# Patient Record
Sex: Female | Born: 1950
Health system: Southern US, Community
[De-identification: ages and names within clinical notes are randomized; demographics above are authoritative.]

## PROBLEM LIST (undated history)

## (undated) DIAGNOSIS — Z8619 Personal history of other infectious and parasitic diseases: Secondary | ICD-10-CM

## (undated) DIAGNOSIS — M1990 Unspecified osteoarthritis, unspecified site: Secondary | ICD-10-CM

## (undated) DIAGNOSIS — Z972 Presence of dental prosthetic device (complete) (partial): Secondary | ICD-10-CM

## (undated) DIAGNOSIS — J40 Bronchitis, not specified as acute or chronic: Secondary | ICD-10-CM

## (undated) DIAGNOSIS — F419 Anxiety disorder, unspecified: Secondary | ICD-10-CM

## (undated) DIAGNOSIS — R7982 Elevated C-reactive protein (CRP): Secondary | ICD-10-CM

## (undated) DIAGNOSIS — E785 Hyperlipidemia, unspecified: Secondary | ICD-10-CM

## (undated) DIAGNOSIS — I10 Essential (primary) hypertension: Secondary | ICD-10-CM

## (undated) DIAGNOSIS — M792 Neuralgia and neuritis, unspecified: Secondary | ICD-10-CM

## (undated) DIAGNOSIS — J45909 Unspecified asthma, uncomplicated: Secondary | ICD-10-CM

## (undated) DIAGNOSIS — B029 Zoster without complications: Secondary | ICD-10-CM

## (undated) DIAGNOSIS — K219 Gastro-esophageal reflux disease without esophagitis: Secondary | ICD-10-CM

## (undated) DIAGNOSIS — E119 Type 2 diabetes mellitus without complications: Secondary | ICD-10-CM

## (undated) HISTORY — DX: Elevated C-reactive protein (CRP): R79.82

## (undated) HISTORY — DX: Bronchitis, not specified as acute or chronic: J40

## (undated) HISTORY — PX: CHOLECYSTECTOMY: SHX55

## (undated) HISTORY — PX: ABDOMINAL HYSTERECTOMY: SHX81

---

## 2001-05-31 ENCOUNTER — Other Ambulatory Visit: Admission: RE | Admit: 2001-05-31 | Discharge: 2001-05-31 | Payer: Self-pay | Admitting: Family Medicine

## 2005-10-17 ENCOUNTER — Ambulatory Visit: Payer: Self-pay | Admitting: General Surgery

## 2005-10-17 DIAGNOSIS — Z86010 Personal history of colonic polyps: Secondary | ICD-10-CM | POA: Insufficient documentation

## 2005-10-30 ENCOUNTER — Ambulatory Visit: Payer: Self-pay | Admitting: Family Medicine

## 2006-10-08 ENCOUNTER — Other Ambulatory Visit: Payer: Self-pay

## 2006-10-08 ENCOUNTER — Emergency Department: Payer: Self-pay | Admitting: Unknown Physician Specialty

## 2007-05-03 ENCOUNTER — Ambulatory Visit: Payer: Self-pay | Admitting: Internal Medicine

## 2007-05-20 ENCOUNTER — Ambulatory Visit: Payer: Self-pay | Admitting: Family Medicine

## 2007-05-24 ENCOUNTER — Ambulatory Visit: Payer: Self-pay | Admitting: Emergency Medicine

## 2007-06-21 ENCOUNTER — Ambulatory Visit: Payer: Self-pay | Admitting: Internal Medicine

## 2007-08-02 ENCOUNTER — Ambulatory Visit: Payer: Self-pay | Admitting: Family Medicine

## 2008-01-01 ENCOUNTER — Emergency Department: Payer: Self-pay | Admitting: Emergency Medicine

## 2008-01-11 ENCOUNTER — Ambulatory Visit: Payer: Self-pay | Admitting: Family Medicine

## 2008-03-09 ENCOUNTER — Ambulatory Visit: Payer: Self-pay | Admitting: General Practice

## 2008-04-06 ENCOUNTER — Ambulatory Visit: Payer: Self-pay | Admitting: General Practice

## 2008-04-14 ENCOUNTER — Ambulatory Visit: Payer: Self-pay | Admitting: General Practice

## 2008-07-05 ENCOUNTER — Ambulatory Visit: Payer: Self-pay | Admitting: Family Medicine

## 2008-07-06 DIAGNOSIS — E7800 Pure hypercholesterolemia, unspecified: Secondary | ICD-10-CM | POA: Insufficient documentation

## 2008-07-27 ENCOUNTER — Ambulatory Visit: Payer: Self-pay | Admitting: Family Medicine

## 2008-11-14 ENCOUNTER — Ambulatory Visit: Payer: Self-pay | Admitting: Gastroenterology

## 2008-12-14 LAB — HM COLONOSCOPY

## 2009-02-07 ENCOUNTER — Ambulatory Visit: Payer: Self-pay | Admitting: Family Medicine

## 2009-09-10 ENCOUNTER — Ambulatory Visit: Payer: Self-pay | Admitting: Family Medicine

## 2010-08-13 ENCOUNTER — Emergency Department: Payer: Self-pay | Admitting: Emergency Medicine

## 2010-12-11 ENCOUNTER — Ambulatory Visit: Payer: Self-pay | Admitting: Family Medicine

## 2011-07-22 ENCOUNTER — Ambulatory Visit: Payer: Self-pay | Admitting: Family Medicine

## 2011-08-19 ENCOUNTER — Ambulatory Visit: Payer: Self-pay | Admitting: Internal Medicine

## 2012-01-05 ENCOUNTER — Emergency Department: Payer: Self-pay | Admitting: Emergency Medicine

## 2012-04-16 ENCOUNTER — Emergency Department: Payer: Self-pay | Admitting: Emergency Medicine

## 2012-04-16 ENCOUNTER — Ambulatory Visit: Payer: Self-pay | Admitting: Family Medicine

## 2012-06-16 HISTORY — PX: TOTAL KNEE ARTHROPLASTY: SHX125

## 2013-01-31 ENCOUNTER — Ambulatory Visit: Payer: Self-pay | Admitting: General Practice

## 2013-01-31 LAB — CBC
HGB: 13.5 g/dL (ref 12.0–16.0)
MCH: 26.8 pg (ref 26.0–34.0)
MCHC: 33.1 g/dL (ref 32.0–36.0)
Platelet: 238 10*3/uL (ref 150–440)
RBC: 5.05 10*6/uL (ref 3.80–5.20)
RDW: 14.4 % (ref 11.5–14.5)
WBC: 8.6 10*3/uL (ref 3.6–11.0)

## 2013-01-31 LAB — URINALYSIS, COMPLETE
Bacteria: NONE SEEN
Bilirubin,UR: NEGATIVE
Blood: NEGATIVE
Glucose,UR: NEGATIVE mg/dL (ref 0–75)
Nitrite: NEGATIVE
Ph: 6 (ref 4.5–8.0)
Protein: NEGATIVE
Specific Gravity: 1.019 (ref 1.003–1.030)
Squamous Epithelial: <1

## 2013-01-31 LAB — BASIC METABOLIC PANEL
BUN: 12 mg/dL (ref 7–18)
Chloride: 108 mmol/L — ABNORMAL HIGH (ref 98–107)
Creatinine: 0.77 mg/dL (ref 0.60–1.30)
EGFR (African American): >60
EGFR (Non-African Amer.): >60
Osmolality: 277 (ref 275–301)
Sodium: 139 mmol/L (ref 136–145)

## 2013-01-31 LAB — SEDIMENTATION RATE: Erythrocyte Sed Rate: 14 mm/hr (ref 0–30)

## 2013-02-01 LAB — URINE CULTURE

## 2013-02-10 ENCOUNTER — Ambulatory Visit: Payer: Self-pay | Admitting: Family Medicine

## 2013-02-16 ENCOUNTER — Inpatient Hospital Stay: Payer: Self-pay | Admitting: General Practice

## 2013-02-16 HISTORY — PX: TOTAL KNEE ARTHROPLASTY: SHX125

## 2013-02-17 LAB — BASIC METABOLIC PANEL
Anion Gap: 5 — ABNORMAL LOW (ref 7–16)
BUN: 9 mg/dL (ref 7–18)
Calcium, Total: 7.8 mg/dL — ABNORMAL LOW (ref 8.5–10.1)
Chloride: 100 mmol/L (ref 98–107)
Co2: 28 mmol/L (ref 21–32)
Creatinine: 0.89 mg/dL (ref 0.60–1.30)
EGFR (Non-African Amer.): >60
Osmolality: 267 (ref 275–301)
Potassium: 3 mmol/L — ABNORMAL LOW (ref 3.5–5.1)
Sodium: 133 mmol/L — ABNORMAL LOW (ref 136–145)

## 2013-02-17 LAB — HEMOGLOBIN: HGB: 10.5 g/dL — ABNORMAL LOW (ref 12.0–16.0)

## 2013-02-18 ENCOUNTER — Encounter: Payer: Self-pay | Admitting: Internal Medicine

## 2013-02-18 LAB — BASIC METABOLIC PANEL
Anion Gap: 4 — ABNORMAL LOW (ref 7–16)
Calcium, Total: 8.1 mg/dL — ABNORMAL LOW (ref 8.5–10.1)
Co2: 28 mmol/L (ref 21–32)
EGFR (African American): >60
Glucose: 109 mg/dL — ABNORMAL HIGH (ref 65–99)
Osmolality: 270 (ref 275–301)
Potassium: 3.6 mmol/L (ref 3.5–5.1)
Sodium: 136 mmol/L (ref 136–145)

## 2013-02-27 ENCOUNTER — Emergency Department: Payer: Self-pay | Admitting: Internal Medicine

## 2013-02-27 LAB — URINALYSIS, COMPLETE
Glucose,UR: NEGATIVE mg/dL (ref 0–75)
Leukocyte Esterase: NEGATIVE
Nitrite: NEGATIVE
Ph: 8 (ref 4.5–8.0)
RBC,UR: 1 /HPF (ref 0–5)
Specific Gravity: 1.005 (ref 1.003–1.030)
Squamous Epithelial: <1
WBC UR: <1 /HPF (ref 0–5)

## 2013-10-11 ENCOUNTER — Emergency Department: Payer: Self-pay | Admitting: Emergency Medicine

## 2013-10-11 LAB — COMPREHENSIVE METABOLIC PANEL
ALBUMIN: 3.3 g/dL — AB (ref 3.4–5.0)
ALT: 19 U/L (ref 12–78)
AST: 18 U/L (ref 15–37)
Alkaline Phosphatase: 78 U/L
Anion Gap: 5 — ABNORMAL LOW (ref 7–16)
BUN: 13 mg/dL (ref 7–18)
Bilirubin,Total: 0.3 mg/dL (ref 0.2–1.0)
CALCIUM: 8.7 mg/dL (ref 8.5–10.1)
CHLORIDE: 106 mmol/L (ref 98–107)
CO2: 28 mmol/L (ref 21–32)
Creatinine: 0.79 mg/dL (ref 0.60–1.30)
EGFR (African American): >60
EGFR (Non-African Amer.): >60
Glucose: 86 mg/dL (ref 65–99)
Osmolality: 277 (ref 275–301)
Potassium: 3.7 mmol/L (ref 3.5–5.1)
SODIUM: 139 mmol/L (ref 136–145)
Total Protein: 7.5 g/dL (ref 6.4–8.2)

## 2013-10-11 LAB — CK TOTAL AND CKMB (NOT AT ARMC)
CK, Total: 146 U/L
CK-MB: 1.3 ng/mL (ref 0.5–3.6)

## 2013-10-11 LAB — CBC
HCT: 40.1 % (ref 35.0–47.0)
HGB: 12.9 g/dL (ref 12.0–16.0)
MCH: 26.5 pg (ref 26.0–34.0)
MCHC: 32.2 g/dL (ref 32.0–36.0)
MCV: 82 fL (ref 80–100)
PLATELETS: 249 10*3/uL (ref 150–440)
RBC: 4.88 10*6/uL (ref 3.80–5.20)
RDW: 14.8 % — ABNORMAL HIGH (ref 11.5–14.5)
WBC: 10.2 10*3/uL (ref 3.6–11.0)

## 2013-10-11 LAB — TROPONIN I

## 2013-11-03 ENCOUNTER — Ambulatory Visit: Payer: Self-pay | Admitting: Family Medicine

## 2013-11-03 LAB — LIPID PANEL
CHOLESTEROL: 210 mg/dL — AB (ref 0–200)
HDL Cholesterol: 50 mg/dL (ref 40–60)
Ldl Cholesterol, Calc: 141 mg/dL — ABNORMAL HIGH (ref 0–100)
Triglycerides: 97 mg/dL (ref 0–200)
VLDL Cholesterol, Calc: 19 mg/dL (ref 5–40)

## 2013-11-03 LAB — COMPREHENSIVE METABOLIC PANEL
ALT: 17 U/L (ref 12–78)
Albumin: 3.4 g/dL (ref 3.4–5.0)
Alkaline Phosphatase: 87 U/L
Anion Gap: 10 (ref 7–16)
BILIRUBIN TOTAL: 0.4 mg/dL (ref 0.2–1.0)
BUN: 13 mg/dL (ref 7–18)
CHLORIDE: 101 mmol/L (ref 98–107)
CO2: 28 mmol/L (ref 21–32)
Calcium, Total: 9 mg/dL (ref 8.5–10.1)
Creatinine: 0.94 mg/dL (ref 0.60–1.30)
EGFR (African American): >60
EGFR (Non-African Amer.): >60
Glucose: 99 mg/dL (ref 65–99)
OSMOLALITY: 278 (ref 275–301)
POTASSIUM: 3.8 mmol/L (ref 3.5–5.1)
SGOT(AST): 14 U/L — ABNORMAL LOW (ref 15–37)
SODIUM: 139 mmol/L (ref 136–145)
Total Protein: 7.6 g/dL (ref 6.4–8.2)

## 2013-11-03 LAB — CK: CK, Total: 82 U/L

## 2013-11-15 ENCOUNTER — Ambulatory Visit: Payer: Self-pay | Admitting: Family Medicine

## 2013-11-17 ENCOUNTER — Ambulatory Visit: Payer: Self-pay | Admitting: Family Medicine

## 2013-12-21 DIAGNOSIS — B0222 Postherpetic trigeminal neuralgia: Secondary | ICD-10-CM | POA: Insufficient documentation

## 2013-12-29 ENCOUNTER — Ambulatory Visit: Payer: Self-pay | Admitting: Family Medicine

## 2014-01-06 ENCOUNTER — Ambulatory Visit: Payer: Self-pay | Admitting: Neurology

## 2014-02-08 DIAGNOSIS — R059 Cough, unspecified: Secondary | ICD-10-CM | POA: Diagnosis not present

## 2014-02-08 DIAGNOSIS — R0602 Shortness of breath: Secondary | ICD-10-CM | POA: Diagnosis not present

## 2014-02-08 DIAGNOSIS — R05 Cough: Secondary | ICD-10-CM | POA: Diagnosis not present

## 2014-02-08 DIAGNOSIS — F3289 Other specified depressive episodes: Secondary | ICD-10-CM | POA: Diagnosis not present

## 2014-02-08 DIAGNOSIS — I10 Essential (primary) hypertension: Secondary | ICD-10-CM | POA: Diagnosis not present

## 2014-02-08 DIAGNOSIS — Z79899 Other long term (current) drug therapy: Secondary | ICD-10-CM | POA: Diagnosis not present

## 2014-02-08 DIAGNOSIS — F329 Major depressive disorder, single episode, unspecified: Secondary | ICD-10-CM | POA: Diagnosis not present

## 2014-02-08 DIAGNOSIS — J329 Chronic sinusitis, unspecified: Secondary | ICD-10-CM | POA: Diagnosis not present

## 2014-02-08 DIAGNOSIS — R0982 Postnasal drip: Secondary | ICD-10-CM | POA: Diagnosis not present

## 2014-04-04 ENCOUNTER — Ambulatory Visit: Payer: Self-pay | Admitting: Family Medicine

## 2014-04-04 DIAGNOSIS — J45909 Unspecified asthma, uncomplicated: Secondary | ICD-10-CM | POA: Diagnosis not present

## 2014-04-04 DIAGNOSIS — R05 Cough: Secondary | ICD-10-CM | POA: Diagnosis not present

## 2014-04-04 DIAGNOSIS — J309 Allergic rhinitis, unspecified: Secondary | ICD-10-CM | POA: Diagnosis not present

## 2014-04-04 DIAGNOSIS — Z23 Encounter for immunization: Secondary | ICD-10-CM | POA: Diagnosis not present

## 2014-04-18 DIAGNOSIS — B0229 Other postherpetic nervous system involvement: Secondary | ICD-10-CM | POA: Diagnosis not present

## 2014-05-02 DIAGNOSIS — J301 Allergic rhinitis due to pollen: Secondary | ICD-10-CM | POA: Diagnosis not present

## 2014-05-02 DIAGNOSIS — J4540 Moderate persistent asthma, uncomplicated: Secondary | ICD-10-CM | POA: Diagnosis not present

## 2014-05-02 DIAGNOSIS — Z91013 Allergy to seafood: Secondary | ICD-10-CM | POA: Diagnosis not present

## 2014-05-02 DIAGNOSIS — R05 Cough: Secondary | ICD-10-CM | POA: Diagnosis not present

## 2014-05-02 DIAGNOSIS — K219 Gastro-esophageal reflux disease without esophagitis: Secondary | ICD-10-CM | POA: Diagnosis not present

## 2014-05-24 DIAGNOSIS — H2513 Age-related nuclear cataract, bilateral: Secondary | ICD-10-CM | POA: Diagnosis not present

## 2014-06-16 HISTORY — PX: BREAST BIOPSY: SHX20

## 2014-06-29 DIAGNOSIS — J45909 Unspecified asthma, uncomplicated: Secondary | ICD-10-CM | POA: Diagnosis not present

## 2014-06-29 DIAGNOSIS — F419 Anxiety disorder, unspecified: Secondary | ICD-10-CM | POA: Diagnosis not present

## 2014-06-29 DIAGNOSIS — E119 Type 2 diabetes mellitus without complications: Secondary | ICD-10-CM | POA: Diagnosis not present

## 2014-06-29 DIAGNOSIS — I10 Essential (primary) hypertension: Secondary | ICD-10-CM | POA: Diagnosis not present

## 2014-06-29 DIAGNOSIS — E780 Pure hypercholesterolemia: Secondary | ICD-10-CM | POA: Diagnosis not present

## 2014-07-05 DIAGNOSIS — I10 Essential (primary) hypertension: Secondary | ICD-10-CM | POA: Diagnosis not present

## 2014-07-05 DIAGNOSIS — E119 Type 2 diabetes mellitus without complications: Secondary | ICD-10-CM | POA: Diagnosis not present

## 2014-07-05 DIAGNOSIS — E780 Pure hypercholesterolemia: Secondary | ICD-10-CM | POA: Diagnosis not present

## 2014-07-12 ENCOUNTER — Ambulatory Visit: Payer: Self-pay | Admitting: Family Medicine

## 2014-07-12 DIAGNOSIS — R928 Other abnormal and inconclusive findings on diagnostic imaging of breast: Secondary | ICD-10-CM | POA: Diagnosis not present

## 2014-07-12 DIAGNOSIS — R921 Mammographic calcification found on diagnostic imaging of breast: Secondary | ICD-10-CM | POA: Diagnosis not present

## 2014-08-01 DIAGNOSIS — I10 Essential (primary) hypertension: Secondary | ICD-10-CM | POA: Diagnosis not present

## 2014-08-01 DIAGNOSIS — M1990 Unspecified osteoarthritis, unspecified site: Secondary | ICD-10-CM | POA: Diagnosis not present

## 2014-08-01 DIAGNOSIS — Z7951 Long term (current) use of inhaled steroids: Secondary | ICD-10-CM | POA: Diagnosis not present

## 2014-08-01 DIAGNOSIS — Z79899 Other long term (current) drug therapy: Secondary | ICD-10-CM | POA: Diagnosis not present

## 2014-08-01 DIAGNOSIS — R939 Diagnostic imaging inconclusive due to excess body fat of patient: Secondary | ICD-10-CM | POA: Diagnosis not present

## 2014-08-01 DIAGNOSIS — J45901 Unspecified asthma with (acute) exacerbation: Secondary | ICD-10-CM | POA: Diagnosis not present

## 2014-08-01 DIAGNOSIS — F329 Major depressive disorder, single episode, unspecified: Secondary | ICD-10-CM | POA: Diagnosis not present

## 2014-08-01 DIAGNOSIS — R0602 Shortness of breath: Secondary | ICD-10-CM | POA: Diagnosis not present

## 2014-08-29 DIAGNOSIS — E780 Pure hypercholesterolemia: Secondary | ICD-10-CM | POA: Diagnosis not present

## 2014-08-29 DIAGNOSIS — E119 Type 2 diabetes mellitus without complications: Secondary | ICD-10-CM | POA: Diagnosis not present

## 2014-08-29 DIAGNOSIS — I10 Essential (primary) hypertension: Secondary | ICD-10-CM | POA: Diagnosis not present

## 2014-09-19 ENCOUNTER — Ambulatory Visit
Admit: 2014-09-19 | Disposition: A | Discharge status: Home / Self Care | Payer: Self-pay | Attending: Family Medicine | Admitting: Family Medicine

## 2014-09-19 DIAGNOSIS — E119 Type 2 diabetes mellitus without complications: Secondary | ICD-10-CM | POA: Diagnosis not present

## 2014-09-29 DIAGNOSIS — H04123 Dry eye syndrome of bilateral lacrimal glands: Secondary | ICD-10-CM | POA: Diagnosis not present
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
## 2014-10-06 NOTE — Op Note (Signed)
PATIENT NAME:  [name], [name] MR#:  [id number] DATE OF BIRTH:  December 22, 1950  DATE OF PROCEDURE:  02/16/2013  PREOPERATIVE DIAGNOSIS: Degenerative arthrosis of the right knee.   POSTOPERATIVE DIAGNOSIS: Degenerative arthrosis of the right knee.   PROCEDURE PERFORMED: Right total knee arthroplasty using computer-assisted navigation.   SURGEON: Laurice Record. Hooten, M.D.   ASSISTANT: Vance Peper, PA (required to maintain retraction throughout the procedure).   ANESTHESIA: Spinal.   ESTIMATED BLOOD LOSS: 50 mL.   FLUIDS REPLACED: 2000 mL of crystalloid.   TOURNIQUET TIME: 95 minutes.   DRAINS: Two medium drains to reinfusion system.   SOFT TISSUE RELEASES: Anterior cruciate ligament, posterior cruciate ligament, deep medial collateral ligament, and patellofemoral ligament, posterolateral corner.   IMPLANTS UTILIZED: DePuy PFC Sigma size 2.5 posterior stabilized femoral component (cemented) size 2.5 MBT tibial component (cemented), 32 mm 3-peg oval dome patella (cemented), and a 10 mm stabilized rotating platform polyethylene insert. Gentamicin cement was utilized.   INDICATIONS FOR SURGERY: The patient is a 64 year old female who has been seen for complaints of progressive right knee pain. X-rays demonstrated severe degenerative changes in tricompartmental fashion. After discussion of the risks and benefits of surgical intervention, the patient expressed understanding of the risks and benefits and agreed with plans for surgical intervention.   PROCEDURE IN DETAIL: The patient was brought into the operating room and, after adequate spinal anesthesia was achieved, a tourniquet was placed on the patient's upper right thigh. The patient's right knee and leg were cleaned and prepped with alcohol and DuraPrep, draped in the usual sterile fashion. A "timeout" was performed as per usual protocol. The right lower extremity was exsanguinated using an Esmarch, and the tourniquet was inflated to 300 mmHg.   An  anterior longitudinal incision was made followed by a standard mid vastus approach. A moderate effusion was evacuated. The deep fibers of the medial collateral ligament were elevated in a subperiosteal fashion off the medial flare of the tibia so as to maintain a continuous soft tissue sleeve. The patella was subluxed laterally and the patellofemoral ligament was incised. Inspection of the knee demonstrated severe degenerative changes in tricompartmental fashion. Prominent osteophytes were debrided using a rongeur. Anterior and posterior cruciate ligaments were excised. Two 4.0 mm Schanz pins were inserted into the femur and into the tibia for attachment of the array of trackers used for computer-assisted navigation.   The epicenter was identified using circumduction technique. Distal landmarks were mapped using the computer. The distal femur and proximal tibia were mapped using the computer. The distal femoral cutting guide was positioned using computer-assisted navigation so as to achieve a 5-degree distal valgus cut. Cut was performed and verified using the computer. Distal femur was sized and it was felt that a size 2.5 femoral component was appropriate. A size 2.5 cutting guide was positioned and the anterior cut was performed and verified using the computer. This was followed by completion of the posterior and chamfer cuts. Femoral cutting guide for a central box was then positioned and the central box cut was performed.   Attention was then directed to the proximal tibia. Medial and lateral menisci were excised. The extramedullary tibial cutting guide was positioned using computer-assisted navigation so as to achieve a 0-degree varus valgus and 0-degree posterior slope. The cut was performed and verified using the computer. The proximal tibia was sized and it was felt that a size 2.5 tibial tray was appropriate. Tibial and femoral trials were inserted followed by the insertion of a 10  mm polyethylene  insert. The knee was felt to be tight laterally. Trial components were removed and the knee was brought in full extension and distracted using Moreland retractors. The posterolateral corner was carefully released using a combination of electrocautery and Metzenbaum scissors. Trial components were reinserted with good medial and lateral soft-tissue balancingappreciated in full extension and in flexion.   Finally, the patella was cut and prepared so as to accommodate a 32 mm 3-peg oval dome patella. Patellar trial was placed and the knee was placed through a range of motion with excellent patellar tracking noted.   Femoral trial was removed after debridement of posterior osteophytes. Central post hole for the tibial component was reamed followed by the insertion of a keel punch. Tibial trials were  removed. Bone was irrigated with copious amounts of normal saline with antibiotic solution using pulsatile lavage and then suctioned dry. Polymethyl methacrylate cement with gentamicin was prepared in the usual fashion using a vacuum mixer. Cement was applied to the cut surface of the proximal tibia as well as along the undersurface of a size 2.5 MBT tibial component. The tibial component positioned and impacted into place. Excess cement was removed using freer elevators. Cement was then applied to the cut surface of the femur as well as along the posterior flanges of a size 2.5 posterior stabilized femoral component. Femoral component was positioned and impacted into place. Excess cement was removed using freer elevators. A 10 mm polyethylene trial was inserted and the knee was brought in full extension with steady axial compression applied.   Finally, cement was applied to the backside of a 32 mm 3-peg oval dome patella and the patellar component was positioned and patellar clamp applied. Excess cement was removed using freer elevators.   After adequate curing of the cement, the tourniquet was deflated after total  tourniquet time of 95 minutes. Hemostasis was achieved using electrocautery. The knee was irrigated with copious amounts of normal saline with antibiotic solution using pulsatile lavage and then suctioned dry. The knee was inspected for any residual cement debris. Then 20 mL of 1.3% Exparel in 40 mL of normal saline was injected along the posterior recess and along the medial and lateral gutters  as well as along the arthrotomy site. A 10 mm stabilized rotating platform polyethylene insert was inserted and the knee was placed through a range of motion. Excellent mediolateral soft-tissue balancing was appreciated and excellent patellar tracking appreciated.   Two medium drains were placed in the wound bed and brought out through a separate stab incision and reattached through a reinfusion system. The medial parapatellar portion of the incision was reapproximated using interrupted sutures of #1 Vicryl. The subcutaneous tissue was approximated in layers using first #0 Vicryl followed by 2-0 Vicryl. The subcutaneous tissue was injected with a total of 30 mL of 0.25% Marcaine with epinephrine. The skin was closed with skin staples. A sterile dressing was applied.   The patient tolerated the procedure well. She was transported to the recovery room in stable condition.      ____________________________ Laurice Record. Holley Bouche., MD jph:np D: 02/16/2013 15:26:35 ET T: 02/16/2013 15:59:34 ET JOB#: [id number]  cc: Jeneen Rinks P. Holley Bouche., MD, <Dictator> JAMES P Holley Bouche MD ELECTRONICALLY SIGNED 02/23/2013 7:17

## 2014-10-17 ENCOUNTER — Encounter: Payer: Self-pay | Admitting: Dietician

## 2014-10-17 ENCOUNTER — Encounter: Payer: Medicare Other | Attending: Family Medicine | Admitting: Dietician

## 2014-10-17 VITALS — Ht 63.0 in | Wt 219.8 lb

## 2014-10-17 DIAGNOSIS — E119 Type 2 diabetes mellitus without complications: Secondary | ICD-10-CM | POA: Insufficient documentation

## 2014-10-17 NOTE — Progress Notes (Signed)
Class 1 Diabetes Overview - define DM; state own type of DM; identify functions of pancreas and insulin; define insulin deficiency vs insulin resistance  Psychosocial - identify DM as a source of stress; state the effects of stress on BG control;  Nutritional Management - describe effects of food on blood glucose; identify sources of carbohydrate, protein and fat; verbalize the importance of balance meals in controlling blood glucose; identify meals as well balanced or not; estimate servings of carbohydrate from menus; use food labels to identify servings size, content of carbohydrate, fiber, protein, fat, saturated fat and sodium; recognize food sources of fat, saturated fat, trans fat, sodium and verbalize goals for intake; describe healthful appropriate food choices when dining out   Exercise - describe the effects of exercise on blood glucose and importance of regular exercise in controlling diabetes; state a plan for personal exercise; verbalize contraindications for exercise  Self-Monitoring - state importance of HBGM and demo procedure accurately; use HBGM results to effectively manage diabetes; identify importance of regular HbA1C testing and goals for results  Acute Complications/Sick Day Guidelines - recognize hyperglycemia and hypoglycemia with causes and effects; identify blood glucose results as high, low or in control; list steps in treating and preventing high and low blood glucose; state appropriate measure to manage blood glucose when ill (need for meds, HBGM plan, when to call physician, need for fluids)  Chronic Complications/Foot, Skin, Eye Dental Care - identify possible long-term complications of diabetes (retinopathy, neuropathy, nephropathy, cardiovascular disease, infections); explain steps in prevention and treatment of chronic complications; state importance of daily self-foot exams; describe how to examine feet and what to look for; explain appropriate eye and dental  care  Lifestyle Changes/Goals & Health/Community Resources - state benefits of making appropriate lifestyle changes; identify habits that need to change (meals, tobacco, alcohol); identify strategies to reduce risk factors for personal health; set goals for proper diabetes care; state need for and frequency of healthcare follow-up; describe appropriate community resources for good health (ADA, web sites, apps)   Pregnancy/Sexual Health - define gestational diabetes; state importance of good blood glucose control and birth control prior to pregnancy; state importance of good blood glucose control in preventing sexual problems (impotence, vaginal dryness, infections, loss of desire); state relationship of blood glucose control and pregnancy outcome; describe risk of maternal and fetal complications  Teaching Materials Used: Class 1 Slides/Notebook Diabetes Booklet ID Card  Medic Alert/Medic ID Forms Sleep Evaluation Planning a Balanced Meal Goals for Class 1

## 2014-10-24 ENCOUNTER — Encounter: Payer: Medicare Other | Admitting: *Deleted

## 2014-10-24 VITALS — Wt 221.6 lb

## 2014-10-24 DIAGNOSIS — E119 Type 2 diabetes mellitus without complications: Secondary | ICD-10-CM | POA: Diagnosis not present

## 2014-10-24 NOTE — Progress Notes (Signed)
Appt. Start Time: 0900 Appt. End Time: 1045  [place] Class 2 Diabetes Overview - define DM; state own type of DM; identify functions of pancreas and insulin; define insulin deficiency vs insulin resistance  Psychosocial - identify DM as a source of stress; state the effects of stress on BG control; verbalize appropriate stress management techniques; identify personal stress issues   Nutritional Management - describe effects of food on blood glucose; identify sources of carbohydrate, protein and fat; verbalize the importance of balance meals in controlling blood glucose; identify meals as well balanced or not; estimate servings of carbohydrate from menus; use food labels to identify servings size, content of carbohydrate, fiber, protein, fat, saturated fat and sodium; recognize food sources of fat, saturated fat, trans fat, sodium and verbalize goals for intake; describe healthful appropriate food choices when dining out   Exercise - describe the effects of exercise on blood glucose and importance of regular exercise in controlling diabetes; state a plan for personal exercise; verbalize contraindications for exercise  Medications - state name, dose, timing of currently prescribed medications; describe types of medications available for diabetes  Self-Monitoring - state importance of HBGM and demo procedure accurately; use HBGM results to effectively manage diabetes; identify importance of regular HbA1C testing and goals for results  Acute Complications/Sick Day Guidelines - recognize hyperglycemia and hypoglycemia with causes and effects; identify blood glucose results as high, low or in control; list steps in treating and preventing high and low blood glucose; state appropriate measure to manage blood glucose when ill (need for meds, HBGM plan, when to call physician, need for fluids)  Lifestyle Changes/Goals & Health/Community Resources - state benefits of making appropriate lifestyle changes;  identify habits that need to change (meals, tobacco, alcohol); identify strategies to reduce risk factors for personal health; set goals for proper diabetes care; state need for and frequency of healthcare follow-up; describe appropriate community resources for good health (ADA, web sites, apps)   Network engineer Used: Class 2 Slide Packet Exercise Handout Daily Food Record and Menu Ideas Goals for Class 2

## 2014-10-31 ENCOUNTER — Encounter: Payer: Self-pay | Admitting: Family Medicine

## 2014-10-31 ENCOUNTER — Ambulatory Visit
Admission: EM | Admit: 2014-10-31 | Discharge: 2014-10-31 | Disposition: A | Discharge status: Home / Self Care | Payer: Medicare Other | Attending: Family Medicine | Admitting: Family Medicine

## 2014-10-31 ENCOUNTER — Encounter: Payer: Medicare Other | Admitting: *Deleted

## 2014-10-31 ENCOUNTER — Encounter: Payer: Self-pay | Admitting: *Deleted

## 2014-10-31 VITALS — Wt 222.6 lb

## 2014-10-31 DIAGNOSIS — M65311 Trigger thumb, right thumb: Secondary | ICD-10-CM

## 2014-10-31 DIAGNOSIS — E119 Type 2 diabetes mellitus without complications: Secondary | ICD-10-CM | POA: Diagnosis not present

## 2014-10-31 HISTORY — DX: Essential (primary) hypertension: I10

## 2014-10-31 HISTORY — DX: Unspecified asthma, uncomplicated: J45.909

## 2014-10-31 HISTORY — DX: Type 2 diabetes mellitus without complications: E11.9

## 2014-10-31 HISTORY — DX: Hyperlipidemia, unspecified: E78.5

## 2014-10-31 MED ORDER — MELOXICAM 7.5 MG PO TABS: 7.5000 mg | ORAL_TABLET | Freq: Every day | ORAL | Status: DC

## 2014-10-31 NOTE — ED Provider Notes (Signed)
CSN: [id number]     Arrival date & time 10/31/14  1032 History   First MD Initiated Contact with Patient 10/31/14 1158     Chief Complaint  Patient presents with  . Hand Pain    right thumb- 2 months   (Consider location/radiation/quality/duration/timing/severity/associated sxs/prior Treatment) HPI this 64 year old diabetic female who presents with a two-month history of right thumb pain. She states that on occasion she will have her thumb gets stuck in a flexed position she has to shake her hand and manipulated in order for it to straighten out but this is very painful. She perceives most of the pain in the palm but also over the IP joint. There is no numbness or tingling. No other fingers are involved.  Past Medical History  Diagnosis Date  . Asthma   . Hypertension   . Diabetes mellitus without complication   . Hyperlipidemia    Past Surgical History  Procedure Laterality Date  . Total knee arthroplasty Right 2014  . Abdominal hysterectomy     Family History  Problem Relation Age of Onset  . Anuerysm Mother     Brain   History  Substance Use Topics  . Smoking status: Never Smoker   . Smokeless tobacco: Not on file  . Alcohol Use: No   OB History    Gravida Para Term Preterm AB TAB SAB Ectopic Multiple Living   2 2             Review of Systems  All other systems reviewed and are negative.   Allergies  Lisinopril and Lovastatin  Home Medications   Prior to Admission medications   Medication Sig Start Date End Date Taking? Authorizing Provider  amLODipine (NORVASC) 5 MG tablet Take 5 mg by mouth daily.    Yes Historical Provider, MD  aspirin EC 81 MG tablet Take 81 mg by mouth daily.    Yes Historical Provider, MD  gabapentin (NEURONTIN) 300 MG capsule Take 900 mg by mouth 3 (three) times daily.    Yes Historical Provider, MD  hydrochlorothiazide (MICROZIDE) 12.5 MG capsule Take 12.5 mg by mouth at bedtime.    Yes Historical Provider, MD  lansoprazole (PREVACID)  30 MG capsule Take 30 mg by mouth daily at 12 noon.   Yes Historical Provider, MD  metFORMIN (GLUCOPHAGE) 500 MG tablet Take 500 mg by mouth daily with breakfast.    Yes Historical Provider, MD  montelukast (SINGULAIR) 10 MG tablet Take 10 mg by mouth at bedtime.   Yes Historical Provider, MD  OLOPATADINE HCL NA Place 665 mcg into the nose 2 (two) times daily.   Yes Historical Provider, MD  pravastatin (PRAVACHOL) 20 MG tablet Take 20 mg by mouth at bedtime.  11/23/13  Yes Historical Provider, MD  albuterol (PROVENTIL HFA;VENTOLIN HFA) 108 (90 BASE) MCG/ACT inhaler Inhale 2 puffs into the lungs every 6 (six) hours as needed.  08/01/14 10/30/14  Historical Provider, MD  DULoxetine (CYMBALTA) 30 MG capsule Take 30 mg by mouth at bedtime.     Historical Provider, MD  Fluticasone Furoate-Vilanterol 200-25 MCG/INH AEPB Inhale 1 puff into the lungs daily.    Historical Provider, MD  meloxicam (MOBIC) 7.5 MG tablet Take 1 tablet (7.5 mg total) by mouth daily. 10/31/14   Lorin Picket, PA-C   BP 135/76 mmHg  Pulse 81  Temp(Src) 97.2 F (36.2 C) (Oral)  Resp 18  Ht 5\' 3"  (1.6 m)  Wt 220 lb (99.791 kg)  BMI 38.98 kg/m2  SpO2  99% Physical Exam  Constitutional: She appears well-developed and well-nourished.  HENT:  Head: Normocephalic and atraumatic.  Eyes: Pupils are equal, round, and reactive to light.  Musculoskeletal:  Examination of the right hand shows the patient to be reluctant to move the thumb on a very limited range of motion. There is tenderness palpation in the palm at the base of the thumb. There is palpable hard swelling of the flexor tendon with IP joint motion. Triggering is accomplished with flexion of the IP joint and released in extension. Sensation is intact to light touch.    ED Course  Procedures (including critical care time) Labs Review Labs Reviewed - No data to display  Imaging Review No results found.   MDM   1. Trigger thumb of right hand    I discussed my  findings with the patient in detail. I will place her on a.m. small dose of anti-inflammatory to have help assist with the pain. I recommended that she be seen by hand surgeon for release of the trigger thumb. I have also advised her that hit at his discretion she may opt for  an injection first but that a surgical release is the definitive treatment. I recommend that she make an appointment today to see Dr. Tamala Julian at [facility].    Lorin Picket, PA-C 10/31/14 1231

## 2014-10-31 NOTE — Progress Notes (Signed)
Appt. Start Time: 0900 Appt. End Time: 42  [place] Class 3 Psychosocial - identify DM as a source of stress; state the effects of stress on BG control; verbalize appropriate stress management techniques; identify personal stress issues   Exercise - describe the effects of exercise on blood glucose and importance of regular exercise in controlling diabetes; state a plan for personal exercise; verbalize contraindications for exercise  Self-Monitoring - state importance of HBGM and demo procedure accurately; use HBGM results to effectively manage diabetes; identify importance of regular HbA1C testing and goals for results  Acute Complications/Sick Day Guidelines - recognize hyperglycemia and hypoglycemia with causes and effects; identify blood glucose results as high, low or in control; list steps in treating and preventing high and low blood glucose; state appropriate measure to manage blood glucose when ill (need for meds, HBGM plan, when to call physician, need for fluids)  Chronic Complications/Foot, Skin, Eye Dental Care - identify possible long-term complications of diabetes (retinopathy, neuropathy, nephropathy, cardiovascular disease, infections); explain steps in prevention and treatment of chronic complications; state importance of daily self-foot exams; describe how to examine feet and what to look for; explain appropriate eye and dental care  Lifestyle Changes/Goals & Health/Community Resources - state benefits of making appropriate lifestyle changes; identify habits that need to change (meals, tobacco, alcohol); identify strategies to reduce risk factors for personal health; set goals for proper diabetes care; state need for and frequency of healthcare follow-up; describe appropriate community resources for good health (ADA, web sites, apps)   Pregnancy/Sexual Health - define gestational diabetes; state importance of good blood glucose control and birth control prior to pregnancy; state  importance of good blood glucose control in preventing sexual problems (impotence, vaginal dryness, infections, loss of desire); state relationship of blood glucose control and pregnancy outcome; describe risk of maternal and fetal complications  Teaching Materials Used: Class 3 Slide Packet  Hgb A1C Handout Foot Care Handout Diabetes Stress Test Stress Management Tools Goals for Class 3

## 2014-10-31 NOTE — Discharge Instructions (Signed)
Trigger Finger Trigger finger (digital tendinitis and stenosing tenosynovitis) is a common disorder that causes an often painful catching of the fingers or thumb. It occurs as a clicking, snapping, or locking of a finger in the palm of the hand. This is caused by a problem with the tendons that flex or bend the fingers sliding smoothly through their sheaths. The condition may occur in any finger or a couple fingers at the same time.  The finger may lock with the finger curled or suddenly straighten out with a snap. This is more common in patients with rheumatoid arthritis and diabetes. Left untreated, the condition may get worse to the point where the finger becomes locked in flexion, like making a fist, or less commonly locked with the finger straightened out. CAUSES   Inflammation and scarring that lead to swelling around the tendon sheath.  Repeated or forceful movements.  Rheumatoid arthritis, an autoimmune disease that affects joints.  Gout.  Diabetes mellitus. SIGNS AND SYMPTOMS  Soreness and swelling of your finger.  A painful clicking or snapping as you bend and straighten your finger. DIAGNOSIS  Your health care provider will do a physical exam of your finger to diagnose trigger finger. TREATMENT   Splinting for 6-8 weeks may be helpful.  Nonsteroidal anti-inflammatory medicines (NSAIDs) can help to relieve the pain and inflammation.  Cortisone injections, along with splinting, may speed up recovery. Several injections may be required. Cortisone may give relief after one injection.  Surgery is another treatment that may be used if conservative treatments do not work. Surgery can be minor, without incisions (a cut does not have to be made), and can be done with a needle through the skin.  Other surgical choices involve an open procedure in which the surgeon opens the hand through a small incision and cuts the pulley so the tendon can again slide smoothly. Your hand will still  work fine. HOME CARE INSTRUCTIONS  Apply ice to the injured area, twice per day:  Put ice in a plastic bag.  Place a towel between your skin and the bag.  Leave the ice on for 20 minutes, 3-4 times a day.  Rest your hand often. MAKE SURE YOU:   Understand these instructions.  Will watch your condition.  Will get help right away if you are not doing well or get worse. Document Released: 03/22/2004 Document Revised: 02/02/2013 Document Reviewed: 11/02/2012 ExitCare Patient Information 2015 [employer]. This information is not intended to replace advice given to you by your health care provider. Make sure you discuss any questions you have with your health care provider.  

## 2014-10-31 NOTE — ED Notes (Signed)
Patient states her right thumb has been bothering her for 2 months. She states it will pop out of joint and back in and then it will be painful. The thumb seems swollen and feels like there is a knot in the joint.

## 2014-11-03 DIAGNOSIS — I10 Essential (primary) hypertension: Secondary | ICD-10-CM | POA: Diagnosis not present

## 2014-11-03 DIAGNOSIS — B0229 Other postherpetic nervous system involvement: Secondary | ICD-10-CM | POA: Diagnosis not present

## 2014-11-03 DIAGNOSIS — E119 Type 2 diabetes mellitus without complications: Secondary | ICD-10-CM | POA: Diagnosis not present

## 2014-11-03 DIAGNOSIS — E559 Vitamin D deficiency, unspecified: Secondary | ICD-10-CM | POA: Diagnosis not present

## 2014-11-03 DIAGNOSIS — E780 Pure hypercholesterolemia: Secondary | ICD-10-CM | POA: Diagnosis not present

## 2014-11-09 ENCOUNTER — Encounter: Payer: Medicare Other | Admitting: Dietician

## 2014-11-09 ENCOUNTER — Encounter: Payer: Self-pay | Admitting: Dietician

## 2014-11-09 VITALS — BP 152/98 | Ht 63.0 in | Wt 224.3 lb

## 2014-11-09 DIAGNOSIS — E119 Type 2 diabetes mellitus without complications: Secondary | ICD-10-CM

## 2014-11-09 NOTE — Progress Notes (Signed)
Appt. Start Time: 10:00 Appt. End Time: 11:30am  Class 3 Psychosocial - identify DM as a source of stress; state the effects of stress on BG control; verbalize appropriate stress management techniques; identify personal stress issues   Nutritional Management - describe effects of food on blood glucose; identify sources of carbohydrate, protein and fat; verbalize the importance of balance meals in controlling blood glucose; identify meals as well balanced or not; estimate servings of carbohydrate from menus; use food labels to identify servings size, content of carbohydrate, fiber, protein, fat, saturated fat and sodium; recognize food sources of fat, saturated fat, trans fat, sodium and verbalize goals for intake; describe healthful appropriate food choices when dining out   Exercise - describe the effects of exercise on blood glucose and importance of regular exercise in controlling diabetes; state a plan for personal exercise; verbalize contraindications for exercise  Self-Monitoring - state importance of HBGM and demo procedure accurately; use HBGM results to effectively manage diabetes; identify importance of regular HbA1C testing and goals for results  Acute Complications/Sick Day Guidelines - recognize hyperglycemia and hypoglycemia with causes and effects; identify blood glucose results as high, low or in control; list steps in treating and preventing high and low blood glucose; state appropriate measure to manage blood glucose when ill (need for meds, HBGM plan, when to call physician, need for fluids)  Chronic Complications/Foot, Skin, Eye Dental Care - identify possible long-term complications of diabetes (retinopathy, neuropathy, nephropathy, cardiovascular disease, infections); explain steps in prevention and treatment of chronic complications; state importance of daily self-foot exams; describe how to examine feet and what to look for; explain appropriate eye and dental care  Lifestyle  Changes/Goals & Health/Community Resources - state benefits of making appropriate lifestyle changes; identify habits that need to change (meals, tobacco, alcohol); identify strategies to reduce risk factors for personal health; set goals for proper diabetes care; state need for and frequency of healthcare follow-up; describe appropriate community resources for good health (ADA, web sites, apps)   Teaching Materials Used: Class 3 Slide Packet Diabetes Stress Test Stress Management Tools Stress Poem Recipe/Menu Booklet Nutrition Prescription Fast Food Information Goal Setting Worksheet

## 2014-11-20 ENCOUNTER — Encounter: Payer: Self-pay | Admitting: *Deleted

## 2014-11-22 ENCOUNTER — Other Ambulatory Visit: Payer: Self-pay | Admitting: Family Medicine

## 2014-11-22 DIAGNOSIS — B0229 Other postherpetic nervous system involvement: Secondary | ICD-10-CM | POA: Insufficient documentation

## 2015-01-10 DIAGNOSIS — F329 Major depressive disorder, single episode, unspecified: Secondary | ICD-10-CM | POA: Insufficient documentation

## 2015-01-10 DIAGNOSIS — F32A Depression, unspecified: Secondary | ICD-10-CM | POA: Insufficient documentation

## 2015-01-11 ENCOUNTER — Other Ambulatory Visit: Payer: Self-pay

## 2015-01-11 DIAGNOSIS — E119 Type 2 diabetes mellitus without complications: Secondary | ICD-10-CM | POA: Insufficient documentation

## 2015-01-11 DIAGNOSIS — H5319 Other subjective visual disturbances: Secondary | ICD-10-CM | POA: Insufficient documentation

## 2015-01-11 DIAGNOSIS — F419 Anxiety disorder, unspecified: Secondary | ICD-10-CM | POA: Insufficient documentation

## 2015-01-11 DIAGNOSIS — E509 Vitamin A deficiency, unspecified: Secondary | ICD-10-CM | POA: Insufficient documentation

## 2015-01-11 DIAGNOSIS — H04123 Dry eye syndrome of bilateral lacrimal glands: Secondary | ICD-10-CM | POA: Diagnosis not present

## 2015-01-15 ENCOUNTER — Ambulatory Visit (INDEPENDENT_AMBULATORY_CARE_PROVIDER_SITE_OTHER): Payer: Medicare Other | Admitting: Family Medicine

## 2015-01-15 ENCOUNTER — Other Ambulatory Visit: Payer: Self-pay

## 2015-01-15 ENCOUNTER — Encounter: Payer: Self-pay | Admitting: Family Medicine

## 2015-01-15 VITALS — BP 125/80 | HR 70 | Temp 97.9°F | Resp 16 | Wt 229.8 lb

## 2015-01-15 DIAGNOSIS — E119 Type 2 diabetes mellitus without complications: Secondary | ICD-10-CM

## 2015-01-15 DIAGNOSIS — I10 Essential (primary) hypertension: Secondary | ICD-10-CM

## 2015-01-15 DIAGNOSIS — E559 Vitamin D deficiency, unspecified: Secondary | ICD-10-CM | POA: Diagnosis not present

## 2015-01-15 DIAGNOSIS — E7800 Pure hypercholesterolemia, unspecified: Secondary | ICD-10-CM

## 2015-01-15 NOTE — Progress Notes (Signed)
Patient: [name] Female    DOB: 1950-09-21   64 y.o.   MRN: [id number] Visit Date: 01/15/2015  Today's Provider: Vernie Murders, PA   Chief Complaint  Patient presents with  . Follow-up    Diabetes   Subjective:    Diabetes She presents for her follow-up diabetic visit. She has type 2 diabetes mellitus. Her disease course has been stable. Pertinent negatives for hypoglycemia include no dizziness, headaches, mood changes, nervousness/anxiousness, sleepiness, sweats or tremors. Pertinent negatives for diabetes include no blurred vision, no visual change, no weakness and no weight loss. Symptoms are stable.  Last office visit was 11/06/14. Last Hgb A1C was stable. Blood sugar, cholesterol, liver and kidney test were normal. Vitamin D level was low and patient was recomended to take Vitamin D 1000 unit BID and recheck levels in 3 months.    Past Medical History  Diagnosis Date  . Asthma   . Hypertension   . Diabetes mellitus without complication   . Hyperlipidemia    Past Surgical History  Procedure Laterality Date  . Total knee arthroplasty Right 2014  . Abdominal hysterectomy    . Total knee arthroplasty Right 02/16/2013   Family History  Problem Relation Age of Onset  . Anuerysm Mother     Brain  . Dementia Mother   . Heart failure Father   . CAD Father   . Dementia Father   . Diabetes Father   . Lupus Sister   . Diabetes Brother   . Throat cancer Maternal Grandfather     Allergies  Allergen Reactions  . Lisinopril Cough  . Lovastatin Other (See Comments)    Pain   Current Outpatient Prescriptions on File Prior to Visit  Medication Sig Dispense Refill  . amLODipine (NORVASC) 5 MG tablet Take 5 mg by mouth daily.     Marland Kitchen aspirin EC 81 MG tablet Take 81 mg by mouth daily.     . Cholecalciferol (VITAMIN D) 2000 UNITS tablet Take 2,000 Units by mouth daily.    . Fluticasone Furoate-Vilanterol 200-25 MCG/INH AEPB Inhale 1 puff into the lungs daily.    Marland Kitchen  gabapentin (NEURONTIN) 300 MG capsule take 3 capsule by mouth every 8 hours 90 capsule 3  . hydrochlorothiazide (MICROZIDE) 12.5 MG capsule Take 12.5 mg by mouth at bedtime.     . metFORMIN (GLUCOPHAGE) 500 MG tablet Take 500 mg by mouth daily with breakfast.     . montelukast (SINGULAIR) 10 MG tablet Take 10 mg by mouth at bedtime.    . pravastatin (PRAVACHOL) 20 MG tablet Take 20 mg by mouth at bedtime.     Marland Kitchen albuterol (PROVENTIL HFA;VENTOLIN HFA) 108 (90 BASE) MCG/ACT inhaler Inhale 2 puffs into the lungs every 6 (six) hours as needed.     . Omega-3 Fatty Acids (FISH OIL EXTRA STRENGTH) 1200 MG CAPS Take 2 capsules by mouth 2 (two) times daily.     No current facility-administered medications on file prior to visit.     Review of Systems  Constitutional: Negative.  Negative for weight loss.  HENT: Negative.   Eyes: Negative.  Negative for blurred vision.  Respiratory: Negative.   Cardiovascular: Negative.   Gastrointestinal: Negative.   Endocrine: Negative.   Genitourinary: Negative.   Musculoskeletal: Negative.   Skin: Negative.   Allergic/Immunologic: Negative.   Neurological: Negative.  Negative for dizziness, tremors, weakness and headaches.  Hematological: Negative.   Psychiatric/Behavioral: Negative.  The patient is not nervous/anxious.  History  Substance Use Topics  . Smoking status: Never Smoker   . Smokeless tobacco: Never Used  . Alcohol Use: No   Objective:   BP 125/80 mmHg  Pulse 70  Temp(Src) 97.9 F (36.6 C) (Oral)  Resp 16  Wt 229 lb 12.8 oz (104.237 kg)  Physical Exam  Constitutional: She is oriented to person, place, and time. She appears well-developed and well-nourished. No distress.  HENT:  Head: Normocephalic and atraumatic.  Right Ear: Hearing normal.  Left Ear: Hearing normal.  Nose: Nose normal.  Eyes: Conjunctivae, EOM and lids are normal. Right eye exhibits no discharge. Left eye exhibits no discharge. No scleral icterus.  Neck:  Normal range of motion. Neck supple.  Cardiovascular: Normal rate and regular rhythm.   Pulmonary/Chest: Effort normal. No respiratory distress.  Abdominal: Soft. Bowel sounds are normal.  Musculoskeletal: Normal range of motion.  Neurological: She is alert and oriented to person, place, and time.  Skin: Skin is intact. No lesion and no rash noted.  Psychiatric: She has a normal mood and affect. Her speech is normal and behavior is normal. Thought content normal.      Assessment & Plan:      1. Type 2 diabetes mellitus without complication Stable with some weight gain (9 lbs in the last 3 months). Still taking the Metformin 500 mg qd. Will recheck labs and follow up pending lab reports. - Hemoglobin A1c - CBC with Differential/Platelet - Comprehensive metabolic panel  2. Benign essential HTN Stable and tolerating amlodipine with HCTZ without side effects. Will recheck labs and follow up in 3 months.  3. Hypercholesterolemia without hypertriglyceridemia Still taping Pravastatin 20 mg qd. Good compliance without side effects. Recheck lipids and TSH. - Lipid panel - TSH  4. Avitaminosis D Energy level down and still taking supplement daily       Vernie Murders, PA  [facility]

## 2015-01-17 DIAGNOSIS — E780 Pure hypercholesterolemia: Secondary | ICD-10-CM | POA: Diagnosis not present

## 2015-01-17 DIAGNOSIS — E119 Type 2 diabetes mellitus without complications: Secondary | ICD-10-CM | POA: Diagnosis not present

## 2015-01-18 LAB — COMPREHENSIVE METABOLIC PANEL
ALBUMIN: 4 g/dL (ref 3.6–4.8)
ALT: 13 IU/L (ref 0–32)
AST: 9 IU/L (ref 0–40)
Albumin/Globulin Ratio: 1.4 (ref 1.1–2.5)
Alkaline Phosphatase: 84 IU/L (ref 39–117)
BUN/Creatinine Ratio: 17 (ref 11–26)
BUN: 15 mg/dL (ref 8–27)
Bilirubin Total: 0.5 mg/dL (ref 0.0–1.2)
CALCIUM: 9.3 mg/dL (ref 8.7–10.3)
CO2: 23 mmol/L (ref 18–29)
CREATININE: 0.86 mg/dL (ref 0.57–1.00)
Chloride: 100 mmol/L (ref 97–108)
GFR calc Af Amer: 82 mL/min/{1.73_m2} (ref >59)
GFR, EST NON AFRICAN AMERICAN: 71 mL/min/{1.73_m2} (ref >59)
Globulin, Total: 2.9 g/dL (ref 1.5–4.5)
Glucose: 97 mg/dL (ref 65–99)
POTASSIUM: 4.3 mmol/L (ref 3.5–5.2)
Sodium: 139 mmol/L (ref 134–144)
Total Protein: 6.9 g/dL (ref 6.0–8.5)

## 2015-01-18 LAB — CBC WITH DIFFERENTIAL/PLATELET
BASOS ABS: 0.1 10*3/uL (ref 0.0–0.2)
BASOS: 1 %
EOS (ABSOLUTE): 0.2 10*3/uL (ref 0.0–0.4)
EOS: 2 %
Hematocrit: 40.4 % (ref 34.0–46.6)
Hemoglobin: 13 g/dL (ref 11.1–15.9)
IMMATURE GRANS (ABS): 0 10*3/uL (ref 0.0–0.1)
Immature Granulocytes: 0 %
Lymphocytes Absolute: 2.4 10*3/uL (ref 0.7–3.1)
Lymphs: 25 %
MCH: 25.6 pg — ABNORMAL LOW (ref 26.6–33.0)
MCHC: 32.2 g/dL (ref 31.5–35.7)
MCV: 80 fL (ref 79–97)
MONOS ABS: 0.6 10*3/uL (ref 0.1–0.9)
Monocytes: 7 %
NEUTROS PCT: 65 %
Neutrophils Absolute: 6.5 10*3/uL (ref 1.4–7.0)
Platelets: 289 10*3/uL (ref 150–379)
RBC: 5.07 x10E6/uL (ref 3.77–5.28)
RDW: 14.5 % (ref 12.3–15.4)
WBC: 9.8 10*3/uL (ref 3.4–10.8)

## 2015-01-18 LAB — LIPID PANEL
Chol/HDL Ratio: 3.1 ratio units (ref 0.0–4.4)
Cholesterol, Total: 153 mg/dL (ref 100–199)
HDL: 49 mg/dL (ref >39)
LDL Calculated: 85 mg/dL (ref 0–99)
TRIGLYCERIDES: 96 mg/dL (ref 0–149)
VLDL CHOLESTEROL CAL: 19 mg/dL (ref 5–40)

## 2015-01-18 LAB — TSH: TSH: 1.79 u[IU]/mL (ref 0.450–4.500)

## 2015-01-18 LAB — HEMOGLOBIN A1C
ESTIMATED AVERAGE GLUCOSE: 143 mg/dL
HEMOGLOBIN A1C: 6.6 % — AB (ref 4.8–5.6)

## 2015-01-19 ENCOUNTER — Telehealth: Payer: Self-pay

## 2015-01-19 NOTE — Telephone Encounter (Signed)
-----   Message from Margo Common, [place] sent at 01/19/2015  8:34 AM EDT ----- All blood tests in good shape. Diabetes stable and in good control. Continue present medication and recheck in 4 months.

## 2015-01-19 NOTE — Telephone Encounter (Signed)
LMTCB

## 2015-01-22 NOTE — Telephone Encounter (Signed)
Left a message on patient's home voicemail (consent in chart) advising her labs were normal and to follow up in 4 months.

## 2015-01-24 ENCOUNTER — Telehealth: Payer: Self-pay | Admitting: Family Medicine

## 2015-02-01 DIAGNOSIS — H04123 Dry eye syndrome of bilateral lacrimal glands: Secondary | ICD-10-CM | POA: Diagnosis not present

## 2015-02-14 DIAGNOSIS — B0230 Zoster ocular disease, unspecified: Secondary | ICD-10-CM | POA: Diagnosis not present

## 2015-02-14 DIAGNOSIS — Z5181 Encounter for therapeutic drug level monitoring: Secondary | ICD-10-CM | POA: Diagnosis not present

## 2015-02-14 DIAGNOSIS — Z79899 Other long term (current) drug therapy: Secondary | ICD-10-CM | POA: Diagnosis not present

## 2015-02-28 DIAGNOSIS — B0230 Zoster ocular disease, unspecified: Secondary | ICD-10-CM | POA: Diagnosis not present

## 2015-02-28 DIAGNOSIS — M792 Neuralgia and neuritis, unspecified: Secondary | ICD-10-CM | POA: Diagnosis not present

## 2015-03-12 DIAGNOSIS — B0230 Zoster ocular disease, unspecified: Secondary | ICD-10-CM | POA: Insufficient documentation

## 2015-03-12 DIAGNOSIS — M792 Neuralgia and neuritis, unspecified: Secondary | ICD-10-CM | POA: Diagnosis not present

## 2015-03-12 DIAGNOSIS — G500 Trigeminal neuralgia: Secondary | ICD-10-CM | POA: Insufficient documentation

## 2015-04-10 DIAGNOSIS — G501 Atypical facial pain: Secondary | ICD-10-CM | POA: Diagnosis not present

## 2015-04-10 DIAGNOSIS — B0229 Other postherpetic nervous system involvement: Secondary | ICD-10-CM | POA: Diagnosis not present

## 2015-05-04 ENCOUNTER — Telehealth: Payer: Self-pay

## 2015-05-04 NOTE — Telephone Encounter (Signed)
Error-aa 

## 2015-05-07 DIAGNOSIS — M792 Neuralgia and neuritis, unspecified: Secondary | ICD-10-CM | POA: Diagnosis not present

## 2015-05-07 DIAGNOSIS — G894 Chronic pain syndrome: Secondary | ICD-10-CM | POA: Insufficient documentation

## 2015-05-07 DIAGNOSIS — B0229 Other postherpetic nervous system involvement: Secondary | ICD-10-CM | POA: Diagnosis not present

## 2015-05-07 DIAGNOSIS — G501 Atypical facial pain: Secondary | ICD-10-CM | POA: Insufficient documentation

## 2015-05-08 ENCOUNTER — Other Ambulatory Visit: Payer: Self-pay

## 2015-05-08 ENCOUNTER — Encounter: Payer: Self-pay | Admitting: Family Medicine

## 2015-05-08 ENCOUNTER — Ambulatory Visit (INDEPENDENT_AMBULATORY_CARE_PROVIDER_SITE_OTHER): Payer: Medicare Other | Admitting: Family Medicine

## 2015-05-08 VITALS — BP 142/84 | HR 87 | Temp 98.2°F | Resp 16 | Wt 222.0 lb

## 2015-05-08 DIAGNOSIS — E0800 Diabetes mellitus due to underlying condition with hyperosmolarity without nonketotic hyperglycemic-hyperosmolar coma (NKHHC): Secondary | ICD-10-CM | POA: Diagnosis not present

## 2015-05-08 DIAGNOSIS — E7800 Pure hypercholesterolemia, unspecified: Secondary | ICD-10-CM

## 2015-05-08 DIAGNOSIS — I10 Essential (primary) hypertension: Secondary | ICD-10-CM

## 2015-05-08 DIAGNOSIS — R921 Mammographic calcification found on diagnostic imaging of breast: Secondary | ICD-10-CM

## 2015-05-08 DIAGNOSIS — R928 Other abnormal and inconclusive findings on diagnostic imaging of breast: Secondary | ICD-10-CM

## 2015-05-08 MED ORDER — PRAVASTATIN SODIUM 20 MG PO TABS: 20.0000 mg | ORAL_TABLET | Freq: Every day | ORAL | Status: DC

## 2015-05-08 MED ORDER — HYDROCHLOROTHIAZIDE 12.5 MG PO CAPS: 12.5000 mg | ORAL_CAPSULE | Freq: Every day | ORAL | Status: DC

## 2015-05-08 MED ORDER — AMLODIPINE BESYLATE 5 MG PO TABS: 5.0000 mg | ORAL_TABLET | Freq: Every day | ORAL | Status: DC

## 2015-05-08 MED ORDER — METFORMIN HCL 500 MG PO TABS: 500.0000 mg | ORAL_TABLET | Freq: Every day | ORAL | Status: DC

## 2015-05-08 NOTE — Progress Notes (Signed)
Patient ID: [name], female   DOB: Oct 24, 1950, 64 y.o.   MRN: [id number]    Subjective:  Hypertension This is a chronic problem. The current episode started more than 1 year ago. The problem has been gradually improving since onset. The problem is controlled. Pertinent negatives include no blurred vision. There are no associated agents to hypertension. Risk factors for coronary artery disease include diabetes mellitus. Past treatments include diuretics and calcium channel blockers. There are no compliance problems.   Diabetes She presents for her follow-up diabetic visit. She has type 2 diabetes mellitus. Her disease course has been stable. There are no hypoglycemic associated symptoms. Pertinent negatives for diabetes include no blurred vision, no foot paresthesias, no polydipsia, no polyphagia and no polyuria. Symptoms are stable. Pertinent negatives for diabetic complications include no peripheral neuropathy. Risk factors for coronary artery disease include hypertension. Current diabetic treatment includes oral agent (monotherapy).  Hyperlipidemia This is a chronic problem. The current episode started more than 1 year ago. The problem is controlled. Exacerbating diseases include diabetes. There are no known factors aggravating her hyperlipidemia. Current antihyperlipidemic treatment includes statins and diet change.     Prior to Admission medications   Medication Sig Start Date End Date Taking? Authorizing Provider  amLODipine (NORVASC) 5 MG tablet Take 5 mg by mouth daily.    Yes Historical Provider, MD  aspirin EC 81 MG tablet Take 81 mg by mouth daily.    Yes Historical Provider, MD  Cholecalciferol (VITAMIN D) 2000 UNITS tablet Take 2,000 Units by mouth daily.   Yes Historical Provider, MD  Fluticasone Furoate-Vilanterol 200-25 MCG/INH AEPB Inhale 1 puff into the lungs daily.   Yes Historical Provider, MD  gabapentin (NEURONTIN) 600 MG tablet take 5 tablets by mouth once daily  05/01/15  Yes Historical Provider, MD  hydrochlorothiazide (MICROZIDE) 12.5 MG capsule Take 12.5 mg by mouth at bedtime.    Yes Historical Provider, MD  lansoprazole (PREVACID) 30 MG capsule Take 30 mg by mouth daily at 12 noon.   Yes Historical Provider, MD  metFORMIN (GLUCOPHAGE) 500 MG tablet Take 500 mg by mouth daily with breakfast.    Yes Historical Provider, MD  Omega-3 Fatty Acids (FISH OIL EXTRA STRENGTH) 1200 MG CAPS Take 2 capsules by mouth 2 (two) times daily.   Yes Historical Provider, MD  pravastatin (PRAVACHOL) 20 MG tablet Take 20 mg by mouth at bedtime.  11/23/13  Yes Historical Provider, MD  albuterol (PROVENTIL HFA;VENTOLIN HFA) 108 (90 BASE) MCG/ACT inhaler Inhale 2 puffs into the lungs every 6 (six) hours as needed.  08/01/14 10/30/14  Historical Provider, MD  diazepam (VALIUM) 5 MG tablet take 1 to 2 tablets by mouth PRIOR TO PROCEDURE 03/12/15   Historical Provider, MD  DULoxetine (CYMBALTA) 30 MG capsule take 1 capsule by mouth for 1 week then take 2 capsules by mouth once daily 03/12/15   Historical Provider, MD  meloxicam (MOBIC) 7.5 MG tablet Take 7.5 mg by mouth daily. 10/31/14   Historical Provider, MD  montelukast (SINGULAIR) 10 MG tablet Take 10 mg by mouth at bedtime.    Historical Provider, MD    Patient Active Problem List   Diagnosis Date Noted  . Atypical face pain 05/07/2015  . Chronic pain associated with significant psychosocial dysfunction 05/07/2015  . Ganglionitis, gasserian 03/12/2015  . Neuropathic pain 03/12/2015  . Acute stress disorder 01/11/2015  . Anxiety 01/11/2015  . Diabetes ([place]) 01/11/2015  . Photopsia 01/11/2015  . Avitaminosis A 01/11/2015  . Arthritis  01/10/2015  . Clinical depression 01/10/2015  . BP (high blood pressure) 01/10/2015  . Postherpetic neuralgia 11/22/2014  . Adult BMI 30+ 04/18/2014  . HZV (herpes zoster virus) post herpetic neuralgia 12/21/2013  . Cannot sleep 03/13/2009  . Asthma 02/07/2009  . Absolute anemia  10/20/2008  . Calcium deficiency disease 10/16/2008  . Benign essential HTN 07/06/2008  . Hypercholesterolemia without hypertriglyceridemia 07/06/2008  . Herpes 07/06/2008  . Avitaminosis D 07/05/2008  . History of colon polyps 10/17/2005  . Adiposity 06/16/1997    Past Medical History  Diagnosis Date  . Asthma   . Hypertension   . Diabetes mellitus without complication ([place])   . Hyperlipidemia     Social History   Social History  . Marital Status: Widowed    Spouse Name: N/A  . Number of Children: N/A  . Years of Education: N/A   Occupational History  . Not on file.   Social History Main Topics  . Smoking status: Never Smoker   . Smokeless tobacco: Never Used  . Alcohol Use: No  . Drug Use: Not on file  . Sexual Activity: Not on file   Other Topics Concern  . Not on file   Social History Narrative    Allergies  Allergen Reactions  . Lisinopril Cough  . Lovastatin Other (See Comments)    Pain    Review of Systems  Constitutional: Negative.   HENT: Negative.   Eyes: Negative.  Negative for blurred vision.  Respiratory: Negative.   Cardiovascular: Negative.   Gastrointestinal: Negative.   Genitourinary: Negative.   Musculoskeletal: Negative.   Skin: Negative.   Neurological: Negative.   Endo/Heme/Allergies: Negative.  Negative for polydipsia and polyphagia.  Psychiatric/Behavioral: Negative.     Immunization History  Administered Date(s) Administered  . Tdap 07/25/2008   Objective:  BP 142/84 mmHg  Pulse 87  Temp(Src) 98.2 F (36.8 C) (Oral)  Resp 16  Wt 222 lb (100.699 kg)  SpO2 97% BP Readings from Last 3 Encounters:  05/08/15 142/84  01/15/15 125/80  11/09/14 152/98   Wt Readings from Last 3 Encounters:  05/08/15 222 lb (100.699 kg)  01/15/15 229 lb 12.8 oz (104.237 kg)  11/09/14 224 lb 4.8 oz (101.742 kg)     Physical Exam  Constitutional: She is oriented to person, place, and time and well-developed, well-nourished, and in no  distress.  HENT:  Head: Normocephalic.  Nose: Nose normal.  Mouth/Throat: Oropharynx is clear and moist.  Eyes: Conjunctivae are normal.  Neck: Normal range of motion. Neck supple.  Cardiovascular: Normal rate and regular rhythm.   Pulmonary/Chest: Effort normal and breath sounds normal.  Abdominal: Soft. Bowel sounds are normal.  Neurological: She is alert and oriented to person, place, and time.  Skin: No rash noted.  Psychiatric: Affect and judgment normal.    Lab Results  Component Value Date   WBC 9.8 01/17/2015   HGB 12.9 10/11/2013   HCT 40.4 01/17/2015   PLT 249 10/11/2013   GLUCOSE 97 01/17/2015   CHOL 153 01/17/2015   TRIG 96 01/17/2015   HDL 49 01/17/2015   LDLCALC 85 01/17/2015   TSH 1.790 01/17/2015   INR 1.0 01/31/2013   HGBA1C 6.6* 01/17/2015    CMP     Component Value Date/Time   NA 139 01/17/2015 1141   NA 139 11/03/2013 0921   K 4.3 01/17/2015 1141   K 3.8 11/03/2013 0921   CL 100 01/17/2015 1141   CL 101 11/03/2013 0921   CO2 23  01/17/2015 1141   CO2 28 11/03/2013 0921   GLUCOSE 97 01/17/2015 1141   GLUCOSE 99 11/03/2013 0921   BUN 15 01/17/2015 1141   BUN 13 11/03/2013 0921   CREATININE 0.86 01/17/2015 1141   CREATININE 0.94 11/03/2013 0921   CALCIUM 9.3 01/17/2015 1141   CALCIUM 9.0 11/03/2013 0921   PROT 6.9 01/17/2015 1141   PROT 7.6 11/03/2013 0921   ALBUMIN 4.0 01/17/2015 1141   ALBUMIN 3.4 11/03/2013 0921   AST 9 01/17/2015 1141   AST 14* 11/03/2013 0921   ALT 13 01/17/2015 1141   ALT 17 11/03/2013 0921   ALKPHOS 84 01/17/2015 1141   ALKPHOS 87 11/03/2013 0921   BILITOT 0.5 01/17/2015 1141   BILITOT 0.4 11/03/2013 0921   GFRNONAA 71 01/17/2015 1141   GFRNONAA >60 11/03/2013 0921   GFRAA 82 01/17/2015 1141   GFRAA >60 11/03/2013 0921    Assessment and Plan :  1. Benign essential HTN Fairly well controlled. Tolerating medications without side effects. Will refill medications and recheck routine labs. Recheck in 3 months. -  hydrochlorothiazide (MICROZIDE) 12.5 MG capsule; Take 1 capsule (12.5 mg total) by mouth at bedtime.  Dispense: 90 capsule; Refill: 3 - amLODipine (NORVASC) 5 MG tablet; Take 1 tablet (5 mg total) by mouth daily.  Dispense: 90 tablet; Refill: 3  2. Diabetes mellitus due to underlying condition with hyperosmolarity without coma, without long-term current use of insulin (HCC) Good control of diabetes. Tolerating Metformin without side effects. Recheck labs and follow up in three months. - metFORMIN (GLUCOPHAGE) 500 MG tablet; Take 1 tablet (500 mg total) by mouth daily with breakfast.  Dispense: 90 tablet; Refill: 3 - CBC with Differential/Platelet - COMPLETE METABOLIC PANEL WITH GFR - Hemoglobin A1c  3. Hypercholesterolemia without hypertriglyceridemia Trying to follow low fat diet. Continues Pravastatin without side effects. Will refill medications and recheck pending lipid panel report. - pravastatin (PRAVACHOL) 20 MG tablet; Take 1 tablet (20 mg total) by mouth at bedtime.  Dispense: 90 tablet; Refill: 3 - Lipid panel  4. Breast calcifications on mammogram Had recent screening mammograms and radiology department requested order for bilateral diagnostic ultrasounds. Recheck pending reports. - [facility] [place] [facility] 05/08/2015 8:13 AM

## 2015-05-09 LAB — COMPREHENSIVE METABOLIC PANEL
A/G RATIO: 1.4 (ref 1.1–2.5)
ALT: 13 IU/L (ref 0–32)
AST: 12 IU/L (ref 0–40)
Albumin: 4.4 g/dL (ref 3.6–4.8)
Alkaline Phosphatase: 99 IU/L (ref 39–117)
BILIRUBIN TOTAL: 0.5 mg/dL (ref 0.0–1.2)
BUN/Creatinine Ratio: 14 (ref 11–26)
BUN: 13 mg/dL (ref 8–27)
CALCIUM: 9.8 mg/dL (ref 8.7–10.3)
CHLORIDE: 98 mmol/L (ref 97–106)
CO2: 22 mmol/L (ref 18–29)
Creatinine, Ser: 0.94 mg/dL (ref 0.57–1.00)
GFR calc Af Amer: 73 mL/min/{1.73_m2} (ref >59)
GFR, EST NON AFRICAN AMERICAN: 63 mL/min/{1.73_m2} (ref >59)
GLUCOSE: 104 mg/dL — AB (ref 65–99)
Globulin, Total: 3.2 g/dL (ref 1.5–4.5)
POTASSIUM: 4.4 mmol/L (ref 3.5–5.2)
Sodium: 139 mmol/L (ref 136–144)
Total Protein: 7.6 g/dL (ref 6.0–8.5)

## 2015-05-09 LAB — LIPID PANEL
CHOL/HDL RATIO: 3.5 ratio (ref 0.0–4.4)
Cholesterol, Total: 227 mg/dL — ABNORMAL HIGH (ref 100–199)
HDL: 64 mg/dL (ref >39)
LDL Calculated: 146 mg/dL — ABNORMAL HIGH (ref 0–99)
Triglycerides: 86 mg/dL (ref 0–149)
VLDL Cholesterol Cal: 17 mg/dL (ref 5–40)

## 2015-05-09 LAB — CBC WITH DIFFERENTIAL/PLATELET
BASOS: 0 %
Basophils Absolute: 0 10*3/uL (ref 0.0–0.2)
EOS (ABSOLUTE): 0 10*3/uL (ref 0.0–0.4)
EOS: 0 %
HEMATOCRIT: 46.8 % — AB (ref 34.0–46.6)
Hemoglobin: 15.1 g/dL (ref 11.1–15.9)
Immature Grans (Abs): 0 10*3/uL (ref 0.0–0.1)
Immature Granulocytes: 0 %
LYMPHS ABS: 2.4 10*3/uL (ref 0.7–3.1)
Lymphs: 16 %
MCH: 26.2 pg — ABNORMAL LOW (ref 26.6–33.0)
MCHC: 32.3 g/dL (ref 31.5–35.7)
MCV: 81 fL (ref 79–97)
MONOS ABS: 0.8 10*3/uL (ref 0.1–0.9)
Monocytes: 6 %
Neutrophils Absolute: 11.4 10*3/uL — ABNORMAL HIGH (ref 1.4–7.0)
Neutrophils: 78 %
PLATELETS: 314 10*3/uL (ref 150–379)
RBC: 5.76 x10E6/uL — ABNORMAL HIGH (ref 3.77–5.28)
RDW: 15.2 % (ref 12.3–15.4)
WBC: 14.7 10*3/uL — AB (ref 3.4–10.8)

## 2015-05-09 LAB — HEMOGLOBIN A1C
ESTIMATED AVERAGE GLUCOSE: 137 mg/dL
HEMOGLOBIN A1C: 6.4 % — AB (ref 4.8–5.6)

## 2015-05-14 ENCOUNTER — Telehealth: Payer: Self-pay

## 2015-05-14 NOTE — Telephone Encounter (Signed)
-----   Message from Margo Common, [place] sent at 05/14/2015  5:15 AM EST ----- Good control of diabetes but cholesterol elevated. Still need the Metformin and Lipitor daily with diabetic low fat diet. WBC count high. Usually seen with an infection. Any recent respiratory or urine symptoms? Probably will need an antibiotic and recheck of WBC count in 7-10 days.

## 2015-05-14 NOTE — Telephone Encounter (Signed)
Advised patient as below. Patient reports that she has had a mild cough, nothing significant. Denies fever. No urinary symptoms. She would still like an abx since labs did indicate infection. Please send in med into [facility]. Thanks!

## 2015-05-15 MED ORDER — AMOXICILLIN 875 MG PO TABS: 875.0000 mg | ORAL_TABLET | Freq: Two times a day (BID) | ORAL | Status: DC

## 2015-05-15 NOTE — Telephone Encounter (Signed)
Will send to pharmacy and remind patient to schedule follow up check of elevated WBC count.

## 2015-05-16 NOTE — Telephone Encounter (Signed)
Patient advised as directed below. Patient verbalized understanding.  

## 2015-05-17 ENCOUNTER — Telehealth: Payer: Self-pay | Admitting: Family Medicine

## 2015-05-17 NOTE — Telephone Encounter (Signed)
Per [facility] when ordering diagnostic mammogram an order for ultrasound of right/left breast are also needed

## 2015-05-23 ENCOUNTER — Telehealth: Payer: Self-pay | Admitting: Family Medicine

## 2015-05-23 NOTE — Telephone Encounter (Signed)
Please add ultrasound for right/left breast in EPIC for f/u abnormal mammogram.The diagnostic mammogram has already been entered

## 2015-05-24 ENCOUNTER — Other Ambulatory Visit: Payer: Self-pay | Admitting: Family Medicine

## 2015-05-24 DIAGNOSIS — R928 Other abnormal and inconclusive findings on diagnostic imaging of breast: Secondary | ICD-10-CM

## 2015-05-24 NOTE — Progress Notes (Signed)
EPIC will not allow me to make changes to orders that you have put in.[facility] can not see your referral for breast ultrasound.

## 2015-05-28 ENCOUNTER — Other Ambulatory Visit: Payer: Self-pay | Admitting: Family Medicine

## 2015-05-28 DIAGNOSIS — R928 Other abnormal and inconclusive findings on diagnostic imaging of breast: Secondary | ICD-10-CM

## 2015-06-28 ENCOUNTER — Ambulatory Visit (INDEPENDENT_AMBULATORY_CARE_PROVIDER_SITE_OTHER): Payer: Medicare Other | Admitting: Family Medicine

## 2015-06-28 ENCOUNTER — Encounter: Payer: Self-pay | Admitting: Family Medicine

## 2015-06-28 VITALS — BP 138/90 | HR 68 | Temp 97.9°F | Resp 16 | Wt 227.0 lb

## 2015-06-28 DIAGNOSIS — Z862 Personal history of diseases of the blood and blood-forming organs and certain disorders involving the immune mechanism: Secondary | ICD-10-CM | POA: Diagnosis not present

## 2015-06-28 DIAGNOSIS — B0229 Other postherpetic nervous system involvement: Secondary | ICD-10-CM | POA: Diagnosis not present

## 2015-06-28 DIAGNOSIS — G479 Sleep disorder, unspecified: Secondary | ICD-10-CM | POA: Diagnosis not present

## 2015-06-28 MED ORDER — TRAZODONE HCL 50 MG PO TABS: 25.0000 mg | ORAL_TABLET | Freq: Every evening | ORAL | Status: DC | PRN

## 2015-06-28 NOTE — Progress Notes (Signed)
Patient ID: [name], female   DOB: 11-29-1950, 65 y.o.   MRN: [id number]   Patient: [name] Female    DOB: Oct 27, 1950   65 y.o.   MRN: [id number] Visit Date: 06/28/2015  Today's Provider: Vernie Murders, PA   Chief Complaint  Patient presents with  . Abnormal Lab    elevated WBC   Subjective:    HPI  Abnormal labs: Patient is following up to recheck WBC. Elevated WBC on 05/08/2015.  Patient finished antibiotics. Still having post herpetic neuralgia right side of face and forehead (the past 4 years). Continues follow up at the pain management clinic with nerve block injections (not much help). Having some sleep disturbance due to chronic pain. No longer taking Valium or Cymbalta.    Patient Active Problem List   Diagnosis Date Noted  . Atypical face pain 05/07/2015  . Chronic pain associated with significant psychosocial dysfunction 05/07/2015  . Ganglionitis, gasserian 03/12/2015  . Neuropathic pain 03/12/2015  . Acute stress disorder 01/11/2015  . Anxiety 01/11/2015  . Diabetes ([place]) 01/11/2015  . Photopsia 01/11/2015  . Avitaminosis A 01/11/2015  . Arthritis 01/10/2015  . Clinical depression 01/10/2015  . BP (high blood pressure) 01/10/2015  . Postherpetic neuralgia 11/22/2014  . Adult BMI 30+ 04/18/2014  . HZV (herpes zoster virus) post herpetic neuralgia 12/21/2013  . Cannot sleep 03/13/2009  . Asthma 02/07/2009  . Absolute anemia 10/20/2008  . Calcium deficiency disease 10/16/2008  . Benign essential HTN 07/06/2008  . Hypercholesterolemia without hypertriglyceridemia 07/06/2008  . Herpes 07/06/2008  . Avitaminosis D 07/05/2008  . History of colon polyps 10/17/2005  . Adiposity 06/16/1997   Past Surgical History  Procedure Laterality Date  . Total knee arthroplasty Right 2014  . Abdominal hysterectomy    . Total knee arthroplasty Right 02/16/2013   Family History  Problem Relation Age of Onset  . Anuerysm Mother     Brain  . Dementia Mother   .  Heart failure Father   . CAD Father   . Dementia Father   . Diabetes Father   . Lupus Sister   . Diabetes Brother   . Throat cancer Maternal Grandfather    Allergies  Allergen Reactions  . Lisinopril Cough  . Lovastatin Other (See Comments)    Pain   Previous Medications   ALBUTEROL (PROVENTIL HFA;VENTOLIN HFA) 108 (90 BASE) MCG/ACT INHALER    Inhale 2 puffs into the lungs every 6 (six) hours as needed.    AMLODIPINE (NORVASC) 5 MG TABLET    Take 1 tablet (5 mg total) by mouth daily.   ASPIRIN EC 81 MG TABLET    Take 81 mg by mouth daily.    CHOLECALCIFEROL (VITAMIN D) 2000 UNITS TABLET    Take 2,000 Units by mouth daily.   DIAZEPAM (VALIUM) 5 MG TABLET    take 1 to 2 tablets by mouth PRIOR TO PROCEDURE   DULOXETINE (CYMBALTA) 30 MG CAPSULE    take 1 capsule by mouth for 1 week then take 2 capsules by mouth once daily   FLUTICASONE FUROATE-VILANTEROL 200-25 MCG/INH AEPB    Inhale 1 puff into the lungs daily.   GABAPENTIN (NEURONTIN) 600 MG TABLET    take 5 tablets by mouth once daily   HYDROCHLOROTHIAZIDE (MICROZIDE) 12.5 MG CAPSULE    Take 1 capsule (12.5 mg total) by mouth at bedtime.   LANSOPRAZOLE (PREVACID) 30 MG CAPSULE    Take 30 mg by mouth daily at 12 noon.   MELOXICAM (  MOBIC) 7.5 MG TABLET    Take 7.5 mg by mouth daily.   METFORMIN (GLUCOPHAGE) 500 MG TABLET    Take 1 tablet (500 mg total) by mouth daily with breakfast.   MONTELUKAST (SINGULAIR) 10 MG TABLET    Take 10 mg by mouth at bedtime.   OMEGA-3 FATTY ACIDS (FISH OIL EXTRA STRENGTH) 1200 MG CAPS    Take 2 capsules by mouth 2 (two) times daily.   PRAVASTATIN (PRAVACHOL) 20 MG TABLET    Take 1 tablet (20 mg total) by mouth at bedtime.    Review of Systems  Constitutional: Negative.   HENT: Negative.   Eyes: Negative.   Respiratory: Negative.   Cardiovascular: Negative.   Gastrointestinal: Negative.   Endocrine: Negative.   Genitourinary: Negative.   Musculoskeletal: Negative.   Skin: Negative.     Allergic/Immunologic: Negative.   Neurological: Negative.   Hematological: Negative.   Psychiatric/Behavioral: Negative.     Social History  Substance Use Topics  . Smoking status: Never Smoker   . Smokeless tobacco: Never Used  . Alcohol Use: No   Objective:   BP 138/90 mmHg  Pulse 68  Temp(Src) 97.9 F (36.6 C) (Oral)  Resp 16  Wt 227 lb (102.967 kg)  Physical Exam  Constitutional: She is oriented to person, place, and time. She appears well-developed and well-nourished. No distress.  HENT:  Head: Normocephalic and atraumatic.  Right Ear: Hearing normal.  Left Ear: Hearing normal.  Nose: Nose normal.  Very tender left forehead and cheek from post herpetic neuralgia.  Eyes: Conjunctivae, EOM and lids are normal. Right eye exhibits no discharge. Left eye exhibits no discharge. No scleral icterus.  Neck: Normal range of motion. Neck supple.  Cardiovascular: Normal rate and regular rhythm.   Pulmonary/Chest: Effort normal and breath sounds normal. No respiratory distress.  Musculoskeletal: Normal range of motion.  Lymphadenopathy:    She has no cervical adenopathy.  Neurological: She is alert and oriented to person, place, and time.  Skin: Skin is intact. No lesion and no rash noted.  Psychiatric: She has a normal mood and affect. Her speech is normal and behavior is normal. Thought content normal.      Assessment & Plan:      1. History of leukocytosis Feeling well without UTI or URI symptoms. Will recheck WBC count. May be related to persistent neuralgia. Recheck pending - CBC with Differential/Platelet  2. Sleep disturbance Tolerating Trazodone with good help with sleep. Will refill and recheck prn. - traZODone (DESYREL) 50 MG tablet; Take 0.5-1 tablets (25-50 mg total) by mouth at bedtime as needed for sleep.  Dispense: 30 tablet; Refill: 3  3. Postherpetic neuralgia Unchanged and continues Gabapentin with pain management follow up.

## 2015-06-29 LAB — CBC WITH DIFFERENTIAL/PLATELET
BASOS ABS: 0 10*3/uL (ref 0.0–0.2)
Basos: 0 %
EOS (ABSOLUTE): 0.3 10*3/uL (ref 0.0–0.4)
Eos: 2 %
HEMOGLOBIN: 14 g/dL (ref 11.1–15.9)
Hematocrit: 42.7 % (ref 34.0–46.6)
Immature Grans (Abs): 0.1 10*3/uL (ref 0.0–0.1)
Immature Granulocytes: 1 %
LYMPHS ABS: 2.9 10*3/uL (ref 0.7–3.1)
Lymphs: 23 %
MCH: 26.7 pg (ref 26.6–33.0)
MCHC: 32.8 g/dL (ref 31.5–35.7)
MCV: 81 fL (ref 79–97)
MONOCYTES: 7 %
Monocytes Absolute: 0.9 10*3/uL (ref 0.1–0.9)
NEUTROS PCT: 67 %
Neutrophils Absolute: 8.7 10*3/uL — ABNORMAL HIGH (ref 1.4–7.0)
Platelets: 312 10*3/uL (ref 150–379)
RBC: 5.25 x10E6/uL (ref 3.77–5.28)
RDW: 14.1 % (ref 12.3–15.4)
WBC: 12.9 10*3/uL — AB (ref 3.4–10.8)

## 2015-06-30 ENCOUNTER — Telehealth: Payer: Self-pay

## 2015-06-30 NOTE — Telephone Encounter (Signed)
LMTCB  Thanks,  -Joseline 

## 2015-06-30 NOTE — Telephone Encounter (Signed)
-----   Message from [place], [place] sent at 06/29/2015  5:48 PM EST ----- Blood counts greatly improved and coming back in line with normal. Recheck prn.

## 2015-07-02 NOTE — Telephone Encounter (Signed)
LMTCB

## 2015-07-03 NOTE — Telephone Encounter (Signed)
LMTCB  Thanks,  -Joseline 

## 2015-07-03 NOTE — Telephone Encounter (Signed)
Pt is returning call.  CB#[phone] or [phone]/MW

## 2015-07-05 NOTE — Telephone Encounter (Signed)
LMTCB  Thanks.  -Joseline 

## 2015-07-05 NOTE — Telephone Encounter (Signed)
Patient advised of labs results. Patient verbalized understanding.  Thanks,  -Cleona Doubleday

## 2015-07-16 ENCOUNTER — Ambulatory Visit: Admission: RE | Admit: 2015-07-16 | Payer: Medicare Other | Source: Ambulatory Visit

## 2015-07-16 ENCOUNTER — Telehealth: Payer: Self-pay

## 2015-07-16 ENCOUNTER — Ambulatory Visit
Admission: RE | Admit: 2015-07-16 | Discharge: 2015-07-16 | Disposition: A | Discharge status: Home / Self Care | Payer: Medicare Other | Source: Ambulatory Visit | Attending: Family Medicine | Admitting: Family Medicine

## 2015-07-16 DIAGNOSIS — R921 Mammographic calcification found on diagnostic imaging of breast: Secondary | ICD-10-CM | POA: Diagnosis not present

## 2015-07-16 DIAGNOSIS — R928 Other abnormal and inconclusive findings on diagnostic imaging of breast: Secondary | ICD-10-CM

## 2015-07-16 NOTE — Telephone Encounter (Signed)
LMTCB

## 2015-07-16 NOTE — Telephone Encounter (Signed)
-----   Message from [place], [place] sent at 07/16/2015  1:48 PM EST ----- Mammograms show suspicious calcifications in the upper outer quadrants of each breast. Recommend proceeding with radiologist's recommendations.

## 2015-07-17 ENCOUNTER — Other Ambulatory Visit: Payer: Self-pay | Admitting: Family Medicine

## 2015-07-17 DIAGNOSIS — R928 Other abnormal and inconclusive findings on diagnostic imaging of breast: Secondary | ICD-10-CM

## 2015-07-17 DIAGNOSIS — R921 Mammographic calcification found on diagnostic imaging of breast: Secondary | ICD-10-CM

## 2015-07-19 NOTE — Telephone Encounter (Signed)
Patient advised as directed below. Patient verbalized understanding.  

## 2015-07-23 ENCOUNTER — Ambulatory Visit
Admission: RE | Admit: 2015-07-23 | Discharge: 2015-07-23 | Disposition: A | Discharge status: Home / Self Care | Payer: Medicare Other | Source: Ambulatory Visit | Attending: Family Medicine | Admitting: Family Medicine

## 2015-07-23 DIAGNOSIS — R920 Mammographic microcalcification found on diagnostic imaging of breast: Secondary | ICD-10-CM | POA: Insufficient documentation

## 2015-07-23 DIAGNOSIS — N6021 Fibroadenosis of right breast: Secondary | ICD-10-CM | POA: Diagnosis not present

## 2015-07-23 DIAGNOSIS — R921 Mammographic calcification found on diagnostic imaging of breast: Secondary | ICD-10-CM | POA: Diagnosis not present

## 2015-07-24 LAB — SURGICAL PATHOLOGY

## 2015-08-07 DIAGNOSIS — H04123 Dry eye syndrome of bilateral lacrimal glands: Secondary | ICD-10-CM | POA: Diagnosis not present

## 2015-08-13 ENCOUNTER — Other Ambulatory Visit: Payer: Self-pay | Admitting: Family Medicine

## 2015-08-13 DIAGNOSIS — J45909 Unspecified asthma, uncomplicated: Secondary | ICD-10-CM

## 2015-09-07 DIAGNOSIS — H04123 Dry eye syndrome of bilateral lacrimal glands: Secondary | ICD-10-CM | POA: Diagnosis not present

## 2015-10-25 ENCOUNTER — Encounter: Payer: Self-pay | Admitting: Family Medicine

## 2015-10-25 ENCOUNTER — Ambulatory Visit (INDEPENDENT_AMBULATORY_CARE_PROVIDER_SITE_OTHER): Payer: Medicare Other | Admitting: Family Medicine

## 2015-10-25 VITALS — BP 142/90 | HR 72 | Temp 97.8°F | Resp 16 | Wt 225.8 lb

## 2015-10-25 DIAGNOSIS — I10 Essential (primary) hypertension: Secondary | ICD-10-CM

## 2015-10-25 DIAGNOSIS — E0800 Diabetes mellitus due to underlying condition with hyperosmolarity without nonketotic hyperglycemic-hyperosmolar coma (NKHHC): Secondary | ICD-10-CM

## 2015-10-25 DIAGNOSIS — B0229 Other postherpetic nervous system involvement: Secondary | ICD-10-CM | POA: Diagnosis not present

## 2015-10-25 DIAGNOSIS — E7800 Pure hypercholesterolemia, unspecified: Secondary | ICD-10-CM | POA: Diagnosis not present

## 2015-10-25 MED ORDER — OLOPATADINE HCL 0.7 % OP SOLN: 1.0000 [drp] | Freq: Every day | OPHTHALMIC | Status: DC

## 2015-10-25 NOTE — Progress Notes (Signed)
Patient ID: [name], female   DOB: 06-20-1950, 65 y.o.   MRN: [id number]   Patient: [name] Female    DOB: 05-Oct-1950   65 y.o.   MRN: [id number] Visit Date: 10/25/2015  Today's Provider: Vernie Murders, PA   Chief Complaint  Patient presents with  . Hyperlipidemia  . Diabetes  . Hypertension  . Follow-up   Subjective:    HPI  Diabetes Mellitus Type II, Follow-up:   Lab Results  Component Value Date   HGBA1C 6.4* 05/08/2015   HGBA1C 6.6* 01/17/2015   Last seen for diabetes 6 months ago.  Management since then includes continue Metformin 500 mg  . She reports good compliance with treatment. She is not having side effects.  Current symptoms include none and have been stable. Home blood sugar records: not being checked   Weight trend: stable Current diet: no diet Current exercise: none  ------------------------------------------------------------------------   Hypertension, follow-up:  BP Readings from Last 3 Encounters:  10/25/15 142/90  06/28/15 138/90  05/08/15 142/84    She was last seen for hypertension 6 months ago.  BP at that visit was 142/84. Management since that visit includes continue HCTZ and Amlodipine  .She reports good compliance with treatment. She is not having side effects.  She is not exercising. She is not adherent to low salt diet.   Outside blood pressures are not being checked. She is experiencing none.  Patient denies none.   Cardiovascular risk factors include advanced age (older than 24 for men, 17 for women), diabetes mellitus, dyslipidemia and obesity (BMI >= 30 kg/m2).  Use of agents associated with hypertension: none.   ------------------------------------------------------------------------    Lipid/Cholesterol, Follow-up:   Last seen for this 6 months ago.  Management since that visit includes continue Pravastatin.  Last Lipid Panel:    Component Value Date/Time   CHOL 227* 05/08/2015 0930   CHOL 210*  11/03/2013 0921   TRIG 86 05/08/2015 0930   TRIG 97 11/03/2013 0921   HDL 64 05/08/2015 0930   HDL 50 11/03/2013 0921   CHOLHDL 3.5 05/08/2015 0930   VLDL 19 11/03/2013 0921   LDLCALC 146* 05/08/2015 0930   LDLCALC 141* 11/03/2013 0921    She reports good compliance with treatment. She is not having side effects.   Wt Readings from Last 3 Encounters:  10/25/15 225 lb 12.8 oz (102.422 kg)  06/28/15 227 lb (102.967 kg)  05/08/15 222 lb (100.699 kg)    ------------------------------------------------------------------------   Past Medical History  Diagnosis Date  . Asthma   . Hypertension   . Diabetes mellitus without complication ([place])   . Hyperlipidemia    Past Surgical History  Procedure Laterality Date  . Total knee arthroplasty Right 2014  . Abdominal hysterectomy    . Total knee arthroplasty Right 02/16/2013   Family History  Problem Relation Age of Onset  . Anuerysm Mother     Brain  . Dementia Mother   . Heart failure Father   . CAD Father   . Dementia Father   . Diabetes Father   . Lupus Sister   . Diabetes Brother   . Throat cancer Maternal Grandfather    Previous Medications   ALBUTEROL (PROVENTIL HFA;VENTOLIN HFA) 108 (90 BASE) MCG/ACT INHALER    Inhale 2 puffs into the lungs every 6 (six) hours as needed.    AMLODIPINE (NORVASC) 5 MG TABLET    Take 1 tablet (5 mg total) by mouth daily.   ASPIRIN EC 81 MG  TABLET    Take 81 mg by mouth daily.    BREO ELLIPTA 200-25 MCG/INH AEPB    inhale 1 dose once daily   CHOLECALCIFEROL (VITAMIN D) 2000 UNITS TABLET    Take 2,000 Units by mouth daily.   GABAPENTIN (NEURONTIN) 600 MG TABLET    take 5 tablets by mouth once daily   HYDROCHLOROTHIAZIDE (MICROZIDE) 12.5 MG CAPSULE    Take 1 capsule (12.5 mg total) by mouth at bedtime.   LANSOPRAZOLE (PREVACID) 30 MG CAPSULE    Take 30 mg by mouth daily at 12 noon.   MELOXICAM (MOBIC) 7.5 MG TABLET    Take 7.5 mg by mouth daily.   METFORMIN (GLUCOPHAGE) 500 MG TABLET     Take 1 tablet (500 mg total) by mouth daily with breakfast.   MONTELUKAST (SINGULAIR) 10 MG TABLET    Take 10 mg by mouth at bedtime.   OMEGA-3 FATTY ACIDS (FISH OIL EXTRA STRENGTH) 1200 MG CAPS    Take 2 capsules by mouth 2 (two) times daily.   PRAVASTATIN (PRAVACHOL) 20 MG TABLET    Take 1 tablet (20 mg total) by mouth at bedtime.   TRAZODONE (DESYREL) 50 MG TABLET    Take 0.5-1 tablets (25-50 mg total) by mouth at bedtime as needed for sleep.   Allergies  Allergen Reactions  . Lisinopril Cough  . Lovastatin Other (See Comments)    Pain    Review of Systems  Constitutional: Negative.   HENT: Negative.   Eyes: Negative.   Respiratory: Negative.   Cardiovascular: Negative.   Gastrointestinal: Negative.   Endocrine: Negative.   Genitourinary: Negative.   Musculoskeletal: Negative.   Skin: Negative.   Allergic/Immunologic: Negative.   Neurological: Negative.   Hematological: Negative.   Psychiatric/Behavioral: Negative.     Social History  Substance Use Topics  . Smoking status: Never Smoker   . Smokeless tobacco: Never Used  . Alcohol Use: No   Objective:   BP 142/90 mmHg  Pulse 72  Temp(Src) 97.8 F (36.6 C) (Oral)  Resp 16  Wt 225 lb 12.8 oz (102.422 kg)  Physical Exam  Constitutional: She is oriented to person, place, and time. She appears well-developed and well-nourished. No distress.  HENT:  Head: Normocephalic and atraumatic.  Right Ear: Hearing and external ear normal.  Left Ear: Hearing and external ear normal.  Nose: Nose normal.  Mouth/Throat: Oropharynx is clear and moist.  Eyes: Conjunctivae, EOM and lids are normal. Right eye exhibits no discharge. Left eye exhibits no discharge. No scleral icterus.  Neck: Normal range of motion. Neck supple.  Cardiovascular: Normal rate and regular rhythm.   Pulmonary/Chest: Effort normal. No respiratory distress.  Abdominal: Soft. Bowel sounds are normal.  Musculoskeletal: Normal range of motion.  Neurological:  She is alert and oriented to person, place, and time.  Burning pain right eyebrow and scalp.  Skin: Skin is intact. No lesion and no rash noted.  Psychiatric: She has a normal mood and affect. Her speech is normal and behavior is normal. Thought content normal.     Assessment & Plan:      1. Diabetes mellitus due to underlying condition with hyperosmolarity without coma, without long-term current use of insulin (HCC) Tolerating Metformin without side effects. Had last eye exam a month ago. No retinopathy reported. Will recheck labs and continue present dosage of Metformin. Denies neuropathy in feet or hands. - Comprehensive metabolic panel - Hemoglobin A1c  2. Benign essential HTN Stable on the Amlodipine and Hydrochlorothiazide.  No chest discomfort, palpitations, dyspnea or muscle cramps. Will recheck labs and continue present regimen. - CBC with Differential/Platelet  3. Hypercholesterolemia without hypertriglyceridemia No muscle pains with use of the Pravastatin and Fish Oil. Continue low fat diet. Has lost 2 lbs since last OV. Recheck lipids and CMP. Follow up pending reports. - Lipid panel  4. Postherpetic neuralgia Continues to have burning pain in the right side of her face, forehead and scalp over the past 4 years following herpes zoster. Requests second opinion and follow up at a local pain management clinic. Not much help from Gabapentin. Having some itching in the right eye. Ophthalmologist gave a sample of Olopatadine Drops and it seemed to help. Requests refill. Will schedule referral to pain clinic. - Ambulatory referral to Pain Clinic - Olopatadine HCl 0.7 % SOLN; Apply 1 drop to eye daily.  Dispense: 2.5 mL; Refill: 0

## 2015-10-26 LAB — CBC WITH DIFFERENTIAL/PLATELET
Basophils Absolute: 0.1 10*3/uL (ref 0.0–0.2)
Basos: 1 %
EOS (ABSOLUTE): 0.2 10*3/uL (ref 0.0–0.4)
EOS: 2 %
HEMATOCRIT: 42.1 % (ref 34.0–46.6)
HEMOGLOBIN: 13.4 g/dL (ref 11.1–15.9)
IMMATURE GRANS (ABS): 0 10*3/uL (ref 0.0–0.1)
IMMATURE GRANULOCYTES: 0 %
LYMPHS ABS: 2.6 10*3/uL (ref 0.7–3.1)
LYMPHS: 28 %
MCH: 26 pg — ABNORMAL LOW (ref 26.6–33.0)
MCHC: 31.8 g/dL (ref 31.5–35.7)
MCV: 82 fL (ref 79–97)
MONOCYTES: 9 %
Monocytes Absolute: 0.8 10*3/uL (ref 0.1–0.9)
Neutrophils Absolute: 5.6 10*3/uL (ref 1.4–7.0)
Neutrophils: 60 %
Platelets: 288 10*3/uL (ref 150–379)
RBC: 5.16 x10E6/uL (ref 3.77–5.28)
RDW: 14.3 % (ref 12.3–15.4)
WBC: 9.2 10*3/uL (ref 3.4–10.8)

## 2015-10-26 LAB — COMPREHENSIVE METABOLIC PANEL
ALBUMIN: 3.9 g/dL (ref 3.6–4.8)
ALT: 10 IU/L (ref 0–32)
AST: 11 IU/L (ref 0–40)
Albumin/Globulin Ratio: 1.3 (ref 1.2–2.2)
Alkaline Phosphatase: 78 IU/L (ref 39–117)
BUN / CREAT RATIO: 16 (ref 12–28)
BUN: 14 mg/dL (ref 8–27)
Bilirubin Total: 0.4 mg/dL (ref 0.0–1.2)
CALCIUM: 9.2 mg/dL (ref 8.7–10.3)
CO2: 25 mmol/L (ref 18–29)
CREATININE: 0.87 mg/dL (ref 0.57–1.00)
Chloride: 102 mmol/L (ref 96–106)
GFR, EST AFRICAN AMERICAN: 80 mL/min/{1.73_m2} (ref >59)
GFR, EST NON AFRICAN AMERICAN: 70 mL/min/{1.73_m2} (ref >59)
GLOBULIN, TOTAL: 3 g/dL (ref 1.5–4.5)
Glucose: 94 mg/dL (ref 65–99)
Potassium: 4.2 mmol/L (ref 3.5–5.2)
SODIUM: 142 mmol/L (ref 134–144)
TOTAL PROTEIN: 6.9 g/dL (ref 6.0–8.5)

## 2015-10-26 LAB — LIPID PANEL
CHOL/HDL RATIO: 3.4 ratio (ref 0.0–4.4)
Cholesterol, Total: 160 mg/dL (ref 100–199)
HDL: 47 mg/dL (ref >39)
LDL Calculated: 88 mg/dL (ref 0–99)
TRIGLYCERIDES: 125 mg/dL (ref 0–149)
VLDL CHOLESTEROL CAL: 25 mg/dL (ref 5–40)

## 2015-10-26 LAB — HEMOGLOBIN A1C
Est. average glucose Bld gHb Est-mCnc: 140 mg/dL
Hgb A1c MFr Bld: 6.5 % — ABNORMAL HIGH (ref 4.8–5.6)

## 2015-10-29 ENCOUNTER — Telehealth: Payer: Self-pay

## 2015-10-29 NOTE — Telephone Encounter (Signed)
-----   Message from Margo [place], [place] sent at 10/29/2015  9:12 AM EDT ----- All blood tests in good shape. Continue present medication regimen and recheck diabetes in 3 months.

## 2015-10-29 NOTE — Telephone Encounter (Signed)
Left message to call back  

## 2015-11-01 NOTE — Telephone Encounter (Signed)
Advised  ED 

## 2015-11-01 NOTE — Telephone Encounter (Signed)
Pt is returning call. CB#[phone]/MW

## 2015-11-13 ENCOUNTER — Other Ambulatory Visit: Payer: Self-pay | Admitting: Family Medicine

## 2015-11-13 DIAGNOSIS — M792 Neuralgia and neuritis, unspecified: Secondary | ICD-10-CM

## 2015-11-13 MED ORDER — GABAPENTIN 600 MG PO TABS: ORAL_TABLET | ORAL | Status: DC

## 2015-11-13 NOTE — Telephone Encounter (Signed)
Pt contacted office for refill request on the following medications: gabapentin (NEURONTIN) 600 MG tablet to [facility]. Please advise. Thanks TNP

## 2015-11-13 NOTE — Telephone Encounter (Signed)
Patient normally seen by [name]. Last OV was 10/25/2015. Med was last filled on 05/01/2015 #[id number].

## 2015-11-27 ENCOUNTER — Other Ambulatory Visit: Payer: Self-pay | Admitting: Family Medicine

## 2015-11-27 NOTE — Telephone Encounter (Signed)
PT needs gabapentin (NEURONTIN) 600 MG tablet needs RX sent to the [place].  She states she has requested several times.  She also wants to follow up on the pain specialist referral.

## 2015-11-27 NOTE — Telephone Encounter (Signed)
Advised patient the referral looks like it was scheduled for today with Dr. Consuela Mimes. States she did not get word about the appointment time. Will ask Judson Roch to reschedule or get patient the phone number for her to reschedule and take [place] out of the loop. Also, looks like Dr. Venia Minks sent the last bit of prescription clarification to the mail order pharmacy for the [facility] on 11-23-15 and it should arrive at her home in the next couple days.

## 2015-11-28 NOTE — Telephone Encounter (Signed)
LMTCB

## 2015-11-28 NOTE — Telephone Encounter (Signed)
Information to contact Dr Adalberto Cole office left on home answering machine.Their office prefers to schedule appointments with pt

## 2015-11-28 NOTE — Telephone Encounter (Signed)
Patient returned phone call and was accidentally transferred to my extension. Tried Sarah's extension but she was out to lunch. Advised patient I would send message that she returned call. She states she can be reached at her home number until 2:30 this afternoon. After that she can be reached at [phone]. Thanks.

## 2016-01-07 ENCOUNTER — Ambulatory Visit: Payer: Medicare Other | Admitting: Pain Medicine

## 2016-01-11 DIAGNOSIS — E119 Type 2 diabetes mellitus without complications: Secondary | ICD-10-CM | POA: Diagnosis not present

## 2016-01-21 ENCOUNTER — Encounter: Payer: Self-pay | Admitting: Pain Medicine

## 2016-01-21 ENCOUNTER — Ambulatory Visit
Admission: RE | Admit: 2016-01-21 | Discharge: 2016-01-21 | Disposition: A | Discharge status: Home / Self Care | Payer: Medicare Other | Source: Ambulatory Visit | Attending: Pain Medicine | Admitting: Pain Medicine

## 2016-01-21 ENCOUNTER — Other Ambulatory Visit
Admission: RE | Admit: 2016-01-21 | Discharge: 2016-01-21 | Disposition: A | Discharge status: Home / Self Care | Payer: Medicare Other | Source: Ambulatory Visit | Attending: Pain Medicine | Admitting: Pain Medicine

## 2016-01-21 ENCOUNTER — Encounter (INDEPENDENT_AMBULATORY_CARE_PROVIDER_SITE_OTHER): Payer: Self-pay

## 2016-01-21 ENCOUNTER — Ambulatory Visit: Payer: Medicare Other | Attending: Pain Medicine | Admitting: Pain Medicine

## 2016-01-21 VITALS — BP 148/96 | HR 88 | Temp 98.5°F | Resp 18 | Ht 63.0 in | Wt 220.0 lb

## 2016-01-21 DIAGNOSIS — J45909 Unspecified asthma, uncomplicated: Secondary | ICD-10-CM | POA: Diagnosis not present

## 2016-01-21 DIAGNOSIS — E559 Vitamin D deficiency, unspecified: Secondary | ICD-10-CM

## 2016-01-21 DIAGNOSIS — F329 Major depressive disorder, single episode, unspecified: Secondary | ICD-10-CM | POA: Insufficient documentation

## 2016-01-21 DIAGNOSIS — R209 Unspecified disturbances of skin sensation: Secondary | ICD-10-CM

## 2016-01-21 DIAGNOSIS — M5030 Other cervical disc degeneration, unspecified cervical region: Secondary | ICD-10-CM | POA: Insufficient documentation

## 2016-01-21 DIAGNOSIS — M792 Neuralgia and neuritis, unspecified: Secondary | ICD-10-CM

## 2016-01-21 DIAGNOSIS — B0222 Postherpetic trigeminal neuralgia: Secondary | ICD-10-CM | POA: Diagnosis not present

## 2016-01-21 DIAGNOSIS — H5319 Other subjective visual disturbances: Secondary | ICD-10-CM | POA: Insufficient documentation

## 2016-01-21 DIAGNOSIS — M47812 Spondylosis without myelopathy or radiculopathy, cervical region: Secondary | ICD-10-CM | POA: Diagnosis not present

## 2016-01-21 DIAGNOSIS — B0221 Postherpetic geniculate ganglionitis: Secondary | ICD-10-CM | POA: Insufficient documentation

## 2016-01-21 DIAGNOSIS — E509 Vitamin A deficiency, unspecified: Secondary | ICD-10-CM | POA: Insufficient documentation

## 2016-01-21 DIAGNOSIS — R51 Headache: Secondary | ICD-10-CM | POA: Diagnosis not present

## 2016-01-21 DIAGNOSIS — M5481 Occipital neuralgia: Secondary | ICD-10-CM | POA: Insufficient documentation

## 2016-01-21 DIAGNOSIS — Z8619 Personal history of other infectious and parasitic diseases: Secondary | ICD-10-CM | POA: Diagnosis not present

## 2016-01-21 DIAGNOSIS — Z79899 Other long term (current) drug therapy: Secondary | ICD-10-CM | POA: Diagnosis not present

## 2016-01-21 DIAGNOSIS — F419 Anxiety disorder, unspecified: Secondary | ICD-10-CM | POA: Diagnosis not present

## 2016-01-21 DIAGNOSIS — R200 Anesthesia of skin: Secondary | ICD-10-CM | POA: Insufficient documentation

## 2016-01-21 DIAGNOSIS — E669 Obesity, unspecified: Secondary | ICD-10-CM | POA: Diagnosis not present

## 2016-01-21 DIAGNOSIS — I10 Essential (primary) hypertension: Secondary | ICD-10-CM | POA: Insufficient documentation

## 2016-01-21 DIAGNOSIS — K219 Gastro-esophageal reflux disease without esophagitis: Secondary | ICD-10-CM | POA: Insufficient documentation

## 2016-01-21 DIAGNOSIS — B0229 Other postherpetic nervous system involvement: Secondary | ICD-10-CM | POA: Diagnosis not present

## 2016-01-21 DIAGNOSIS — E58 Dietary calcium deficiency: Secondary | ICD-10-CM | POA: Diagnosis not present

## 2016-01-21 DIAGNOSIS — Z7982 Long term (current) use of aspirin: Secondary | ICD-10-CM | POA: Insufficient documentation

## 2016-01-21 DIAGNOSIS — R519 Headache, unspecified: Secondary | ICD-10-CM

## 2016-01-21 DIAGNOSIS — M069 Rheumatoid arthritis, unspecified: Secondary | ICD-10-CM | POA: Insufficient documentation

## 2016-01-21 DIAGNOSIS — E119 Type 2 diabetes mellitus without complications: Secondary | ICD-10-CM | POA: Insufficient documentation

## 2016-01-21 DIAGNOSIS — Z7984 Long term (current) use of oral hypoglycemic drugs: Secondary | ICD-10-CM | POA: Insufficient documentation

## 2016-01-21 DIAGNOSIS — G8929 Other chronic pain: Secondary | ICD-10-CM | POA: Diagnosis not present

## 2016-01-21 DIAGNOSIS — Z6839 Body mass index (BMI) 39.0-39.9, adult: Secondary | ICD-10-CM | POA: Diagnosis not present

## 2016-01-21 DIAGNOSIS — Z86010 Personal history of colonic polyps: Secondary | ICD-10-CM | POA: Insufficient documentation

## 2016-01-21 DIAGNOSIS — E7800 Pure hypercholesterolemia, unspecified: Secondary | ICD-10-CM | POA: Diagnosis not present

## 2016-01-21 LAB — COMPREHENSIVE METABOLIC PANEL
ALK PHOS: 82 U/L (ref 38–126)
ALT: 15 U/L (ref 14–54)
ANION GAP: 8 (ref 5–15)
AST: 17 U/L (ref 15–41)
Albumin: 3.8 g/dL (ref 3.5–5.0)
BUN: 9 mg/dL (ref 6–20)
CALCIUM: 9 mg/dL (ref 8.9–10.3)
CO2: 28 mmol/L (ref 22–32)
Chloride: 104 mmol/L (ref 101–111)
Creatinine, Ser: 0.74 mg/dL (ref 0.44–1.00)
GFR calc non Af Amer: >60 mL/min (ref >60)
Glucose, Bld: 101 mg/dL — ABNORMAL HIGH (ref 65–99)
Potassium: 4 mmol/L (ref 3.5–5.1)
SODIUM: 140 mmol/L (ref 135–145)
TOTAL PROTEIN: 7.6 g/dL (ref 6.5–8.1)
Total Bilirubin: 0.7 mg/dL (ref 0.3–1.2)

## 2016-01-21 LAB — SEDIMENTATION RATE: SED RATE: 7 mm/h (ref 0–30)

## 2016-01-21 LAB — VITAMIN B12: Vitamin B-12: 436 pg/mL (ref 180–914)

## 2016-01-21 LAB — C-REACTIVE PROTEIN: CRP: 1.1 mg/dL — ABNORMAL HIGH (ref <1.0)

## 2016-01-21 LAB — MAGNESIUM: MAGNESIUM: 2 mg/dL (ref 1.7–2.4)

## 2016-01-21 NOTE — Progress Notes (Signed)
Normal fasting (NPO x 8 hours) glucose levels are between 65-99 mg/dl, with 2 hour fasting, levels are usually less than 140 mg/dl. Any random blood glucose level greater than 200 mg/dl is considered to be Diabetes.

## 2016-01-21 NOTE — Progress Notes (Signed)
Normal levels of C-Reactive Protein for our Lab are less than 1.0 mg/L. C-reactive protein (CRP) is produced by the liver. The level of CRP rises when there is inflammation throughout the body. CRP goes up in response to inflammation. High levels suggests the presence of chronic inflammation but do not identify its location or cause. High levels have been observed in obese patients, individuals with bacterial infections, chronic inflammation, or flare-ups of inflammatory conditions. Drops of previously elevated levels suggest that the inflammation or infection is subsiding and/or responding to treatment.

## 2016-01-21 NOTE — Progress Notes (Signed)
Patient's Name: [name]  Patient type: New patient  MRN: [id number]  Service setting: Ambulatory outpatient  DOB: 29-May-1951  Location: [facility]  DOS: 01/21/2016  Primary Care Physician: Sarah Rana, MD  Note by: Kathlen Brunswick. Dossie Arbour, M.D, DABA, DABAPM, DABPM, Milagros Evener, [place]  Referring Physician: Margarita Rana, MD  Specialty: Board-Certified Interventional Pain Management     Primary Reason(s) for Visit: Initial Patient Evaluation CC: Facial Pain (right side) and Headache (right side)   HPI  [name] is a 65 y.o. year old, female patient, who comes today for an initial evaluation. She has Postherpetic neuralgia; Depression; BP (high blood pressure); Obesity (BMI 35.0-39.9 without comorbidity) ([place]); Neuropathic postherpetic trigeminal neuralgia (Right) (V1); Anxiety; Asthma; Diabetes ([place]); Photopsia; Benign essential HTN; History of colon polyps; Calcium deficiency disease; Cannot sleep; Hypercholesterolemia without hypertriglyceridemia; Herpes; Avitaminosis A; Avitaminosis D; Gasserian ganglionitis; Neuropathic pain; Atypical facial pain (Right); Chronic pain syndrome; Chronic pain; Encounter for long-term (current) use of medications; Disturbance of skin sensation; Occipital headache; and Bilateral occipital neuralgia on her problem list.. Her primarily concern today is the Facial Pain (right side) and Headache (right side)   Pain Assessment: Self-Reported Pain Score: 6  Clinically the patient looks like a 3/10 Reported level is inconsistent with clinical obrservations Information on the proper use of the pain score provided to the patient today. Pain Type: Chronic pain Pain Location: Face (head) Pain Orientation: Right Pain Descriptors / Indicators: Aching, Tender Pain Frequency: Constant  Onset and Duration: Sudden, Date of onset: Approximately 6 years ago and Present longer than 3 months Cause of pain: Status post shingles over the ophthalmic  distribution on the right side of the face. Severity: No change since onset and NAS-11 at its worse: 10/10 Timing: Not influenced by the time of the day Aggravating Factors: Bending Alleviating Factors: None except for the gabapentin. Associated Problems: Pain that wakes patient up and Pain that does not allow patient to sleep Quality of Pain: Burning, Itching, Nagging, Sharp, Shooting, Tender and Throbbing Previous Examinations or Tests: CT scan and Nerve block Previous Treatments: Morphine pump  The patient comes into the clinics today for the first time for a chronic pain management evaluation. The patient describes the primary pain to be over the right upper quadrant of her face in the distribution of the ophthalmic and perhaps some of the maxillary branch of the trigeminal nerve on the right side. The patient also indicates that her secondary pain is that of occipital headaches which are bilateral. The patient indicates having had several nerve blocks done at a [place] pain clinic. Unfortunately she was unable to tell me the name of the physician or the practice. She indicated that she will try to go home and search for those and call us back and let us know so that we can contact them requesting for more information on the nerve blocks that she had done.   Historic Controlled Substance Pharmacotherapy Review  Previously Prescribed Opioids: Hydrocodone/APAP 7.5/325 one tablet by mouth 4 times a day when necessary for pain; diazepam 5 mg twice a day; hydrocodone/chlorphentermine ER suspension (240 mL's, 10 mL/day) Currently Prescribed Analgesic: Hydrocodone/APAP 7.5/325 one Q6 hours when necessary for pain. Medications: The patient did not bring the medication(s) to the appointment, as requested in our "New Patient Package" MME/day: 30 mg/day Pharmacodynamics: Analgesic Effect: More than 50% Activity Facilitation: Medication(s) allow patient to sit, stand, walk, and do the basic  ADLs Perceived Effectiveness: Described as relatively effective, allowing for  increase in activities of daily living (ADL) Side-effects or Adverse reactions: None reported Historical Background Evaluation: [place] PDMP: Five (5) year initial data search conducted. No abnormal patterns identified [place] [employer] Offender Public Information: Non-contributory UDS Results: No UDS results available at this time UDS Interpretation: N/A Medication Assessment Form: Not applicable. Initial evaluation. The patient has not received any medications from our practice Treatment compliance: Not applicable. Initial evaluation Risk Assessment: Aberrant Behavior: None observed or detected today Opioid Fatal Overdose Risk Factors: None identified today Non-fatal overdose hazard ratio (HR): Calculation deferred Fatal overdose hazard ratio (HR): Calculation deferred Substance Use Disorder (SUD) Risk Level: Pending results of Medical Psychology Evaluation for SUD Opioid Risk Tool (ORT) Score: Total Score: 4 Moderate Risk for SUD (Score between 4-7) Depression Scale Score: PHQ-2: PHQ-2 Total Score: 0 No depression (0) PHQ-9: PHQ-9 Total Score: 0 No depression (0-4)  Pharmacologic Plan: Pending ordered tests and/or consults  Historical Illicit Drug Screen Labs(s): No results found for: MDMA, COCAINSCRNUR, PCPSCRNUR, THCU, ETH  Meds  The patient has a current medication list which includes the following prescription(s): amlodipine, aspirin ec, breo ellipta, gabapentin, hydrochlorothiazide, lansoprazole, metformin, olopatadine hcl, pravastatin, trazodone, and albuterol.  Current Outpatient Prescriptions on File Prior to Visit  Medication Sig  . amLODipine (NORVASC) 5 MG tablet Take 1 tablet (5 mg total) by mouth daily.  Marland Kitchen aspirin EC 81 MG tablet Take 81 mg by mouth daily.   Marland Kitchen BREO ELLIPTA 200-25 MCG/INH AEPB inhale 1 dose once daily  . gabapentin (NEURONTIN) 600 MG tablet Take 6 tablets by mouth  daily  . hydrochlorothiazide (MICROZIDE) 12.5 MG capsule Take 1 capsule (12.5 mg total) by mouth at bedtime.  . lansoprazole (PREVACID) 30 MG capsule Take 30 mg by mouth daily at 12 noon.  . metFORMIN (GLUCOPHAGE) 500 MG tablet Take 1 tablet (500 mg total) by mouth daily with breakfast.  . Olopatadine HCl 0.7 % SOLN Apply 1 drop to eye daily.  . pravastatin (PRAVACHOL) 20 MG tablet Take 1 tablet (20 mg total) by mouth at bedtime.  . traZODone (DESYREL) 50 MG tablet Take 0.5-1 tablets (25-50 mg total) by mouth at bedtime as needed for sleep.  Marland Kitchen albuterol (PROVENTIL HFA;VENTOLIN HFA) 108 (90 BASE) MCG/ACT inhaler Inhale 2 puffs into the lungs every 6 (six) hours as needed.    No current facility-administered medications on file prior to visit.     Imaging Review  Shoulder Imaging: Shoulder-R DG:  Results for orders placed in visit on 01/01/08  DG Shoulder Right   Narrative * PRIOR REPORT IMPORTED FROM AN EXTERNAL SYSTEM *   PRIOR REPORT IMPORTED FROM THE SYNGO WORKFLOW SYSTEM   REASON FOR EXAM:    fall pain  COMMENTS:   PROCEDURE:     DXR - DXR SHOULDER RIGHT COMPLETE  - Jan 01 2008  1:57PM   RESULT:     Three views of the RIGHT shoulder reveal the bones to be  osteopenic. I do not see evidence of an acute fracture.   IMPRESSION:   I see no acute bony abnormality of the shoulder. If there are strong  clinical concerns of an occult humeral head fracture, further evaluation  with CT scanning would be useful. I do not see objective evidence of such  a  fracture on these three plain films.   Thank you for the opportunity to contribute to the care of your patient.       Knee Imaging: Knee-R MR wo contrast:  Results for  orders placed in visit on 03/09/08  MR Knee Right Wo Contrast   Narrative * PRIOR REPORT IMPORTED FROM AN EXTERNAL SYSTEM *   PRIOR REPORT IMPORTED FROM THE SYNGO WORKFLOW SYSTEM   REASON FOR EXAM:    right knee pain  COMMENTS:   PROCEDURE:     MR  - MR  KNEE RT  WO  CONTRAST  - Mar 09 2008  3:10PM   RESULT:     Multiplanar/multisequence imaging of the knee was obtained  without the administration of gadolinium.   There is normal proportion of hemopoietic bone marrow fat for the  patient's  age. No evidence of abnormal marrow signal is appreciated to suggest the  sequela of contusion or occult fracture. There is no linear signal  abnormality to suggest the presence of an acute fracture.  Mild  degenerative  subchondral cyst formation is appreciated within the lateral femoral  trochlea. Moderate degenerative osteophytosis is appreciated.   A moderate joint effusion is present. There is a small noninflammatory  Baker's cyst within the medial popliteal fossa. There is mild anterior  subcutaneous edema.  Anterior cruciate ligament and posterior cruciate  ligaments are unremarkable.  Medial collateral ligament and lateral  collateral ligamentous complexes as well as quadriceps tendon and patellar  ligaments are intact. There is degenerative deformity, volume loss and  intrasubstance signal abnormality throughout both menisci. There is no  meniscal linear signal abnormality to suggest tearing. There is near  complete degenerative loss of the medial patellar articular cartilage.  There is moderate diffuse thinning of the lateral joint compartment  articular cartilage. There is diffuse thinning of the medial joint  compartment articular cartilage. There are non-rounded free bodies in the  lateral suprapatellar bursa with the largest measuring 9 mm.  There is a  single 7 mm free body in the anterior intercondylar region.   IMPRESSION:   1.     Articular cartilage degeneration most pronounced in the lateral  patellar region.  2.     Numerous free intraarticular osteochondral bodies.  3.     Degenerative changes of both menisci with no evidence of a meniscal  tear.  4.     Moderate nonspecific joint effusion.   Thank you for the  opportunity to contribute to the care of your patient.     Knee-R DG 1-2 views:  Results for orders placed in visit on 02/16/13  DG Knee 1-2 Views Right   Narrative * PRIOR REPORT IMPORTED FROM AN EXTERNAL SYSTEM *   PRIOR REPORT IMPORTED FROM THE SYNGO WORKFLOW SYSTEM   REASON FOR EXAM:    postop  COMMENTS:   Bedside (portable):Y   PROCEDURE:     DXR - DXR KNEE RIGHT AP AND LATERAL  - Feb 16 2013  3:02PM   RESULT:     History: Post-op TKR   Findings:   AP and lateral views of the right knee demonstrates a total right knee  arthroplasty without evidence of hardware failure complication. There is  no  significant joint effusion. There is no fracture or dislocation. The  alignment is anatomic.   IMPRESSION:   Right total knee arthroplasty.   Dictation Site: 1       Note: Imaging results reviewed.  ROS  Cardiovascular History: Daily Aspirin intake and Hypertension Pulmonary or Respiratory History: Asthma Neurological History: Negative for epilepsy, stroke, urinary or fecal inontinence, spina bifida or tethered cord syndrome Review of Past Neurological Studies: No results found for this or  any previous visit. Psychological-Psychiatric History: Anxiety and Depression Gastrointestinal History: Reflux or heatburn Genitourinary History: Negative for nephrolithiasis, hematuria, renal failure or chronic kidney disease Hematological History: Brusing easily Endocrine History: Non-insulin-dependent diabetes mellitus Rheumatologic History: Rheumatoid arthritis Musculoskeletal History: Negative for myasthenia gravis, muscular dystrophy, multiple sclerosis or malignant hyperthermia Work History: Retired  Allergies  Ms. Nudo is allergic to lisinopril and lovastatin.  Laboratory Chemistry  Inflammation Markers Lab Results  Component Value Date   ESRSEDRATE 7 01/21/2016    Renal Function Lab Results  Component Value Date   BUN 9 01/21/2016   CREATININE 0.74 01/21/2016    GFRAA >60 01/21/2016   GFRNONAA >60 01/21/2016    Hepatic Function Lab Results  Component Value Date   AST 17 01/21/2016   ALT 15 01/21/2016   ALBUMIN 3.8 01/21/2016    Electrolytes Lab Results  Component Value Date   NA 140 01/21/2016   K 4.0 01/21/2016   CL 104 01/21/2016   CALCIUM 9.0 01/21/2016   MG 2.0 01/21/2016    Pain Modulating Vitamins No results found for: Maralyn Sago [id number], [id number], [id number], 25OHVITD1, 25OHVITD2, 25OHVITD3, VITAMINB12  Coagulation Parameters Lab Results  Component Value Date   INR 1.0 01/31/2013   LABPROT 13.2 01/31/2013   APTT 27.8 01/31/2013   PLT 288 10/25/2015    Cardiovascular Lab Results  Component Value Date   HGB 12.9 10/11/2013   HCT 42.1 10/25/2015    Note: Lab results reviewed.  [place]  Medical:  Ms. Crochet  has a past medical history of Asthma; Diabetes mellitus without complication ([place]); Hyperlipidemia; and Hypertension. Family: family history includes Anuerysm in her mother; CAD in her father; Dementia in her father and mother; Diabetes in her brother and father; Heart failure in her father; Lupus in her sister; Throat cancer in her maternal grandfather. Surgical:  has a past surgical history that includes Total knee arthroplasty (Right, 2014); Abdominal hysterectomy; and Total knee arthroplasty (Right, 02/16/2013). Tobacco:  reports that she has never smoked. She has never used smokeless tobacco. Alcohol:  reports that she does not drink alcohol. Drug:  reports that she does not use drugs. Active Ambulatory Problems    Diagnosis Date Noted  . Postherpetic neuralgia 11/22/2014  . Depression 01/10/2015  . BP (high blood pressure) 01/10/2015  . Obesity (BMI 35.0-39.9 without comorbidity) ([place]) 04/18/2014  . Neuropathic postherpetic trigeminal neuralgia (Right) (V1) 12/21/2013  . Anxiety 01/11/2015  . Asthma 02/07/2009  . Diabetes ([place]) 01/11/2015  . Photopsia 01/11/2015  . Benign essential HTN 07/06/2008  .  History of colon polyps 10/17/2005  . Calcium deficiency disease 10/16/2008  . Cannot sleep 03/13/2009  . Hypercholesterolemia without hypertriglyceridemia 07/06/2008  . Herpes 07/06/2008  . Avitaminosis A 01/11/2015  . Avitaminosis D 07/05/2008  . Gasserian ganglionitis 03/12/2015  . Neuropathic pain 03/12/2015  . Atypical facial pain (Right) 05/07/2015  . Chronic pain syndrome 05/07/2015  . Chronic pain 01/21/2016  . Encounter for long-term (current) use of medications 01/21/2016  . Disturbance of skin sensation 01/21/2016  . Occipital headache 01/21/2016  . Bilateral occipital neuralgia 01/21/2016   Resolved Ambulatory Problems    Diagnosis Date Noted  . No Resolved Ambulatory Problems   Past Medical History:  Diagnosis Date  . Asthma   . Diabetes mellitus without complication ([place])   . Hyperlipidemia   . Hypertension     Constitutional Exam  Vitals: Blood pressure (!) 148/96, pulse 88, temperature 98.5 F (36.9 C), resp. rate 18, height 5\' 3"  (1.6 m), weight 220 lb (  99.8 kg), SpO2 98 %. General appearance: Well nourished, well developed, and well hydrated. In no acute distress Calculated BMI/Body habitus: Body mass index is 38.97 kg/m.       Psych/Mental status: Alert and oriented x 3 (person, place, & time) Eyes: PERLA Respiratory: No evidence of acute respiratory distress  Cervical Spine Exam  Inspection: No masses, redness, or swelling Alignment: Symmetrical Functional ROM: ROM appears unrestricted Stability: No instability detected Muscle strength & Tone: Functionally intact Sensory: Unimpaired Palpation: Non-contributory  Upper Extremity (UE) Exam    Side: Right upper extremity  Side: Left upper extremity  Inspection: No masses, redness, swelling, or asymmetry  Inspection: No masses, redness, swelling, or asymmetry  Functional ROM: ROM appears unrestricted  Functional ROM: ROM appears unrestricted  Muscle strength & Tone: Functionally intact  Muscle  strength & Tone: Functionally intact  Sensory: Unimpaired  Sensory: Unimpaired  Palpation: Non-contributory  Palpation: Non-contributory   Thoracic Spine Exam  Inspection: No masses, redness, or swelling Alignment: Symmetrical Functional ROM: ROM appears unrestricted Stability: No instability detected Sensory: Unimpaired Muscle strength & Tone: Functionally intact Palpation: Non-contributory  Lumbar Spine Exam  Inspection: No masses, redness, or swelling Alignment: Symmetrical Functional ROM: ROM appears unrestricted Stability: No instability detected Muscle strength & Tone: Functionally intact Sensory: Unimpaired Palpation: Non-contributory Provocative Tests: Lumbar Hyperextension and rotation test: evaluation deferred today       Patrick's Maneuver: evaluation deferred today              Gait & Posture Assessment  Ambulation: Unassisted Gait: Relatively normal for age and body habitus Posture: WNL   Lower Extremity Exam    Side: Right lower extremity  Side: Left lower extremity  Inspection: No masses, redness, swelling, or asymmetry  Inspection: No masses, redness, swelling, or asymmetry  Functional ROM: ROM appears unrestricted  Functional ROM: ROM appears unrestricted  Muscle strength & Tone: Functionally intact  Muscle strength & Tone: Functionally intact  Sensory: Unimpaired  Sensory: Unimpaired  Palpation: Non-contributory  Palpation: Non-contributory    Assessment  Primary Diagnosis & Pertinent Problem List: The primary encounter diagnosis was Encounter for long-term (current) use of medications. Diagnoses of Chronic pain, Avitaminosis D, Neuropathic pain, Disturbance of skin sensation, Occipital headache, Bilateral occipital neuralgia, and Neuropathic postherpetic trigeminal neuralgia (Right) (V1) were also pertinent to this visit.  Visit Diagnosis: 1. Encounter for long-term (current) use of medications   2. Chronic pain   3. Avitaminosis D   4. Neuropathic  pain   5. Disturbance of skin sensation   6. Occipital headache   7. Bilateral occipital neuralgia   8. Neuropathic postherpetic trigeminal neuralgia (Right) (V1)     Assessment: No problem-specific Assessment & Plan notes found for this encounter.   Plan of Care  Initial Treatment Plan:  Please be advised that as per protocol, today's visit has been an evaluation only. We have not taken over the patient's controlled substance management.  Problem List Items Addressed This Visit      High   Bilateral occipital neuralgia (Chronic)   Relevant Orders   DG Cervical Spine Complete (Completed)   Chronic pain (Chronic)   Relevant Orders   Comprehensive metabolic panel (Completed)   C-reactive protein   Magnesium (Completed)   Sedimentation rate (Completed)   Ambulatory referral to Psychology   Neuropathic pain (Chronic)   Relevant Orders   Vitamin B12   Neuropathic postherpetic trigeminal neuralgia (Right) (V1) (Chronic)   Occipital headache (Chronic)   Relevant Orders   DG Cervical Spine  Complete (Completed)     Medium   Encounter for long-term (current) use of medications - Primary (Chronic)   Relevant Orders   Compliance Drug Analysis, Ur   Ambulatory referral to Psychology     Low   Avitaminosis D   Relevant Orders   25-Hydroxyvitamin D Lcms D2+D3   Disturbance of skin sensation (Chronic)   Relevant Orders   Vitamin B12    Other Visit Diagnoses   None.     Pharmacotherapy (Medications Ordered): No orders of the defined types were placed in this encounter.   Lab-work & Procedure Ordered: Orders Placed This Encounter  Procedures  . DG Cervical Spine Complete  . Compliance Drug Analysis, Ur  . Comprehensive metabolic panel  . C-reactive protein  . Magnesium  . Sedimentation rate  . Vitamin B12  . 25-Hydroxyvitamin D Lcms D2+D3  . Ambulatory referral to Psychology    Interventional Therapies: Scheduled:  None at this time.    Considering:    Diagnostic bilateral greater occipital nerve blocks under fluoroscopic guidance, with or without sedation.  Diagnostic right-sided stellate ganglion block under fluoroscopic guidance and IV sedation.  Possible bilateral greater occipital nerve radiofrequency ablation under fluoroscopic guidance and IV sedation the pending on the results of the diagnostic injection.  Possible bilateral greater occipital nerve peripheral nerve stimulator, depending on the results of the above therapy.  Transition the patient from the regular Neurontin to Horizon to determine if that formulation provides her with better relief of the pain.  If the gabapentin does not do the trick, we can do a pregabalin (Lyrica) trial.    PRN Procedures:  None at this time.    Referral(s) or Consult(s): Medical psychology consult for substance use disorder evaluation  Medications administered during this visit: Ms. Kafka had no medications administered during this visit.  Prescriptions ordered during this visit: New Prescriptions   No medications on file    Requested PM Follow-up: Return for After MedPsych Eval.  No future appointments.   Primary Care Physician: Sarah Rana, MD Location: [facility] Note by: Kathlen Brunswick. Dossie Arbour, M.D, DABA, DABAPM, DABPM, DABIPP, FIPP  Pain Score Disclaimer: We use the NRS-11 scale. This is a self-reported, subjective measurement of pain severity with only modest accuracy. It is used primarily to identify changes within a particular patient. It must be understood that outpatient pain scales are significantly less accurate that those used for research, where they can be applied under ideal controlled circumstances with minimal exposure to variables. In reality, the score is likely to be a combination of pain intensity and pain affect, where pain affect describes the degree of emotional arousal or changes in action readiness caused by the sensory experience  of pain. Factors such as social and work situation, setting, emotional state, anxiety levels, expectation, and prior pain experience may influence pain perception and show large inter-individual differences that may also be affected by time variables.  Patient instructions provided during this appointment: Patient Instructions  Please have x-ray, blood work and psychological evaluation completed as soon as possible prior to next appointment.

## 2016-01-21 NOTE — Patient Instructions (Signed)
Please have x-ray, blood work and psychological evaluation completed as soon as possible prior to next appointment.

## 2016-01-21 NOTE — Progress Notes (Signed)
Safety precautions to be maintained throughout the outpatient stay will include: orient to surroundings, keep bed in low position, maintain call bell within reach at all times, provide assistance with transfer out of bed and ambulation.  Patient went to [facility] clinic 5 months ago.

## 2016-01-22 ENCOUNTER — Telehealth: Payer: Self-pay | Admitting: *Deleted

## 2016-01-22 NOTE — Telephone Encounter (Signed)
Please have pt to sign a consent for medical release of information to obtain Prior procedures from [facility].

## 2016-01-24 DIAGNOSIS — M1712 Unilateral primary osteoarthritis, left knee: Secondary | ICD-10-CM | POA: Diagnosis not present

## 2016-01-24 DIAGNOSIS — M25562 Pain in left knee: Secondary | ICD-10-CM | POA: Diagnosis not present

## 2016-01-24 LAB — 25-HYDROXYVITAMIN D LCMS D2+D3
25-HYDROXY, VITAMIN D-2: 6.4 ng/mL
25-HYDROXY, VITAMIN D-3: 33 ng/mL

## 2016-01-24 LAB — 25-HYDROXY VITAMIN D LCMS D2+D3: 25-Hydroxy, Vitamin D: 39 ng/mL

## 2016-01-27 LAB — COMPLIANCE DRUG ANALYSIS, UR: PDF: 0

## 2016-01-31 DIAGNOSIS — R262 Difficulty in walking, not elsewhere classified: Secondary | ICD-10-CM | POA: Diagnosis not present

## 2016-01-31 DIAGNOSIS — M1712 Unilateral primary osteoarthritis, left knee: Secondary | ICD-10-CM | POA: Diagnosis not present

## 2016-01-31 DIAGNOSIS — M25562 Pain in left knee: Secondary | ICD-10-CM | POA: Diagnosis not present

## 2016-02-01 ENCOUNTER — Encounter: Payer: Self-pay | Admitting: Family Medicine

## 2016-02-07 DIAGNOSIS — M1712 Unilateral primary osteoarthritis, left knee: Secondary | ICD-10-CM | POA: Diagnosis not present

## 2016-02-07 DIAGNOSIS — M25562 Pain in left knee: Secondary | ICD-10-CM | POA: Diagnosis not present

## 2016-02-14 DIAGNOSIS — M1712 Unilateral primary osteoarthritis, left knee: Secondary | ICD-10-CM | POA: Diagnosis not present

## 2016-02-14 DIAGNOSIS — M25562 Pain in left knee: Secondary | ICD-10-CM | POA: Diagnosis not present

## 2016-02-19 DIAGNOSIS — F4542 Pain disorder with related psychological factors: Secondary | ICD-10-CM | POA: Diagnosis not present

## 2016-02-25 DIAGNOSIS — M25562 Pain in left knee: Secondary | ICD-10-CM | POA: Diagnosis not present

## 2016-02-25 DIAGNOSIS — M1712 Unilateral primary osteoarthritis, left knee: Secondary | ICD-10-CM | POA: Diagnosis not present

## 2016-02-26 ENCOUNTER — Telehealth: Payer: Self-pay | Admitting: Family Medicine

## 2016-02-29 ENCOUNTER — Ambulatory Visit (INDEPENDENT_AMBULATORY_CARE_PROVIDER_SITE_OTHER): Payer: Medicare Other | Admitting: Family Medicine

## 2016-02-29 ENCOUNTER — Encounter: Payer: Self-pay | Admitting: Family Medicine

## 2016-02-29 VITALS — BP 130/98 | HR 88 | Temp 97.9°F | Resp 14 | Wt 227.4 lb

## 2016-02-29 DIAGNOSIS — R928 Other abnormal and inconclusive findings on diagnostic imaging of breast: Secondary | ICD-10-CM | POA: Diagnosis not present

## 2016-02-29 DIAGNOSIS — B0222 Postherpetic trigeminal neuralgia: Secondary | ICD-10-CM

## 2016-02-29 DIAGNOSIS — E7800 Pure hypercholesterolemia, unspecified: Secondary | ICD-10-CM

## 2016-02-29 DIAGNOSIS — Z23 Encounter for immunization: Secondary | ICD-10-CM | POA: Diagnosis not present

## 2016-02-29 DIAGNOSIS — I10 Essential (primary) hypertension: Secondary | ICD-10-CM | POA: Diagnosis not present

## 2016-02-29 DIAGNOSIS — R921 Mammographic calcification found on diagnostic imaging of breast: Secondary | ICD-10-CM

## 2016-02-29 DIAGNOSIS — E0800 Diabetes mellitus due to underlying condition with hyperosmolarity without nonketotic hyperglycemic-hyperosmolar coma (NKHHC): Secondary | ICD-10-CM

## 2016-02-29 NOTE — Progress Notes (Signed)
Patient: [name] Female    DOB: [birth year]   65 y.o.   MRN: [id number] Visit Date: 02/29/2016  Today's Provider: Vernie Murders, PA   Chief Complaint  Patient presents with  . Diabetes  . Hyperlipidemia  . Hypertension  . Follow-up   Subjective:    HPI  Diabetes Mellitus Type II, Follow-up:   Lab Results  Component Value Date   HGBA1C 6.5 (H) 10/25/2015   HGBA1C 6.4 (H) 05/08/2015   HGBA1C 6.6 (H) 01/17/2015   Last seen for diabetes 4 months ago.  Management since then includes continue Metformin. She reports excellent compliance with treatment. She is not having side effects.  Current symptoms include none and have been stable. Home blood sugar records: not being checked  Episodes of hypoglycemia? Unknown    Current Insulin Regimen: none Weight trend: stable Current diet: well balanced Current exercise: none  ------------------------------------------------------------------------   Hypertension, follow-up:  BP Readings from Last 3 Encounters:  01/21/16 (!) 148/96  10/25/15 (!) 142/90  06/28/15 138/90    She was last seen for hypertension 4 months ago.  BP at that visit was 142/90. Management since that visit includes continue Amlodipine and HCTZ.She reports excellent compliance with treatment. She is not having side effects.  She is not exercising. She is adherent to low salt diet.   Outside blood pressures are not being checked. She is experiencing none.  Patient denies none.   Cardiovascular risk factors include advanced age (older than 49 for men, 23 for women), diabetes mellitus, dyslipidemia and hypertension.  Use of agents associated with hypertension: none.   ------------------------------------------------------------------------    Lipid/Cholesterol, Follow-up:   Last seen for this 4 months ago.  Management since that visit includes continue Pravastatin.  Last Lipid Panel:    Component Value Date/Time   CHOL 160 10/25/2015  0946   CHOL 210 (H) 11/03/2013 0921   TRIG 125 10/25/2015 0946   TRIG 97 11/03/2013 0921   HDL 47 10/25/2015 0946   HDL 50 11/03/2013 0921   CHOLHDL 3.4 10/25/2015 0946   VLDL 19 11/03/2013 0921   LDLCALC 88 10/25/2015 0946   LDLCALC 141 (H) 11/03/2013 0921    She reports excellent compliance with treatment. She is not having side effects.   Wt Readings from Last 3 Encounters:  01/21/16 220 lb (99.8 kg)  10/25/15 225 lb 12.8 oz (102.4 kg)  06/28/15 227 lb (103 kg)    ------------------------------------------------------------------------  Past Medical History:  Diagnosis Date  . Asthma   . Diabetes mellitus without complication ([place])   . Hyperlipidemia   . Hypertension    Past Surgical History:  Procedure Laterality Date  . ABDOMINAL HYSTERECTOMY    . TOTAL KNEE ARTHROPLASTY Right 2014  . TOTAL KNEE ARTHROPLASTY Right 02/16/2013   Family History  Problem Relation Age of Onset  . Anuerysm Mother     Brain  . Dementia Mother   . Heart failure Father   . CAD Father   . Dementia Father   . Diabetes Father   . Lupus Sister   . Diabetes Brother   . Throat cancer Maternal Grandfather    Allergies  Allergen Reactions  . Lisinopril Cough  . Lovastatin Other (See Comments)    Pain     Previous Medications   ALBUTEROL (PROVENTIL HFA;VENTOLIN HFA) 108 (90 BASE) MCG/ACT INHALER    Inhale 2 puffs into the lungs every 6 (six) hours as needed.    AMLODIPINE (NORVASC) 5 MG TABLET  Take 1 tablet (5 mg total) by mouth daily.   ASPIRIN EC 81 MG TABLET    Take 81 mg by mouth daily.    BREO ELLIPTA 200-25 MCG/INH AEPB    inhale 1 dose once daily   GABAPENTIN (NEURONTIN) 600 MG TABLET    Take 6 tablets by mouth daily   HYDROCHLOROTHIAZIDE (MICROZIDE) 12.5 MG CAPSULE    Take 1 capsule (12.5 mg total) by mouth at bedtime.   LANSOPRAZOLE (PREVACID) 30 MG CAPSULE    Take 30 mg by mouth daily at 12 noon.   METFORMIN (GLUCOPHAGE) 500 MG TABLET    Take 1 tablet (500 mg total) by  mouth daily with breakfast.   OLOPATADINE HCL 0.7 % SOLN    Apply 1 drop to eye daily.   PRAVASTATIN (PRAVACHOL) 20 MG TABLET    Take 1 tablet (20 mg total) by mouth at bedtime.   TRAZODONE (DESYREL) 50 MG TABLET    Take 0.5-1 tablets (25-50 mg total) by mouth at bedtime as needed for sleep.    Review of Systems  Constitutional: Negative.   Respiratory: Negative.   Cardiovascular: Negative.   Gastrointestinal: Negative.   Endocrine: Negative.     Social History  Substance Use Topics  . Smoking status: Never Smoker  . Smokeless tobacco: Never Used  . Alcohol use No   Objective:   BP (!) 130/98 (BP Location: Right Arm, Patient Position: Sitting, Cuff Size: Large)   Pulse 88   Temp 97.9 F (36.6 C) (Oral)   Resp 14   Wt 227 lb 6.4 oz (103.1 kg)   SpO2 96%   BMI 40.28 kg/m   Physical Exam  Constitutional: She is oriented to person, place, and time. She appears well-developed and well-nourished. No distress.  HENT:  Head: Normocephalic and atraumatic.  Right Ear: Hearing normal.  Left Ear: Hearing normal.  Nose: Nose normal.  Eyes: Conjunctivae and lids are normal. Right eye exhibits no discharge. Left eye exhibits no discharge. No scleral icterus.  Neck: Neck supple.  Cardiovascular: Normal rate and regular rhythm.   Pulmonary/Chest: Effort normal and breath sounds normal. No respiratory distress.  Abdominal: Soft. Bowel sounds are normal.  Musculoskeletal: Normal range of motion. She exhibits no edema.  Neurological: She is alert and oriented to person, place, and time.  Skin: Skin is intact. No lesion and no rash noted.  Psychiatric: She has a normal mood and affect. Her speech is normal and behavior is normal. Thought content normal.      Assessment & Plan:     1. Diabetes mellitus due to underlying condition with hyperosmolarity without coma, without long-term current use of insulin (HCC)  Tolerating Metformin 500 mg qd and trying to follow diabetic diet. Will  recheck labs and encourage to exercise as asthma and chronic facial pain allows. Follow up pending reports. - CBC with Differential/Platelet - Comprehensive metabolic panel - Hemoglobin A1c  2. Hypercholesterolemia without hypertriglyceridemia Taking Pravastatin 20 mg qd without myalgias or joint pains. Recheck labs. - CBC with Differential/Platelet - Comprehensive metabolic panel - Lipid panel  3. Benign essential HTN Elevated BP. Continue Amlodipine 5 mg qd and increase HCTZ to 25 mg qd. Recheck routine labs and follow up in 1 month. - CBC with Differential/Platelet - Comprehensive metabolic panel  4. Neuropathic postherpetic trigeminal neuralgia (Right) (V1) Continues regular follow up at pain management clinic with Dr. Dossie Arbour.  5. Breast calcifications on mammogram Had biopsy with path report of calcified fibromas. Radiologist recommended follow up  mammograms now. Will schedule. - MM Digital Diagnostic Bilat  6. Needs flu shot Given influenza vaccination today.

## 2016-03-01 LAB — COMPREHENSIVE METABOLIC PANEL
A/G RATIO: 1.2 (ref 1.2–2.2)
ALBUMIN: 4 g/dL (ref 3.6–4.8)
ALT: 13 IU/L (ref 0–32)
AST: 13 IU/L (ref 0–40)
Alkaline Phosphatase: 94 IU/L (ref 39–117)
BILIRUBIN TOTAL: 0.5 mg/dL (ref 0.0–1.2)
BUN / CREAT RATIO: 15 (ref 12–28)
BUN: 14 mg/dL (ref 8–27)
CALCIUM: 9.7 mg/dL (ref 8.7–10.3)
CHLORIDE: 97 mmol/L (ref 96–106)
CO2: 22 mmol/L (ref 18–29)
Creatinine, Ser: 0.94 mg/dL (ref 0.57–1.00)
GFR calc non Af Amer: 63 mL/min/{1.73_m2} (ref >59)
GFR, EST AFRICAN AMERICAN: 73 mL/min/{1.73_m2} (ref >59)
Globulin, Total: 3.3 g/dL (ref 1.5–4.5)
Glucose: 99 mg/dL (ref 65–99)
POTASSIUM: 3.8 mmol/L (ref 3.5–5.2)
SODIUM: 138 mmol/L (ref 134–144)
TOTAL PROTEIN: 7.3 g/dL (ref 6.0–8.5)

## 2016-03-01 LAB — CBC WITH DIFFERENTIAL/PLATELET
BASOS: 0 %
Basophils Absolute: 0 10*3/uL (ref 0.0–0.2)
EOS (ABSOLUTE): 0.1 10*3/uL (ref 0.0–0.4)
EOS: 1 %
HEMOGLOBIN: 13.8 g/dL (ref 11.1–15.9)
Hematocrit: 42.1 % (ref 34.0–46.6)
IMMATURE GRANS (ABS): 0.1 10*3/uL (ref 0.0–0.1)
IMMATURE GRANULOCYTES: 1 %
LYMPHS: 18 %
Lymphocytes Absolute: 2.3 10*3/uL (ref 0.7–3.1)
MCH: 26.7 pg (ref 26.6–33.0)
MCHC: 32.8 g/dL (ref 31.5–35.7)
MCV: 81 fL (ref 79–97)
MONOCYTES: 7 %
Monocytes Absolute: 0.9 10*3/uL (ref 0.1–0.9)
NEUTROS ABS: 9.1 10*3/uL — AB (ref 1.4–7.0)
NEUTROS PCT: 73 %
Platelets: 279 10*3/uL (ref 150–379)
RBC: 5.17 x10E6/uL (ref 3.77–5.28)
RDW: 13.5 % (ref 12.3–15.4)
WBC: 12.6 10*3/uL — ABNORMAL HIGH (ref 3.4–10.8)

## 2016-03-01 LAB — HEMOGLOBIN A1C
Est. average glucose Bld gHb Est-mCnc: 148 mg/dL
Hgb A1c MFr Bld: 6.8 % — ABNORMAL HIGH (ref 4.8–5.6)

## 2016-03-01 LAB — LIPID PANEL
CHOL/HDL RATIO: 3.8 ratio (ref 0.0–4.4)
Cholesterol, Total: 181 mg/dL (ref 100–199)
HDL: 48 mg/dL (ref >39)
LDL CALC: 108 mg/dL — AB (ref 0–99)
Triglycerides: 124 mg/dL (ref 0–149)
VLDL Cholesterol Cal: 25 mg/dL (ref 5–40)

## 2016-03-03 ENCOUNTER — Telehealth: Payer: Self-pay | Admitting: Family Medicine

## 2016-03-03 DIAGNOSIS — R921 Mammographic calcification found on diagnostic imaging of breast: Secondary | ICD-10-CM

## 2016-03-03 NOTE — Telephone Encounter (Signed)
[facility] states the order for bilateral diagnostic is incorrect.They need an order for diagnostic uni right.[id number]

## 2016-03-04 NOTE — Telephone Encounter (Signed)
Changed order to diagnostic unilateral mammograms to follow up calcifications with benign biopsy.

## 2016-03-05 NOTE — Telephone Encounter (Signed)
Ordered. Fredderick Swanger Drozdowski, CMA  

## 2016-03-11 ENCOUNTER — Other Ambulatory Visit: Payer: Self-pay | Admitting: Family Medicine

## 2016-03-11 DIAGNOSIS — D72829 Elevated white blood cell count, unspecified: Secondary | ICD-10-CM

## 2016-03-11 NOTE — Telephone Encounter (Signed)
Patient advised. Patient reports she does not have any signs of infection. Patient said she does have cold symptoms after receiving the flu shot. Patient will call back the end of the week for a lab slip to recheck WBC.

## 2016-03-11 NOTE — Telephone Encounter (Signed)
Will leave future order for CBC in the chart.

## 2016-03-11 NOTE — Telephone Encounter (Signed)
-----   Message from [place], [place] sent at 03/03/2016  8:49 AM EDT ----- Some elevation of WBC count like last November. If any signs of infection (respiratory or urinary), will give antibiotic and recheck levels in 10-14 days. All other tests in good shape. Hgb A1C is below 7. Continue present medications and recheck as planned in October.

## 2016-03-16 HISTORY — PX: BREAST LUMPECTOMY: SHX2

## 2016-03-18 ENCOUNTER — Ambulatory Visit: Payer: Medicare Other

## 2016-03-20 ENCOUNTER — Ambulatory Visit: Payer: Medicare Other

## 2016-03-21 ENCOUNTER — Other Ambulatory Visit: Payer: Self-pay | Admitting: Family Medicine

## 2016-03-21 ENCOUNTER — Ambulatory Visit
Admission: RE | Admit: 2016-03-21 | Discharge: 2016-03-21 | Disposition: A | Discharge status: Home / Self Care | Payer: Medicare Other | Source: Ambulatory Visit | Attending: Family Medicine | Admitting: Family Medicine

## 2016-03-21 DIAGNOSIS — R921 Mammographic calcification found on diagnostic imaging of breast: Secondary | ICD-10-CM | POA: Diagnosis not present

## 2016-03-21 DIAGNOSIS — N6310 Unspecified lump in the right breast, unspecified quadrant: Secondary | ICD-10-CM

## 2016-03-21 DIAGNOSIS — R928 Other abnormal and inconclusive findings on diagnostic imaging of breast: Secondary | ICD-10-CM

## 2016-03-21 DIAGNOSIS — N6489 Other specified disorders of breast: Secondary | ICD-10-CM | POA: Diagnosis not present

## 2016-03-24 ENCOUNTER — Telehealth: Payer: Self-pay

## 2016-03-24 NOTE — Telephone Encounter (Signed)
-----   Message from [place], [place] sent at 03/21/2016  6:02 PM EDT ----- Lump noted in the lower inner quadrant of the right breast. Proceed with stereotactic guided core biopsy.

## 2016-03-24 NOTE — Telephone Encounter (Signed)
Patient advised.

## 2016-03-28 ENCOUNTER — Other Ambulatory Visit: Payer: Self-pay

## 2016-03-28 ENCOUNTER — Other Ambulatory Visit: Payer: Self-pay | Admitting: Family Medicine

## 2016-03-28 ENCOUNTER — Encounter: Payer: Self-pay | Admitting: Family Medicine

## 2016-03-28 ENCOUNTER — Ambulatory Visit (INDEPENDENT_AMBULATORY_CARE_PROVIDER_SITE_OTHER): Payer: Medicare Other | Admitting: Family Medicine

## 2016-03-28 VITALS — BP 132/67 | HR 86 | Temp 98.2°F | Resp 16 | Wt 226.8 lb

## 2016-03-28 DIAGNOSIS — J069 Acute upper respiratory infection, unspecified: Secondary | ICD-10-CM | POA: Diagnosis not present

## 2016-03-28 DIAGNOSIS — D72829 Elevated white blood cell count, unspecified: Secondary | ICD-10-CM | POA: Diagnosis not present

## 2016-03-28 DIAGNOSIS — I10 Essential (primary) hypertension: Secondary | ICD-10-CM | POA: Diagnosis not present

## 2016-03-28 DIAGNOSIS — M6282 Rhabdomyolysis: Secondary | ICD-10-CM | POA: Diagnosis not present

## 2016-03-28 DIAGNOSIS — T466X5A Adverse effect of antihyperlipidemic and antiarteriosclerotic drugs, initial encounter: Secondary | ICD-10-CM

## 2016-03-28 MED ORDER — HYDROCHLOROTHIAZIDE 25 MG PO TABS: 25.0000 mg | ORAL_TABLET | Freq: Every day | ORAL | 3 refills | Status: DC

## 2016-03-28 MED ORDER — PSEUDOEPH-BROMPHEN-DM 30-2-10 MG/5ML PO SYRP: 5.0000 mL | ORAL_SOLUTION | Freq: Four times a day (QID) | ORAL | 0 refills | Status: DC | PRN

## 2016-03-28 NOTE — Progress Notes (Signed)
Patient: [name] Female    DOB: 1950/12/20   65 y.o.   MRN: [id number] Visit Date: 03/28/2016  Today's Provider: Vernie Murders, PA   Chief Complaint  Patient presents with  . Hypertension  . Follow-up   Subjective:    HPI  Hypertension, follow-up:  BP Readings from Last 3 Encounters:  02/29/16 (!) 130/98  01/21/16 (!) 148/96  10/25/15 (!) 142/90    She was last seen for hypertension 1 months ago.  BP at that visit was 130/98. Management changes since that visit include continue Amlodipine 5 mg and increase HCTZ to 25 mg.. She reports excellent compliance with treatment. She is not having side effects.  She is not exercising. She is adherent to low salt diet.   Outside blood pressures are not being checked. She is experiencing none.  Patient denies chest pain, irregular heart beat and palpitations.   Cardiovascular risk factors include advanced age (older than 35 for men, 46 for women), diabetes mellitus, dyslipidemia and hypertension.  Use of agents associated with hypertension: none.     Weight trend: stable Wt Readings from Last 3 Encounters:  02/29/16 227 lb 6.4 oz (103.1 kg)  01/21/16 220 lb (99.8 kg)  10/25/15 225 lb 12.8 oz (102.4 kg)    Current diet: in general, a "healthy" diet    ------------------------------------------------------------------------     Past Medical History:  Diagnosis Date  . Asthma   . Diabetes mellitus without complication ([place])   . Hyperlipidemia   . Hypertension    Past Surgical History:  Procedure Laterality Date  . ABDOMINAL HYSTERECTOMY    . TOTAL KNEE ARTHROPLASTY Right 2014  . TOTAL KNEE ARTHROPLASTY Right 02/16/2013   Family History  Problem Relation Age of Onset  . Anuerysm Mother     Brain  . Dementia Mother   . Heart failure Father   . CAD Father   . Dementia Father   . Diabetes Father   . Lupus Sister   . Diabetes Brother   . Throat cancer Maternal Grandfather    Allergies  Allergen  Reactions  . Lisinopril Cough  . Lovastatin Other (See Comments)    Pain   Previous Medications   AMLODIPINE (NORVASC) 5 MG TABLET    Take 1 tablet (5 mg total) by mouth daily.   ASPIRIN EC 81 MG TABLET    Take 81 mg by mouth daily.    BREO ELLIPTA 200-25 MCG/INH AEPB    inhale 1 dose once daily   GABAPENTIN (NEURONTIN) 600 MG TABLET    Take 6 tablets by mouth daily   HYDROCHLOROTHIAZIDE (MICROZIDE) 12.5 MG CAPSULE    Take 1 capsule (12.5 mg total) by mouth at bedtime.   LANSOPRAZOLE (PREVACID) 30 MG CAPSULE    Take 30 mg by mouth daily at 12 noon.   METFORMIN (GLUCOPHAGE) 500 MG TABLET    Take 1 tablet (500 mg total) by mouth daily with breakfast.   OLOPATADINE HCL 0.7 % SOLN    Apply 1 drop to eye daily.   TRAZODONE (DESYREL) 50 MG TABLET    Take 0.5-1 tablets (25-50 mg total) by mouth at bedtime as needed for sleep.    Review of Systems  Constitutional: Negative.   Respiratory: Negative.   Cardiovascular: Negative.     Social History  Substance Use Topics  . Smoking status: Never Smoker  . Smokeless tobacco: Never Used  . Alcohol use No   Objective:   BP 132/67 (BP Location: Left Arm, Patient  Position: Sitting, Cuff Size: Large)   Pulse 86   Temp 98.2 F (36.8 C) (Oral)   Resp 16   Wt 226 lb 12.8 oz (102.9 kg)   BMI 40.18 kg/m  Wt Readings from Last 3 Encounters:  03/28/16 226 lb 12.8 oz (102.9 kg)  02/29/16 227 lb 6.4 oz (103.1 kg)  01/21/16 220 lb (99.8 kg)    Physical Exam  Constitutional: She is oriented to person, place, and time. She appears well-developed and well-nourished.  HENT:  Head: Normocephalic.  Right Ear: External ear normal.  Left Ear: External ear normal.  Slight cobblestoning of posterior pharynx. Very tender right forehead from postherpetic neuralgia.  Eyes: Conjunctivae and EOM are normal.  Neck: Neck supple.  Cardiovascular: Normal rate and regular rhythm.   Pulmonary/Chest: Effort normal and breath sounds normal.  Abdominal: Soft.  Bowel sounds are normal.  Lymphadenopathy:    She has no cervical adenopathy.  Neurological: She is alert and oriented to person, place, and time.      Assessment & Plan:     1. Leukocytosis, unspecified type WBC count on CBC was 12,600 with increase in neutrophils 1 month ago. Will recheck. Admits to some cough and PND recently. No fever or purulent sputum production. If still elevated, will need antibiotic. Prefers not to take any more medications. - CBC with Differential/Platelet - Comprehensive metabolic panel  2. Upper respiratory tract infection, unspecified type Onset with cough and PND over the past 10-14 days. No sore throat, earache, rhinorrhea or sneezing. No fever but having a slight ticklish cough at night. Will treat with Bromfed-DM and check CBC for signs of infection. - CBC with Differential/Platelet  3. Benign essential HTN Much improved and no edema today. Refill HCTZ at 25 mg qd and continue Amlodipine 5 mg qd. Recheck pending lab reports. - hydrochlorothiazide (HYDRODIURIL) 25 MG tablet; Take 1 tablet (25 mg total) by mouth daily.  Dispense: 90 tablet; Refill: 3 - CBC with Differential/Platelet - Comprehensive metabolic panel - CK (Creatine Kinase)  4. Statin-induced rhabdomyolysis Having muscle and arm pains using the Pravastatin. Same as past use of Lovastatin. Discontinue all statins and recheck labs for signs of muscle destruction. - CBC with Differential/Platelet - CK (Creatine Kinase)

## 2016-03-28 NOTE — Progress Notes (Signed)
Requested prescription to be sent to mail order pharmacy.

## 2016-03-29 LAB — CBC WITH DIFFERENTIAL/PLATELET
BASOS ABS: 0 10*3/uL (ref 0.0–0.2)
Basos: 0 %
EOS (ABSOLUTE): 0.3 10*3/uL (ref 0.0–0.4)
EOS: 3 %
Hematocrit: 41.8 % (ref 34.0–46.6)
Hemoglobin: 14.2 g/dL (ref 11.1–15.9)
IMMATURE GRANULOCYTES: 1 %
Immature Grans (Abs): 0.1 10*3/uL (ref 0.0–0.1)
LYMPHS ABS: 2.8 10*3/uL (ref 0.7–3.1)
Lymphs: 26 %
MCH: 26.9 pg (ref 26.6–33.0)
MCHC: 34 g/dL (ref 31.5–35.7)
MCV: 79 fL (ref 79–97)
MONOS ABS: 0.8 10*3/uL (ref 0.1–0.9)
Monocytes: 7 %
NEUTROS PCT: 63 %
Neutrophils Absolute: 6.8 10*3/uL (ref 1.4–7.0)
PLATELETS: 291 10*3/uL (ref 150–379)
RBC: 5.28 x10E6/uL (ref 3.77–5.28)
RDW: 13.7 % (ref 12.3–15.4)
WBC: 10.8 10*3/uL (ref 3.4–10.8)

## 2016-03-29 LAB — COMPREHENSIVE METABOLIC PANEL
ALK PHOS: 88 IU/L (ref 39–117)
ALT: 13 IU/L (ref 0–32)
AST: 15 IU/L (ref 0–40)
Albumin/Globulin Ratio: 1.3 (ref 1.2–2.2)
Albumin: 4.1 g/dL (ref 3.6–4.8)
BUN/Creatinine Ratio: 13 (ref 12–28)
BUN: 12 mg/dL (ref 8–27)
Bilirubin Total: 0.6 mg/dL (ref 0.0–1.2)
CALCIUM: 9.5 mg/dL (ref 8.7–10.3)
CO2: 27 mmol/L (ref 18–29)
CREATININE: 0.96 mg/dL (ref 0.57–1.00)
Chloride: 98 mmol/L (ref 96–106)
GFR calc Af Amer: 71 mL/min/{1.73_m2} (ref >59)
GFR, EST NON AFRICAN AMERICAN: 61 mL/min/{1.73_m2} (ref >59)
GLOBULIN, TOTAL: 3.1 g/dL (ref 1.5–4.5)
GLUCOSE: 110 mg/dL — AB (ref 65–99)
Potassium: 3.8 mmol/L (ref 3.5–5.2)
SODIUM: 141 mmol/L (ref 134–144)
Total Protein: 7.2 g/dL (ref 6.0–8.5)

## 2016-03-29 LAB — CK: Total CK: 128 U/L (ref 24–173)

## 2016-03-31 ENCOUNTER — Telehealth: Payer: Self-pay

## 2016-03-31 NOTE — Telephone Encounter (Signed)
Patient advised.

## 2016-03-31 NOTE — Telephone Encounter (Signed)
-----   Message from [place], [place] sent at 03/30/2016 11:56 PM EDT ----- No abnormal creatine kinase to indicate significant muscle damage from statin use. Should stop the Pravastatin and recheck cholesterol in 3 months. Normal WBC count. No indication of infection. May use Mucinex-DM if needed for cough or congestion. Suspect viral or allergy congestion with cough. Recheck prn.

## 2016-04-02 ENCOUNTER — Encounter: Payer: Self-pay | Admitting: Pain Medicine

## 2016-04-02 ENCOUNTER — Ambulatory Visit: Payer: Medicare Other | Attending: Pain Medicine | Admitting: Pain Medicine

## 2016-04-02 VITALS — BP 142/80 | HR 84 | Temp 98.0°F | Resp 16 | Ht 63.0 in | Wt 220.0 lb

## 2016-04-02 DIAGNOSIS — G501 Atypical facial pain: Secondary | ICD-10-CM

## 2016-04-02 DIAGNOSIS — Z7982 Long term (current) use of aspirin: Secondary | ICD-10-CM | POA: Diagnosis not present

## 2016-04-02 DIAGNOSIS — Z96651 Presence of right artificial knee joint: Secondary | ICD-10-CM | POA: Diagnosis not present

## 2016-04-02 DIAGNOSIS — J45909 Unspecified asthma, uncomplicated: Secondary | ICD-10-CM | POA: Diagnosis not present

## 2016-04-02 DIAGNOSIS — E119 Type 2 diabetes mellitus without complications: Secondary | ICD-10-CM | POA: Insufficient documentation

## 2016-04-02 DIAGNOSIS — B0222 Postherpetic trigeminal neuralgia: Secondary | ICD-10-CM | POA: Diagnosis not present

## 2016-04-02 DIAGNOSIS — E7800 Pure hypercholesterolemia, unspecified: Secondary | ICD-10-CM | POA: Insufficient documentation

## 2016-04-02 DIAGNOSIS — Z79899 Other long term (current) drug therapy: Secondary | ICD-10-CM | POA: Insufficient documentation

## 2016-04-02 DIAGNOSIS — I10 Essential (primary) hypertension: Secondary | ICD-10-CM | POA: Insufficient documentation

## 2016-04-02 DIAGNOSIS — Z9889 Other specified postprocedural states: Secondary | ICD-10-CM | POA: Diagnosis not present

## 2016-04-02 DIAGNOSIS — Z7984 Long term (current) use of oral hypoglycemic drugs: Secondary | ICD-10-CM | POA: Diagnosis not present

## 2016-04-02 DIAGNOSIS — E669 Obesity, unspecified: Secondary | ICD-10-CM | POA: Insufficient documentation

## 2016-04-02 DIAGNOSIS — M545 Low back pain: Secondary | ICD-10-CM | POA: Diagnosis not present

## 2016-04-02 DIAGNOSIS — Z86010 Personal history of colonic polyps: Secondary | ICD-10-CM | POA: Diagnosis not present

## 2016-04-02 DIAGNOSIS — M792 Neuralgia and neuritis, unspecified: Secondary | ICD-10-CM

## 2016-04-02 MED ORDER — NONFORMULARY OR COMPOUNDED ITEM: 0 refills | Status: DC

## 2016-04-02 NOTE — Progress Notes (Signed)
Patient's Name: [name]  MRN: [id number]  Referring Provider: Margarita Rana, MD  DOB: 1951/06/15  PCP: Vernie Murders, PA  DOS: 04/02/2016  Note by: Kathlen Brunswick. Dossie Arbour, MD  Service setting: Ambulatory outpatient  Specialty: Interventional Pain Management  Location: [facility] (AMB) Pain Management Facility    Patient type: Established   Primary Reason(s) for Visit: Encounter for evaluation before starting new chronic pain management plan of care (Level of risk: moderate) CC: Headache (front- right) and Back Pain (lower-- does not radiate)  HPI  [name] is a 65 y.o. year old, female patient, who comes today for an initial evaluation. She has Postherpetic neuralgia; Depression; BP (high blood pressure); Obesity (BMI 35.0-39.9 without comorbidity) ([place]); Neuropathic postherpetic trigeminal neuralgia (Right) (V1); Anxiety; Asthma; Diabetes ([place]); Photopsia; Benign essential HTN; History of colon polyps; Calcium deficiency disease; Cannot sleep; Hypercholesterolemia without hypertriglyceridemia; Herpes; Avitaminosis A; Avitaminosis D; Gasserian ganglionitis; Neuropathic pain; Atypical facial pain (Right); Chronic pain syndrome; Chronic pain; Encounter for long-term (current) use of medications; Disturbance of skin sensation; Occipital headache; and Bilateral occipital neuralgia on her problem list.. Her primarily concern today is the Headache (front- right) and Back Pain (lower-- does not radiate)  Pain Assessment: Self-Reported Pain Score: 2 /10             Reported level is compatible with observation.       Pain Type: Chronic pain Pain Location: Head Pain Orientation: Right Pain Descriptors / Indicators: Aching, Constant, Burning, Sore (pt says its from shingles) Pain Frequency: Constant  [name] comes in today for a follow-up visit after her initial evaluation on 01/21/2016. Today we went over the results of her tests. These were explained in "Layman's terms". During today's appointment  we went over my diagnostic impression, as well as the proposed treatment plan.  In considering the treatment plan options, [name] was reminded that I no longer take patients for medication management only. I asked the patient to let me know if she had no intention of taking advantage of the interventional portion of my practice, so that I could then offer the space to someone interested in my particular skills. I made it clear that I do not want patients undergoing interventional treatments, just because they feel it may lead to opioid therapy. Nothing can be further from the truth as these therapies are ment to decrease the pain, so as to either avoid the use of controlled substances, or to taper them down for the purpose of eventually stopping them.  Controlled Substance Pharmacotherapy Assessment REMS (Risk Evaluation and Mitigation Strategy)  Analgesic: Hydrocodone/APAP 7.5/325 one Q6 hours when necessary for pain. MME/day: 30 mg/day Pill Count: None expected due to no prior prescriptions written by our practice. Pharmacokinetics: Onset of action (Liberation/Absorption): Within expected pharmacological parameters Time to Peak effect (Distribution): Timing and results are as within normal expected parameters Duration of action (Metabolism/Excretion): Within normal limits for medication Pharmacodynamics: Analgesic Effect: More than 50% Activity Facilitation: Medication(s) allow patient to sit, stand, walk, and do the basic ADLs Perceived Effectiveness: Described as relatively effective, allowing for increase in activities of daily living (ADL) Side-effects or Adverse reactions: None reported Monitoring: [place] PMP: Online review of the past 10-month period conducted. Compliant with practice rules and regulations List of all UDS test(s) done:  Lab Results  Component Value Date   SUMMARY FINAL 01/21/2016   Last UDS on record: Summary  Date Value Ref Range Status  01/21/2016 FINAL  Final     Comment:    ====================================================================  [facility] COMP DRUG ANALYSIS,UR ==================================================================== Test                             Result       Flag       Units Drug Present and Declared for Prescription Verification   Gabapentin                     PRESENT      EXPECTED   Trazodone                      PRESENT      EXPECTED   1,3 chlorophenyl piperazine    PRESENT      EXPECTED    1,3-chlorophenyl piperazine is an expected metabolite of    trazodone. Drug Present not Declared for Prescription Verification   Acetaminophen                  PRESENT      UNEXPECTED   Naproxen                       PRESENT      UNEXPECTED   Diphenhydramine                PRESENT      UNEXPECTED Drug Absent but Declared for Prescription Verification   Salicylate                     Not Detected UNEXPECTED    Aspirin, as indicated in the declared medication list, is not    always detected even when used as directed. ==================================================================== Test                      Result    Flag   Units      Ref Range   Creatinine              66               mg/dL      >=20 ==================================================================== Declared Medications:  The flagging and interpretation on this report are based on the  following declared medications.  Unexpected results may arise from  inaccuracies in the declared medications.  **Note: The testing scope of this panel includes these medications:  Gabapentin (Neurontin)  Trazodone  **Note: The testing scope of this panel does not include small to  moderate amounts of these reported medications:  Aspirin (Aspirin 81)  **Note: The testing scope of this panel does not include following  reported medications:  Albuterol  Amlodipine (Norvasc)  Fluticasone (Breo)  Hydrochlorothiazide (Microzide)  Lansoprazole (Prevacid)   Metformin  Olopatadine  Pravastatin  Vilanterol (Breo) ==================================================================== For clinical consultation, please call [phone]. ====================================================================    UDS interpretation: Compliant          Medication Assessment Form: Reviewed. Patient indicates being compliant with therapy Treatment compliance: Not applicable yet Risk Assessment Profile: Aberrant Behavior: None observed today Substance Use Disorder (SUD) Risk Level: Low Risk of opioid abuse or dependence: 0.7-3.0% with doses ? 36 MME/day and 6.1-26% with doses ? 120 MME/day. Opioid Risk Tool (ORT) Score: 4  ORT Score Interpretation Table:  Score <3 = Low Risk for SUD  Score between 4-7 = Moderate Risk for SUD  Score >8 = High Risk for Opioid Abuse   Depression Scale Score: PHQ-2: 0 No depression (0) PHQ-9:  0 No depression (0-4)  Pharmacologic Plan: Today we may be taking over the patient's pharmacological regimen. See below  Laboratory Chemistry  Inflammation Markers Lab Results  Component Value Date   ESRSEDRATE 7 01/21/2016   CRP 1.1 (H) 01/21/2016   Renal Function Lab Results  Component Value Date   BUN 12 03/28/2016   CREATININE 0.96 03/28/2016   GFRAA 71 03/28/2016   GFRNONAA 61 03/28/2016   Hepatic Function Lab Results  Component Value Date   AST 15 03/28/2016   ALT 13 03/28/2016   ALBUMIN 4.1 03/28/2016   Electrolytes Lab Results  Component Value Date   NA 141 03/28/2016   K 3.8 03/28/2016   CL 98 03/28/2016   CALCIUM 9.5 03/28/2016   MG 2.0 01/21/2016   Pain Modulating Vitamins Lab Results  Component Value Date   25OHVITD1 39 01/21/2016   25OHVITD2 6.4 01/21/2016   25OHVITD3 33 01/21/2016   VITAMINB12 436 01/21/2016   Coagulation Parameters Lab Results  Component Value Date   INR 1.0 01/31/2013   LABPROT 13.2 01/31/2013   APTT 27.8 01/31/2013   PLT 291 03/28/2016   Cardiovascular Lab  Results  Component Value Date   HGB 12.9 10/11/2013   HCT 41.8 03/28/2016   Note: Lab results reviewed and explained to patient in Layman's terms.  Recent Diagnostic Imaging Review  US Breast Ltd Uni Right Inc Axilla  Result Date: 03/21/2016 : CLINICAL DATA: Patient returns for six-month follow-up after stereotactic guided core biopsy of right breast calcifications in the upper-outer quadrant revealed fibroadenomatoid changes and microcalcifications. EXAM: 2D DIGITAL DIAGNOSTIC RIGHT MAMMOGRAM WITH CAD AND ADJUNCT TOMO ULTRASOUND RIGHT BREAST COMPARISON: Multiple prior studies including most recent 07/23/2015 ACR Breast Density: ACR Breast Density Category b: There are scattered areas of fibroglandular density. FINDINGS: Within the upper-outer quadrant of the right breast there is a tissue marker clip from prior stereotactic guided core biopsy. Few residual calcifications are identified. Within the lower inner quadrant of the right breast, there has been development of a partially obscured oval mass measuring approximately 5 mm in diameter. This is further evaluated sonographically. Mammographic images were processed with CAD. On physical exam, I palpate no abnormality in the lower inner quadrant of the right breast. Targeted ultrasound is performed, showing normal appearing fibrofatty tissue without suspicious mass or distortion. Evaluation of the right axilla is negative for adenopathy. IMPRESSION: 1. Post biopsy changes in the upper-outer quadrant of the right breast following benign biopsy. No further specific follow-up is felt to be necessary for this abnormality. 2. Interval development of a small mass in the lower inner quadrant of the right breast for which there is no sonographic correlate. Tissue diagnosis is recommended given the interval change. RECOMMENDATION: Stereotactic guided core biopsy is recommended for mass in the lower inner quadrant of the right breast. I have discussed the findings  and recommendations with the patient. Results were also provided in writing at the conclusion of the visit. If applicable, a reminder letter will be sent to the patient regarding the next appointment. BI-RADS CATEGORY: 4: Suspicious. Electronically Signed   By: Nolon Nations M.D.   On: 03/21/2016 11:30   Mm Diag Breast Tomo Uni Right  Result Date: 03/21/2016 CLINICAL DATA:  Patient returns for six-month follow-up after stereotactic guided core biopsy of right breast calcifications in the upper-outer quadrant revealed fibroadenomatoid changes and microcalcifications. EXAM: 2D DIGITAL DIAGNOSTIC RIGHT MAMMOGRAM WITH CAD AND ADJUNCT TOMO ULTRASOUND RIGHT BREAST COMPARISON:  Multiple prior studies including most recent 07/23/2015  ACR Breast Density Category b: There are scattered areas of fibroglandular density. FINDINGS: Within the upper-outer quadrant of the right breast there is a tissue marker clip from prior stereotactic guided core biopsy. Few residual calcifications are identified. Within the lower inner quadrant of the right breast, there has been development of a partially obscured oval mass measuring approximately 5 mm in diameter. This is further evaluated sonographically. Mammographic images were processed with CAD. On physical exam, I palpate no abnormality in the lower inner quadrant of the right breast. Targeted ultrasound is performed, showing normal appearing fibrofatty tissue without suspicious mass or distortion. Evaluation of the right axilla is negative for adenopathy. IMPRESSION: 1. Post biopsy changes in the upper-outer quadrant of the right breast following benign biopsy. No further specific follow-up is felt to be necessary for this abnormality. 2. Interval development of a small mass in the lower inner quadrant of the right breast for which there is no sonographic correlate. Tissue diagnosis is recommended given the interval change. RECOMMENDATION: Stereotactic guided core biopsy is  recommended for mass in the lower inner quadrant of the right breast. I have discussed the findings and recommendations with the patient. Results were also provided in writing at the conclusion of the visit. If applicable, a reminder letter will be sent to the patient regarding the next appointment. BI-RADS CATEGORY  4: Suspicious. Electronically Signed   By: Nolon Nations M.D.   On: 03/21/2016 10:04   Cervical Imaging: Cervical DG complete:  Results for orders placed during the hospital encounter of 01/21/16  DG Cervical Spine Complete   Narrative CLINICAL DATA:  Pain.  EXAM: CERVICAL SPINE - COMPLETE 4+ VIEW  COMPARISON:  CT 04/16/2002.  CT 01/01/2008.  FINDINGS: Multilevel degenerative change cervical spine. Disc space loss with endplate osteophyte formation noted at C4-C5, C5-C6, C6-C7. Loss of normal cervical lordosis. No evidence of fracture or dislocation. Pulmonary apices are clear.  IMPRESSION: Diffuse degenerative change cervical spine with loss of normal cervical lordosis.   Electronically Signed   By: Marcello Moores  Register   On: 01/21/2016 13:21    Shoulder Imaging: Gaston Islam DG:  Results for orders placed in visit on 01/01/08  DG Shoulder Right   Narrative * PRIOR REPORT IMPORTED FROM AN EXTERNAL SYSTEM *   PRIOR REPORT IMPORTED FROM THE [facility] WORKFLOW SYSTEM   REASON FOR EXAM:    fall pain  COMMENTS:   PROCEDURE:     DXR - DXR SHOULDER RIGHT COMPLETE  - Jan 01 2008  1:57PM   RESULT:     Three views of the RIGHT shoulder reveal the bones to be  osteopenic. I do not see evidence of an acute fracture.   IMPRESSION:   I see no acute bony abnormality of the shoulder. If there are strong  clinical concerns of an occult humeral head fracture, further evaluation  with CT scanning would be useful. I do not see objective evidence of such  a  fracture on these three plain films.   Thank you for the opportunity to contribute to the care of your patient.        Knee Imaging: Knee-R MR wo contrast:  Results for orders placed in visit on 03/09/08  MR Knee Right Wo Contrast   Narrative * PRIOR REPORT IMPORTED FROM AN EXTERNAL SYSTEM *   PRIOR REPORT IMPORTED FROM THE [facility] WORKFLOW SYSTEM   REASON FOR EXAM:    right knee pain  COMMENTS:   PROCEDURE:     MR  - MR  KNEE RT  WO  CONTRAST  - Mar 09 2008  3:10PM   RESULT:     Multiplanar/multisequence imaging of the knee was obtained  without the administration of gadolinium.   There is normal proportion of hemopoietic bone marrow fat for the  patient's  age. No evidence of abnormal marrow signal is appreciated to suggest the  sequela of contusion or occult fracture. There is no linear signal  abnormality to suggest the presence of an acute fracture.  Mild  degenerative  subchondral cyst formation is appreciated within the lateral femoral  trochlea. Moderate degenerative osteophytosis is appreciated.   A moderate joint effusion is present. There is a small noninflammatory  Baker's cyst within the medial popliteal fossa. There is mild anterior  subcutaneous edema.  Anterior cruciate ligament and posterior cruciate  ligaments are unremarkable.  Medial collateral ligament and lateral  collateral ligamentous complexes as well as quadriceps tendon and patellar  ligaments are intact. There is degenerative deformity, volume loss and  intrasubstance signal abnormality throughout both menisci. There is no  meniscal linear signal abnormality to suggest tearing. There is near  complete degenerative loss of the medial patellar articular cartilage.  There is moderate diffuse thinning of the lateral joint compartment  articular cartilage. There is diffuse thinning of the medial joint  compartment articular cartilage. There are non-rounded free bodies in the  lateral suprapatellar bursa with the largest measuring 9 mm.  There is a  single 7 mm free body in the anterior intercondylar region.    IMPRESSION:   1.     Articular cartilage degeneration most pronounced in the lateral  patellar region.  2.     Numerous free intraarticular osteochondral bodies.  3.     Degenerative changes of both menisci with no evidence of a meniscal  tear.  4.     Moderate nonspecific joint effusion.   Thank you for the opportunity to contribute to the care of your patient.     Knee-R DG 1-2 views:  Results for orders placed in visit on 02/16/13  DG Knee 1-2 Views Right   Narrative * PRIOR REPORT IMPORTED FROM AN EXTERNAL SYSTEM *   PRIOR REPORT IMPORTED FROM THE [facility] WORKFLOW SYSTEM   REASON FOR EXAM:    postop  COMMENTS:   Bedside (portable):Y   PROCEDURE:     DXR - DXR KNEE RIGHT AP AND LATERAL  - Feb 16 2013  3:02PM   RESULT:     History: Post-op TKR   Findings:   AP and lateral views of the right knee demonstrates a total right knee  arthroplasty without evidence of hardware failure complication. There is  no  significant joint effusion. There is no fracture or dislocation. The  alignment is anatomic.   IMPRESSION:   Right total knee arthroplasty.   Dictation Site: 1       Note: Imaging results reviewed and explained to patient in Layman's terms.  Meds  The patient has a current medication list which includes the following prescription(s): amlodipine, aspirin ec, breo ellipta, brompheniramine-pseudoephedrine-dm, gabapentin, hydrochlorothiazide, lansoprazole, metformin, NONFORMULARY OR COMPOUNDED ITEM, olopatadine hcl, and trazodone.  Current Outpatient Prescriptions on File Prior to Visit  Medication Sig  . amLODipine (NORVASC) 5 MG tablet Take 1 tablet (5 mg total) by mouth daily.  Marland Kitchen aspirin EC 81 MG tablet Take 81 mg by mouth daily.   Marland Kitchen BREO ELLIPTA 200-25 MCG/INH AEPB inhale 1 dose once daily  . brompheniramine-pseudoephedrine-DM 30-2-10 MG/5ML syrup Take  5 mLs by mouth 4 (four) times daily as needed.  . gabapentin (NEURONTIN) 600 MG tablet Take 6 tablets by  mouth daily (Patient taking differently: 3 (three) times daily. Take 6 tablets by mouth daily)  . hydrochlorothiazide (HYDRODIURIL) 25 MG tablet Take 1 tablet (25 mg total) by mouth daily.  . lansoprazole (PREVACID) 30 MG capsule Take 30 mg by mouth daily at 12 noon.  . metFORMIN (GLUCOPHAGE) 500 MG tablet Take 1 tablet (500 mg total) by mouth daily with breakfast.  . Olopatadine HCl 0.7 % SOLN Apply 1 drop to eye daily.  . traZODone (DESYREL) 50 MG tablet Take 0.5-1 tablets (25-50 mg total) by mouth at bedtime as needed for sleep.   No current facility-administered medications on file prior to visit.    ROS  Constitutional: Denies any fever or chills Gastrointestinal: No reported hemesis, hematochezia, vomiting, or acute GI distress Musculoskeletal: Denies any acute onset joint swelling, redness, loss of ROM, or weakness Neurological: No reported episodes of acute onset apraxia, aphasia, dysarthria, agnosia, amnesia, paralysis, loss of coordination, or loss of consciousness  Allergies  [name] is allergic to lisinopril; lovastatin; and shellfish allergy.  PFSH  Drug: [name]  reports that she does not use drugs. Alcohol:  reports that she does not drink alcohol. Tobacco:  reports that she has never smoked. She has never used smokeless tobacco. Medical:  has a past medical history of Asthma; Diabetes mellitus without complication ([place]); Hyperlipidemia; and Hypertension. Family: family history includes Anuerysm in her mother; CAD in her father; Dementia in her father and mother; Diabetes in her brother and father; Heart failure in her father; Lupus in her sister; Throat cancer in her maternal grandfather.  Past Surgical History:  Procedure Laterality Date  . ABDOMINAL HYSTERECTOMY    . BREAST BIOPSY     04/08/16  . TOTAL KNEE ARTHROPLASTY Right 2014  . TOTAL KNEE ARTHROPLASTY Right 02/16/2013   Constitutional Exam  General appearance: Well nourished, well developed, and well  hydrated. In no apparent acute distress Vitals:   04/02/16 0910  BP: (!) 142/80  Pulse: 84  Resp: 16  Temp: 98 F (36.7 C)  SpO2: 95%  Weight: 220 lb (99.8 kg)  Height: 5\' 3"  (1.6 m)   BMI Assessment: Estimated body mass index is 38.97 kg/m as calculated from the following:   Height as of this encounter: 5\' 3"  (1.6 m).   Weight as of this encounter: 220 lb (99.8 kg).  BMI interpretation table: BMI level Category Range association with higher incidence of chronic pain  <18 kg/m2 Underweight   18.5-24.9 kg/m2 Ideal body weight   25-29.9 kg/m2 Overweight Increased incidence by 20%  30-34.9 kg/m2 Obese (Class I) Increased incidence by 68%  35-39.9 kg/m2 Severe obesity (Class II) Increased incidence by 136%  >40 kg/m2 Extreme obesity (Class III) Increased incidence by 254%   BMI Readings from Last 4 Encounters:  04/02/16 38.97 kg/m  03/28/16 40.18 kg/m  02/29/16 40.28 kg/m  01/21/16 38.97 kg/m   Wt Readings from Last 4 Encounters:  04/02/16 220 lb (99.8 kg)  03/28/16 226 lb 12.8 oz (102.9 kg)  02/29/16 227 lb 6.4 oz (103.1 kg)  01/21/16 220 lb (99.8 kg)  Psych/Mental status: Alert, oriented x 3 (person, place, & time) Eyes: PERLA Respiratory: No evidence of acute respiratory distress  Cervical Spine Exam  Inspection: No masses, redness, or swelling Alignment: Symmetrical Functional ROM: Decreased ROM Stability: No instability detected Muscle strength & Tone: Functionally intact Sensory: Unimpaired Palpation:  Non-contributory  Upper Extremity (UE) Exam    Side: Right upper extremity  Side: Left upper extremity  Inspection: No masses, redness, swelling, or asymmetry  Inspection: No masses, redness, swelling, or asymmetry  Functional ROM: Unrestricted ROM         Functional ROM: Unrestricted ROM          Muscle strength & Tone: Functionally intact  Muscle strength & Tone: Functionally intact  Sensory: Unimpaired  Sensory: Unimpaired  Palpation: Non-contributory   Palpation: Non-contributory   Thoracic Spine Exam  Inspection: No masses, redness, or swelling Alignment: Symmetrical Functional ROM: Unrestricted ROM Stability: No instability detected Sensory: Unimpaired Muscle strength & Tone: Functionally intact Palpation: Non-contributory  Lumbar Spine Exam  Inspection: No masses, redness, or swelling Alignment: Symmetrical Functional ROM: Decreased ROM Stability: No instability detected Muscle strength & Tone: Functionally intact Sensory: Unimpaired Palpation: Non-contributory Provocative Tests: Lumbar Hyperextension and rotation test: evaluation deferred today       Patrick's Maneuver: evaluation deferred today              Gait & Posture Assessment  Ambulation: Unassisted Gait: Relatively normal for age and body habitus Posture: WNL   Lower Extremity Exam    Side: Right lower extremity  Side: Left lower extremity  Inspection: No masses, redness, swelling, or asymmetry  Inspection: No masses, redness, swelling, or asymmetry  Functional ROM: Unrestricted ROM          Functional ROM: Unrestricted ROM          Muscle strength & Tone: Functionally intact  Muscle strength & Tone: Functionally intact  Sensory: Unimpaired  Sensory: Unimpaired  Palpation: Non-contributory  Palpation: Non-contributory   Assessment & Plan  Primary Diagnosis & Pertinent Problem List: The primary encounter diagnosis was Atypical facial pain (Right). Diagnoses of Neuropathic postherpetic trigeminal neuralgia (Right) (V1) and Neuropathic pain were also pertinent to this visit.  Visit Diagnosis: 1. Atypical facial pain (Right)   2. Neuropathic postherpetic trigeminal neuralgia (Right) (V1)   3. Neuropathic pain    Problems updated and reviewed during this visit: No problems updated. Problem-specific Plan(s): No problem-specific Assessment & Plan notes found for this encounter.  No new Assessment & Plan notes have been filed under this hospital service since  the last note was generated. Service: Pain Management  Plan of Care   Problem List Items Addressed This Visit      High   Atypical facial pain (Right) - Primary (Chronic)   Relevant Medications   NONFORMULARY OR COMPOUNDED ITEM   Neuropathic pain (Chronic)   Relevant Medications   NONFORMULARY OR COMPOUNDED ITEM   Neuropathic postherpetic trigeminal neuralgia (Right) (V1) (Chronic)   Relevant Medications   NONFORMULARY OR COMPOUNDED ITEM    Other Visit Diagnoses   None.    Pharmacotherapy (Medications Ordered): Meds ordered this encounter  Medications  . NONFORMULARY OR COMPOUNDED ITEM    Sig: Compounded cream: 6% Gabapentin, 2.5% Lidocaine, 0.5% Meloxicam, 5% Methocarbamol, 2.5% Prilocaine. Sig: Apply 1-2 gm(s) (2-4 pumps) to affected area, 3-4 times/day. (1 pump = 0.5 gm) Dispense: 120 gm Pump Bottle. Dispenser: 1 pump = 0.5 gm.    Dispense:  1 each    Refill:  0    Do not place this medication, or any other prescription from our practice, on "Automatic Refill". Patient may have prescription filled one day early if pharmacy is closed on scheduled refill date.   New Prescriptions   NONFORMULARY OR COMPOUNDED ITEM    Compounded cream: 6% Gabapentin, 2.5%  Lidocaine, 0.5% Meloxicam, 5% Methocarbamol, 2.5% Prilocaine. Sig: Apply 1-2 gm(s) (2-4 pumps) to affected area, 3-4 times/day. (1 pump = 0.5 gm) Dispense: 120 gm Pump Bottle. Dispenser: 1 pump = 0.5 gm.   Medications administered during this visit: [name] had no medications administered during this visit. Lab-work, Procedure(s), & Referral(s) Ordered: No orders of the defined types were placed in this encounter.  Imaging & Referral(s) Ordered: None  Interventional Therapies: Pending/Scheduled/Planned:    None at this time.    Considering:   None at this time.    PRN Procedures:   None at this time.    Requested PM Follow-up: Return in about 1 month (around 05/03/2016) for Med-Mgmt.  Future  Appointments Date Time Provider [place]  04/08/2016 8:20 AM [facility]  04/08/2016 9:20 AM [facility]  04/22/2016 8:45 AM Milinda Pointer, MD [facility] None    Primary Care Physician: Vernie Murders, PA Location: [facility] Facility Note by: Kathlen Brunswick. Dossie Arbour, M.D, DABA, DABAPM, DABPM, DABIPP, FIPP  Pain Score Disclaimer: We use the NRS-11 scale. This is a self-reported, subjective measurement of pain severity with only modest accuracy. It is used primarily to identify changes within a particular patient. It must be understood that outpatient pain scales are significantly less accurate that those used for research, where they can be applied under ideal controlled circumstances with minimal exposure to variables. In reality, the score is likely to be a combination of pain intensity and pain affect, where pain affect describes the degree of emotional arousal or changes in action readiness caused by the sensory experience of pain. Factors such as social and work situation, setting, emotional state, anxiety levels, expectation, and prior pain experience may influence pain perception and show large inter-individual differences that may also be affected by time variables.  Patient instructions provided during this appointment: Patient Instructions  You were given one prescripton for a cream to be filled at [facility].

## 2016-04-02 NOTE — Progress Notes (Signed)
Safety precautions to be maintained throughout the outpatient stay will include: orient to surroundings, keep bed in low position, maintain call bell within reach at all times, provide assistance with transfer out of bed and ambulation.  

## 2016-04-02 NOTE — Patient Instructions (Signed)
You were given one prescripton for a cream to be filled at [facility].

## 2016-04-08 ENCOUNTER — Ambulatory Visit
Admission: RE | Admit: 2016-04-08 | Discharge: 2016-04-08 | Disposition: A | Discharge status: Home / Self Care | Payer: Medicare Other | Source: Ambulatory Visit | Attending: Family Medicine | Admitting: Family Medicine

## 2016-04-08 DIAGNOSIS — D241 Benign neoplasm of right breast: Secondary | ICD-10-CM | POA: Diagnosis not present

## 2016-04-08 DIAGNOSIS — N6310 Unspecified lump in the right breast, unspecified quadrant: Secondary | ICD-10-CM | POA: Diagnosis not present

## 2016-04-08 DIAGNOSIS — R928 Other abnormal and inconclusive findings on diagnostic imaging of breast: Secondary | ICD-10-CM

## 2016-04-08 DIAGNOSIS — N6314 Unspecified lump in the right breast, lower inner quadrant: Secondary | ICD-10-CM | POA: Diagnosis not present

## 2016-04-08 DIAGNOSIS — N6091 Unspecified benign mammary dysplasia of right breast: Secondary | ICD-10-CM | POA: Diagnosis not present

## 2016-04-08 HISTORY — PX: BREAST BIOPSY: SHX20

## 2016-04-09 LAB — SURGICAL PATHOLOGY

## 2016-04-14 ENCOUNTER — Other Ambulatory Visit: Payer: Self-pay

## 2016-04-14 ENCOUNTER — Telehealth: Payer: Self-pay | Admitting: Family Medicine

## 2016-04-14 ENCOUNTER — Telehealth: Payer: Self-pay | Admitting: *Deleted

## 2016-04-14 NOTE — Telephone Encounter (Signed)
Talked to patient to assist with coordination of care.  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  Scheduled appointment to see Dr. Adonis Huguenin on 04/14/16 @ 2:15.  Will notify Vernie Murders, PA of the appointment.

## 2016-04-14 NOTE — Telephone Encounter (Signed)
Please Review,  Thanks,  -Sharicka Pogorzelski

## 2016-04-14 NOTE — Telephone Encounter (Signed)
Thornell Mule with [facility] called to advise pt has an appointment with Dr Adonis Huguenin 04/15/16@2 :15 per an abnormal path report/MW

## 2016-04-15 ENCOUNTER — Ambulatory Visit: Payer: Medicare Other | Admitting: General Surgery

## 2016-04-16 ENCOUNTER — Ambulatory Visit (INDEPENDENT_AMBULATORY_CARE_PROVIDER_SITE_OTHER): Payer: Medicare Other | Admitting: General Surgery

## 2016-04-16 ENCOUNTER — Ambulatory Visit: Payer: Medicare Other | Admitting: General Surgery

## 2016-04-16 ENCOUNTER — Encounter: Payer: Self-pay | Admitting: General Surgery

## 2016-04-16 VITALS — BP 140/70 | HR 90 | Temp 98.2°F | Ht 63.0 in | Wt 226.0 lb

## 2016-04-16 DIAGNOSIS — N6314 Unspecified lump in the right breast, lower inner quadrant: Secondary | ICD-10-CM | POA: Diagnosis not present

## 2016-04-16 DIAGNOSIS — N6310 Unspecified lump in the right breast, unspecified quadrant: Secondary | ICD-10-CM | POA: Insufficient documentation

## 2016-04-16 NOTE — Progress Notes (Signed)
Patient ID: [name], female   DOB: 10-28-1950, 65 y.o.   MRN: [id number]  CC: RIGHT BREAST MASS  HPI [name] is a 65 y.o. female who presents to clinic today for evaluation of a right breast mass. Patient reports that over the last year she is been having serial imaging exams performed due to an abnormal finding on her right breast. The first was in the upper outer quadrant was biopsied and found to be benign. Then last month she had a follow-up imaging which showed a new area of concern in the lower inner quadrant of the right breast and was biopsied and returned with atypical cells. Patient has been performing self exams but has never felt any abnormality on her self. She has no personal history of cancer or any known family history of breast cancer. She has used some hormone replacement therapy in the past, she had menarche at 10, she has had 2 pregnancies with 2 live births, she never breast-fed, she never smoked. Patient denies any current symptomatology at all, she denies any fevers, chills, nausea, vomiting, chest pain, short of breath, diarrhea, constipation. She states she's been told she is prediabetic and has been prescribed metformin in the past but is not currently taking it.  HPI  Past Medical History:  Diagnosis Date  . Asthma   . Diabetes mellitus without complication ([place])   . Hyperlipidemia   . Hypertension     Past Surgical History:  Procedure Laterality Date  . ABDOMINAL HYSTERECTOMY    . BREAST BIOPSY Right 2016   benign  . BREAST BIOPSY Right 04/08/2016   path pending  . TOTAL KNEE ARTHROPLASTY Right 2014  . TOTAL KNEE ARTHROPLASTY Right 02/16/2013    Family History  Problem Relation Age of Onset  . Anuerysm Mother     Brain  . Dementia Mother   . Heart failure Father   . CAD Father   . Dementia Father   . Diabetes Father   . Lupus Sister   . Diabetes Brother   . Throat cancer Maternal Grandfather     Social History Social History  Substance  Use Topics  . Smoking status: Never Smoker  . Smokeless tobacco: Never Used  . Alcohol use No    Allergies  Allergen Reactions  . Lisinopril Cough  . Lovastatin Other (See Comments)    Pain  . Shellfish Allergy Rash    Current Outpatient Prescriptions  Medication Sig Dispense Refill  . amLODipine (NORVASC) 5 MG tablet Take 1 tablet (5 mg total) by mouth daily. 90 tablet 3  . BREO ELLIPTA 200-25 MCG/INH AEPB inhale 1 dose once daily 90 each 3  . gabapentin (NEURONTIN) 600 MG tablet Take 6 tablets by mouth daily (Patient taking differently: 3 (three) times daily. Take 6 tablets by mouth daily) 540 tablet 1  . glucosamine-chondroitin 500-400 MG tablet Take 1 tablet by mouth 3 (three) times daily.    . hydrochlorothiazide (HYDRODIURIL) 25 MG tablet Take 1 tablet (25 mg total) by mouth daily. 90 tablet 3  . NONFORMULARY OR COMPOUNDED ITEM Compounded cream: 6% Gabapentin, 2.5% Lidocaine, 0.5% Meloxicam, 5% Methocarbamol, 2.5% Prilocaine. Sig: Apply 1-2 gm(s) (2-4 pumps) to affected area, 3-4 times/day. (1 pump = 0.5 gm) Dispense: 120 gm Pump Bottle. Dispenser: 1 pump = 0.5 gm. 1 each 0  . Olopatadine HCl 0.7 % SOLN Apply 1 drop to eye daily. 2.5 mL 0  . traZODone (DESYREL) 50 MG tablet Take 0.5-1 tablets (25-50 mg total)  by mouth at bedtime as needed for sleep. 30 tablet 3   No current facility-administered medications for this visit.      Review of Systems A Multi-point review of systems was asked and was negative except for the findings documented in the history of present illness  Physical Exam Blood pressure 140/70, pulse 90, temperature 98.2 F (36.8 C), temperature source Oral, height 5\' 3"  (1.6 m), weight 102.5 kg (226 lb). CONSTITUTIONAL: No acute distress. EYES: Pupils are equal, round, and reactive to light, Sclera are non-icteric. EARS, NOSE, MOUTH AND THROAT: The oropharynx is clear. The oral mucosa is pink and moist. Hearing is intact to voice. LYMPH NODES:  Lymph  nodes in the neck and bilateral axilla are normal. BREAST: Bilateral breast exam performed. Previous biopsy site is easily identified to the right breast. There are no dominant masses or concerning lesions in either breast. There is an area of increased density to the patient's left breast but without any concerning findings on exam. RESPIRATORY:  Lungs are clear. There is normal respiratory effort, with equal breath sounds bilaterally, and without pathologic use of accessory muscles. CARDIOVASCULAR: Heart is regular without murmurs, gallops, or rubs. GI: The abdomen is soft, nontender, and nondistended. There are no palpable masses. There is no hepatosplenomegaly. There are normal bowel sounds in all quadrants. GU: Rectal deferred.   MUSCULOSKELETAL: Normal muscle strength and tone. No cyanosis or edema.   SKIN: Turgor is good and there are no pathologic skin lesions or ulcers. NEUROLOGIC: Motor and sensation is grossly normal. Cranial nerves are grossly intact. PSYCH:  Oriented to person, place and time. Affect is normal.  Data Reviewed Images and labs reviewed. All of her previous labs performed last month are within normal limits. Imaging to the patient's breast shows a 5 mm area of density seen on mammogram and ultrasound. It is oval in nature without any overt concerning findings other than having not been there 6 months prior. The pathology report returned an atypical papillary neoplasm. Pathologist documented this could be ADH versus low-grade DCIS. I have personally reviewed the patient's imaging, laboratory findings and medical records.    Assessment    Right breast mass    Plan    65 year old female with a new right breast mass with atypical cells. Discussed at length the diagnosis with the patient as well as the treatment regimens for both atypical ductal hyperplasia as well as DCIS. Discussed that the next step in her treatment is to excise the new area so that we can better  delineate what her pathology actually is. It is most likely that an excision is all that is needed if this turns out to be ADH. The procedure of a wire localized lumpectomy was described in detail to include the risks, benefits, alternatives. Patient voiced understanding and desired to proceed. She does have a social issue of being the primary caregiver for her grandchildren but is able to accommodate procedures on Friday. We will plan to proceed to the operating room on Friday, November 17.     Time spent with the patient was 45 minutes, with more than 50% of the time spent in face-to-face education, counseling and care coordination.     Clayburn Pert, MD FACS General Surgeon 04/16/2016, 12:06 PM

## 2016-04-16 NOTE — Patient Instructions (Addendum)
We have your surgery scheduled for 05/02/16 with Dr.Woodham at [facility]. Please see blue pre-care sheet for surgery information. Please call our office if you have any questions or concerns.

## 2016-04-18 ENCOUNTER — Other Ambulatory Visit: Payer: Self-pay

## 2016-04-18 ENCOUNTER — Other Ambulatory Visit: Payer: Self-pay | Admitting: General Surgery

## 2016-04-18 DIAGNOSIS — C50911 Malignant neoplasm of unspecified site of right female breast: Secondary | ICD-10-CM

## 2016-04-18 DIAGNOSIS — R928 Other abnormal and inconclusive findings on diagnostic imaging of breast: Secondary | ICD-10-CM | POA: Diagnosis not present

## 2016-04-21 ENCOUNTER — Telehealth: Payer: Self-pay | Admitting: General Surgery

## 2016-04-21 NOTE — Telephone Encounter (Signed)
Pt advised of pre op date/time and sx date. Sx: 05/02/16 with Dr Sherlon Handing lumpectomy with needle localization.  Pre op: 04/24/16 @ 9:30am--Office.   Patient made aware to arrive at [facility] at 11:05am the day of surgery.

## 2016-04-22 ENCOUNTER — Encounter: Payer: Medicare Other | Admitting: Pain Medicine

## 2016-04-24 ENCOUNTER — Encounter
Admission: RE | Admit: 2016-04-24 | Discharge: 2016-04-24 | Disposition: A | Discharge status: Home / Self Care | Payer: Medicare Other | Source: Ambulatory Visit | Attending: General Surgery | Admitting: General Surgery

## 2016-04-24 ENCOUNTER — Ambulatory Visit
Admission: RE | Admit: 2016-04-24 | Discharge: 2016-04-24 | Disposition: A | Discharge status: Home / Self Care | Payer: Medicare Other | Source: Ambulatory Visit | Attending: General Surgery | Admitting: General Surgery

## 2016-04-24 DIAGNOSIS — Z01812 Encounter for preprocedural laboratory examination: Secondary | ICD-10-CM | POA: Diagnosis not present

## 2016-04-24 DIAGNOSIS — N6310 Unspecified lump in the right breast, unspecified quadrant: Secondary | ICD-10-CM | POA: Insufficient documentation

## 2016-04-24 DIAGNOSIS — Z01810 Encounter for preprocedural cardiovascular examination: Secondary | ICD-10-CM | POA: Insufficient documentation

## 2016-04-24 DIAGNOSIS — Z01818 Encounter for other preprocedural examination: Secondary | ICD-10-CM | POA: Diagnosis not present

## 2016-04-24 HISTORY — DX: Neuralgia and neuritis, unspecified: M79.2

## 2016-04-24 HISTORY — DX: Gastro-esophageal reflux disease without esophagitis: K21.9

## 2016-04-24 HISTORY — DX: Zoster without complications: B02.9

## 2016-04-24 HISTORY — DX: Unspecified osteoarthritis, unspecified site: M19.90

## 2016-04-24 LAB — BASIC METABOLIC PANEL
ANION GAP: 7 (ref 5–15)
BUN: 12 mg/dL (ref 6–20)
CALCIUM: 9.1 mg/dL (ref 8.9–10.3)
CO2: 31 mmol/L (ref 22–32)
Chloride: 102 mmol/L (ref 101–111)
Creatinine, Ser: 1.08 mg/dL — ABNORMAL HIGH (ref 0.44–1.00)
GFR calc non Af Amer: 52 mL/min — ABNORMAL LOW (ref >60)
Glucose, Bld: 127 mg/dL — ABNORMAL HIGH (ref 65–99)
POTASSIUM: 3 mmol/L — AB (ref 3.5–5.1)
Sodium: 140 mmol/L (ref 135–145)

## 2016-04-24 MED ORDER — CHLORHEXIDINE GLUCONATE CLOTH 2 % EX PADS
6.0000 | MEDICATED_PAD | Freq: Once | CUTANEOUS | Status: DC
  Filled 2016-04-24: qty 6

## 2016-04-24 NOTE — Pre-Procedure Instructions (Signed)
Access patient on day of surgery for any active break out of shingles.

## 2016-04-24 NOTE — Patient Instructions (Signed)
  Your procedure is scheduled on: 05/02/16 Fri Report to [facility] @ 11:20 am  Remember: Instructions that are not followed completely may result in serious medical risk, up to and including death, or upon the discretion of your surgeon and anesthesiologist your surgery may need to be rescheduled.    _x___ 1. Do not eat food or drink liquids after midnight. No gum chewing or hard candies.     __x__ 2. No Alcohol for 24 hours before or after surgery.   __x__3. No Smoking for 24 prior to surgery.   ____  4. Bring all medications with you on the day of surgery if instructed.    __x__ 5. Notify your doctor if there is any change in your medical condition     (cold, fever, infections).     Do not wear jewelry, make-up, hairpins, clips or nail polish.  Do not wear lotions, powders, or perfumes. You may wear deodorant.  Do not shave 48 hours prior to surgery. Men may shave face and neck.  Do not bring valuables to the hospital.    [facility] is not responsible for any belongings or valuables.               Contacts, dentures or bridgework may not be worn into surgery.  Leave your suitcase in the car. After surgery it may be brought to your room.  For patients admitted to the hospital, discharge time is determined by your treatment team.   Patients discharged the day of surgery will not be allowed to drive home.    Please read over the following fact sheets that you were given:   [facility] Preparing for Surgery and or MRSA Information   _x___ Take these medicines the morning of surgery with A SIP OF WATER:    1. amLODipine (NORVASC) 5 MG tablet  2.BREO ELLIPTA 200-25 MCG/INH AEPB  3.gabapentin (NEURONTIN) 600 MG tablet  4.  5.  6.  ____Fleets enema or Magnesium Citrate as directed.   _x___ Use CHG Soap or sage wipes as directed on instruction sheet   ____ Use inhalers on the day of surgery and bring to hospital day of surgery  _x___ Stop metformin 2 days prior to  surgery    ____ Take 1/2 of usual insulin dose the night before surgery and none on the morning of           surgery.   _x___ Stop aspirin or coumadin, or plavix  Stop aspirin 1 week before surgery  x__ Stop Anti-inflammatories such as Advil, Aleve, Ibuprofen, Motrin, Naproxen,          Naprosyn, Goodies powders or aspirin products. Ok to take Tylenol.   ____ Stop supplements until after surgery.    ____ Bring C-Pap to the hospital.

## 2016-04-25 ENCOUNTER — Telehealth: Payer: Self-pay

## 2016-04-25 NOTE — Telephone Encounter (Signed)
Called patient and had to leave her a voicemail.  We received a note from Pre-Admit in reference to patient's potassium level being low (3.0). Therefore she will need to take potassium Chloride 40 meq one tablet twice a day for five days. This prescription was called in  to patient [place]. Patient will need to take this as soon as possible since she will have surgery on 05/02/2016.

## 2016-05-01 MED ORDER — CEFAZOLIN SODIUM-DEXTROSE 2-4 GM/100ML-% IV SOLN
2.0000 g | INTRAVENOUS | Status: AC
  Administered 2016-05-02: 2 g via INTRAVENOUS

## 2016-05-02 ENCOUNTER — Encounter
Admission: RE | Disposition: A | Discharge status: Home / Self Care | Payer: Self-pay | Source: Ambulatory Visit | Attending: General Surgery

## 2016-05-02 ENCOUNTER — Ambulatory Visit
Admission: RE | Admit: 2016-05-02 | Discharge: 2016-05-02 | Disposition: A | Discharge status: Home / Self Care | Payer: Medicare Other | Source: Ambulatory Visit | Attending: General Surgery | Admitting: General Surgery

## 2016-05-02 ENCOUNTER — Ambulatory Visit: Payer: Medicare Other | Admitting: Anesthesiology

## 2016-05-02 ENCOUNTER — Encounter: Payer: Self-pay | Admitting: *Deleted

## 2016-05-02 DIAGNOSIS — I10 Essential (primary) hypertension: Secondary | ICD-10-CM | POA: Insufficient documentation

## 2016-05-02 DIAGNOSIS — Z91013 Allergy to seafood: Secondary | ICD-10-CM | POA: Diagnosis not present

## 2016-05-02 DIAGNOSIS — K219 Gastro-esophageal reflux disease without esophagitis: Secondary | ICD-10-CM | POA: Diagnosis not present

## 2016-05-02 DIAGNOSIS — M1990 Unspecified osteoarthritis, unspecified site: Secondary | ICD-10-CM | POA: Insufficient documentation

## 2016-05-02 DIAGNOSIS — E119 Type 2 diabetes mellitus without complications: Secondary | ICD-10-CM | POA: Insufficient documentation

## 2016-05-02 DIAGNOSIS — E785 Hyperlipidemia, unspecified: Secondary | ICD-10-CM | POA: Diagnosis not present

## 2016-05-02 DIAGNOSIS — N6310 Unspecified lump in the right breast, unspecified quadrant: Secondary | ICD-10-CM

## 2016-05-02 DIAGNOSIS — Z888 Allergy status to other drugs, medicaments and biological substances status: Secondary | ICD-10-CM | POA: Insufficient documentation

## 2016-05-02 DIAGNOSIS — Z96651 Presence of right artificial knee joint: Secondary | ICD-10-CM | POA: Diagnosis not present

## 2016-05-02 DIAGNOSIS — Z7982 Long term (current) use of aspirin: Secondary | ICD-10-CM | POA: Diagnosis not present

## 2016-05-02 DIAGNOSIS — J45909 Unspecified asthma, uncomplicated: Secondary | ICD-10-CM | POA: Insufficient documentation

## 2016-05-02 DIAGNOSIS — Z7984 Long term (current) use of oral hypoglycemic drugs: Secondary | ICD-10-CM | POA: Insufficient documentation

## 2016-05-02 DIAGNOSIS — D4861 Neoplasm of uncertain behavior of right breast: Secondary | ICD-10-CM | POA: Diagnosis not present

## 2016-05-02 DIAGNOSIS — C50911 Malignant neoplasm of unspecified site of right female breast: Secondary | ICD-10-CM

## 2016-05-02 DIAGNOSIS — R928 Other abnormal and inconclusive findings on diagnostic imaging of breast: Secondary | ICD-10-CM | POA: Diagnosis not present

## 2016-05-02 HISTORY — PX: BREAST LUMPECTOMY WITH NEEDLE LOCALIZATION: SHX5759

## 2016-05-02 LAB — POCT I-STAT 4, (NA,K, GLUC, HGB,HCT)
Glucose, Bld: 98 mg/dL (ref 65–99)
HCT: 44 % (ref 36.0–46.0)
Hemoglobin: 15 g/dL (ref 12.0–15.0)
Potassium: 3.6 mmol/L (ref 3.5–5.1)
Sodium: 141 mmol/L (ref 135–145)

## 2016-05-02 LAB — GLUCOSE, CAPILLARY: GLUCOSE-CAPILLARY: 98 mg/dL (ref 65–99)

## 2016-05-02 SURGERY — BREAST LUMPECTOMY WITH NEEDLE LOCALIZATION
Anesthesia: General | Laterality: Right | Wound class: Clean Contaminated

## 2016-05-02 MED ORDER — ONDANSETRON HCL 4 MG/2ML IJ SOLN
INTRAMUSCULAR | Status: DC | PRN
  Administered 2016-05-02: 4 mg via INTRAVENOUS

## 2016-05-02 MED ORDER — ONDANSETRON HCL 4 MG/2ML IJ SOLN
4.0000 mg | Freq: Once | INTRAMUSCULAR | Status: DC | PRN
Start: 2016-05-02 — End: 2016-05-02

## 2016-05-02 MED ORDER — PHENYLEPHRINE HCL 10 MG/ML IJ SOLN
INTRAMUSCULAR | Status: DC | PRN
  Administered 2016-05-02 (×4): 100 ug via INTRAVENOUS

## 2016-05-02 MED ORDER — DOCUSATE SODIUM 100 MG PO CAPS: 100.0000 mg | ORAL_CAPSULE | Freq: Two times a day (BID) | ORAL | 0 refills | Status: DC

## 2016-05-02 MED ORDER — LIDOCAINE-EPINEPHRINE (PF) 1 %-1:200000 IJ SOLN
INTRAMUSCULAR | Status: DC | PRN
  Administered 2016-05-02: 7.5 mL

## 2016-05-02 MED ORDER — FAMOTIDINE 20 MG PO TABS
ORAL_TABLET | ORAL | Status: AC
  Administered 2016-05-02: 20 mg via ORAL
  Filled 2016-05-02: qty 1

## 2016-05-02 MED ORDER — HYDROCODONE-ACETAMINOPHEN 5-325 MG PO TABS: 1.0000 | ORAL_TABLET | Freq: Four times a day (QID) | ORAL | 0 refills | Status: DC | PRN

## 2016-05-02 MED ORDER — HYDROCODONE-ACETAMINOPHEN 5-325 MG PO TABS
ORAL_TABLET | ORAL | Status: AC
  Filled 2016-05-02: qty 1

## 2016-05-02 MED ORDER — BUPIVACAINE HCL (PF) 0.5 % IJ SOLN
INTRAMUSCULAR | Status: AC
  Filled 2016-05-02: qty 30

## 2016-05-02 MED ORDER — BUPIVACAINE HCL (PF) 0.5 % IJ SOLN
INTRAMUSCULAR | Status: DC | PRN
  Administered 2016-05-02: 7.5 mL

## 2016-05-02 MED ORDER — ONDANSETRON 4 MG PO TBDP: 4.0000 mg | ORAL_TABLET | Freq: Three times a day (TID) | ORAL | 0 refills | Status: DC | PRN

## 2016-05-02 MED ORDER — SODIUM CHLORIDE 0.9 % IV SOLN
INTRAVENOUS | Status: DC
  Administered 2016-05-02: 13:00:00 via INTRAVENOUS

## 2016-05-02 MED ORDER — PROPOFOL 10 MG/ML IV BOLUS
INTRAVENOUS | Status: DC | PRN
  Administered 2016-05-02: 160 mg via INTRAVENOUS

## 2016-05-02 MED ORDER — FAMOTIDINE 20 MG PO TABS
20.0000 mg | ORAL_TABLET | Freq: Once | ORAL | Status: AC
  Administered 2016-05-02: 20 mg via ORAL

## 2016-05-02 MED ORDER — CEFAZOLIN SODIUM-DEXTROSE 2-4 GM/100ML-% IV SOLN
INTRAVENOUS | Status: AC
  Administered 2016-05-02: 2 g via INTRAVENOUS
  Filled 2016-05-02: qty 100

## 2016-05-02 MED ORDER — DEXAMETHASONE SODIUM PHOSPHATE 10 MG/ML IJ SOLN
INTRAMUSCULAR | Status: DC | PRN
  Administered 2016-05-02: 5 mg via INTRAVENOUS

## 2016-05-02 MED ORDER — HYDROCODONE-ACETAMINOPHEN 5-325 MG PO TABS
1.0000 | ORAL_TABLET | Freq: Once | ORAL | Status: AC
  Administered 2016-05-02: 1 via ORAL

## 2016-05-02 MED ORDER — LIDOCAINE-EPINEPHRINE (PF) 1 %-1:200000 IJ SOLN
INTRAMUSCULAR | Status: AC
  Filled 2016-05-02: qty 30

## 2016-05-02 MED ORDER — FENTANYL CITRATE (PF) 100 MCG/2ML IJ SOLN: 25.0000 ug | INTRAMUSCULAR | Status: DC | PRN

## 2016-05-02 MED ORDER — KETOROLAC TROMETHAMINE 30 MG/ML IJ SOLN
INTRAMUSCULAR | Status: DC | PRN
  Administered 2016-05-02: 30 mg via INTRAVENOUS

## 2016-05-02 MED ORDER — FENTANYL CITRATE (PF) 100 MCG/2ML IJ SOLN
INTRAMUSCULAR | Status: DC | PRN
Start: 2016-05-02 — End: 2016-05-02
  Administered 2016-05-02 (×2): 25 ug via INTRAVENOUS

## 2016-05-02 MED ORDER — LIDOCAINE HCL (CARDIAC) 20 MG/ML IV SOLN
INTRAVENOUS | Status: DC | PRN
  Administered 2016-05-02: 60 mg via INTRAVENOUS

## 2016-05-02 MED ORDER — MIDAZOLAM HCL 5 MG/5ML IJ SOLN
INTRAMUSCULAR | Status: DC | PRN
  Administered 2016-05-02: 2 mg via INTRAVENOUS

## 2016-05-02 SURGICAL SUPPLY — 30 items
BLADE SURG 15 STRL LF DISP TIS (BLADE) ×1 IMPLANT
BLADE SURG 15 STRL SS (BLADE) ×3
CANISTER SUCT 1200ML W/VALVE (MISCELLANEOUS) ×3 IMPLANT
CHLORAPREP W/TINT 26ML (MISCELLANEOUS) ×3 IMPLANT
COVER PROBE FLX POLY STRL (MISCELLANEOUS) ×3 IMPLANT
DEVICE DUBIN SPECIMEN MAMMOGRA (MISCELLANEOUS) ×3 IMPLANT
DRAPE LAPAROTOMY TRNSV 106X77 (MISCELLANEOUS) ×3 IMPLANT
ELECT CAUTERY BLADE 6.4 (BLADE) ×3 IMPLANT
ELECT REM PT RETURN 9FT ADLT (ELECTROSURGICAL) ×3
ELECTRODE REM PT RTRN 9FT ADLT (ELECTROSURGICAL) ×1 IMPLANT
GLOVE BIO SURGEON STRL SZ7.5 (GLOVE) ×3 IMPLANT
GLOVE INDICATOR 8.0 STRL GRN (GLOVE) ×3 IMPLANT
GOWN STRL REUS W/ TWL LRG LVL3 (GOWN DISPOSABLE) ×1 IMPLANT
GOWN STRL REUS W/ TWL XL LVL3 (GOWN DISPOSABLE) ×1 IMPLANT
GOWN STRL REUS W/TWL LRG LVL3 (GOWN DISPOSABLE) ×6
GOWN STRL REUS W/TWL XL LVL3 (GOWN DISPOSABLE) ×3
LABEL OR SOLS (LABEL) ×3 IMPLANT
LIQUID BAND (GAUZE/BANDAGES/DRESSINGS) ×3 IMPLANT
MARGIN MAP 10MM (MISCELLANEOUS) ×3 IMPLANT
NDL HYPO 25X1 1.5 SAFETY (NEEDLE) ×1 IMPLANT
NEEDLE HYPO 25X1 1.5 SAFETY (NEEDLE) ×3 IMPLANT
PACK BASIN MINOR ARMC (MISCELLANEOUS) ×3 IMPLANT
SUT MNCRL 4-0 (SUTURE) ×3
SUT MNCRL 4-0 27XMFL (SUTURE) ×1
SUT SILK 2 0 SH (SUTURE) ×3 IMPLANT
SUT VIC AB 3-0 SH 27 (SUTURE) ×3
SUT VIC AB 3-0 SH 27X BRD (SUTURE) ×1 IMPLANT
SUTURE MNCRL 4-0 27XMF (SUTURE) ×1 IMPLANT
SYRINGE 10CC LL (SYRINGE) ×3 IMPLANT
WATER STERILE IRR 1000ML POUR (IV SOLUTION) ×3 IMPLANT

## 2016-05-02 NOTE — H&P (View-Only) (Signed)
Patient ID: [name], female   DOB: July 01, 1950, 65 y.o.   MRN: [id number]  CC: RIGHT BREAST MASS  HPI [name] is a 65 y.o. female who presents to clinic today for evaluation of a right breast mass. Patient reports that over the last year she is been having serial imaging exams performed due to an abnormal finding on her right breast. The first was in the upper outer quadrant was biopsied and found to be benign. Then last month she had a follow-up imaging which showed a new area of concern in the lower inner quadrant of the right breast and was biopsied and returned with atypical cells. Patient has been performing self exams but has never felt any abnormality on her self. She has no personal history of cancer or any known family history of breast cancer. She has used some hormone replacement therapy in the past, she had menarche at 74, she has had 2 pregnancies with 2 live births, she never breast-fed, she never smoked. Patient denies any current symptomatology at all, she denies any fevers, chills, nausea, vomiting, chest pain, short of breath, diarrhea, constipation. She states she's been told she is prediabetic and has been prescribed metformin in the past but is not currently taking it.  HPI  Past Medical History:  Diagnosis Date  . Asthma   . Diabetes mellitus without complication ([place])   . Hyperlipidemia   . Hypertension     Past Surgical History:  Procedure Laterality Date  . ABDOMINAL HYSTERECTOMY    . BREAST BIOPSY Right 2016   benign  . BREAST BIOPSY Right 04/08/2016   path pending  . TOTAL KNEE ARTHROPLASTY Right 2014  . TOTAL KNEE ARTHROPLASTY Right 02/16/2013    Family History  Problem Relation Age of Onset  . Anuerysm Mother     Brain  . Dementia Mother   . Heart failure Father   . CAD Father   . Dementia Father   . Diabetes Father   . Lupus Sister   . Diabetes Brother   . Throat cancer Maternal Grandfather     Social History Social History  Substance  Use Topics  . Smoking status: Never Smoker  . Smokeless tobacco: Never Used  . Alcohol use No    Allergies  Allergen Reactions  . Lisinopril Cough  . Lovastatin Other (See Comments)    Pain  . Shellfish Allergy Rash    Current Outpatient Prescriptions  Medication Sig Dispense Refill  . amLODipine (NORVASC) 5 MG tablet Take 1 tablet (5 mg total) by mouth daily. 90 tablet 3  . BREO ELLIPTA 200-25 MCG/INH AEPB inhale 1 dose once daily 90 each 3  . gabapentin (NEURONTIN) 600 MG tablet Take 6 tablets by mouth daily (Patient taking differently: 3 (three) times daily. Take 6 tablets by mouth daily) 540 tablet 1  . glucosamine-chondroitin 500-400 MG tablet Take 1 tablet by mouth 3 (three) times daily.    . hydrochlorothiazide (HYDRODIURIL) 25 MG tablet Take 1 tablet (25 mg total) by mouth daily. 90 tablet 3  . NONFORMULARY OR COMPOUNDED ITEM Compounded cream: 6% Gabapentin, 2.5% Lidocaine, 0.5% Meloxicam, 5% Methocarbamol, 2.5% Prilocaine. Sig: Apply 1-2 gm(s) (2-4 pumps) to affected area, 3-4 times/day. (1 pump = 0.5 gm) Dispense: 120 gm Pump Bottle. Dispenser: 1 pump = 0.5 gm. 1 each 0  . Olopatadine HCl 0.7 % SOLN Apply 1 drop to eye daily. 2.5 mL 0  . traZODone (DESYREL) 50 MG tablet Take 0.5-1 tablets (25-50 mg total)  by mouth at bedtime as needed for sleep. 30 tablet 3   No current facility-administered medications for this visit.      Review of Systems A Multi-point review of systems was asked and was negative except for the findings documented in the history of present illness  Physical Exam Blood pressure 140/70, pulse 90, temperature 98.2 F (36.8 C), temperature source Oral, height 5\' 3"  (1.6 m), weight 102.5 kg (226 lb). CONSTITUTIONAL: No acute distress. EYES: Pupils are equal, round, and reactive to light, Sclera are non-icteric. EARS, NOSE, MOUTH AND THROAT: The oropharynx is clear. The oral mucosa is pink and moist. Hearing is intact to voice. LYMPH NODES:  Lymph  nodes in the neck and bilateral axilla are normal. BREAST: Bilateral breast exam performed. Previous biopsy site is easily identified to the right breast. There are no dominant masses or concerning lesions in either breast. There is an area of increased density to the patient's left breast but without any concerning findings on exam. RESPIRATORY:  Lungs are clear. There is normal respiratory effort, with equal breath sounds bilaterally, and without pathologic use of accessory muscles. CARDIOVASCULAR: Heart is regular without murmurs, gallops, or rubs. GI: The abdomen is soft, nontender, and nondistended. There are no palpable masses. There is no hepatosplenomegaly. There are normal bowel sounds in all quadrants. GU: Rectal deferred.   MUSCULOSKELETAL: Normal muscle strength and tone. No cyanosis or edema.   SKIN: Turgor is good and there are no pathologic skin lesions or ulcers. NEUROLOGIC: Motor and sensation is grossly normal. Cranial nerves are grossly intact. PSYCH:  Oriented to person, place and time. Affect is normal.  Data Reviewed Images and labs reviewed. All of her previous labs performed last month are within normal limits. Imaging to the patient's breast shows a 5 mm area of density seen on mammogram and ultrasound. It is oval in nature without any overt concerning findings other than having not been there 6 months prior. The pathology report returned an atypical papillary neoplasm. Pathologist documented this could be ADH versus low-grade DCIS. I have personally reviewed the patient's imaging, laboratory findings and medical records.    Assessment    Right breast mass    Plan    65 year old female with a new right breast mass with atypical cells. Discussed at length the diagnosis with the patient as well as the treatment regimens for both atypical ductal hyperplasia as well as DCIS. Discussed that the next step in her treatment is to excise the new area so that we can better  delineate what her pathology actually is. It is most likely that an excision is all that is needed if this turns out to be ADH. The procedure of a wire localized lumpectomy was described in detail to include the risks, benefits, alternatives. Patient voiced understanding and desired to proceed. She does have a social issue of being the primary caregiver for her grandchildren but is able to accommodate procedures on Friday. We will plan to proceed to the operating room on Friday, November 17.     Time spent with the patient was 45 minutes, with more than 50% of the time spent in face-to-face education, counseling and care coordination.     Clayburn Pert, MD FACS General Surgeon 04/16/2016, 12:06 PM

## 2016-05-02 NOTE — Discharge Instructions (Signed)
Lumpectomy, Care After This sheet gives you information about how to care for yourself after your procedure. Your health care provider may also give you more specific instructions. If you have problems or questions, contact your health care provider. What can I expect after the procedure? After the procedure, it is common to have:  Breast swelling.  Breast tenderness.  Stiffness in your arm or shoulder.  A change in the shape and feel of your breast.  Scar tissue that feels hard to the touch in the area where the lump was removed. Follow these instructions at home: Bathing  Take sponge baths until your health care provider says that you can start showering or bathing. Okay to shower in 24 hours.  Do not take baths, swim, or use a hot tub until your health care provider approves. Incision care  Follow instructions from your health care provider about how to take care of your incision. Make sure you:  Wash your hands with soap and water before you change your bandage (dressing). If soap and water are not available, use hand sanitizer.  Change your dressing as told by your health care provider.  Leave stitches (sutures), skin glue, or adhesive strips in place. These skin closures may need to stay in place for 2 weeks or longer. If adhesive strip edges start to loosen and curl up, you may trim the loose edges. Do not remove adhesive strips completely unless your health care provider tells you to do that.  Check your incision area every day for signs of infection. Check for:  More redness, swelling, or pain.  More fluid or blood.  Warmth.  Pus or a bad smell.  Keep your dressing clean and dry.  If you were sent home with a surgical drain in place, follow instructions from your health care provider about emptying it. Activity  Return to your normal activities as told by your health care provider. Ask your health care provider what activities are safe for you.  Avoid  activities that require a lot of energy (are strenuous).  Be careful to avoid any activities that could cause an injury to your arm on the side of your surgery.  Do not lift anything that is heavier than 10 lb (4.5 kg). Avoid lifting with the arm that is on the side of your surgery.  Do not carry heavy objects on your shoulder.  After your drain is removed, you should perform exercises to keep your arm from getting stiff and swollen. Talk with your health care provider about which exercises are safe for you. General instructions  Take over-the-counter and prescription medicines only as told by your health care provider.  You may eat what you usually do.  Wear a supportive bra as told by your health care provider.  Raise (elevate) your arm above the level of your heart while you are sitting or lying down.  Do not wear tight jewelry on your arm, wrist, or fingers on the side of your surgery. Follow-up  Keep all follow-up visits as told by your health care provider. This is important.  You may need to be screened for extra fluid around the lymph nodes (lymphedema). Follow instructions from your health care provider about how often you should be checked.  If you had any lymph nodes removed during your procedure, be sure to tell all of your health care providers. This is important information to share before you are involved in certain procedures, such as giving blood or having your blood pressure  taken. Contact a health care provider if:  You develop a rash.  You have a fever.  Your pain medicine is not working.  Your swelling, weakness, or numbness in your arm has not improved after a few weeks.  You have new swelling in your breast or arm.  You have more redness, swelling, or pain in your incision area.  You have more fluid or blood coming from your incision.  Your incision feels warm to the touch.  You have pus or a bad smell coming from your incision. Get help right away  if:  You have very bad pain in your breast or arm.  You have chest pain.  You have difficulty breathing. This information is not intended to replace advice given to you by your health care provider. Make sure you discuss any questions you have with your health care provider. Document Released: 06/18/2006 Document Revised: 02/13/2016 Document Reviewed: 02/13/2016 Elsevier Interactive Patient Education  2017 [employer]   1) The drugs that you were given will stay in your system until tomorrow so for the next 24 hours you should not:  A) Drive an automobile B) Make any legal decisions C) Drink any alcoholic beverage   2) You may resume regular meals tomorrow.  Today it is better to start with liquids and gradually work up to solid foods.  You may eat anything you prefer, but it is better to start with liquids, then soup and crackers, and gradually work up to solid foods.   3) Please notify your doctor immediately if you have any unusual bleeding, trouble breathing, redness and pain at the surgery site, drainage, fever, or pain not relieved by medication.    4) Additional Instructions:        Please contact your physician with any problems or Same Day Surgery at [phone], Monday through Friday 6 am to 4 pm, or [place] at [facility] number at [phone].

## 2016-05-02 NOTE — Transfer of Care (Signed)
Immediate Anesthesia Transfer of Care Note  Patient: [name]  Procedure(s) Performed: Procedure(s): BREAST LUMPECTOMY WITH NEEDLE LOCALIZATION (Right)  Patient Location: PACU  Anesthesia Type:General  Level of Consciousness: sedated  Airway & Oxygen Therapy: Patient Spontanous Breathing and Patient connected to face mask oxygen  Post-op Assessment: Report given to RN and Post -op Vital signs reviewed and stable  Post vital signs: Reviewed and stable  Last Vitals:  Vitals:   05/02/16 1223 05/02/16 1615  BP: (!) 153/81 138/90  Pulse: 73 96  Resp: 18 15  Temp: 36.6 C 36.4 C    Last Pain:  Vitals:   05/02/16 1615  TempSrc:   PainSc: Asleep      Patients Stated Pain Goal: 2 ([id number])  Complications: No apparent anesthesia complications

## 2016-05-02 NOTE — Anesthesia Procedure Notes (Signed)
Procedure Name: LMA Insertion Date/Time: 05/02/2016 3:13 PM Performed by: Dionne Bucy Pre-anesthesia Checklist: Patient identified, Patient being monitored, Timeout performed, Emergency Drugs available and Suction available Patient Re-evaluated:Patient Re-evaluated prior to inductionOxygen Delivery Method: Circle system utilized Preoxygenation: Pre-oxygenation with 100% oxygen Intubation Type: IV induction Ventilation: Mask ventilation without difficulty LMA: LMA inserted LMA Size: 4.0 Tube type: Oral Number of attempts: 1 Placement Confirmation: positive ETCO2 and breath sounds checked- equal and bilateral Tube secured with: Tape Dental Injury: Teeth and Oropharynx as per pre-operative assessment

## 2016-05-02 NOTE — Op Note (Signed)
   Pre-operative Diagnosis: Right breast mass  Post-operative Diagnosis: Same  Procedure performed: Right-sided needle localized breast biopsy  Surgeon: Clayburn Pert   Assistants: PA student  Anesthesia: General LMA anesthesia  ASA Class: 2  Surgeon: Clayburn Pert, MD FACS  Anesthesia: Gen. with endotracheal tube  Assistant: PA student  Procedure Details  The patient was seen again in the Holding Room. The benefits, complications, treatment options, and expected outcomes were discussed with the patient. The risks of bleeding, infection, failure to make a final diagnosis, any of which could require further surgery were reviewed with the patient. The patient had already been to radiology and had a wire placed through the previously placed clip.  The patient was taken to Operating Room, identified as [name] and the procedure verified.  A Time Out was held and the above information confirmed.  Prior to the induction of general anesthesia, antibiotic prophylaxis was administered. VTE prophylaxis was in place. General LMA anesthesia was then administered and tolerated well. After the induction, the right breast was prepped with Chloraprep and draped in the sterile fashion. The patient was positioned in the supine position.  Using ultrasound the hydro-mark clip was identified and an incision was marked between the nipple areolar complex and the previously placed wire. The skin was localized with a 50-50 make sure 1% lidocaine 0.5 Marcaine plain. It was then incised with a 15 blade scalpel and using both electrocautery was taken down to the level of the subcutaneous fat. This was then tunneled to the wire and the wire was pulled up for incision. Using ultrasound as a guide we circumferentially went around the wire to the previously placed clip and excised the entire area in one mass. Any areas of bleeding was made hemostatic with direct elect cautery. After the mass was excised the  clip was visualized under ultrasound. Using localization markers the lateral, medial, deep margins were marked with suture and the markers.  The specimen was sent to radiology and the clip, wire, area of concern were visualized under mammography.  While the specimen was in radiology the area was completely irrigated with sterile water and then re-localized the aforementioned local anesthetic. The wound was closed with a deep interrupted 3-0 Vicryl suture and then a running subcuticular 4-0 Monocryl suture. The skin was then sealed with Dermabond. The patient was awoken from laryngeal mask anesthesia without any difficulty and tolerated the procedure well. There were no immediate, complications and all counts were correct at the end of the procedure.  Findings: Right breast mass.   Estimated Blood Loss: 5 mL         Drains: None         Specimens: Right breast mass          Complications: None                  Condition: Good   Clayburn Pert, MD, FACS

## 2016-05-02 NOTE — Anesthesia Preprocedure Evaluation (Signed)
Anesthesia Evaluation  Patient identified by MRN, date of birth, ID band Patient awake    Reviewed: Allergy & Precautions, H&P , NPO status , Patient's Chart, lab work & pertinent test results, reviewed documented beta blocker date and time   History of Anesthesia Complications Negative for: history of anesthetic complications  Airway Mallampati: I  TM Distance: >3 FB Neck ROM: full    Dental no notable dental hx. (+) Caps, Partial Upper, Missing, Teeth Intact   Pulmonary neg shortness of breath, asthma , neg sleep apnea, neg COPD, Recent URI , Residual Cough,           Cardiovascular Exercise Tolerance: Good hypertension, On Medications (-) angina(-) CAD, (-) Past MI, (-) Cardiac Stents and (-) CABG (-) dysrhythmias (-) Valvular Problems/Murmurs     Neuro/Psych neg Seizures PSYCHIATRIC DISORDERS (Depression)  Neuromuscular disease    GI/Hepatic Neg liver ROS, GERD  ,  Endo/Other  diabetes, Well Controlled, Type 2, Oral Hypoglycemic Agents  Renal/GU negative Renal ROS  negative genitourinary   Musculoskeletal   Abdominal   Peds  Hematology negative hematology ROS (+)   Anesthesia Other Findings Past Medical History: No date: Arthritis No date: Asthma No date: Diabetes mellitus without complication (HCC) No date: GERD (gastroesophageal reflux disease) No date: Hyperlipidemia No date: Hypertension No date: Neuralgia     Comment: Right side of head and face No date: Shingles     Comment: chronic on Rt side of head and face   Reproductive/Obstetrics negative OB ROS                             Anesthesia Physical Anesthesia Plan  ASA: II  Anesthesia Plan: General   Post-op Pain Management:    Induction:   Airway Management Planned:   Additional Equipment:   Intra-op Plan:   Post-operative Plan:   Informed Consent: I have reviewed the patients History and Physical, chart,  labs and discussed the procedure including the risks, benefits and alternatives for the proposed anesthesia with the patient or authorized representative who has indicated his/her understanding and acceptance.   Dental Advisory Given  Plan Discussed with: Anesthesiologist, CRNA and Surgeon  Anesthesia Plan Comments:         Anesthesia Quick Evaluation

## 2016-05-02 NOTE — Brief Op Note (Signed)
05/02/2016  4:06 PM  PATIENT:  [name]  65 y.o. female  PRE-OPERATIVE DIAGNOSIS:  mass right breast  POST-OPERATIVE DIAGNOSIS:  mass right breast  PROCEDURE:  Procedure(s): BREAST LUMPECTOMY WITH NEEDLE LOCALIZATION (Right)  SURGEON:  Surgeon(s) and Role:    * Clayburn Pert, MD - Primary  PHYSICIAN ASSISTANT:   ASSISTANTS: PA student   ANESTHESIA:   general  EBL:  Total I/O In: 700 [I.V.:700] Out: 10 [Blood:10]  BLOOD ADMINISTERED:none  DRAINS: none   LOCAL MEDICATIONS USED:  MARCAINE   , XYLOCAINE  and Amount: 15 ml  SPECIMEN:  Source of Specimen:  Right breast mass  DISPOSITION OF SPECIMEN:  PATHOLOGY  COUNTS:  YES  TOURNIQUET:  * No tourniquets in log *  DICTATION: .Dragon Dictation  PLAN OF CARE: Discharge to home after PACU  PATIENT DISPOSITION:  PACU - hemodynamically stable.   Delay start of Pharmacological VTE agent (>24hrs) due to surgical blood loss or risk of bleeding: not applicable

## 2016-05-02 NOTE — Interval H&P Note (Signed)
History and Physical Interval Note:  05/02/2016 2:40 PM  [name]  has presented today for surgery, with the diagnosis of mass right breast  The various methods of treatment have been discussed with the patient and family. After consideration of risks, benefits and other options for treatment, the patient has consented to  Procedure(s): BREAST LUMPECTOMY WITH NEEDLE LOCALIZATION (Right) as a surgical intervention .  The patient's history has been reviewed, patient examined, no change in status, stable for surgery.  I have reviewed the patient's chart and labs.  Questions were answered to the patient's satisfaction.     Clayburn Pert

## 2016-05-05 ENCOUNTER — Encounter: Payer: Self-pay | Admitting: General Surgery

## 2016-05-05 NOTE — Anesthesia Postprocedure Evaluation (Signed)
Anesthesia Post Note  Patient: [name]  Procedure(s) Performed: Procedure(s) (LRB): BREAST LUMPECTOMY WITH NEEDLE LOCALIZATION (Right)  Patient location during evaluation: PACU Anesthesia Type: General Level of consciousness: awake and alert Pain management: pain level controlled Vital Signs Assessment: post-procedure vital signs reviewed and stable Respiratory status: spontaneous breathing, nonlabored ventilation, respiratory function stable and patient connected to nasal cannula oxygen Cardiovascular status: blood pressure returned to baseline and stable Postop Assessment: no signs of nausea or vomiting Anesthetic complications: no    Last Vitals:  Vitals:   05/02/16 1702 05/02/16 1742  BP: (!) 157/95 (!) 164/90  Pulse: 88 83  Resp:    Temp: 36.2 C     Last Pain:  Vitals:   05/05/16 0911  TempSrc:   PainSc: 0-No pain                 Martha Clan

## 2016-05-06 LAB — SURGICAL PATHOLOGY

## 2016-05-12 ENCOUNTER — Telehealth: Payer: Self-pay | Admitting: General Surgery

## 2016-05-12 NOTE — Telephone Encounter (Signed)
Called patient and left her a voicemail to return my call. 

## 2016-05-12 NOTE — Telephone Encounter (Signed)
Patient called and stated that she had surgery on 05/09/16, and no one has called to give her the results. Please advise.

## 2016-05-13 ENCOUNTER — Telehealth: Payer: Self-pay | Admitting: General Surgery

## 2016-05-13 NOTE — Telephone Encounter (Signed)
Called patient back to inform her of her pathology report. Patient did not have any further questions. I reminded her that she needed to come in on 05/21/2016 to see Dr. Adonis Huguenin. Patient remembered her appointment and stated that she would be here.

## 2016-05-13 NOTE — Telephone Encounter (Signed)
Patient returned your phone call regarding results

## 2016-05-13 NOTE — Telephone Encounter (Signed)
Called patient back again and had to leave her a voicemail.

## 2016-05-14 DIAGNOSIS — E119 Type 2 diabetes mellitus without complications: Secondary | ICD-10-CM | POA: Diagnosis not present

## 2016-05-20 ENCOUNTER — Ambulatory Visit (INDEPENDENT_AMBULATORY_CARE_PROVIDER_SITE_OTHER): Payer: Medicare Other | Admitting: General Surgery

## 2016-05-20 ENCOUNTER — Encounter: Payer: Self-pay | Admitting: General Surgery

## 2016-05-20 VITALS — BP 127/87 | HR 79 | Temp 98.0°F | Ht 63.0 in | Wt 226.0 lb

## 2016-05-20 DIAGNOSIS — Z4889 Encounter for other specified surgical aftercare: Secondary | ICD-10-CM

## 2016-05-20 NOTE — Progress Notes (Signed)
Outpatient Surgical Follow Up  05/20/2016  [name] is an 65 y.o. female.   Chief Complaint  Patient presents with  . Routine Post Op    Right Breast Lumpectomy-Dr.Dayona Shaheen-05/02/16    HPI: 65 year old female returns to clinic 3 weeks status post right breast lumpectomy. Patient reports doing well. She denies any fevers, chills, nausea, vomiting, chest pain, shortness breath, diarrhea, constipation. She states the area is healing well and the glue is still intact. She only has occasional tenderness to deep palpation. She's been very happy with her surgical experience.  Past Medical History:  Diagnosis Date  . Arthritis   . Asthma   . Diabetes mellitus without complication ([place])   . GERD (gastroesophageal reflux disease)   . Hyperlipidemia   . Hypertension   . Neuralgia    Right side of head and face  . Shingles    chronic on Rt side of head and face    Past Surgical History:  Procedure Laterality Date  . ABDOMINAL HYSTERECTOMY    . BREAST BIOPSY Right 2016   benign  . BREAST BIOPSY Right 04/08/2016   path pending  . BREAST LUMPECTOMY WITH NEEDLE LOCALIZATION Right 05/02/2016   Procedure: BREAST LUMPECTOMY WITH NEEDLE LOCALIZATION;  Surgeon: Clayburn Pert, MD;  Location: [facility] ORS;  Service: General;  Laterality: Right;  . CHOLECYSTECTOMY    . TOTAL KNEE ARTHROPLASTY Right 2014  . TOTAL KNEE ARTHROPLASTY Right 02/16/2013    Family History  Problem Relation Age of Onset  . Anuerysm Mother     Brain  . Dementia Mother   . Heart failure Father   . CAD Father   . Dementia Father   . Diabetes Father   . Lupus Sister   . Diabetes Brother   . Throat cancer Maternal Grandfather     Social History:  reports that she has never smoked. She has never used smokeless tobacco. She reports that she drinks alcohol. She reports that she does not use drugs.  Allergies:  Allergies  Allergen Reactions  . Lisinopril Cough  . Lovastatin Other (See Comments)    Pain  .  Shellfish Allergy Rash    Medications reviewed.    ROS A multipoint review of systems was completed, all pertinent positives and negatives are documented within the history of present illness the remainder are negative.   BP 127/87   Pulse 79   Temp 98 F (36.7 C) (Oral)   Ht 5\' 3"  (1.6 m)   Wt 102.5 kg (226 lb)   BMI 40.03 kg/m   Physical Exam  Gen.: No acute distress Neck: Supple and nontender Breast: Right breast examined with surgical glue still in place to the medial aspect. No evidence of erythema, drainage, fluid collection. Chest: Clear to auscultation Heart: Regular rhythm Abdomen: Soft and nontender   No results found for this or any previous visit (from the past 48 hour(s)). No results found.  Assessment/Plan:  1. Aftercare following surgery 65 year old female status post right breast lumpectomy. Doing very well. Pathology reviewed which showed benign columnar changes no evidence of malignancy. Patient voiced understanding. Discussed signs and symptoms of infection and to return to clinic immediately should they occur. Otherwise she'll need a mammogram in 6 months to become her new baseline and can follow-up on an as-needed basis.     Clayburn Pert, MD FACS General Surgeon  05/20/2016,11:06 AM

## 2016-05-20 NOTE — Patient Instructions (Signed)
Please call our office with any questions or concerns.  Use sun block to incision area over the next year if this area will be exposed to sun. This helps decrease scarring.  You may now resume your normal activities. Listen to your body when lifting, if you have pain when lifting, stop and then try again in a few days.  If you develop redness, drainage, or pain at incision sites- call our office immediately and speak with a nurse.  We will call you with your Screening Mammogram appointment in 6 months.   Remember to do your monthly breast exams. If you notice any chances, please call us.

## 2016-06-23 ENCOUNTER — Other Ambulatory Visit: Payer: Self-pay

## 2016-06-23 DIAGNOSIS — M792 Neuralgia and neuritis, unspecified: Secondary | ICD-10-CM

## 2016-06-23 DIAGNOSIS — G479 Sleep disorder, unspecified: Secondary | ICD-10-CM

## 2016-06-23 MED ORDER — GABAPENTIN 600 MG PO TABS
ORAL_TABLET | ORAL | 1 refills | Status: DC
Start: 2016-06-23 — End: 2016-10-29

## 2016-06-23 MED ORDER — TRAZODONE HCL 50 MG PO TABS: 25.0000 mg | ORAL_TABLET | Freq: Every evening | ORAL | 3 refills | Status: DC | PRN

## 2016-06-23 NOTE — Telephone Encounter (Signed)
Pt called requesting refills of Gabapentin and Trazodone be sent to [facility]. Renaldo Fiddler, CMA

## 2016-07-10 DIAGNOSIS — M1712 Unilateral primary osteoarthritis, left knee: Secondary | ICD-10-CM | POA: Diagnosis not present

## 2016-07-10 DIAGNOSIS — M25562 Pain in left knee: Secondary | ICD-10-CM | POA: Diagnosis not present

## 2016-07-10 DIAGNOSIS — R262 Difficulty in walking, not elsewhere classified: Secondary | ICD-10-CM | POA: Diagnosis not present

## 2016-07-17 DIAGNOSIS — M25562 Pain in left knee: Secondary | ICD-10-CM | POA: Diagnosis not present

## 2016-07-17 DIAGNOSIS — M1712 Unilateral primary osteoarthritis, left knee: Secondary | ICD-10-CM | POA: Diagnosis not present

## 2016-07-24 ENCOUNTER — Encounter: Payer: Self-pay | Admitting: Pain Medicine

## 2016-07-24 ENCOUNTER — Ambulatory Visit: Payer: Medicare Other | Attending: Pain Medicine | Admitting: Pain Medicine

## 2016-07-24 VITALS — BP 121/60 | HR 89 | Temp 97.9°F | Resp 18 | Ht 63.0 in | Wt 224.0 lb

## 2016-07-24 DIAGNOSIS — Z79891 Long term (current) use of opiate analgesic: Secondary | ICD-10-CM | POA: Insufficient documentation

## 2016-07-24 DIAGNOSIS — B0222 Postherpetic trigeminal neuralgia: Secondary | ICD-10-CM | POA: Diagnosis not present

## 2016-07-24 DIAGNOSIS — E1159 Type 2 diabetes mellitus with other circulatory complications: Secondary | ICD-10-CM | POA: Insufficient documentation

## 2016-07-24 DIAGNOSIS — Z7984 Long term (current) use of oral hypoglycemic drugs: Secondary | ICD-10-CM | POA: Insufficient documentation

## 2016-07-24 DIAGNOSIS — B0229 Other postherpetic nervous system involvement: Secondary | ICD-10-CM | POA: Diagnosis not present

## 2016-07-24 DIAGNOSIS — E669 Obesity, unspecified: Secondary | ICD-10-CM | POA: Diagnosis not present

## 2016-07-24 DIAGNOSIS — F419 Anxiety disorder, unspecified: Secondary | ICD-10-CM | POA: Diagnosis not present

## 2016-07-24 DIAGNOSIS — G500 Trigeminal neuralgia: Secondary | ICD-10-CM | POA: Diagnosis not present

## 2016-07-24 DIAGNOSIS — I152 Hypertension secondary to endocrine disorders: Secondary | ICD-10-CM | POA: Insufficient documentation

## 2016-07-24 DIAGNOSIS — Z9889 Other specified postprocedural states: Secondary | ICD-10-CM | POA: Diagnosis not present

## 2016-07-24 DIAGNOSIS — I10 Essential (primary) hypertension: Secondary | ICD-10-CM | POA: Insufficient documentation

## 2016-07-24 DIAGNOSIS — J45909 Unspecified asthma, uncomplicated: Secondary | ICD-10-CM | POA: Insufficient documentation

## 2016-07-24 DIAGNOSIS — Z6839 Body mass index (BMI) 39.0-39.9, adult: Secondary | ICD-10-CM | POA: Diagnosis not present

## 2016-07-24 DIAGNOSIS — K219 Gastro-esophageal reflux disease without esophagitis: Secondary | ICD-10-CM | POA: Diagnosis not present

## 2016-07-24 DIAGNOSIS — Z86010 Personal history of colonic polyps: Secondary | ICD-10-CM | POA: Insufficient documentation

## 2016-07-24 DIAGNOSIS — N6310 Unspecified lump in the right breast, unspecified quadrant: Secondary | ICD-10-CM | POA: Diagnosis not present

## 2016-07-24 DIAGNOSIS — E7800 Pure hypercholesterolemia, unspecified: Secondary | ICD-10-CM | POA: Insufficient documentation

## 2016-07-24 DIAGNOSIS — E119 Type 2 diabetes mellitus without complications: Secondary | ICD-10-CM | POA: Diagnosis not present

## 2016-07-24 DIAGNOSIS — Z96651 Presence of right artificial knee joint: Secondary | ICD-10-CM | POA: Insufficient documentation

## 2016-07-24 DIAGNOSIS — G894 Chronic pain syndrome: Secondary | ICD-10-CM | POA: Insufficient documentation

## 2016-07-24 DIAGNOSIS — Z9049 Acquired absence of other specified parts of digestive tract: Secondary | ICD-10-CM | POA: Insufficient documentation

## 2016-07-24 DIAGNOSIS — G501 Atypical facial pain: Secondary | ICD-10-CM

## 2016-07-24 DIAGNOSIS — F329 Major depressive disorder, single episode, unspecified: Secondary | ICD-10-CM | POA: Insufficient documentation

## 2016-07-24 DIAGNOSIS — M792 Neuralgia and neuritis, unspecified: Secondary | ICD-10-CM | POA: Diagnosis not present

## 2016-07-24 DIAGNOSIS — Z7982 Long term (current) use of aspirin: Secondary | ICD-10-CM | POA: Insufficient documentation

## 2016-07-24 DIAGNOSIS — F1190 Opioid use, unspecified, uncomplicated: Secondary | ICD-10-CM | POA: Insufficient documentation

## 2016-07-24 DIAGNOSIS — Z79899 Other long term (current) drug therapy: Secondary | ICD-10-CM | POA: Insufficient documentation

## 2016-07-24 DIAGNOSIS — R209 Unspecified disturbances of skin sensation: Secondary | ICD-10-CM

## 2016-07-24 MED ORDER — NONFORMULARY OR COMPOUNDED ITEM: 99 refills | Status: DC

## 2016-07-24 MED ORDER — PREGABALIN 75 MG PO CAPS: 75.0000 mg | ORAL_CAPSULE | Freq: Three times a day (TID) | ORAL | 0 refills | Status: DC

## 2016-07-24 NOTE — Progress Notes (Signed)
Patient's Name: [name]  MRN: [id number]  Referring Provider: Margo Common, [place]  DOB: 1950/09/01  PCP: Margo Common, PA  DOS: 07/24/2016  Note by: Kathlen Brunswick. Dossie Arbour, MD  Service setting: Ambulatory outpatient  Specialty: Interventional Pain Management  Location: [facility] (AMB) Pain Management Facility    Patient type: Established   Primary Reason(s) for Visit: Encounter for prescription drug management (Level of risk: moderate) CC: Headache (right side of head)  HPI  [name] is a 66 y.o. year old, female patient, who comes today for a medication management evaluation. She has Postherpetic neuralgia; Depression; Obesity (BMI 35.0-39.9 without comorbidity) ([place]); Neuropathic postherpetic trigeminal neuralgia (Right) (V1); Anxiety; Asthma; Diabetes ([place]); Photopsia; History of colon polyps; Calcium deficiency disease; Cannot sleep; Hypercholesterolemia without hypertriglyceridemia; Avitaminosis A; Avitaminosis D; Gasserian ganglionitis; Neuropathic pain; Atypical facial pain (Right); Chronic pain syndrome; Encounter for long-term (current) use of medications; Disturbance of skin sensation; Occipital headache; Bilateral occipital neuralgia; Breast mass, right; Herpes zoster ophthalmicus; Hypertension; Long term current use of opiate analgesic; Long term prescription opiate use; and Opiate use on her problem list. Her primarily concern today is the Headache (right side of head)  Pain Assessment: Self-Reported Pain Score: 6 /10 Clinically the patient looks like a 2/10 Reported level is inconsistent with clinical observations. Information on the proper use of the pain scale provided to the patient today Pain Type: Chronic pain Pain Location: Head Pain Orientation: Right Pain Descriptors / Indicators: Stabbing, Aching, Tender Pain Frequency: Constant  [name] was last seen on 04/02/2016 for medication management. During today's appointment we reviewed [name]'s chronic pain status,  as well as her outpatient medication regimen. The patient indicates that the cream that we provided her with seems to be working. Today we have given her several refills on it. In addition, the patient indicates that although she is taking the gabapentin and it does help, she still having some problems. She denies having tried any Lyrica in the past and therefore we will go ahead and do a trial on it. She is taking Neurontin 600 mg 6 times a day. We have instructed the patient to start taking Lyrica 75 mg once a day and for every pill that she increases per day, up to 3 times a day, she should drop the Neurontin by 1-2 pills. By the end of this transition. She should be taking Lyrica 75 mg 3 times a day with no Neurontin. If this works for her but is not enough we will increase it further. In addition the patient has requested that we again try some nerve blocks for her trigeminal neuralgia. We have not done any blocks in the past but she has had some injections done by other practices. She is here to see what is said that we can do for her. I will be scheduling her to come back for a trigeminal nerve block of V1.  The patient  reports that she does not use drugs. Her body mass index is 39.68 kg/m.  Further details on both, my assessment(s), as well as the proposed treatment plan, please see below.  Controlled Substance Pharmacotherapy Assessment REMS (Risk Evaluation and Mitigation Strategy)  Analgesic: Hydrocodone/APAP 7.5/325 one Q6 hours when necessary for pain. MME/day:'30mg'$ /day Zenovia Jarred, RN  07/24/2016  8:46 AM  Signed Safety precautions to be maintained throughout the outpatient stay will include: orient to surroundings, keep bed in low position, maintain call bell within reach at all times, provide assistance with transfer out of bed  and ambulation.   Pharmacokinetics: Liberation and absorption (onset of action): WNL Distribution (time to peak effect): WNL Metabolism and excretion (duration  of action): WNL         Pharmacodynamics: Desired effects: Analgesia: [name] reports >50% benefit. Functional ability: Patient reports that medication allows her to accomplish basic ADLs Clinically meaningful improvement in function (CMIF): Sustained CMIF goals met Perceived effectiveness: Described as relatively effective, allowing for increase in activities of daily living (ADL) Undesirable effects: Side-effects or Adverse reactions: None reported Monitoring: [place] PMP: Online review of the past 28-monthperiod conducted. Compliant with practice rules and regulations List of all UDS test(s) done:  Lab Results  Component Value Date   SUMMARY FINAL 01/21/2016   Last UDS on record: No results found for: TOXASSSELUR UDS interpretation: Compliant          Medication Assessment Form: Reviewed. Patient indicates being compliant with therapy Treatment compliance: Compliant Risk Assessment Profile: Aberrant behavior: See prior evaluations. None observed or detected today Comorbid factors increasing risk of overdose: See prior notes. No additional risks detected today Risk of substance use disorder (SUD): Low Opioid Risk Tool (ORT) Total Score: 10  Interpretation Table:  Score <3 = Low Risk for SUD  Score between 4-7 = Moderate Risk for SUD  Score >8 = High Risk for Opioid Abuse   Risk Mitigation Strategies:  Patient Counseling: Covered Patient-Prescriber Agreement (PPA): Present and active  Notification to other healthcare providers: Done  Pharmacologic Plan: No change in therapy, at this time  Laboratory Chemistry  Inflammation Markers Lab Results  Component Value Date   ESRSEDRATE 7 01/21/2016   CRP 1.1 (H) 01/21/2016   Renal Function Lab Results  Component Value Date   BUN 12 04/24/2016   CREATININE 1.08 (H) 04/24/2016   GFRAA >60 04/24/2016   GFRNONAA 52 (L) 04/24/2016   Hepatic Function Lab Results  Component Value Date   AST 15 03/28/2016   ALT 13 03/28/2016    ALBUMIN 4.1 03/28/2016   Electrolytes Lab Results  Component Value Date   NA 141 05/02/2016   K 3.6 05/02/2016   CL 102 04/24/2016   CALCIUM 9.1 04/24/2016   MG 2.0 01/21/2016   Pain Modulating Vitamins Lab Results  Component Value Date   25OHVITD1 39 01/21/2016   25OHVITD2 6.4 01/21/2016   25OHVITD3 33 01/21/2016   VITAMINB12 436 01/21/2016   Coagulation Parameters Lab Results  Component Value Date   INR 1.0 01/31/2013   LABPROT 13.2 01/31/2013   APTT 27.8 01/31/2013   PLT 291 03/28/2016   Cardiovascular Lab Results  Component Value Date   HGB 15.0 05/02/2016   HCT 44.0 05/02/2016   Note: Lab results reviewed.  Recent Diagnostic Imaging Review  Mm Breast Surgical Specimen  Result Date: 05/02/2016 CLINICAL DATA:  Specimen radiograph status post excisional right breast biopsy. EXAM: SPECIMEN RADIOGRAPH OF THE RIGHT BREAST COMPARISON:  Previous exam(s). FINDINGS: Status post excision of the right breast. The wire tip and biopsy marker clip are present and are marked for pathology. These findings were communicated with Dr. WAdonis Hugueninin the OR at 4 p.m. IMPRESSION: Specimen radiograph of the right breast. Electronically Signed   By: MAmmie FerrierM.D.   On: 05/02/2016 16:00   Mm Rt Plc Breast Loc Dev   1st Lesion  Inc Mammo Guide  Result Date: 05/02/2016 CLINICAL DATA:  66year old female presenting for wire localization prior to excisional right breast biopsy. EXAM: NEEDLE LOCALIZATION OF THE RIGHT BREAST WITH MAMMO GUIDANCE COMPARISON:  Previous exams. FINDINGS: Patient presents for needle localization prior to excisional right breast biopsy. I met with the patient and we discussed the procedure of needle localization including benefits and alternatives. We discussed the high likelihood of a successful procedure. We discussed the risks of the procedure, including infection, bleeding, tissue injury, and further surgery. Informed, written consent was given. The usual  time-out protocol was performed immediately prior to the procedure. Using mammographic guidance, sterile technique, 1% lidocaine and a 7 cm modified Kopans needle, the cylindrical shaped biopsy marking clip in the medial right breast was localized using medial approach. The images were marked for Dr. Adonis Huguenin. IMPRESSION: Needle localization right breast. No apparent complications. Electronically Signed   By: Ammie Ferrier M.D.   On: 05/02/2016 11:50   Note: Imaging results reviewed.          Meds  The patient has a current medication list which includes the following prescription(s): amlodipine, aspirin ec, breo ellipta, calcium-vitamin d, docusate sodium, gabapentin, glucosamine-chondroitin, hydrochlorothiazide, hydrocodone-acetaminophen, meloxicam, metformin, NONFORMULARY OR COMPOUNDED ITEM, olopatadine hcl, ondansetron, pregabalin, and trazodone.  Current Outpatient Prescriptions on File Prior to Visit  Medication Sig  . amLODipine (NORVASC) 5 MG tablet Take 1 tablet (5 mg total) by mouth daily.  Marland Kitchen aspirin EC 81 MG tablet Take 81 mg by mouth daily.  Marland Kitchen BREO ELLIPTA 200-25 MCG/INH AEPB inhale 1 dose once daily (Patient taking differently: 1 PUFF ORALLY DAILY)  . docusate sodium (COLACE) 100 MG capsule Take 1 capsule (100 mg total) by mouth 2 (two) times daily.  Marland Kitchen gabapentin (NEURONTIN) 600 MG tablet Take 6 tablets by mouth daily  . glucosamine-chondroitin 500-400 MG tablet Take 1 tablet by mouth 3 (three) times daily.  . hydrochlorothiazide (HYDRODIURIL) 25 MG tablet Take 1 tablet (25 mg total) by mouth daily.  . metFORMIN (GLUCOPHAGE-XR) 500 MG 24 hr tablet Take 500 mg by mouth daily with breakfast.  . Olopatadine HCl 0.7 % SOLN Apply 1 drop to eye daily.  . ondansetron (ZOFRAN ODT) 4 MG disintegrating tablet Take 1 tablet (4 mg total) by mouth every 8 (eight) hours as needed for nausea or vomiting.  . traZODone (DESYREL) 50 MG tablet Take 0.5-1 tablets (25-50 mg total) by mouth at bedtime  as needed for sleep.   No current facility-administered medications on file prior to visit.    ROS  Constitutional: Denies any fever or chills Gastrointestinal: No reported hemesis, hematochezia, vomiting, or acute GI distress Musculoskeletal: Denies any acute onset joint swelling, redness, loss of ROM, or weakness Neurological: No reported episodes of acute onset apraxia, aphasia, dysarthria, agnosia, amnesia, paralysis, loss of coordination, or loss of consciousness  Allergies  Ms. Demarco is allergic to lisinopril; lovastatin; other; and shellfish allergy.  PFSH  Drug: Ms. Jia  reports that she does not use drugs. Alcohol:  reports that she drinks alcohol. Tobacco:  reports that she has never smoked. She has never used smokeless tobacco. Medical:  has a past medical history of Arthritis; Asthma; Diabetes mellitus without complication ([place]); GERD (gastroesophageal reflux disease); Hyperlipidemia; Hypertension; Neuralgia; and Shingles. Family: family history includes Anuerysm in her mother; CAD in her father; Dementia in her father and mother; Diabetes in her brother and father; Heart failure in her father; Lupus in her sister; Throat cancer in her maternal grandfather.  Past Surgical History:  Procedure Laterality Date  . ABDOMINAL HYSTERECTOMY    . BREAST BIOPSY Right 2016   benign  . BREAST BIOPSY Right 04/08/2016   path pending  .  BREAST LUMPECTOMY WITH NEEDLE LOCALIZATION Right 05/02/2016   Procedure: BREAST LUMPECTOMY WITH NEEDLE LOCALIZATION;  Surgeon: Clayburn Pert, MD;  Location: [facility] ORS;  Service: General;  Laterality: Right;  . CHOLECYSTECTOMY    . TOTAL KNEE ARTHROPLASTY Right 2014  . TOTAL KNEE ARTHROPLASTY Right 02/16/2013   Constitutional Exam  General appearance: Well nourished, well developed, and well hydrated. In no apparent acute distress Vitals:   07/24/16 0828  BP: 121/60  Pulse: 89  Resp: 18  Temp: 97.9 F (36.6 C)  SpO2: 97%  Weight: 224 lb  (101.6 kg)  Height: '5\' 3"'$  (1.6 m)   BMI Assessment: Estimated body mass index is 39.68 kg/m as calculated from the following:   Height as of this encounter: '5\' 3"'$  (1.6 m).   Weight as of this encounter: 224 lb (101.6 kg).  BMI interpretation table: BMI level Category Range association with higher incidence of chronic pain  <18 kg/m2 Underweight   18.5-24.9 kg/m2 Ideal body weight   25-29.9 kg/m2 Overweight Increased incidence by 20%  30-34.9 kg/m2 Obese (Class I) Increased incidence by 68%  35-39.9 kg/m2 Severe obesity (Class II) Increased incidence by 136%  >40 kg/m2 Extreme obesity (Class III) Increased incidence by 254%   BMI Readings from Last 4 Encounters:  07/24/16 39.68 kg/m  05/20/16 40.03 kg/m  05/02/16 39.15 kg/m  04/24/16 39.15 kg/m   Wt Readings from Last 4 Encounters:  07/24/16 224 lb (101.6 kg)  05/20/16 226 lb (102.5 kg)  05/02/16 221 lb (100.2 kg)  04/24/16 221 lb (100.2 kg)  Psych/Mental status: Alert, oriented x 3 (person, place, & time)       Eyes: PERLA Respiratory: No evidence of acute respiratory distress  Cervical Spine Exam  Inspection: No masses, redness, or swelling Alignment: Symmetrical Functional ROM: Unrestricted ROM Stability: No instability detected Muscle strength & Tone: Functionally intact Sensory: Unimpaired Palpation: Non-contributory  Upper Extremity (UE) Exam    Side: Right upper extremity  Side: Left upper extremity  Inspection: No masses, redness, swelling, or asymmetry. No contractures  Inspection: No masses, redness, swelling, or asymmetry. No contractures  Functional ROM: Unrestricted ROM          Functional ROM: Unrestricted ROM          Muscle strength & Tone: Functionally intact  Muscle strength & Tone: Functionally intact  Sensory: Unimpaired  Sensory: Unimpaired  Palpation: Euthermic  Palpation: Euthermic  Specialized Test(s): Deferred         Specialized Test(s): Deferred          Thoracic Spine Exam  Inspection:  No masses, redness, or swelling Alignment: Symmetrical Functional ROM: Unrestricted ROM Stability: No instability detected Sensory: Unimpaired Muscle strength & Tone: Functionally intact Palpation: Non-contributory  Lumbar Spine Exam  Inspection: No masses, redness, or swelling Alignment: Symmetrical Functional ROM: Unrestricted ROM Stability: No instability detected Muscle strength & Tone: Functionally intact Sensory: Unimpaired Palpation: Non-contributory Provocative Tests: Lumbar Hyperextension and rotation test: evaluation deferred today       Patrick's Maneuver: evaluation deferred today              Gait & Posture Assessment  Ambulation: Unassisted Gait: Relatively normal for age and body habitus Posture: WNL   Lower Extremity Exam    Side: Right lower extremity  Side: Left lower extremity  Inspection: No masses, redness, swelling, or asymmetry. No contractures  Inspection: No masses, redness, swelling, or asymmetry. No contractures  Functional ROM: Unrestricted ROM          Functional  ROM: Unrestricted ROM          Muscle strength & Tone: Functionally intact  Muscle strength & Tone: Functionally intact  Sensory: Unimpaired  Sensory: Unimpaired  Palpation: No palpable anomalies  Palpation: No palpable anomalies   Assessment  Primary Diagnosis & Pertinent Problem List: The primary encounter diagnosis was Chronic pain syndrome. Diagnoses of Atypical facial pain (Right), Neuropathic postherpetic trigeminal neuralgia (Right) (V1), Neuropathic pain, Long term current use of opiate analgesic, Long term prescription opiate use, Opiate use, Disturbance of skin sensation, and Gasserian ganglionitis were also pertinent to this visit.  Status Diagnosis  Controlled Controlled Controlled 1. Chronic pain syndrome   2. Atypical facial pain (Right)   3. Neuropathic postherpetic trigeminal neuralgia (Right) (V1)   4. Neuropathic pain   5. Long term current use of opiate analgesic    6. Long term prescription opiate use   7. Opiate use   8. Disturbance of skin sensation   9. Gasserian ganglionitis      Plan of Care  Pharmacotherapy (Medications Ordered): Meds ordered this encounter  Medications  . NONFORMULARY OR COMPOUNDED ITEM    Sig: Compounded cream: 6% Gabapentin, 2.5% Lidocaine, 0.5% Meloxicam, 5% Methocarbamol, 2.5% Prilocaine. Sig: Apply 1-2 gm(s) (2-4 pumps) to affected area, 3-4 times/day. (1 pump = 0.5 gm) Dispense: 120 gm Pump Bottle. Dispenser: 1 pump = 0.5 gm.    Dispense:  1 each    Refill:  PRN    Do not place this medication, or any other prescription from our practice, on "Automatic Refill". Patient may have prescription filled one day early if pharmacy is closed on scheduled refill date.  . pregabalin (LYRICA) 75 MG capsule    Sig: Take 1 capsule (75 mg total) by mouth 3 (three) times daily.    Dispense:  90 capsule    Refill:  0    Do not place this medication, or any other prescription from our practice, on "Automatic Refill". Patient may have prescription filled one day early if pharmacy is closed on scheduled refill date.   New Prescriptions   PREGABALIN (LYRICA) 75 MG CAPSULE    Take 1 capsule (75 mg total) by mouth 3 (three) times daily.   Medications administered today: Ms. Haft had no medications administered during this visit. Lab-work, procedure(s), and/or referral(s): Orders Placed This Encounter  Procedures  . TRIGEMINAL NERVE BLOCK  . Comprehensive metabolic panel  . C-reactive protein  . Magnesium  . Sedimentation rate  . Vitamin B12  . 25-Hydroxyvitamin D Lcms D2+D3   Imaging and/or referral(s): None  Interventional therapies: Planned, scheduled, and/or pending:   Diagnostic Right Trigeminal Nerve (V1) Block, under fluoro and IV sedation.   Considering:   Diagnostic Right Trigeminal Nerve (V1) Block, under fluoro and IV sedation.   Palliative PRN treatment(s):   None at this time    Provider-requested  follow-up: Return in about 1 month (around 08/21/2016) for (MD) Med-Mgmt.  Future Appointments Date Time Provider [place]  08/04/2016 8:30 AM Milinda Pointer, MD [facility] None   Primary Care Physician: Margo Common, PA Location: [facility] Note by: Kathlen Brunswick. Dossie Arbour, M.D, DABA, DABAPM, DABPM, DABIPP, FIPP Date: 07/24/2016; Time: 10:08 AM  Pain Score Disclaimer: We use the NRS-11 scale. This is a self-reported, subjective measurement of pain severity with only modest accuracy. It is used primarily to identify changes within a particular patient. It must be understood that outpatient pain scales are significantly less accurate that those used for research,  where they can be applied under ideal controlled circumstances with minimal exposure to variables. In reality, the score is likely to be a combination of pain intensity and pain affect, where pain affect describes the degree of emotional arousal or changes in action readiness caused by the sensory experience of pain. Factors such as social and work situation, setting, emotional state, anxiety levels, expectation, and prior pain experience may influence pain perception and show large inter-individual differences that may also be affected by time variables.  Patient instructions provided during this appointment: Patient Instructions  You were given a prescription for pain cream and Lyrica today.  Pre procedure instructions given.  Do not eat or drink for 8 hours and bring a driver.  Go get labwork drawn today in the medical mall.    Preparing for Procedure with Sedation Instructions: . Oral Intake: Do not eat or drink anything for at least 8 hours prior to your procedure. . Transportation: Public transportation is not allowed. Bring an adult driver. The driver must be physically present in our waiting room before any procedure can be started. Marland Kitchen Physical Assistance: Bring an adult capable of  physically assisting you, in the event you need help. . Blood Pressure Medicine: Take your blood pressure medicine with a sip of water the morning of the procedure. . Insulin: Take only  of your normal insulin dose. . Preventing infections: Shower with an antibacterial soap the morning of your procedure. . Build-up your immune system: Take 1000 mg of Vitamin C with every meal (3 times a day) the day prior to your procedure. . Pregnancy: If you are pregnant, call and cancel the procedure. . Sickness: If you have a cold, fever, or any active infections, call and cancel the procedure. . Arrival: You must be in the facility at least 30 minutes prior to your scheduled procedure. . Children: Do not bring children with you. . Dress appropriately: Bring dark clothing that you would not mind if they get stained. . Valuables: Do not bring any jewelry or valuables. Procedure appointments are reserved for interventional treatments only. Marland Kitchen No Prescription Refills. . No medication changes will be discussed during procedure appointments. No disability issues will be discussed.Risk(s) and Possible Complications  Patient Responsibilities: It is important that you read this as it is part of your informed consent. It is our duty to inform you of the risks and possible complications associated with treatments offered to you. It is your responsibility as a patient to read this and to ask questions about anything that is not clear or that you believe was not covered in this document.  Patient's Rights: You have the right to refuse treatment. You also have the right to change your mind, even after initially having agreed to have the treatment done. However, under this last option, if you wait until the last second to change your mind, you may be charged for the materials used up to that point.  Introduction: Medicine is not an Chief Strategy Officer. Everything in Medicine, including the lack of treatment(s), carries the  potential for danger, harm, or loss (which is by definition: Risk). In Medicine, a complication is a secondary problem, condition, or disease that can aggravate an already existing one. All treatments carry the risk of possible complications. The fact that a side effects or complications occurs, does not imply that the treatment was conducted incorrectly. It must be clearly understood that these can happen even when everything is done following the highest safety standards.  No treatment:  You can choose not to proceed with the proposed treatment alternative. The "PRO(s)" would include: avoiding the risk of complications associated with the therapy. The "CON(s)" would include: not getting any of the treatment benefits. These benefits fall under one of three categories: diagnostic; therapeutic; and/or palliative. Diagnostic benefits include: getting information which can ultimately lead to improvement of the disease or symptom(s). Therapeutic benefits are those associated with the successful treatment of the disease. Finally, palliative benefits are those related to the decrease of the primary symptoms, without necessarily curing the condition (example: decreasing the pain from a flare-up of a chronic condition, such as incurable terminal cancer).  General Risks and Complications: These are associated to most interventional treatments. They can occur alone, or in combination. They fall under one of the following six (6) categories: no benefit or worsening of symptoms; bleeding; infection; nerve damage; allergic reactions; and/or death. No benefits or worsening of symptoms: In Medicine there are no guarantees, only probabilities. No healthcare provider can ever guarantee that a medical treatment will work, they can only state the probability that it may. Furthermore, there is always the possibility that the condition may worsen, either directly, or indirectly, as a consequence of the treatment. Bleeding: This is  more common if the patient is taking a blood thinner, either prescription or over the counter (example: Goody Powders, Fish oil, Aspirin, Garlic, etc.), or if suffering a condition associated with impaired coagulation (example: Hemophilia, cirrhosis of the liver, low platelet counts, etc.). However, even if you do not have one on these, it can still happen. If you have any of these conditions, or take one of these drugs, make sure to notify your treating physician. Infection: This is more common in patients with a compromised immune system, either due to disease (example: diabetes, cancer, human immunodeficiency virus [HIV], etc.), or due to medications or treatments (example: therapies used to treat cancer and rheumatological diseases). However, even if you do not have one on these, it can still happen. If you have any of these conditions, or take one of these drugs, make sure to notify your treating physician. Nerve Damage: This is more common when the treatment is an invasive one, but it can also happen with the use of medications, such as those used in the treatment of cancer. The damage can occur to small secondary nerves, or to large primary ones, such as those in the spinal cord and brain. This damage may be temporary or permanent and it may lead to impairments that can range from temporary numbness to permanent paralysis and/or brain death. Allergic Reactions: Any time a substance or material comes in contact with our body, there is the possibility of an allergic reaction. These can range from a mild skin rash (contact dermatitis) to a severe systemic reaction (anaphylactic reaction), which can result in death. Death: In general, any medical intervention can result in death, most of the time due to an unforeseen complication. Marland Kitchen

## 2016-07-24 NOTE — Patient Instructions (Signed)
You were given a prescription for pain cream and Lyrica today.  Pre procedure instructions given.  Do not eat or drink for 8 hours and bring a driver.  Go get labwork drawn today in the medical mall.    Preparing for Procedure with Sedation Instructions: . Oral Intake: Do not eat or drink anything for at least 8 hours prior to your procedure. . Transportation: Public transportation is not allowed. Bring an adult driver. The driver must be physically present in our waiting room before any procedure can be started. Marland Kitchen Physical Assistance: Bring an adult capable of physically assisting you, in the event you need help. . Blood Pressure Medicine: Take your blood pressure medicine with a sip of water the morning of the procedure. . Insulin: Take only  of your normal insulin dose. . Preventing infections: Shower with an antibacterial soap the morning of your procedure. . Build-up your immune system: Take 1000 mg of Vitamin C with every meal (3 times a day) the day prior to your procedure. . Pregnancy: If you are pregnant, call and cancel the procedure. . Sickness: If you have a cold, fever, or any active infections, call and cancel the procedure. . Arrival: You must be in the facility at least 30 minutes prior to your scheduled procedure. . Children: Do not bring children with you. . Dress appropriately: Bring dark clothing that you would not mind if they get stained. . Valuables: Do not bring any jewelry or valuables. Procedure appointments are reserved for interventional treatments only. Marland Kitchen No Prescription Refills. . No medication changes will be discussed during procedure appointments. No disability issues will be discussed.Risk(s) and Possible Complications  Patient Responsibilities: It is important that you read this as it is part of your informed consent. It is our duty to inform you of the risks and possible complications associated with treatments offered to you. It is your responsibility as  a patient to read this and to ask questions about anything that is not clear or that you believe was not covered in this document.  Patient's Rights: You have the right to refuse treatment. You also have the right to change your mind, even after initially having agreed to have the treatment done. However, under this last option, if you wait until the last second to change your mind, you may be charged for the materials used up to that point.  Introduction: Medicine is not an Chief Strategy Officer. Everything in Medicine, including the lack of treatment(s), carries the potential for danger, harm, or loss (which is by definition: Risk). In Medicine, a complication is a secondary problem, condition, or disease that can aggravate an already existing one. All treatments carry the risk of possible complications. The fact that a side effects or complications occurs, does not imply that the treatment was conducted incorrectly. It must be clearly understood that these can happen even when everything is done following the highest safety standards.  No treatment: You can choose not to proceed with the proposed treatment alternative. The "PRO(s)" would include: avoiding the risk of complications associated with the therapy. The "CON(s)" would include: not getting any of the treatment benefits. These benefits fall under one of three categories: diagnostic; therapeutic; and/or palliative. Diagnostic benefits include: getting information which can ultimately lead to improvement of the disease or symptom(s). Therapeutic benefits are those associated with the successful treatment of the disease. Finally, palliative benefits are those related to the decrease of the primary symptoms, without necessarily curing the condition (example: decreasing the  pain from a flare-up of a chronic condition, such as incurable terminal cancer).  General Risks and Complications: These are associated to most interventional treatments. They can occur  alone, or in combination. They fall under one of the following six (6) categories: no benefit or worsening of symptoms; bleeding; infection; nerve damage; allergic reactions; and/or death. No benefits or worsening of symptoms: In Medicine there are no guarantees, only probabilities. No healthcare provider can ever guarantee that a medical treatment will work, they can only state the probability that it may. Furthermore, there is always the possibility that the condition may worsen, either directly, or indirectly, as a consequence of the treatment. Bleeding: This is more common if the patient is taking a blood thinner, either prescription or over the counter (example: Goody Powders, Fish oil, Aspirin, Garlic, etc.), or if suffering a condition associated with impaired coagulation (example: Hemophilia, cirrhosis of the liver, low platelet counts, etc.). However, even if you do not have one on these, it can still happen. If you have any of these conditions, or take one of these drugs, make sure to notify your treating physician. Infection: This is more common in patients with a compromised immune system, either due to disease (example: diabetes, cancer, human immunodeficiency virus [HIV], etc.), or due to medications or treatments (example: therapies used to treat cancer and rheumatological diseases). However, even if you do not have one on these, it can still happen. If you have any of these conditions, or take one of these drugs, make sure to notify your treating physician. Nerve Damage: This is more common when the treatment is an invasive one, but it can also happen with the use of medications, such as those used in the treatment of cancer. The damage can occur to small secondary nerves, or to large primary ones, such as those in the spinal cord and brain. This damage may be temporary or permanent and it may lead to impairments that can range from temporary numbness to permanent paralysis and/or brain  death. Allergic Reactions: Any time a substance or material comes in contact with our body, there is the possibility of an allergic reaction. These can range from a mild skin rash (contact dermatitis) to a severe systemic reaction (anaphylactic reaction), which can result in death. Death: In general, any medical intervention can result in death, most of the time due to an unforeseen complication. Marland Kitchen

## 2016-07-24 NOTE — Progress Notes (Signed)
Safety precautions to be maintained throughout the outpatient stay will include: orient to surroundings, keep bed in low position, maintain call bell within reach at all times, provide assistance with transfer out of bed and ambulation.  

## 2016-07-25 ENCOUNTER — Other Ambulatory Visit
Admission: RE | Admit: 2016-07-25 | Discharge: 2016-07-25 | Disposition: A | Discharge status: Home / Self Care | Payer: Medicare Other | Source: Ambulatory Visit | Attending: Pain Medicine | Admitting: Pain Medicine

## 2016-07-25 DIAGNOSIS — R209 Unspecified disturbances of skin sensation: Secondary | ICD-10-CM | POA: Insufficient documentation

## 2016-07-25 DIAGNOSIS — G894 Chronic pain syndrome: Secondary | ICD-10-CM | POA: Insufficient documentation

## 2016-07-25 LAB — COMPREHENSIVE METABOLIC PANEL
ALBUMIN: 3.7 g/dL (ref 3.5–5.0)
ALT: 14 U/L (ref 14–54)
ANION GAP: 8 (ref 5–15)
AST: 14 U/L — ABNORMAL LOW (ref 15–41)
Alkaline Phosphatase: 85 U/L (ref 38–126)
BUN: 17 mg/dL (ref 6–20)
CO2: 29 mmol/L (ref 22–32)
Calcium: 8.8 mg/dL — ABNORMAL LOW (ref 8.9–10.3)
Chloride: 100 mmol/L — ABNORMAL LOW (ref 101–111)
Creatinine, Ser: 0.82 mg/dL (ref 0.44–1.00)
GFR calc non Af Amer: >60 mL/min (ref >60)
GLUCOSE: 97 mg/dL (ref 65–99)
POTASSIUM: 3.4 mmol/L — AB (ref 3.5–5.1)
SODIUM: 137 mmol/L (ref 135–145)
TOTAL PROTEIN: 7.6 g/dL (ref 6.5–8.1)
Total Bilirubin: 0.8 mg/dL (ref 0.3–1.2)

## 2016-07-25 LAB — SEDIMENTATION RATE: SED RATE: 13 mm/h (ref 0–30)

## 2016-07-25 LAB — MAGNESIUM: MAGNESIUM: 2 mg/dL (ref 1.7–2.4)

## 2016-07-25 LAB — VITAMIN B12: VITAMIN B 12: 375 pg/mL (ref 180–914)

## 2016-07-25 LAB — C-REACTIVE PROTEIN: CRP: 1.4 mg/dL — ABNORMAL HIGH (ref <1.0)

## 2016-07-28 LAB — 25-HYDROXY VITAMIN D LCMS D2+D3
25-Hydroxy, Vitamin D-2: 5.1 ng/mL
25-Hydroxy, Vitamin D-3: 27 ng/mL
25-Hydroxy, Vitamin D: 32 ng/mL

## 2016-08-04 ENCOUNTER — Encounter: Payer: Self-pay | Admitting: Pain Medicine

## 2016-08-04 ENCOUNTER — Ambulatory Visit (HOSPITAL_BASED_OUTPATIENT_CLINIC_OR_DEPARTMENT_OTHER): Payer: Medicare Other | Admitting: Pain Medicine

## 2016-08-04 ENCOUNTER — Ambulatory Visit
Admission: RE | Admit: 2016-08-04 | Discharge: 2016-08-04 | Disposition: A | Discharge status: Home / Self Care | Payer: Medicare Other | Source: Ambulatory Visit | Attending: Pain Medicine | Admitting: Pain Medicine

## 2016-08-04 VITALS — BP 133/85 | HR 69 | Temp 97.7°F | Resp 14 | Ht 63.0 in | Wt 210.0 lb

## 2016-08-04 DIAGNOSIS — Z96651 Presence of right artificial knee joint: Secondary | ICD-10-CM | POA: Insufficient documentation

## 2016-08-04 DIAGNOSIS — Z9049 Acquired absence of other specified parts of digestive tract: Secondary | ICD-10-CM | POA: Insufficient documentation

## 2016-08-04 DIAGNOSIS — B0229 Other postherpetic nervous system involvement: Secondary | ICD-10-CM

## 2016-08-04 DIAGNOSIS — B0222 Postherpetic trigeminal neuralgia: Secondary | ICD-10-CM

## 2016-08-04 DIAGNOSIS — G501 Atypical facial pain: Secondary | ICD-10-CM | POA: Diagnosis not present

## 2016-08-04 LAB — GLUCOSE, CAPILLARY: Glucose-Capillary: 100 mg/dL — ABNORMAL HIGH (ref 65–99)

## 2016-08-04 MED ORDER — LACTATED RINGERS IV SOLN
1000.0000 mL | Freq: Once | INTRAVENOUS | Status: AC
  Administered 2016-08-04: 1000 mL via INTRAVENOUS

## 2016-08-04 MED ORDER — ROPIVACAINE HCL 2 MG/ML IJ SOLN
2.0000 mL | Freq: Once | INTRAMUSCULAR | Status: AC
  Administered 2016-08-04: 2 mL via EPIDURAL
  Filled 2016-08-04: qty 20

## 2016-08-04 MED ORDER — TRIAMCINOLONE ACETONIDE 40 MG/ML IJ SUSP
40.0000 mg | Freq: Once | INTRAMUSCULAR | Status: AC
  Administered 2016-08-04: 40 mg
  Filled 2016-08-04: qty 1

## 2016-08-04 MED ORDER — MIDAZOLAM HCL 5 MG/5ML IJ SOLN
1.0000 mg | INTRAMUSCULAR | Status: DC | PRN
  Administered 2016-08-04: 2 mg via INTRAVENOUS
  Administered 2016-08-04: 1 mg via INTRAVENOUS
  Filled 2016-08-04: qty 5

## 2016-08-04 MED ORDER — FENTANYL CITRATE (PF) 100 MCG/2ML IJ SOLN
25.0000 ug | INTRAMUSCULAR | Status: DC | PRN
  Administered 2016-08-04: 50 ug via INTRAVENOUS
  Filled 2016-08-04: qty 2

## 2016-08-04 MED ORDER — LIDOCAINE HCL (PF) 1 % IJ SOLN
2.0000 mL | Freq: Once | INTRAMUSCULAR | Status: AC
  Administered 2016-08-04: 2 mL
  Filled 2016-08-04: qty 5

## 2016-08-04 NOTE — Progress Notes (Signed)
Patient's Name: [name]  MRN: [id number]  Referring Provider: Milinda Pointer, MD  DOB: [birth year]  PCP: Margo Common, PA  DOS: 08/04/2016  Note by: Kathlen Brunswick. Dossie Arbour, MD  Service setting: Ambulatory outpatient  Location: [facility] (AMB) Pain Management Facility  Visit type: Procedure  Specialty: Interventional Pain Management  Patient type: Established   Primary Reason for Visit: Interventional Pain Management Treatment. CC: Headache  Procedure:  Anesthesia, Analgesia, Anxiolysis:  Type: Diagnostic Supraorbital & Supratrochlear Nerve Block (Trigeminal Nerve Branches) Region: Face Level: Just above the supraorbital margin   Laterality: Right-Sided  Type: Local Anesthesia with Moderate (Conscious) Sedation Local Anesthetic: Lidocaine 1% Route: Intravenous (IV) IV Access: Secured Sedation: Meaningful verbal contact was maintained at all times during the procedure  Indication(s): Analgesia and Anxiety  Indications: 1. Atypical facial pain (Right)   2. Neuropathic postherpetic trigeminal neuralgia (Right) (V1)   3. Postherpetic neuralgia    Pain Score: Pre-procedure: 8 /10 Post-procedure: 0-No pain/10  Pre-op Assessment:  Previous date of service: 07/24/16 Service provided: Evaluation (initial) [name] is a 66 y.o. (year old), female patient, seen today for interventional treatment. She  has a past surgical history that includes Total knee arthroplasty (Right, 2014); Abdominal hysterectomy; Total knee arthroplasty (Right, 02/16/2013); Breast biopsy (Right, 2016); Breast biopsy (Right, 04/08/2016); Cholecystectomy; and Breast lumpectomy with needle localization (Right, 05/02/2016). Her primarily concern today is the Headache  Initial Vital Signs: Blood pressure 133/85, pulse 69, temperature 97.7 F (36.5 C), resp. rate 14, height 5\' 3"  (1.6 m), weight 210 lb (95.3 kg), SpO2 100 %. BMI: 37.20 kg/m  Risk Assessment: Allergies: Reviewed. She is allergic to lisinopril;  lovastatin; other; and shellfish allergy.  Allergy Precautions: No radiological contrast used Coagulopathies: "Reviewed. None identified.  Blood-thinner therapy: None at this time Active Infection(s): Reviewed. None identified. [name] is afebrile  Site Confirmation: [name] was asked to confirm the procedure and laterality before marking the site Procedure checklist: Completed Consent: Before the procedure and under the influence of no sedative(s), amnesic(s), or anxiolytics, the patient was informed of the treatment options, risks and possible complications. To fulfill our ethical and legal obligations, as recommended by the American Medical Association's Code of Ethics, I have informed the patient of my clinical impression; the nature and purpose of the treatment or procedure; the risks, benefits, and possible complications of the intervention; the alternatives, including doing nothing; the risk(s) and benefit(s) of the alternative treatment(s) or procedure(s); and the risk(s) and benefit(s) of doing nothing. The patient was provided information about the general risks and possible complications associated with the procedure. These may include, but are not limited to: failure to achieve desired goals, infection, bleeding, organ or nerve damage, allergic reactions, paralysis, and death. In addition, the patient was informed of those risks and complications associated to the procedure, such as failure to decrease pain; infection; bleeding; organ or nerve damage with subsequent damage to sensory, motor, and/or autonomic systems, resulting in permanent pain, numbness, and/or weakness of one or several areas of the body; allergic reactions; (i.e.: anaphylactic reaction); and/or death. Furthermore, the patient was informed of those risks and complications associated with the medications. These include, but are not limited to: allergic reactions (i.e.: anaphylactic or anaphylactoid reaction(s)); adrenal  axis suppression; blood sugar elevation that in diabetics may result in ketoacidosis or comma; water retention that in patients with history of congestive heart failure may result in shortness of breath, pulmonary edema, and decompensation with resultant heart failure; weight gain; swelling or edema;  medication-induced neural toxicity; particulate matter embolism and blood vessel occlusion with resultant organ, and/or nervous system infarction; and/or aseptic necrosis of one or more joints. Finally, the patient was informed that Medicine is not an exact science; therefore, there is also the possibility of unforeseen or unpredictable risks and/or possible complications that may result in a catastrophic outcome. The patient indicated having understood very clearly. We have given the patient no guarantees and we have made no promises. Enough time was given to the patient to ask questions, all of which were answered to the patient's satisfaction. [name] has indicated that she wanted to continue with the procedure. Attestation: I, the ordering provider, attest that I have discussed with the patient the benefits, risks, side-effects, alternatives, likelihood of achieving goals, and potential problems during recovery for the procedure that I have provided informed consent. Date: 08/04/2016; Time: 4:44 PM  Pre-Procedure Preparation:  Monitoring: As per clinic protocol. Respiration, ETCO2, SpO2, BP, heart rate and rhythm monitor placed and checked for adequate function Safety Precautions: Patient was assessed for positional comfort and pressure points before starting the procedure. Time-out: I initiated and conducted the "Time-out" before starting the procedure, as per protocol. The patient was asked to participate by confirming the accuracy of the "Time Out" information. Verification of the correct person, site, and procedure were performed and confirmed by me, the nursing staff, and the patient. "Time-out"  conducted as per Joint Commission's Universal Protocol (UP.01.01.01). "Time-out" Date & Time: 08/04/2016; 0956 hrs.  Description of Procedure Process:   Position: Supine Target Area: Supra-orbital foramen Approach: Anterior approach Area Prepped: Entire one side of the forehead, from, and including, the eyebrow up Prepping solution: Alcohol Prep Safety Precautions: Aspiration looking for blood return was conducted prior to all injections. At no point did we inject any substances, as a needle was being advanced. No attempts were made at seeking any paresthesias. Safe injection practices and needle disposal techniques used. Medications properly checked for expiration dates. SDV (single dose vial) medications used. Description of the Procedure: Protocol guidelines were followed. The target area was identified and the area prepped in the usual manner. Skin & deeper tissues infiltrated with local anesthetic. Appropriate amount of time allowed to pass for local anesthetics to take effect. The procedure needles were then advanced to the target area. Proper needle placement secured. Negative aspiration confirmed. Solution injected in intermittent fashion, asking for systemic symptoms every 0.5cc of injectate. The needles were then removed and the area cleansed, making sure to leave some of the prepping solution back to take advantage of its long term bactericidal properties. Vitals:   08/04/16 1006 08/04/16 1015 08/04/16 1025 08/04/16 1035  BP: 134/89 134/84 134/85 133/85  Pulse: 83 76 71 69  Resp: 15 15 15 14   Temp: 97.7 F (36.5 C)     SpO2: 99% 97% 97% 100%  Weight:      Height:        Start Time: 0957 hrs. End Time: 1003 hrs. Materials:  Needle(s) Type: Regular needle Gauge: 25G Length: 0.5-in Medication(s): We administered fentaNYL, lactated ringers, midazolam, lidocaine (PF), ropivacaine (PF) 2 mg/mL (0.2%), and triamcinolone acetonide. Please see chart orders for dosing details.  Imaging  Guidance (Non-Spinal):  Type of Imaging Technique: Fluoroscopy Guidance (Non-Spinal) Indication(s): Assistance in needle guidance and placement for procedures requiring needle placement in or near specific anatomical locations not easily accessible without such assistance. Exposure Time: Please see nurses notes. Contrast: None used. Fluoroscopic Guidance: I was personally present during the use  of fluoroscopy. "Tunnel Vision Technique" used to obtain the best possible view of the target area. Parallax error corrected before commencing the procedure. "Direction-depth-direction" technique used to introduce the needle under continuous pulsed fluoroscopy. Once target was reached, antero-posterior, oblique, and lateral fluoroscopic projection used confirm needle placement in all planes. Images permanently stored in EMR. Interpretation: No contrast injected. I personally interpreted the imaging intraoperatively. Adequate needle placement confirmed in multiple planes. Permanent images saved into the patient's record.  Antibiotic Prophylaxis:  Indication(s): None identified Antibiotic given: None  Post-operative Assessment:  EBL: None Complications: No immediate post-treatment complications observed by team, or reported by patient. Note: The patient tolerated the entire procedure well. A repeat set of vitals were taken after the procedure and the patient was kept under observation following institutional policy, for this type of procedure. Post-procedural neurological assessment was performed, showing return to baseline, prior to discharge. The patient was provided with post-procedure discharge instructions, including a section on how to identify potential problems. Should any problems arise concerning this procedure, the patient was given instructions to immediately contact us, at any time, without hesitation. In any case, we plan to contact the patient by telephone for a follow-up status report regarding  this interventional procedure. Comments:  No additional relevant information.  Plan of Care  Disposition: Discharge home  Discharge Date & Time: 08/04/2016; 1036 hrs.  Physician-requested Follow-up:  Return in about 2 weeks (around 08/18/2016) for Post-Procedure evaluation.  Future Appointments Date Time Provider [place]  09/08/2016 1:15 PM Milinda Pointer, MD [facility] None   Medications ordered for procedure: Meds ordered this encounter  Medications  . fentaNYL (SUBLIMAZE) injection 25-50 mcg    Make sure Narcan is available in the pyxis when using this medication. In the event of respiratory depression (RR< 8/min): Titrate NARCAN (naloxone) in increments of 0.1 to 0.2 mg IV at 2-3 minute intervals, until desired degree of reversal.  . lactated ringers infusion 1,000 mL  . midazolam (VERSED) 5 MG/5ML injection 1-2 mg    Make sure Flumazenil is available in the pyxis when using this medication. If oversedation occurs, administer 0.2 mg IV over 15 sec. If after 45 sec no response, administer 0.2 mg again over 1 min; may repeat at 1 min intervals; not to exceed 4 doses (1 mg)  . lidocaine (PF) (XYLOCAINE) 1 % injection 2 mL  . ropivacaine (PF) 2 mg/mL (0.2%) (NAROPIN) injection 2 mL  . triamcinolone acetonide (KENALOG-40) injection 40 mg   Medications administered: We administered fentaNYL, lactated ringers, midazolam, lidocaine (PF), ropivacaine (PF) 2 mg/mL (0.2%), and triamcinolone acetonide.  See the medical record for exact dosing, route, and time of administration.  Lab-work, Procedure(s), & Referral(s) Ordered: Orders Placed This Encounter  Procedures  . DG C-Arm 1-60 Min-No Report  . Glucose, capillary  . Discharge instructions  . Follow-up  . Informed Consent Details: Transcribe to consent form and obtain patient signature  . Provider attestation of informed consent for procedure/surgical case  . Verify informed consent   Imaging Ordered: Results for orders  placed in visit on 08/04/16  DG C-Arm 1-60 Min-No Report   Narrative Fluoroscopy was utilized by the requesting physician.  No radiographic  interpretation.    New Prescriptions   No medications on file   Primary Care Physician: Margo Common, PA Location: [facility] Note by: Kathlen Brunswick. Dossie Arbour, M.D, DABA, DABAPM, DABPM, DABIPP, FIPP Date: 08/04/2016; Time: 4:44 PM  Disclaimer:  Medicine is not an Chief Strategy Officer. The  only guarantee in medicine is that nothing is guaranteed. It is important to note that the decision to proceed with this intervention was based on the information collected from the patient. The Data and conclusions were drawn from the patient's questionnaire, the interview, and the physical examination. Because the information was provided in large part by the patient, it cannot be guaranteed that it has not been purposely or unconsciously manipulated. Every effort has been made to obtain as much relevant data as possible for this evaluation. It is important to note that the conclusions that lead to this procedure are derived in large part from the available data. Always take into account that the treatment will also be dependent on availability of resources and existing treatment guidelines, considered by other Pain Management Practitioners as being common knowledge and practice, at the time of the intervention. For Medico-Legal purposes, it is also important to point out that variation in procedural techniques and pharmacological choices are the acceptable norm. The indications, contraindications, technique, and results of the above procedure should only be interpreted and judged by a Board-Certified Interventional Pain Specialist with extensive familiarity and expertise in the same exact procedure and technique. Attempts at providing opinions without similar or greater experience and expertise than that of the treating physician will be considered as  inappropriate and unethical, and shall result in a formal complaint to the state medical board and applicable specialty societies.  Instructions provided at this appointment: Patient Instructions  Pain Management Discharge Instructions  General Discharge Instructions :  If you need to reach your doctor call: Monday-Friday 8:00 am - 4:00 pm at [phone] or toll free [phone].  After clinic hours [phone] to have operator reach doctor.  Bring all of your medication bottles to all your appointments in the pain clinic.  To cancel or reschedule your appointment with Pain Management please remember to call 24 hours in advance to avoid a fee.  Refer to the educational materials which you have been given on: General Risks, I had my Procedure. Discharge Instructions, Post Sedation.  Post Procedure Instructions:  The drugs you were given will stay in your system until tomorrow, so for the next 24 hours you should not drive, make any legal decisions or drink any alcoholic beverages.  You may eat anything you prefer, but it is better to start with liquids then soups and crackers, and gradually work up to solid foods.  Please notify your doctor immediately if you have any unusual bleeding, trouble breathing or pain that is not related to your normal pain.  Depending on the type of procedure that was done, some parts of your body may feel week and/or numb.  This usually clears up by tonight or the next day.  Walk with the use of an assistive device or accompanied by an adult for the 24 hours.  You may use ice on the affected area for the first 24 hours.  Put ice in a Ziploc bag and cover with a towel and place against area 15 minutes on 15 minutes off.  You may switch to heat after 24 hours.

## 2016-08-04 NOTE — Patient Instructions (Signed)

## 2016-08-04 NOTE — Progress Notes (Signed)
Safety precautions to be maintained throughout the outpatient stay will include: orient to surroundings, keep bed in low position, maintain call bell within reach at all times, provide assistance with transfer out of bed and ambulation.  

## 2016-08-05 ENCOUNTER — Telehealth: Payer: Self-pay | Admitting: *Deleted

## 2016-08-05 NOTE — Telephone Encounter (Signed)
Left voice mail for patient to call if needed.

## 2016-09-02 DIAGNOSIS — M1712 Unilateral primary osteoarthritis, left knee: Secondary | ICD-10-CM | POA: Diagnosis not present

## 2016-09-04 DIAGNOSIS — J40 Bronchitis, not specified as acute or chronic: Secondary | ICD-10-CM

## 2016-09-04 HISTORY — DX: Bronchitis, not specified as acute or chronic: J40

## 2016-09-05 ENCOUNTER — Ambulatory Visit
Admission: EM | Admit: 2016-09-05 | Discharge: 2016-09-05 | Disposition: A | Discharge status: Home / Self Care | Payer: Medicare Other | Attending: Family Medicine | Admitting: Family Medicine

## 2016-09-05 DIAGNOSIS — J9801 Acute bronchospasm: Secondary | ICD-10-CM | POA: Diagnosis not present

## 2016-09-05 DIAGNOSIS — R059 Cough, unspecified: Secondary | ICD-10-CM

## 2016-09-05 DIAGNOSIS — J209 Acute bronchitis, unspecified: Secondary | ICD-10-CM | POA: Diagnosis not present

## 2016-09-05 DIAGNOSIS — R05 Cough: Secondary | ICD-10-CM

## 2016-09-05 HISTORY — DX: Personal history of other infectious and parasitic diseases: Z86.19

## 2016-09-05 MED ORDER — ALBUTEROL SULFATE HFA 108 (90 BASE) MCG/ACT IN AERS: 2.0000 | INHALATION_SPRAY | RESPIRATORY_TRACT | 0 refills | Status: DC | PRN

## 2016-09-05 MED ORDER — HYDROCOD POLST-CPM POLST ER 10-8 MG/5ML PO SUER: 5.0000 mL | Freq: Two times a day (BID) | ORAL | 0 refills | Status: DC | PRN

## 2016-09-05 MED ORDER — AZITHROMYCIN 250 MG PO TABS: ORAL_TABLET | ORAL | 0 refills | Status: DC

## 2016-09-05 NOTE — ED Triage Notes (Signed)
Pt reports she is getting over a URI and all sx have resolved but can't get rid of the cough. Non productive. No fever. Denies pain

## 2016-09-05 NOTE — ED Provider Notes (Addendum)
[facility]    CSN: [id number] Arrival date & time: 09/05/16  2094     History   Chief Complaint Chief Complaint  Patient presents with  . Cough    HPI [name] is a 66 y.o. female.   She reports the last 2 weeks cough and congestion her granddaughter had the flu recently. She reports that she is used to [employer] inhaler she's about 2 months ago but is very expensive she is Robitussin-DM which has not been able to shake this cough has been sometimes productive as well. She has a history of noted no fever history hypertension neuropathy hyperlipidemia diabetes GERD. This does not smoke normal smokes around her mother had aneurysm and dementia father with heart disease and heart failure both her father and brother had diabetes. His had abdominal hysterectomy breast biopsy and cholecystectomy and knee replacement.   The history is provided by the patient. No language interpreter was used.  Cough  Cough characteristics:  Productive and non-productive Sputum characteristics:  Yellow and green Severity:  Moderate Onset quality:  Sudden Timing:  Constant Progression:  Worsening Chronicity:  New Smoker: no   Context: upper respiratory infection   Relieved by:  Cough suppressants Worsened by:  Nothing Ineffective treatments:  Beta-agonist inhaler and cough suppressants Associated symptoms: rash, rhinorrhea, shortness of breath and sore throat   Associated symptoms: no fever     Past Medical History:  Diagnosis Date  . Arthritis   . Asthma   . Diabetes mellitus without complication ([place])   . GERD (gastroesophageal reflux disease)   . History of shingles   . Hyperlipidemia   . Hypertension   . Neuralgia    Right side of head and face  . Shingles    chronic on Rt side of head and face    Patient Active Problem List   Diagnosis Date Noted  . Hypertension 07/24/2016  . Long term current use of opiate analgesic 07/24/2016  . Long term prescription opiate  use 07/24/2016  . Opiate use 07/24/2016  . Breast mass, right 04/16/2016  . Encounter for long-term (current) use of medications 01/21/2016  . Disturbance of skin sensation 01/21/2016  . Occipital headache 01/21/2016  . Bilateral occipital neuralgia 01/21/2016  . Atypical facial pain (Right) 05/07/2015  . Chronic pain syndrome 05/07/2015  . Gasserian ganglionitis 03/12/2015  . Neuropathic pain 03/12/2015  . Herpes zoster ophthalmicus 03/12/2015  . Anxiety 01/11/2015  . Diabetes ([place]) 01/11/2015  . Photopsia 01/11/2015  . Avitaminosis A 01/11/2015  . Depression 01/10/2015  . Postherpetic neuralgia 11/22/2014  . Obesity (BMI 35.0-39.9 without comorbidity) ([place]) 04/18/2014  . Neuropathic postherpetic trigeminal neuralgia (Right) (V1) 12/21/2013  . Cannot sleep 03/13/2009  . Asthma 02/07/2009  . Calcium deficiency disease 10/16/2008  . Hypercholesterolemia without hypertriglyceridemia 07/06/2008  . Avitaminosis D 07/05/2008  . History of colon polyps 10/17/2005    Past Surgical History:  Procedure Laterality Date  . ABDOMINAL HYSTERECTOMY    . BREAST BIOPSY Right 2016   benign  . BREAST BIOPSY Right 04/08/2016   path pending  . BREAST LUMPECTOMY WITH NEEDLE LOCALIZATION Right 05/02/2016   Procedure: BREAST LUMPECTOMY WITH NEEDLE LOCALIZATION;  Surgeon: Clayburn Pert, MD;  Location: [facility] ORS;  Service: General;  Laterality: Right;  . CHOLECYSTECTOMY    . TOTAL KNEE ARTHROPLASTY Right 2014  . TOTAL KNEE ARTHROPLASTY Right 02/16/2013    OB History    Gravida Para Term Preterm AB Living   2 2  SAB TAB Ectopic Multiple Live Births                   Home Medications    Prior to Admission medications   Medication Sig Start Date End Date Taking? Authorizing Provider  albuterol (PROVENTIL HFA;VENTOLIN HFA) 108 (90 Base) MCG/ACT inhaler Inhale 2 puffs into the lungs every 4 (four) hours as needed for wheezing or shortness of breath. 09/05/16   Frederich Cha, MD    amLODipine (NORVASC) 5 MG tablet Take 1 tablet (5 mg total) by mouth daily. 05/08/15   Vickki Muff Chrismon, PA  amLODipine (NORVASC) 5 MG tablet Take by mouth.    Historical Provider, MD  aspirin EC 81 MG tablet Take 81 mg by mouth daily.    Historical Provider, MD  aspirin EC 81 MG tablet Take by mouth.    Historical Provider, MD  azithromycin (ZITHROMAX Z-PAK) 250 MG tablet Take 2 tablets first day and then 1 po a day for 4 days 09/05/16   Frederich Cha, MD  BREO ELLIPTA 200-25 MCG/INH AEPB inhale 1 dose once daily Patient taking differently: 1 PUFF ORALLY DAILY 08/16/15   Vickki Muff Chrismon, PA  calcium-vitamin D (OSCAL WITH D) 500-200 MG-UNIT TABS tablet Take by mouth.    Historical Provider, MD  chlorpheniramine-HYDROcodone (TUSSIONEX PENNKINETIC ER) 10-8 MG/5ML SUER Take 5 mLs by mouth every 12 (twelve) hours as needed. 09/05/16   Frederich Cha, MD  docusate sodium (COLACE) 100 MG capsule Take 1 capsule (100 mg total) by mouth 2 (two) times daily. 05/02/16   Clayburn Pert, MD  gabapentin (NEURONTIN) 300 MG capsule Take by mouth.    Historical Provider, MD  gabapentin (NEURONTIN) 600 MG tablet Take 6 tablets by mouth daily 06/23/16   Vickki Muff Chrismon, PA  glucosamine-chondroitin 500-400 MG tablet Take 1 tablet by mouth 3 (three) times daily.    Historical Provider, MD  hydrochlorothiazide (HYDRODIURIL) 25 MG tablet Take 1 tablet (25 mg total) by mouth daily. 03/28/16   Vickki Muff Chrismon, PA  hydrochlorothiazide (MICROZIDE) 12.5 MG capsule Take by mouth.    Historical Provider, MD  HYDROcodone-acetaminophen (NORCO/VICODIN) 5-325 MG tablet take 1 tablet by mouth every 6 hours if needed for pain 05/02/16   Historical Provider, MD  meloxicam (MOBIC) 7.5 MG tablet take 1 tablet by mouth once daily with food or milk 07/17/16   Historical Provider, MD  metFORMIN (GLUCOPHAGE-XR) 500 MG 24 hr tablet Take 500 mg by mouth daily with breakfast.    Historical Provider, MD  NONFORMULARY OR COMPOUNDED ITEM Compounded  cream: 6% Gabapentin, 2.5% Lidocaine, 0.5% Meloxicam, 5% Methocarbamol, 2.5% Prilocaine. Sig: Apply 1-2 gm(s) (2-4 pumps) to affected area, 3-4 times/day. (1 pump = 0.5 gm) Dispense: 120 gm Pump Bottle. Dispenser: 1 pump = 0.5 gm. 07/24/16   Milinda Pointer, MD  Olopatadine HCl 0.7 % SOLN Apply 1 drop to eye daily. 10/25/15   Vickki Muff Chrismon, PA  Omega-3 Fatty Acids (FISH OIL PO) Take by mouth.    Historical Provider, MD  ondansetron (ZOFRAN ODT) 4 MG disintegrating tablet Take 1 tablet (4 mg total) by mouth every 8 (eight) hours as needed for nausea or vomiting. 05/02/16   Clayburn Pert, MD  pravastatin (PRAVACHOL) 20 MG tablet Take by mouth. 11/23/13   Historical Provider, MD  pregabalin (LYRICA) 75 MG capsule Take 1 capsule (75 mg total) by mouth 3 (three) times daily. Patient not taking: Reported on 08/04/2016 07/24/16   Milinda Pointer, MD  sertraline (ZOLOFT) 100 MG tablet  Take by mouth.    Historical Provider, MD  traZODone (DESYREL) 50 MG tablet Take 0.5-1 tablets (25-50 mg total) by mouth at bedtime as needed for sleep. 06/23/16   Margo Common, PA    Family History Family History  Problem Relation Age of Onset  . Anuerysm Mother     Brain  . Dementia Mother   . Heart failure Father   . CAD Father   . Dementia Father   . Diabetes Father   . Lupus Sister   . Diabetes Brother   . Throat cancer Maternal Grandfather     Social History Social History  Substance Use Topics  . Smoking status: Never Smoker  . Smokeless tobacco: Never Used  . Alcohol use 0.0 oz/week     Comment: seldom     Allergies   Lisinopril; Lovastatin; Other; and Shellfish allergy   Review of Systems Review of Systems  Constitutional: Negative for fever.  HENT: Positive for rhinorrhea and sore throat.   Respiratory: Positive for cough and shortness of breath.   Skin: Positive for rash.  All other systems reviewed and are negative.    Physical Exam Triage Vital Signs ED Triage Vitals    Enc Vitals Group     BP 09/05/16 0951 130/85     Pulse Rate 09/05/16 0951 73     Resp 09/05/16 0951 18     Temp 09/05/16 0951 97.9 F (36.6 C)     Temp Source 09/05/16 0951 Oral     SpO2 09/05/16 0951 98 %     Weight 09/05/16 0952 220 lb (99.8 kg)     Height 09/05/16 0952 5\' 3"  (1.6 m)     Head Circumference --      Peak Flow --      Pain Score --      Pain Loc --      Pain Edu? --      Excl. in [place]? --    No data found.   Updated Vital Signs BP 130/85 (BP Location: Right Arm)   Pulse 73   Temp 97.9 F (36.6 C) (Oral)   Resp 18   Ht 5\' 3"  (1.6 m)   Wt 220 lb (99.8 kg)   SpO2 98%   BMI 38.97 kg/m   Visual Acuity Right Eye Distance:   Left Eye Distance:   Bilateral Distance:    Right Eye Near:   Left Eye Near:    Bilateral Near:     Physical Exam  Constitutional: She is oriented to person, place, and time. She appears well-developed and well-nourished.  HENT:  Head: Normocephalic and atraumatic.  Right Ear: Hearing, tympanic membrane, external ear and ear canal normal.  Left Ear: Hearing, tympanic membrane, external ear and ear canal normal.  Nose: Mucosal edema present. Right sinus exhibits no maxillary sinus tenderness. Left sinus exhibits no maxillary sinus tenderness.  Mouth/Throat: Uvula is midline and oropharynx is clear and moist.  Patient with right frontal neuropathy tenderness  Eyes: Pupils are equal, round, and reactive to light.  Neck: Normal range of motion. Neck supple. No thyromegaly present.  Cardiovascular: Normal rate and regular rhythm.   Pulmonary/Chest: Breath sounds normal.  Musculoskeletal: Normal range of motion.  Lymphadenopathy:    She has cervical adenopathy.  Neurological: She is alert and oriented to person, place, and time.  Skin: Skin is warm and dry.  Psychiatric: She has a normal mood and affect.  Vitals reviewed.    UC Treatments / Results  Labs (all labs ordered are listed, but only abnormal results are  displayed) Labs Reviewed - No data to display  EKG  EKG Interpretation None       Radiology No results found.  Procedures Procedures (including critical care time)  Medications Ordered in UC Medications - No data to display   Initial Impression / Assessment and Plan / UC Course  I have reviewed the triage vital signs and the nursing notes.  Pertinent labs & imaging results that were available during my care of the patient were reviewed by me and considered in my medical decision making (see chart for details).   will place her on a Z-Pak and Tussionex 1 teaspoon twice a day she declined prednisone prescription at this time but except albuterol inhaler system reveal prescriptions states to be very expensive follow-up with PCP if not better in 1-2 weeks.   Final Clinical Impressions(s) / UC Diagnoses   Final diagnoses:  Cough  Acute bronchitis with bronchospasm    New Prescriptions New Prescriptions   ALBUTEROL (PROVENTIL HFA;VENTOLIN HFA) 108 (90 BASE) MCG/ACT INHALER    Inhale 2 puffs into the lungs every 4 (four) hours as needed for wheezing or shortness of breath.   AZITHROMYCIN (ZITHROMAX Z-PAK) 250 MG TABLET    Take 2 tablets first day and then 1 po a day for 4 days   CHLORPHENIRAMINE-HYDROCODONE (TUSSIONEX PENNKINETIC ER) 10-8 MG/5ML SUER    Take 5 mLs by mouth every 12 (twelve) hours as needed.    Note: This dictation was prepared with Dragon dictation along with smaller phrase technology. Any transcriptional errors that result from this process are unintentional.   Frederich Cha, MD 09/05/16 [place], MD 09/05/16 1059

## 2016-09-07 DIAGNOSIS — M1712 Unilateral primary osteoarthritis, left knee: Secondary | ICD-10-CM | POA: Insufficient documentation

## 2016-09-08 ENCOUNTER — Ambulatory Visit: Payer: Medicare Other | Attending: Pain Medicine | Admitting: Pain Medicine

## 2016-09-08 ENCOUNTER — Encounter: Payer: Self-pay | Admitting: Pain Medicine

## 2016-09-08 VITALS — BP 137/77 | HR 86 | Temp 98.4°F | Resp 16 | Ht 63.0 in | Wt 218.0 lb

## 2016-09-08 DIAGNOSIS — M1712 Unilateral primary osteoarthritis, left knee: Secondary | ICD-10-CM | POA: Diagnosis not present

## 2016-09-08 DIAGNOSIS — G894 Chronic pain syndrome: Secondary | ICD-10-CM | POA: Diagnosis not present

## 2016-09-08 DIAGNOSIS — F419 Anxiety disorder, unspecified: Secondary | ICD-10-CM | POA: Diagnosis not present

## 2016-09-08 DIAGNOSIS — B0230 Zoster ocular disease, unspecified: Secondary | ICD-10-CM | POA: Diagnosis not present

## 2016-09-08 DIAGNOSIS — F329 Major depressive disorder, single episode, unspecified: Secondary | ICD-10-CM | POA: Insufficient documentation

## 2016-09-08 DIAGNOSIS — Z7982 Long term (current) use of aspirin: Secondary | ICD-10-CM | POA: Diagnosis not present

## 2016-09-08 DIAGNOSIS — I10 Essential (primary) hypertension: Secondary | ICD-10-CM | POA: Insufficient documentation

## 2016-09-08 DIAGNOSIS — Z91013 Allergy to seafood: Secondary | ICD-10-CM | POA: Diagnosis not present

## 2016-09-08 DIAGNOSIS — J45909 Unspecified asthma, uncomplicated: Secondary | ICD-10-CM | POA: Diagnosis not present

## 2016-09-08 DIAGNOSIS — Z90710 Acquired absence of both cervix and uterus: Secondary | ICD-10-CM | POA: Diagnosis not present

## 2016-09-08 DIAGNOSIS — Z888 Allergy status to other drugs, medicaments and biological substances status: Secondary | ICD-10-CM | POA: Insufficient documentation

## 2016-09-08 DIAGNOSIS — Z9889 Other specified postprocedural states: Secondary | ICD-10-CM | POA: Insufficient documentation

## 2016-09-08 DIAGNOSIS — Z9049 Acquired absence of other specified parts of digestive tract: Secondary | ICD-10-CM | POA: Diagnosis not present

## 2016-09-08 DIAGNOSIS — N6310 Unspecified lump in the right breast, unspecified quadrant: Secondary | ICD-10-CM | POA: Insufficient documentation

## 2016-09-08 DIAGNOSIS — Z79899 Other long term (current) drug therapy: Secondary | ICD-10-CM | POA: Diagnosis not present

## 2016-09-08 DIAGNOSIS — B0229 Other postherpetic nervous system involvement: Secondary | ICD-10-CM | POA: Insufficient documentation

## 2016-09-08 DIAGNOSIS — K219 Gastro-esophageal reflux disease without esophagitis: Secondary | ICD-10-CM | POA: Diagnosis not present

## 2016-09-08 DIAGNOSIS — Z96651 Presence of right artificial knee joint: Secondary | ICD-10-CM | POA: Insufficient documentation

## 2016-09-08 DIAGNOSIS — B0222 Postherpetic trigeminal neuralgia: Secondary | ICD-10-CM | POA: Insufficient documentation

## 2016-09-08 DIAGNOSIS — E669 Obesity, unspecified: Secondary | ICD-10-CM | POA: Diagnosis not present

## 2016-09-08 DIAGNOSIS — M792 Neuralgia and neuritis, unspecified: Secondary | ICD-10-CM | POA: Diagnosis not present

## 2016-09-08 DIAGNOSIS — G501 Atypical facial pain: Secondary | ICD-10-CM

## 2016-09-08 DIAGNOSIS — E119 Type 2 diabetes mellitus without complications: Secondary | ICD-10-CM | POA: Diagnosis not present

## 2016-09-08 DIAGNOSIS — Z86010 Personal history of colonic polyps: Secondary | ICD-10-CM | POA: Insufficient documentation

## 2016-09-08 DIAGNOSIS — E7800 Pure hypercholesterolemia, unspecified: Secondary | ICD-10-CM | POA: Diagnosis not present

## 2016-09-08 MED ORDER — PREDNISONE 20 MG PO TABS: ORAL_TABLET | ORAL | 0 refills | Status: AC

## 2016-09-08 NOTE — Progress Notes (Signed)
Patient's Name: [name]  MRN: [id number]  Referring Provider: Margo [name], [place]  DOB: March 17, 1951  PCP: Sarah Common, PA  DOS: 09/08/2016  Note by: [name]. Sarah Arbour, MD  Service setting: Ambulatory outpatient  Specialty: Interventional Pain Management  Location: [facility] (AMB) Pain Management Facility    Patient type: Established   Primary Reason(s) for Visit: Encounter for post-procedure evaluation of chronic illness with mild to moderate exacerbation CC: Headache (post herpetic pain around right eye)  HPI  [name] is a 66 y.o. year old, female patient, who comes today for a post-procedure evaluation. She has Postherpetic neuralgia; Depression; Obesity (BMI 35.0-39.9 without comorbidity) ([place]); Neuropathic postherpetic trigeminal neuralgia (Location of Primary Source of Pain) (Right) (V1); Anxiety; Asthma; Diabetes ([place]); Photopsia; History of colon polyps; Calcium deficiency disease; Cannot sleep; Hypercholesterolemia without hypertriglyceridemia; Avitaminosis A; Avitaminosis D; Gasserian ganglionitis; Neuropathic pain; Chronic Atypical facial pain (Location of Primary Source of Pain) (Right); Chronic pain syndrome; Encounter for long-term (current) use of medications; Disturbance of skin sensation; Occipital headache; Bilateral occipital neuralgia; Breast mass, right; Herpes zoster ophthalmicus; Hypertension; Long term current use of opiate analgesic; Long term prescription opiate use; Opiate use; and Primary osteoarthritis of left knee on her problem list. Her primarily concern today is the Headache (post herpetic pain around right eye)  Pain Assessment: Self-Reported Pain Score: 8 /10 Clinically the patient looks like a 3/10 Reported level is inconsistent with clinical observations. Information on the proper use of the pain scale provided to the patient today Pain Type: Chronic pain Pain Location: Head Pain Orientation: Right Pain Descriptors / Indicators: Aching, Tender Pain  Frequency: Constant  [name] comes in today for post-procedure evaluation after the treatment done on 08/04/2016. Results of the diagnostic right supraorbital and supratrochlear nerve blocks would suggest that there is a direct involvement of those 2 terminal branches in the patient's pain production. The patient presents with clear evidence of allodynia and hyperalgesia, clearly neuropathic in origin. She continues to point out how much benefit she gets from the creams that we give her. Unfortunately the diagnostic nerve blocks did not provide her with any long-term benefit. Because of this, today we have gone over some of the options. One of them would be to go in and do a gasserian ganglion steroid injection. However radiofrequency of that region is technically difficult and risky. Even with a successful radiofrequency of the V1 (ophthalmic branch), there is a risk of permanent ocular problems. Because of this, I'm not convinced that this would be the best option. Another possibility would be to do a radiofrequency of the terminal branches (right supraorbital and supratrochlear nerves). However, radiofrequency of peripheral nerves has a higher incidence of neuritis compared to deeper nerves. The patient was informed of this. In reviewing all of this options, it has come to my attention that the patient does not believe that she has done a course of oral steroids to address the possibility of an inflammatory ganglionitis. She does have a history of diabetes, but she indicates that she controls this with diet and she has not needed to take even medications for it. Today I have given her a taper of prednisone for 9 days and I have provided her with the appropriate warnings regarding the oral use of this medications including the increases in her blood sugar. The patient was instructed to closely monitor her blood sugar and if necessary, take medication for those increases. She indicates having some metformin at  home and the ability  to test her blood glucose levels. I will see her back in 2 weeks to see how she is doing. I have also encouraged her to think about the options provided today including the risk and possible complications of that she can give [place] an idea of what she would like to do next.  Further details on both, my assessment(s), as well as the proposed treatment plan, please see below.  Post-Procedure Assessment  08/04/2016 Procedure: Diagnostic right supraorbital & Supratrochlear Nerve Block, under fluoroscopic guidance and IV sedation. Pre-procedure pain score:  8/10 Post-procedure pain score: 0/10 (100% relief) Influential Factors: BMI: 38.62 kg/m Intra-procedural challenges: None observed Assessment challenges: None detected         Post-procedural side-effects, adverse reactions, or complications: None reported Reported issues: None  Sedation: Sedation provided. When no sedatives are used, the analgesic levels obtained are directly associated to the effectiveness of the local anesthetics. However, when sedation is provided, the level of analgesia obtained during the initial 1 hour following the intervention, is believed to be the result of a combination of factors. These factors may include, but are not limited to: 1. The effectiveness of the local anesthetics used. 2. The effects of the analgesic(s) and/or anxiolytic(s) used. 3. The degree of discomfort experienced by the patient at the time of the procedure. 4. The patients ability and reliability in recalling and recording the events. 5. The presence and influence of possible secondary gains and/or psychosocial factors. Reported result: Relief experienced during the 1st hour after the procedure: 100 % (Ultra-Short Term Relief) Interpretative annotation: Analgesia during this period is likely to be Local Anesthetic and/or IV Sedative (Analgesic/Anxiolitic) related.          Effects of local anesthetic: The analgesic effects  attained during this period are directly associated to the localized infiltration of local anesthetics and therefore cary significant diagnostic value as to the etiological location, or anatomical origin, of the pain. Expected duration of relief is directly dependent on the pharmacodynamics of the local anesthetic used. Long-acting (4-6 hours) anesthetics used.  Reported result: Relief during the next 4 to 6 hour after the procedure: 100 % (stayed numb for approx 3 hours) (Short-Term Relief) Interpretative annotation: Complete relief would suggest area to be the source of the pain.          Long-term benefit: Defined as the period of time past the expected duration of local anesthetics. With the possible exception of prolonged sympathetic blockade from the local anesthetics, benefits during this period are typically attributed to, or associated with, other factors such as analgesic sensory neuropraxia, antiinflammatory effects, or beneficial biochemical changes provided by agents other than the local anesthetics Reported result: Extended relief following procedure: 0 % (Long-Term Relief) Interpretative annotation: No long-term benefit. This could suggest limited inflammatory component to the pain with possible mechanical aggravating factors.          Current benefits: Defined as persistent relief that continues at this point in time.   Reported results: Treated area: 0 %       Interpretative annotation: Recurrance of symptoms. This would suggest persistent aggravating factors  Interpretation: Results would suggest a successful diagnostic intervention.          Laboratory Chemistry  Inflammation Markers Lab Results  Component Value Date   CRP 1.4 (H) 07/25/2016   ESRSEDRATE 13 07/25/2016   (CRP: Acute Phase) (ESR: Chronic Phase) Renal Function Markers Lab Results  Component Value Date   BUN 17 07/25/2016   CREATININE 0.82 07/25/2016  GFRAA >60 07/25/2016   GFRNONAA >60 07/25/2016    Hepatic Function Markers Lab Results  Component Value Date   AST 14 (L) 07/25/2016   ALT 14 07/25/2016   ALBUMIN 3.7 07/25/2016   ALKPHOS 85 07/25/2016   Electrolytes Lab Results  Component Value Date   NA 137 07/25/2016   K 3.4 (L) 07/25/2016   CL 100 (L) 07/25/2016   CALCIUM 8.8 (L) 07/25/2016   MG 2.0 07/25/2016   Neuropathy Markers Lab Results  Component Value Date   VITAMINB12 375 07/25/2016   Bone Pathology Markers Lab Results  Component Value Date   ALKPHOS 85 07/25/2016   25OHVITD1 32 07/25/2016   25OHVITD2 5.1 07/25/2016   25OHVITD3 27 07/25/2016   CALCIUM 8.8 (L) 07/25/2016   Coagulation Parameters Lab Results  Component Value Date   INR 1.0 01/31/2013   LABPROT 13.2 01/31/2013   APTT 27.8 01/31/2013   PLT 291 03/28/2016   Cardiovascular Markers Lab Results  Component Value Date   HGB 15.0 05/02/2016   HCT 44.0 05/02/2016   Note: Lab results reviewed.  Recent Diagnostic Imaging Review  No results found. Note: Imaging results reviewed.          Meds  The patient has a current medication list which includes the following prescription(s): albuterol, amlodipine, aspirin ec, azithromycin, chlorpheniramine-hydrocodone, gabapentin, hydrochlorothiazide, hydrocodone-acetaminophen, NONFORMULARY OR COMPOUNDED ITEM, olopatadine hcl, prednisone, and trazodone.  Current Outpatient Prescriptions on File Prior to Visit  Medication Sig  . albuterol (PROVENTIL HFA;VENTOLIN HFA) 108 (90 Base) MCG/ACT inhaler Inhale 2 puffs into the lungs every 4 (four) hours as needed for wheezing or shortness of breath.  Marland Kitchen amLODipine (NORVASC) 5 MG tablet Take 1 tablet (5 mg total) by mouth daily.  Marland Kitchen aspirin EC 81 MG tablet Take 81 mg by mouth daily.  Marland Kitchen azithromycin (ZITHROMAX Z-PAK) 250 MG tablet Take 2 tablets first day and then 1 po a day for 4 days  . chlorpheniramine-HYDROcodone (TUSSIONEX PENNKINETIC ER) 10-8 MG/5ML SUER Take 5 mLs by mouth every 12 (twelve) hours as  needed.  . gabapentin (NEURONTIN) 600 MG tablet Take 6 tablets by mouth daily  . hydrochlorothiazide (HYDRODIURIL) 25 MG tablet Take 1 tablet (25 mg total) by mouth daily.  Marland Kitchen HYDROcodone-acetaminophen (NORCO/VICODIN) 5-325 MG tablet take 1 tablet by mouth every 6 hours if needed for pain  . NONFORMULARY OR COMPOUNDED ITEM Compounded cream: 6% Gabapentin, 2.5% Lidocaine, 0.5% Meloxicam, 5% Methocarbamol, 2.5% Prilocaine. Sig: Apply 1-2 gm(s) (2-4 pumps) to affected area, 3-4 times/day. (1 pump = 0.5 gm) Dispense: 120 gm Pump Bottle. Dispenser: 1 pump = 0.5 gm.  . Olopatadine HCl 0.7 % SOLN Apply 1 drop to eye daily.  . traZODone (DESYREL) 50 MG tablet Take 0.5-1 tablets (25-50 mg total) by mouth at bedtime as needed for sleep.   No current facility-administered medications on file prior to visit.    ROS  Constitutional: Denies any fever or chills Gastrointestinal: No reported hemesis, hematochezia, vomiting, or acute GI distress Musculoskeletal: Denies any acute onset joint swelling, redness, loss of ROM, or weakness Neurological: No reported episodes of acute onset apraxia, aphasia, dysarthria, agnosia, amnesia, paralysis, loss of coordination, or loss of consciousness  Allergies  Ms. Hemmelgarn is allergic to lisinopril; lovastatin; other; and shellfish allergy.  PFSH  Drug: Ms. Stankiewicz  reports that she does not use drugs. Alcohol:  reports that she drinks alcohol. Tobacco:  reports that she has never smoked. She has never used smokeless tobacco. Medical:  has a past medical  history of Arthritis; Asthma; Bronchitis (09/04/2016); Diabetes mellitus without complication ([place]); GERD (gastroesophageal reflux disease); History of shingles; Hyperlipidemia; Hypertension; Neuralgia; and Shingles. Family: family history includes Anuerysm in her mother; CAD in her father; Dementia in her father and mother; Diabetes in her brother and father; Heart failure in her father; Lupus in her sister; Throat  cancer in her maternal grandfather.  Past Surgical History:  Procedure Laterality Date  . ABDOMINAL HYSTERECTOMY    . BREAST BIOPSY Right 2016   benign  . BREAST BIOPSY Right 04/08/2016   path pending  . BREAST LUMPECTOMY WITH NEEDLE LOCALIZATION Right 05/02/2016   Procedure: BREAST LUMPECTOMY WITH NEEDLE LOCALIZATION;  Surgeon: Clayburn Pert, MD;  Location: [facility] ORS;  Service: General;  Laterality: Right;  . CHOLECYSTECTOMY    . TOTAL KNEE ARTHROPLASTY Right 2014  . TOTAL KNEE ARTHROPLASTY Right 02/16/2013   Constitutional Exam  General appearance: Well nourished, well developed, and well hydrated. In no apparent acute distress Vitals:   09/08/16 1253  BP: 137/77  Pulse: 86  Resp: 16  Temp: 98.4 F (36.9 C)  TempSrc: Oral  SpO2: 98%  Weight: 218 lb (98.9 kg)  Height: _0  (1.6 m)   BMI Assessment: Estimated body mass index is 38.62 kg/m as calculated from the following:   Height as of this encounter: _1  (1.6 m).   Weight as of this encounter: 218 lb (98.9 kg).  BMI interpretation table: BMI level Category Range association with higher incidence of chronic pain  <18 kg/m2 Underweight   18.5-24.9 kg/m2 Ideal body weight   25-29.9 kg/m2 Overweight Increased incidence by 20%  30-34.9 kg/m2 Obese (Class I) Increased incidence by 68%  35-39.9 kg/m2 Severe obesity (Class II) Increased incidence by 136%  >40 kg/m2 Extreme obesity (Class III) Increased incidence by 254%   BMI Readings from Last 4 Encounters:  09/08/16 38.62 kg/m  09/05/16 38.97 kg/m  08/04/16 37.20 kg/m  07/24/16 39.68 kg/m   Wt Readings from Last 4 Encounters:  09/08/16 218 lb (98.9 kg)  09/05/16 220 lb (99.8 kg)  08/04/16 210 lb (95.3 kg)  07/24/16 224 lb (101.6 kg)  Psych/Mental status: Alert, oriented x 3 (person, place, & time)       Eyes: PERLA Respiratory: No evidence of acute respiratory distress  Cervical Spine Exam  Inspection: No masses, redness, or swelling Alignment:  Symmetrical Functional ROM: Unrestricted ROM Stability: No instability detected Muscle strength & Tone: Functionally intact Sensory: Unimpaired Palpation: No palpable anomalies  Upper Extremity (UE) Exam    Side: Right upper extremity  Side: Left upper extremity  Inspection: No masses, redness, swelling, or asymmetry. No contractures  Inspection: No masses, redness, swelling, or asymmetry. No contractures  Functional ROM: Unrestricted ROM          Functional ROM: Unrestricted ROM          Muscle strength & Tone: Functionally intact  Muscle strength & Tone: Functionally intact  Sensory: Unimpaired  Sensory: Unimpaired  Palpation: No palpable anomalies  Palpation: No palpable anomalies  Specialized Test(s): Deferred         Specialized Test(s): Deferred          Thoracic Spine Exam  Inspection: No masses, redness, or swelling Alignment: Symmetrical Functional ROM: Unrestricted ROM Stability: No instability detected Sensory: Unimpaired Muscle strength & Tone: No palpable anomalies  Lumbar Spine Exam  Inspection: No masses, redness, or swelling Alignment: Symmetrical Functional ROM: Unrestricted ROM Stability: No instability detected Muscle strength & Tone: Functionally intact Sensory: Unimpaired  Palpation: No palpable anomalies Provocative Tests: Lumbar Hyperextension and rotation test: evaluation deferred today       Patrick's Maneuver: evaluation deferred today              Gait & Posture Assessment  Ambulation: Unassisted Gait: Relatively normal for age and body habitus Posture: WNL   Lower Extremity Exam    Side: Right lower extremity  Side: Left lower extremity  Inspection: No masses, redness, swelling, or asymmetry. No contractures  Inspection: No masses, redness, swelling, or asymmetry. No contractures  Functional ROM: Unrestricted ROM          Functional ROM: Unrestricted ROM          Muscle strength & Tone: Functionally intact  Muscle strength & Tone: Functionally  intact  Sensory: Unimpaired  Sensory: Unimpaired  Palpation: No palpable anomalies  Palpation: No palpable anomalies   Assessment  Primary Diagnosis & Pertinent Problem List: The primary encounter diagnosis was Chronic Atypical facial pain (Location of Primary Source of Pain) (Right). Diagnoses of Neuropathic postherpetic trigeminal neuralgia (Location of Primary Source of Pain) (Right) (V1) and Neuropathic pain were also pertinent to this visit.  Status Diagnosis  Controlled Controlled Controlled 1. Chronic Atypical facial pain (Location of Primary Source of Pain) (Right)   2. Neuropathic postherpetic trigeminal neuralgia (Location of Primary Source of Pain) (Right) (V1)   3. Neuropathic pain      Plan of Care  Pharmacotherapy (Medications Ordered): Meds ordered this encounter  Medications  . predniSONE (DELTASONE) 20 MG tablet    Sig: Take 3 tab(s) in the morning x 3 days, then 2 tab(s) x 3 days, followed by 1 tab x 3 days.    Dispense:  21 tablet    Refill:  0    Do not add to the "Automatic Refill" notification system.   New Prescriptions   PREDNISONE (DELTASONE) 20 MG TABLET    Take 3 tab(s) in the morning x 3 days, then 2 tab(s) x 3 days, followed by 1 tab x 3 days.   Medications administered today: Ms. Winkel had no medications administered during this visit. Lab-work, procedure(s), and/or referral(s): No orders of the defined types were placed in this encounter.  Imaging and/or referral(s): None  Interventional therapies: Planned, scheduled, and/or pending:   Not at this time. 9 day oral prednisone taper.   Considering:   Diagnostic Right Supraorbital & Supratrochlear Nerve Block (Trigeminal Nerve Branches) Possible right supraorbital and supratrochlear RFA Diagnostic Right Trigeminal Nerve (V1) Block, under fluoro and IV sedation.   Palliative PRN treatment(s):   Diagnostic Right Supraorbital & Supratrochlear Nerve Block (Trigeminal Nerve Branches)    Provider-requested follow-up: Return in about 2 weeks (around 09/22/2016) for After steroid taper..  Future Appointments Date Time Provider [place]  10/06/2016 1:30 PM Milinda Pointer, MD [facility] None   Primary Care Physician: Sarah Common, PA Location: [facility] Facility Note by: [name]. [name], M.D, DABA, DABAPM, DABPM, DABIPP, FIPP Date: 09/08/2016; Time: 3:12 PM  Pain Score Disclaimer: We use the NRS-11 scale. This is a self-reported, subjective measurement of pain severity with only modest accuracy. It is used primarily to identify changes within a particular patient. It must be understood that outpatient pain scales are significantly less accurate that those used for research, where they can be applied under ideal controlled circumstances with minimal exposure to variables. In reality, the score is likely to be a combination of pain intensity and pain affect, where pain affect describes the degree  of emotional arousal or changes in action readiness caused by the sensory experience of pain. Factors such as social and work situation, setting, emotional state, anxiety levels, expectation, and prior pain experience may influence pain perception and show large inter-individual differences that may also be affected by time variables.  Patient instructions provided during this appointment: Patient Instructions   Pain Score  Introduction: The pain score used by this practice is the Verbal Numerical Rating Scale (VNRS-11). This is an 11-point scale. It is for adults and children 10 years or older. There are significant differences in how the pain score is reported, used, and applied. Forget everything you learned in the past and learn this scoring system.  General Information: The scale should reflect your current level of pain. Unless you are specifically asked for the level of your worst pain, or your average pain. If you are asked for one of these  two, then it should be understood that it is over the past 24 hours.  Basic Activities of Daily Living (ADL): Personal hygiene, dressing, eating, transferring, and using restroom.  Instructions: Most patients tend to report their level of pain as a combination of two factors, their physical pain and their psychosocial pain. This last one is also known as "suffering" and it is reflection of how physical pain affects you socially and psychologically. From now on, report them separately. From this point on, when asked to report your pain level, report only your physical pain. Use the following table for reference.  Pain Clinic Pain Levels (0-5/10)  Pain Level Score Description  No Pain 0   Mild pain 1 Nagging, annoying, but does not interfere with basic activities of daily living (ADL). Patients are able to eat, bathe, get dressed, toileting (being able to get on and off the toilet and perform personal hygiene functions), transfer (move in and out of bed or a chair without assistance), and maintain continence (able to control bladder and bowel functions). Blood pressure and heart rate are unaffected. A normal heart rate for a healthy adult ranges from 60 to 100 bpm (beats per minute).   Mild to moderate pain 2 Noticeable and distracting. Impossible to hide from other people. More frequent flare-ups. Still possible to adapt and function close to normal. It can be very annoying and may have occasional stronger flare-ups. With discipline, patients may get used to it and adapt.   Moderate pain 3 Interferes significantly with activities of daily living (ADL). It becomes difficult to feed, bathe, get dressed, get on and off the toilet or to perform personal hygiene functions. Difficult to get in and out of bed or a chair without assistance. Very distracting. With effort, it can be ignored when deeply involved in activities.   Moderately severe pain 4 Impossible to ignore for more than a few minutes. With  effort, patients may still be able to manage work or participate in some social activities. Very difficult to concentrate. Signs of autonomic nervous system discharge are evident: dilated pupils (mydriasis); mild sweating (diaphoresis); sleep interference. Heart rate becomes elevated (>115 bpm). Diastolic blood pressure (lower number) rises above 100 mmHg. Patients find relief in laying down and not moving.   Severe pain 5 Intense and extremely unpleasant. Associated with frowning face and frequent crying. Pain overwhelms the senses.  Ability to do any activity or maintain social relationships becomes significantly limited. Conversation becomes difficult. Pacing back and forth is [name], as getting into a comfortable position is nearly impossible. Pain wakes you up from  deep sleep. Physical signs will be obvious: pupillary dilation; increased sweating; goosebumps; brisk reflexes; cold, clammy hands and feet; nausea, vomiting or dry heaves; loss of appetite; significant sleep disturbance with inability to fall asleep or to remain asleep. When persistent, significant weight loss is observed due to the complete loss of appetite and sleep deprivation.  Blood pressure and heart rate becomes significantly elevated. Caution: If elevated blood pressure triggers a pounding headache associated with blurred vision, then the patient should immediately seek attention at an urgent or emergency care unit, as these may be signs of an impending stroke.    Emergency Department Pain Levels (6-10/10)  Emergency Room Pain 6 Severely limiting. Requires emergency care and should not be seen or managed at an outpatient pain management facility. Communication becomes difficult and requires great effort. Assistance to reach the emergency department may be required. Facial flushing and profuse sweating along with potentially dangerous increases in heart rate and blood pressure will be evident.   Distressing pain 7 Self-care is very  difficult. Assistance is required to transport, or use restroom. Assistance to reach the emergency department will be required. Tasks requiring coordination, such as bathing and getting dressed become very difficult.   Disabling pain 8 Self-care is no longer possible. At this level, pain is disabling. The individual is unable to do even the most "basic" activities such as walking, eating, bathing, dressing, transferring to a bed, or toileting. Fine motor skills are lost. It is difficult to think clearly.   Incapacitating pain 9 Pain becomes incapacitating. Thought processing is no longer possible. Difficult to remember your own name. Control of movement and coordination are lost.   The worst pain imaginable 10 At this level, most patients pass out from pain. When this level is reached, collapse of the autonomic nervous system occurs, leading to a sudden drop in blood pressure and heart rate. This in turn results in a temporary and dramatic drop in blood flow to the brain, leading to a loss of consciousness. Fainting is one of the body's self defense mechanisms. Passing out puts the brain in a calmed state and causes it to shut down for a while, in order to begin the healing process.    Summary: 1. Refer to this scale when providing [place] with your pain level. 2. Be accurate and careful when reporting your pain level. This will help with your care. 3. Over-reporting your pain level will lead to loss of credibility. 4. Even a level of 1/10 means that there is pain and will be treated at our facility. 5. High, inaccurate reporting will be documented as "Symptom Exaggeration", leading to loss of credibility and suspicions of possible secondary gains such as obtaining more narcotics, or wanting to appear disabled, for fraudulent reasons. 6. Only pain levels of 5 or below will be seen at our facility. 7. Pain levels of 6 and above will be sent to the Emergency Department and the appointment cancelled.  A  prescription for Medrol Dosepack was sent to your pharmacy. _____________________________________________________________________________________________

## 2016-09-08 NOTE — Progress Notes (Signed)
Safety precautions to be maintained throughout the outpatient stay will include: orient to surroundings, keep bed in low position, maintain call bell within reach at all times, provide assistance with transfer out of bed and ambulation.  

## 2016-09-08 NOTE — Patient Instructions (Addendum)
Pain Score  Introduction: The pain score used by this practice is the Verbal Numerical Rating Scale (VNRS-11). This is an 11-point scale. It is for adults and children 10 years or older. There are significant differences in how the pain score is reported, used, and applied. Forget everything you learned in the past and learn this scoring system.  General Information: The scale should reflect your current level of pain. Unless you are specifically asked for the level of your worst pain, or your average pain. If you are asked for one of these two, then it should be understood that it is over the past 24 hours.  Basic Activities of Daily Living (ADL): Personal hygiene, dressing, eating, transferring, and using restroom.  Instructions: Most patients tend to report their level of pain as a combination of two factors, their physical pain and their psychosocial pain. This last one is also known as "suffering" and it is reflection of how physical pain affects you socially and psychologically. From now on, report them separately. From this point on, when asked to report your pain level, report only your physical pain. Use the following table for reference.  Pain Clinic Pain Levels (0-5/10)  Pain Level Score Description  No Pain 0   Mild pain 1 Nagging, annoying, but does not interfere with basic activities of daily living (ADL). Patients are able to eat, bathe, get dressed, toileting (being able to get on and off the toilet and perform personal hygiene functions), transfer (move in and out of bed or a chair without assistance), and maintain continence (able to control bladder and bowel functions). Blood pressure and heart rate are unaffected. A normal heart rate for a healthy adult ranges from 60 to 100 bpm (beats per minute).   Mild to moderate pain 2 Noticeable and distracting. Impossible to hide from other people. More frequent flare-ups. Still possible to adapt and function close to normal. It can be very  annoying and may have occasional stronger flare-ups. With discipline, patients may get used to it and adapt.   Moderate pain 3 Interferes significantly with activities of daily living (ADL). It becomes difficult to feed, bathe, get dressed, get on and off the toilet or to perform personal hygiene functions. Difficult to get in and out of bed or a chair without assistance. Very distracting. With effort, it can be ignored when deeply involved in activities.   Moderately severe pain 4 Impossible to ignore for more than a few minutes. With effort, patients may still be able to manage work or participate in some social activities. Very difficult to concentrate. Signs of autonomic nervous system discharge are evident: dilated pupils (mydriasis); mild sweating (diaphoresis); sleep interference. Heart rate becomes elevated (>115 bpm). Diastolic blood pressure (lower number) rises above 100 mmHg. Patients find relief in laying down and not moving.   Severe pain 5 Intense and extremely unpleasant. Associated with frowning face and frequent crying. Pain overwhelms the senses.  Ability to do any activity or maintain social relationships becomes significantly limited. Conversation becomes difficult. Pacing back and forth is common, as getting into a comfortable position is nearly impossible. Pain wakes you up from deep sleep. Physical signs will be obvious: pupillary dilation; increased sweating; goosebumps; brisk reflexes; cold, clammy hands and feet; nausea, vomiting or dry heaves; loss of appetite; significant sleep disturbance with inability to fall asleep or to remain asleep. When persistent, significant weight loss is observed due to the complete loss of appetite and sleep deprivation.  Blood pressure and heart   rate becomes significantly elevated. Caution: If elevated blood pressure triggers a pounding headache associated with blurred vision, then the patient should immediately seek attention at an urgent or  emergency care unit, as these may be signs of an impending stroke.    Emergency Department Pain Levels (6-10/10)  Emergency Room Pain 6 Severely limiting. Requires emergency care and should not be seen or managed at an outpatient pain management facility. Communication becomes difficult and requires great effort. Assistance to reach the emergency department may be required. Facial flushing and profuse sweating along with potentially dangerous increases in heart rate and blood pressure will be evident.   Distressing pain 7 Self-care is very difficult. Assistance is required to transport, or use restroom. Assistance to reach the emergency department will be required. Tasks requiring coordination, such as bathing and getting dressed become very difficult.   Disabling pain 8 Self-care is no longer possible. At this level, pain is disabling. The individual is unable to do even the most "basic" activities such as walking, eating, bathing, dressing, transferring to a bed, or toileting. Fine motor skills are lost. It is difficult to think clearly.   Incapacitating pain 9 Pain becomes incapacitating. Thought processing is no longer possible. Difficult to remember your own name. Control of movement and coordination are lost.   The worst pain imaginable 10 At this level, most patients pass out from pain. When this level is reached, collapse of the autonomic nervous system occurs, leading to a sudden drop in blood pressure and heart rate. This in turn results in a temporary and dramatic drop in blood flow to the brain, leading to a loss of consciousness. Fainting is one of the body's self defense mechanisms. Passing out puts the brain in a calmed state and causes it to shut down for a while, in order to begin the healing process.    Summary: 1. Refer to this scale when providing [place] with your pain level. 2. Be accurate and careful when reporting your pain level. This will help with your care. 3. Over-reporting  your pain level will lead to loss of credibility. 4. Even a level of 1/10 means that there is pain and will be treated at our facility. 5. High, inaccurate reporting will be documented as "Symptom Exaggeration", leading to loss of credibility and suspicions of possible secondary gains such as obtaining more narcotics, or wanting to appear disabled, for fraudulent reasons. 6. Only pain levels of 5 or below will be seen at our facility. 7. Pain levels of 6 and above will be sent to the Emergency Department and the appointment cancelled.  A prescription for Medrol Dosepack was sent to your pharmacy. _____________________________________________________________________________________________

## 2016-09-15 ENCOUNTER — Telehealth: Payer: Self-pay | Admitting: Family Medicine

## 2016-09-15 NOTE — Telephone Encounter (Signed)
Called Pt to schedule AWV with [facility] - knb °

## 2016-09-25 ENCOUNTER — Other Ambulatory Visit: Payer: Self-pay

## 2016-09-25 DIAGNOSIS — Z1231 Encounter for screening mammogram for malignant neoplasm of breast: Secondary | ICD-10-CM

## 2016-10-06 ENCOUNTER — Ambulatory Visit: Payer: Medicare Other | Admitting: Pain Medicine

## 2016-10-06 ENCOUNTER — Encounter: Payer: Self-pay | Admitting: Nurse Practitioner

## 2016-10-06 DIAGNOSIS — E876 Hypokalemia: Secondary | ICD-10-CM | POA: Insufficient documentation

## 2016-10-06 DIAGNOSIS — R7982 Elevated C-reactive protein (CRP): Secondary | ICD-10-CM | POA: Insufficient documentation

## 2016-10-06 HISTORY — DX: Elevated C-reactive protein (CRP): R79.82

## 2016-10-12 NOTE — Discharge Instructions (Signed)
°  Instructions after Total Knee Replacement ° ° Phillippe Orlick P. Connell Bognar, Jr., M.D.    ° Dept. of Orthopaedics & Sports Medicine ° [facility] ° [address] ° [place], [place]  [address] ° Phone: [phone]   Fax: [phone] ° °  °DIET: °• Drink plenty of non-alcoholic fluids. °• Resume your normal diet. Include foods high in fiber. ° °ACTIVITY:  °• You may use crutches or a walker with weight-bearing as tolerated, unless instructed otherwise. °• You may be weaned off of the walker or crutches by your Physical Therapist.  °• Do NOT place pillows under the knee. Anything placed under the knee could limit your ability to straighten the knee.   °• Continue doing gentle exercises. Exercising will reduce the pain and swelling, increase motion, and prevent muscle weakness.   °• Please continue to use the TED compression stockings for 6 weeks. You may remove the stockings at night, but should reapply them in the morning. °• Do not drive or operate any equipment until instructed. ° °WOUND CARE:  °• Continue to use the PolarCare or ice packs periodically to reduce pain and swelling. °• You may bathe or shower after the staples are removed at the first office visit following surgery. ° °MEDICATIONS: °• You may resume your regular medications. °• Please take the pain medication as prescribed on the medication. °• Do not take pain medication on an empty stomach. °• You have been given a prescription for a blood thinner (Lovenox or Coumadin). Please take the medication as instructed. (NOTE: After completing a 2 week course of Lovenox, take one Enteric-coated aspirin once a day. This along with elevation will help reduce the possibility of phlebitis in your operated leg.) °• Do not drive or drink alcoholic beverages when taking pain medications. ° °CALL THE OFFICE FOR: °• Temperature above 101 degrees °• Excessive bleeding or drainage on the dressing. °• Excessive swelling, coldness, or paleness of the toes. °• Persistent  nausea and vomiting. ° °FOLLOW-UP:  °• You should have an appointment to return to the office in 10-14 days after surgery. °• Arrangements have been made for continuation of Physical Therapy (either home therapy or outpatient therapy). °  °

## 2016-10-17 ENCOUNTER — Ambulatory Visit: Payer: Medicare Other

## 2016-10-21 ENCOUNTER — Ambulatory Visit
Admission: RE | Admit: 2016-10-21 | Discharge: 2016-10-21 | Disposition: A | Discharge status: Home / Self Care | Payer: Medicare Other | Source: Ambulatory Visit | Attending: General Surgery | Admitting: General Surgery

## 2016-10-21 DIAGNOSIS — Z1231 Encounter for screening mammogram for malignant neoplasm of breast: Secondary | ICD-10-CM | POA: Diagnosis not present

## 2016-10-21 DIAGNOSIS — R921 Mammographic calcification found on diagnostic imaging of breast: Secondary | ICD-10-CM | POA: Diagnosis not present

## 2016-10-22 ENCOUNTER — Other Ambulatory Visit: Payer: Self-pay | Admitting: General Surgery

## 2016-10-22 DIAGNOSIS — R921 Mammographic calcification found on diagnostic imaging of breast: Secondary | ICD-10-CM

## 2016-10-22 DIAGNOSIS — R928 Other abnormal and inconclusive findings on diagnostic imaging of breast: Secondary | ICD-10-CM

## 2016-10-27 ENCOUNTER — Encounter: Payer: Self-pay | Admitting: *Deleted

## 2016-10-27 ENCOUNTER — Ambulatory Visit
Admission: EM | Admit: 2016-10-27 | Discharge: 2016-10-27 | Disposition: A | Discharge status: Home / Self Care | Payer: Medicare Other | Attending: Family Medicine | Admitting: Family Medicine

## 2016-10-27 DIAGNOSIS — J22 Unspecified acute lower respiratory infection: Secondary | ICD-10-CM | POA: Diagnosis not present

## 2016-10-27 MED ORDER — AZITHROMYCIN 250 MG PO TABS: ORAL_TABLET | ORAL | 0 refills | Status: DC

## 2016-10-27 MED ORDER — HYDROCOD POLST-CPM POLST ER 10-8 MG/5ML PO SUER: 5.0000 mL | Freq: Two times a day (BID) | ORAL | 0 refills | Status: DC

## 2016-10-27 MED ORDER — BENZONATATE 200 MG PO CAPS: ORAL_CAPSULE | ORAL | 0 refills | Status: DC

## 2016-10-27 NOTE — ED Triage Notes (Signed)
Patient started having breathing issues 1 month ago that partially resolved. Recently her grand daughter was diagnosed with bronchitis. Patient started having severe cough and chest congestion symptoms 2 weeks ago that have progressively become worse. Additional symptom of bilateral ear fullness is present. No symptoms of nasal congestion or drainage.

## 2016-10-27 NOTE — ED Provider Notes (Signed)
CSN: [id number]     Arrival date & time 10/27/16  1520 History   First MD Initiated Contact with Patient 10/27/16 1651     Chief Complaint  Patient presents with  . Cough  . Otalgia   (Consider location/radiation/quality/duration/timing/severity/associated sxs/prior Treatment) HPI  This a 66 year old female who states that she started having breathing issues about one month ago partially resolved. However she continues to have cough and chest congestion for the last 2 weeks that has become worse. She states that she has shortness of breath with exercise for the last 2 weeks. Her granddaughter was diagnosed here with bronchitis has a history of asthma also. Patient says that the cough is nonproductive but she feels as though she has a congestion in her chest x-ray did come up if she worked hard enough. Yesterday she coughed such force that she had a pull off the side road to finish the coughing peroxisom        Past Medical History:  Diagnosis Date  . Arthritis   . Asthma   . Bronchitis 09/04/2016   urgent care  . Diabetes mellitus without complication ([place])   . GERD (gastroesophageal reflux disease)   . History of shingles   . Hyperlipidemia   . Hypertension   . Neuralgia    Right side of head and face  . Shingles    chronic on Rt side of head and face   Past Surgical History:  Procedure Laterality Date  . ABDOMINAL HYSTERECTOMY    . BREAST BIOPSY Right 2016   benign  . BREAST BIOPSY Right 04/08/2016   neg- benign  . BREAST LUMPECTOMY WITH NEEDLE LOCALIZATION Right 05/02/2016   Procedure: BREAST LUMPECTOMY WITH NEEDLE LOCALIZATION;  Surgeon: Clayburn Pert, MD;  Location: [facility] ORS;  Service: General;  Laterality: Right;  . CHOLECYSTECTOMY    . TOTAL KNEE ARTHROPLASTY Right 2014  . TOTAL KNEE ARTHROPLASTY Right 02/16/2013   Family History  Problem Relation Age of Onset  . Anuerysm Mother        Brain  . Dementia Mother   . Heart failure Father   . CAD Father   .  Dementia Father   . Diabetes Father   . Lupus Sister   . Diabetes Brother   . Throat cancer Maternal Grandfather   . Breast cancer Neg Hx    Social History  Substance Use Topics  . Smoking status: Never Smoker  . Smokeless tobacco: Never Used  . Alcohol use 0.0 oz/week     Comment: seldom   OB History    Gravida Para Term Preterm AB Living   2 2           SAB TAB Ectopic Multiple Live Births                 Review of Systems  Constitutional: Positive for activity change. Negative for chills, fatigue and fever.  HENT: Positive for congestion.   Respiratory: Positive for cough and shortness of breath. Negative for wheezing and stridor.   All other systems reviewed and are negative.   Allergies  Lisinopril; Lovastatin; Other; and Shellfish allergy  Home Medications   Prior to Admission medications   Medication Sig Start Date End Date Taking? Authorizing Provider  albuterol (PROVENTIL HFA;VENTOLIN HFA) 108 (90 Base) MCG/ACT inhaler Inhale 2 puffs into the lungs every 4 (four) hours as needed for wheezing or shortness of breath. 09/05/16  Yes Frederich Cha, MD  amLODipine (NORVASC) 5 MG tablet Take 1 tablet (5  mg total) by mouth daily. 05/08/15  Yes Chrismon, Vickki Muff, PA  aspirin EC 81 MG tablet Take 81 mg by mouth daily.   Yes [provider]  gabapentin (NEURONTIN) 600 MG tablet Take 6 tablets by mouth daily 06/23/16  Yes Chrismon, Vickki Muff, PA  hydrochlorothiazide (HYDRODIURIL) 25 MG tablet Take 1 tablet (25 mg total) by mouth daily. 03/28/16  Yes Chrismon, Vickki Muff, PA  NONFORMULARY OR COMPOUNDED ITEM Compounded cream: 6% Gabapentin, 2.5% Lidocaine, 0.5% Meloxicam, 5% Methocarbamol, 2.5% Prilocaine. Sig: Apply 1-2 gm(s) (2-4 pumps) to affected area, 3-4 times/day. (1 pump = 0.5 gm) Dispense: 120 gm Pump Bottle. Dispenser: 1 pump = 0.5 gm. 07/24/16  Yes Milinda Pointer, MD  Olopatadine HCl 0.7 % SOLN Apply 1 drop to eye daily. 10/25/15  Yes Chrismon, Vickki Muff, PA   traZODone (DESYREL) 50 MG tablet Take 0.5-1 tablets (25-50 mg total) by mouth at bedtime as needed for sleep. 06/23/16  Yes Chrismon, Vickki Muff, PA  azithromycin (ZITHROMAX Z-PAK) 250 MG tablet Use as per package instructions 10/27/16   Lorin Picket, PA-C  benzonatate (TESSALON) 200 MG capsule Take one cap TID PRN cough 10/27/16   Lorin Picket, PA-C  chlorpheniramine-HYDROcodone (TUSSIONEX PENNKINETIC ER) 10-8 MG/5ML SUER Take 5 mLs by mouth 2 (two) times daily. 10/27/16   Lorin Picket, PA-C   Meds Ordered and Administered this Visit  Medications - No data to display  BP 139/69 (BP Location: Left Arm)   Pulse 96   Temp 97.8 F (36.6 C) (Oral)   Resp 16   SpO2 97%  No data found.   Physical Exam  Constitutional: She appears well-developed and well-nourished. No distress.  HENT:  Head: Normocephalic.  Right Ear: External ear normal.  Left Ear: External ear normal.  Nose: Nose normal.  Mouth/Throat: Oropharynx is clear and moist. No oropharyngeal exudate.  Eyes: Pupils are equal, round, and reactive to light.  Neck: Normal range of motion.  Pulmonary/Chest: Effort normal and breath sounds normal. No respiratory distress. She has no wheezes. She has no rales.  Musculoskeletal: Normal range of motion.  Neurological: She is alert.  Skin: Skin is warm and dry. She is not diaphoretic.  Psychiatric: She has a normal mood and affect. Her behavior is normal. Judgment and thought content normal.  Nursing note and vitals reviewed.   Urgent Care Course     Procedures (including critical care time)  Labs Review Labs Reviewed - No data to display  Imaging Review No results found.   Visual Acuity Review  Right Eye Distance:   Left Eye Distance:   Bilateral Distance:    Right Eye Near:   Left Eye Near:    Bilateral Near:         MDM   1. Lower resp. tract infection    Discharge Medication List as of 10/27/2016  5:10 PM    START taking these medications    Details  benzonatate (TESSALON) 200 MG capsule Take one cap TID PRN cough, Normal      Plan: 1. Test/x-ray results and diagnosis reviewed with patient 2. rx as per orders; risks, benefits, potential side effects reviewed with patient 3. Recommend supportive treatment with Rest and fluids. Use your albuterol inhaler as necessary. If you're improving in a week have returned to our clinic or follow up with your primary care physician. 4. F/u prn if symptoms worsen or don't improve     Lorin Picket, PA-C 10/27/16 1719

## 2016-10-29 ENCOUNTER — Encounter: Payer: Self-pay | Admitting: Pain Medicine

## 2016-10-29 ENCOUNTER — Ambulatory Visit: Payer: Medicare Other | Attending: Pain Medicine | Admitting: Pain Medicine

## 2016-10-29 VITALS — BP 148/69 | HR 91 | Temp 98.6°F | Resp 16 | Ht 63.0 in | Wt 220.0 lb

## 2016-10-29 DIAGNOSIS — F419 Anxiety disorder, unspecified: Secondary | ICD-10-CM | POA: Insufficient documentation

## 2016-10-29 DIAGNOSIS — M792 Neuralgia and neuritis, unspecified: Secondary | ICD-10-CM | POA: Diagnosis not present

## 2016-10-29 DIAGNOSIS — E669 Obesity, unspecified: Secondary | ICD-10-CM | POA: Insufficient documentation

## 2016-10-29 DIAGNOSIS — F329 Major depressive disorder, single episode, unspecified: Secondary | ICD-10-CM | POA: Diagnosis not present

## 2016-10-29 DIAGNOSIS — B0221 Postherpetic geniculate ganglionitis: Secondary | ICD-10-CM | POA: Insufficient documentation

## 2016-10-29 DIAGNOSIS — E119 Type 2 diabetes mellitus without complications: Secondary | ICD-10-CM | POA: Diagnosis not present

## 2016-10-29 DIAGNOSIS — K219 Gastro-esophageal reflux disease without esophagitis: Secondary | ICD-10-CM | POA: Insufficient documentation

## 2016-10-29 DIAGNOSIS — Z91013 Allergy to seafood: Secondary | ICD-10-CM | POA: Insufficient documentation

## 2016-10-29 DIAGNOSIS — G629 Polyneuropathy, unspecified: Secondary | ICD-10-CM | POA: Insufficient documentation

## 2016-10-29 DIAGNOSIS — G500 Trigeminal neuralgia: Secondary | ICD-10-CM | POA: Diagnosis not present

## 2016-10-29 DIAGNOSIS — Z6835 Body mass index (BMI) 35.0-35.9, adult: Secondary | ICD-10-CM | POA: Insufficient documentation

## 2016-10-29 DIAGNOSIS — B0229 Other postherpetic nervous system involvement: Secondary | ICD-10-CM | POA: Diagnosis not present

## 2016-10-29 DIAGNOSIS — B0230 Zoster ocular disease, unspecified: Secondary | ICD-10-CM | POA: Diagnosis not present

## 2016-10-29 DIAGNOSIS — Z79891 Long term (current) use of opiate analgesic: Secondary | ICD-10-CM | POA: Insufficient documentation

## 2016-10-29 DIAGNOSIS — E7800 Pure hypercholesterolemia, unspecified: Secondary | ICD-10-CM | POA: Insufficient documentation

## 2016-10-29 DIAGNOSIS — J45909 Unspecified asthma, uncomplicated: Secondary | ICD-10-CM | POA: Diagnosis not present

## 2016-10-29 DIAGNOSIS — G894 Chronic pain syndrome: Secondary | ICD-10-CM | POA: Diagnosis not present

## 2016-10-29 DIAGNOSIS — E876 Hypokalemia: Secondary | ICD-10-CM | POA: Insufficient documentation

## 2016-10-29 DIAGNOSIS — G501 Atypical facial pain: Secondary | ICD-10-CM | POA: Insufficient documentation

## 2016-10-29 DIAGNOSIS — I10 Essential (primary) hypertension: Secondary | ICD-10-CM | POA: Insufficient documentation

## 2016-10-29 DIAGNOSIS — B0222 Postherpetic trigeminal neuralgia: Secondary | ICD-10-CM

## 2016-10-29 DIAGNOSIS — M1712 Unilateral primary osteoarthritis, left knee: Secondary | ICD-10-CM | POA: Diagnosis not present

## 2016-10-29 DIAGNOSIS — Z7982 Long term (current) use of aspirin: Secondary | ICD-10-CM | POA: Insufficient documentation

## 2016-10-29 DIAGNOSIS — Z79899 Other long term (current) drug therapy: Secondary | ICD-10-CM | POA: Insufficient documentation

## 2016-10-29 MED ORDER — NONFORMULARY OR COMPOUNDED ITEM
99 refills | Status: AC
Start: 2016-10-29 — End: 2017-04-27

## 2016-10-29 MED ORDER — GABAPENTIN 800 MG PO TABS: 800.0000 mg | ORAL_TABLET | Freq: Four times a day (QID) | ORAL | 1 refills | Status: DC

## 2016-10-29 NOTE — Progress Notes (Signed)
Safety precautions to be maintained throughout the outpatient stay will include: orient to surroundings, keep bed in low position, maintain call bell within reach at all times, provide assistance with transfer out of bed and ambulation.  

## 2016-10-29 NOTE — Progress Notes (Signed)
Patient's Name: [name]  MRN: [id number]  Referring Provider: Margo Common, [place]  DOB: 1950/08/21  PCP: Margo Common, PA  DOS: 10/29/2016  Note by: Kathlen Brunswick. Dossie Arbour, MD  Service setting: Ambulatory outpatient  Specialty: Interventional Pain Management  Location: [facility] (AMB) Pain Management Facility    Patient type: Established   Primary Reason(s) for Visit: Evaluation of chronic illnesses with exacerbation, or progression (Level of risk: moderate) CC: Headache (right side of head)  HPI  [name] is a 66 y.o. year old, female patient, who comes today for a follow-up evaluation. She has Postherpetic neuralgia; Depression; Obesity (BMI 35.0-39.9 without comorbidity) ([place]); Neuropathic postherpetic trigeminal neuralgia (Location of Primary Source of Pain) (Right) (V1); Anxiety; Asthma; Diabetes ([place]); Photopsia; History of colon polyps; Calcium deficiency disease; Cannot sleep; Hypercholesterolemia without hypertriglyceridemia; Avitaminosis A; Avitaminosis D; Gasserian ganglionitis; Neuropathic pain; Chronic Atypical facial pain (Location of Primary Source of Pain) (Right); Chronic pain syndrome; Encounter for long-term (current) use of medications; Disturbance of skin sensation; Occipital headache; Bilateral occipital neuralgia; Breast mass, right; Herpes zoster ophthalmicus; Hypertension; Long term current use of opiate analgesic; Long term prescription opiate use; Opiate use; Primary osteoarthritis of left knee; Elevated C-reactive protein (CRP); and Hypokalemia on her problem list. [name] was last seen on 09/08/2016. Her primarily concern today is the Headache (right side of head)  Pain Assessment: Self-Reported Pain Score: 8 /10 Clinically the patient looks like a 3/10 Reported level is inconsistent with clinical observations. Information on the proper use of the pain scale provided to the patient today Pain Type: Chronic pain Pain Location: Head Pain Orientation: Right Pain  Descriptors / Indicators: Aching, Constant, Tender, Throbbing, Pressure, Penetrating Pain Frequency: Constant  Further details on both, my assessment(s), as well as the proposed treatment plan, please see below. The patient returns to the clinics today to discuss some alternatives. The recent oral steroid trial did not provide the patient with any further relief. This goes along with the findings that we seen during nerve blocks where the Diagnostic Supraorbital & Supratrochlear Nerve Block (Trigeminal Nerve Branches) only provided her with relief that lasted for the duration of the local anesthetic. Even though I have done this area and ganglion radiofrequencies, I have not attempted to get the ophthalmic branch because of how deep it is. I informed the patient that I rather late a neurosurgeon do this, in the event that there is bleeding requiring further surgery. Because of how close the supraorbital and supratrochlear nerve are to the skin, I do not feel comfortable doing radiofrequency or cryo-analgesia in that area, due to a possibility of skin lesioning. All of this was explained to the patient in layman's terms. With regards to her medications, she is currently taking 3600 mg of gabapentin. Where she has been doing is taking the 600 mg pill, 3 of them at a time, twice a day. She is concerned about taking so many pills and therefore I gave her the option of switching her to the 800 mg 4 times a day. I do realize that this will be a total dose of 3200 mg, but this way she may be able to tolerated better since she indicates that she gets very dizzy and when she takes the gabapentin she usually does not go anywhere else and stays home. By this treating the medication through the day I hope to minimize the side effects and still get the benefits. Today she has been given 6 months worth of the medication. In  addition, the patient indicates that the analgesic cream that we gave her is working well. We will  continue with the these medication and I have given her another refill for 6 months on the cream. Because it is a compounded cream, she has not been able to send it out to the mail order pharmacy. Apparently they do know compounding medication.  Today I will be referring this patient to the neurosurgeons to see if they think that she may be a good candidate for a gasserian ganglion radiofrequency or direct lesioning of the supraorbital and supratrochlear nerve. I will continue to see her every 6 months for medication refills.  Laboratory Chemistry  Inflammation Markers Lab Results  Component Value Date   CRP 1.4 (H) 07/25/2016   ESRSEDRATE 13 07/25/2016   (CRP: Acute Phase) (ESR: Chronic Phase) Renal Function Markers Lab Results  Component Value Date   BUN 17 07/25/2016   CREATININE 0.82 07/25/2016   GFRAA >60 07/25/2016   GFRNONAA >60 07/25/2016   Hepatic Function Markers Lab Results  Component Value Date   AST 14 (L) 07/25/2016   ALT 14 07/25/2016   ALBUMIN 3.7 07/25/2016   ALKPHOS 85 07/25/2016   Electrolytes Lab Results  Component Value Date   NA 137 07/25/2016   K 3.4 (L) 07/25/2016   CL 100 (L) 07/25/2016   CALCIUM 8.8 (L) 07/25/2016   MG 2.0 07/25/2016   Neuropathy Markers Lab Results  Component Value Date   VITAMINB12 375 07/25/2016   Bone Pathology Markers Lab Results  Component Value Date   ALKPHOS 85 07/25/2016   25OHVITD1 32 07/25/2016   25OHVITD2 5.1 07/25/2016   25OHVITD3 27 07/25/2016   CALCIUM 8.8 (L) 07/25/2016   Coagulation Parameters Lab Results  Component Value Date   INR 1.0 01/31/2013   LABPROT 13.2 01/31/2013   APTT 27.8 01/31/2013   PLT 291 03/28/2016   Cardiovascular Markers Lab Results  Component Value Date   HGB 15.0 05/02/2016   HCT 44.0 05/02/2016   Note: Lab results reviewed.  Recent Diagnostic Imaging Review  Cervical Imaging: Cervical DG complete:  Results for orders placed during the hospital encounter of 01/21/16   DG Cervical Spine Complete   Narrative CLINICAL DATA:  Pain.  EXAM: CERVICAL SPINE - COMPLETE 4+ VIEW  COMPARISON:  CT 04/16/2002.  CT 01/01/2008.  FINDINGS: Multilevel degenerative change cervical spine. Disc space loss with endplate osteophyte formation noted at C4-C5, C5-C6, C6-C7. Loss of normal cervical lordosis. No evidence of fracture or dislocation. Pulmonary apices are clear.  IMPRESSION: Diffuse degenerative change cervical spine with loss of normal cervical lordosis.   Electronically Signed   By: Marcello Moores  Register   On: 01/21/2016 13:21    Shoulder Imaging: Gaston Islam DG:  Results for orders placed in visit on 01/01/08  DG Shoulder Right   Narrative * PRIOR REPORT IMPORTED FROM AN EXTERNAL SYSTEM *   PRIOR REPORT IMPORTED FROM THE [facility]NGO WORKFLOW SYSTEM   REASON FOR EXAM:    fall pain  COMMENTS:   PROCEDURE:     DXR - DXR SHOULDER RIGHT COMPLETE  - Jan 01 2008  1:57PM   RESULT:     Three views of the RIGHT shoulder reveal the bones to be  osteopenic. I do not see evidence of an acute fracture.   IMPRESSION:   I see no acute bony abnormality of the shoulder. If there are strong  clinical concerns of an occult humeral head fracture, further evaluation  with CT scanning would be useful.  I do not see objective evidence of such  a  fracture on these three plain films.   Thank you for the opportunity to contribute to the care of your patient.       Knee Imaging: Knee-R MR wo contrast:  Results for orders placed in visit on 03/09/08  MR Knee Right Wo Contrast   Narrative * PRIOR REPORT IMPORTED FROM AN EXTERNAL SYSTEM *   PRIOR REPORT IMPORTED FROM THE SYNGO WORKFLOW SYSTEM   REASON FOR EXAM:    right knee pain  COMMENTS:   PROCEDURE:     MR  - MR KNEE RT  WO  CONTRAST  - Mar 09 2008  3:10PM   RESULT:     Multiplanar/multisequence imaging of the knee was obtained  without the administration of gadolinium.   There is normal proportion of  hemopoietic bone marrow fat for the  patient's  age. No evidence of abnormal marrow signal is appreciated to suggest the  sequela of contusion or occult fracture. There is no linear signal  abnormality to suggest the presence of an acute fracture.  Mild  degenerative  subchondral cyst formation is appreciated within the lateral femoral  trochlea. Moderate degenerative osteophytosis is appreciated.   A moderate joint effusion is present. There is a small noninflammatory  Baker's cyst within the medial popliteal fossa. There is mild anterior  subcutaneous edema.  Anterior cruciate ligament and posterior cruciate  ligaments are unremarkable.  Medial collateral ligament and lateral  collateral ligamentous complexes as well as quadriceps tendon and patellar  ligaments are intact. There is degenerative deformity, volume loss and  intrasubstance signal abnormality throughout both menisci. There is no  meniscal linear signal abnormality to suggest tearing. There is near  complete degenerative loss of the medial patellar articular cartilage.  There is moderate diffuse thinning of the lateral joint compartment  articular cartilage. There is diffuse thinning of the medial joint  compartment articular cartilage. There are non-rounded free bodies in the  lateral suprapatellar bursa with the largest measuring 9 mm.  There is a  single 7 mm free body in the anterior intercondylar region.   IMPRESSION:   1.     Articular cartilage degeneration most pronounced in the lateral  patellar region.  2.     Numerous free intraarticular osteochondral bodies.  3.     Degenerative changes of both menisci with no evidence of a meniscal  tear.  4.     Moderate nonspecific joint effusion.   Thank you for the opportunity to contribute to the care of your patient.     Knee-R DG 1-2 views:  Results for orders placed in visit on 02/16/13  DG Knee 1-2 Views Right   Narrative * PRIOR REPORT IMPORTED FROM AN  EXTERNAL SYSTEM *   PRIOR REPORT IMPORTED FROM THE [facility] WORKFLOW SYSTEM   REASON FOR EXAM:    postop  COMMENTS:   Bedside (portable):Y   PROCEDURE:     DXR - DXR KNEE RIGHT AP AND LATERAL  - Feb 16 2013  3:02PM   RESULT:     History: Post-op TKR   Findings:   AP and lateral views of the right knee demonstrates a total right knee  arthroplasty without evidence of hardware failure complication. There is  no  significant joint effusion. There is no fracture or dislocation. The  alignment is anatomic.   IMPRESSION:   Right total knee arthroplasty.   Dictation Site: 1  Note: Results of ordered imaging test(s) reviewed and explained to patient in Layman's terms. Copy of results provided to patient  Meds  The patient has a current medication list which includes the following prescription(s): albuterol, amlodipine, aspirin ec, azithromycin, benzonatate, chlorpheniramine-hydrocodone, gabapentin, hydrochlorothiazide, NONFORMULARY OR COMPOUNDED ITEM, olopatadine hcl, and trazodone.  Current Outpatient Prescriptions on File Prior to Visit  Medication Sig  . albuterol (PROVENTIL HFA;VENTOLIN HFA) 108 (90 Base) MCG/ACT inhaler Inhale 2 puffs into the lungs every 4 (four) hours as needed for wheezing or shortness of breath.  Marland Kitchen amLODipine (NORVASC) 5 MG tablet Take 1 tablet (5 mg total) by mouth daily.  Marland Kitchen aspirin EC 81 MG tablet Take 81 mg by mouth daily.  Marland Kitchen azithromycin (ZITHROMAX Z-PAK) 250 MG tablet Use as per package instructions  . benzonatate (TESSALON) 200 MG capsule Take one cap TID PRN cough  . chlorpheniramine-HYDROcodone (TUSSIONEX PENNKINETIC ER) 10-8 MG/5ML SUER Take 5 mLs by mouth 2 (two) times daily.  . hydrochlorothiazide (HYDRODIURIL) 25 MG tablet Take 1 tablet (25 mg total) by mouth daily.  . Olopatadine HCl 0.7 % SOLN Apply 1 drop to eye daily.  . traZODone (DESYREL) 50 MG tablet Take 0.5-1 tablets (25-50 mg total) by mouth at bedtime as needed for sleep.   No  current facility-administered medications on file prior to visit.    ROS  Constitutional: Denies any fever or chills Gastrointestinal: No reported hemesis, hematochezia, vomiting, or acute GI distress Musculoskeletal: Denies any acute onset joint swelling, redness, loss of ROM, or weakness Neurological: No reported episodes of acute onset apraxia, aphasia, dysarthria, agnosia, amnesia, paralysis, loss of coordination, or loss of consciousness  Allergies  [name] is allergic to lisinopril; lovastatin; other; and shellfish allergy.  PFSH  Drug: [name]  reports that she does not use drugs. Alcohol:  reports that she drinks alcohol. Tobacco:  reports that she has never smoked. She has never used smokeless tobacco. Medical:  has a past medical history of Arthritis; Asthma; Bronchitis (09/04/2016); Diabetes mellitus without complication ([place]); GERD (gastroesophageal reflux disease); History of shingles; Hyperlipidemia; Hypertension; Neuralgia; and Shingles. Family: family history includes Anuerysm in her mother; CAD in her father; Dementia in her father and mother; Diabetes in her brother and father; Heart failure in her father; Lupus in her sister; Throat cancer in her maternal grandfather.  Past Surgical History:  Procedure Laterality Date  . ABDOMINAL HYSTERECTOMY    . BREAST BIOPSY Right 2016   benign  . BREAST BIOPSY Right 04/08/2016   neg- benign  . BREAST LUMPECTOMY WITH NEEDLE LOCALIZATION Right 05/02/2016   Procedure: BREAST LUMPECTOMY WITH NEEDLE LOCALIZATION;  Surgeon: Clayburn Pert, MD;  Location: [facility];  Service: General;  Laterality: Right;  . CHOLECYSTECTOMY    . TOTAL KNEE ARTHROPLASTY Right 2014  . TOTAL KNEE ARTHROPLASTY Right 02/16/2013   Constitutional Exam  General appearance: Well nourished, well developed, and well hydrated. In no apparent acute distress Vitals:   10/29/16 1258  BP: (!) 148/69  Pulse: 91  Resp: 16  Temp: 98.6 F (37 C)  SpO2: 99%   Weight: 220 lb (99.8 kg)  Height: '5\' 3"'$  (1.6 m)   BMI Assessment: Estimated body mass index is 38.97 kg/m as calculated from the following:   Height as of this encounter: '5\' 3"'$  (1.6 m).   Weight as of this encounter: 220 lb (99.8 kg).  BMI interpretation table: BMI level Category Range association with higher incidence of chronic pain  <18 kg/m2 Underweight   18.5-24.9 kg/m2  Ideal body weight   25-29.9 kg/m2 Overweight Increased incidence by 20%  30-34.9 kg/m2 Obese (Class I) Increased incidence by 68%  35-39.9 kg/m2 Severe obesity (Class II) Increased incidence by 136%  >40 kg/m2 Extreme obesity (Class III) Increased incidence by 254%   BMI Readings from Last 4 Encounters:  10/29/16 38.97 kg/m  09/08/16 38.62 kg/m  09/05/16 38.97 kg/m  08/04/16 37.20 kg/m   Wt Readings from Last 4 Encounters:  10/29/16 220 lb (99.8 kg)  09/08/16 218 lb (98.9 kg)  09/05/16 220 lb (99.8 kg)  08/04/16 210 lb (95.3 kg)  Psych/Mental status: Alert, oriented x 3 (person, place, & time)       Eyes: PERLA Respiratory: No evidence of acute respiratory distress  Cervical Spine Exam  Inspection: No masses, redness, or swelling Alignment: Symmetrical Functional ROM: Unrestricted ROM      Stability: No instability detected Muscle strength & Tone: Functionally intact Sensory: Allodynia (Painful response to non-painful stimuli) over the distribution of the right V1 and Supraorbital & Supratrochlear Nerves Palpation: No palpable anomalies              Upper Extremity (UE) Exam    Side: Right upper extremity  Side: Left upper extremity  Inspection: No masses, redness, swelling, or asymmetry. No contractures  Inspection: No masses, redness, swelling, or asymmetry. No contractures  Functional ROM: Unrestricted ROM          Functional ROM: Unrestricted ROM          Muscle strength & Tone: Functionally intact  Muscle strength & Tone: Functionally intact  Sensory: Unimpaired  Sensory: Unimpaired   Palpation: No palpable anomalies              Palpation: No palpable anomalies              Specialized Test(s): Deferred         Specialized Test(s): Deferred          Thoracic Spine Exam  Inspection: No masses, redness, or swelling Alignment: Symmetrical Functional ROM: Unrestricted ROM Stability: No instability detected Sensory: Unimpaired Muscle strength & Tone: No palpable anomalies  Lumbar Spine Exam  Inspection: No masses, redness, or swelling Alignment: Symmetrical Functional ROM: Unrestricted ROM      Stability: No instability detected Muscle strength & Tone: Functionally intact Sensory: Unimpaired Palpation: No palpable anomalies       Provocative Tests: Lumbar Hyperextension and rotation test: evaluation deferred today       Patrick's Maneuver: evaluation deferred today                    Gait & Posture Assessment  Ambulation: Unassisted Gait: Relatively normal for age and body habitus Posture: WNL   Lower Extremity Exam    Side: Right lower extremity  Side: Left lower extremity  Inspection: No masses, redness, swelling, or asymmetry. No contractures  Inspection: No masses, redness, swelling, or asymmetry. No contractures  Functional ROM: Unrestricted ROM          Functional ROM: Unrestricted ROM          Muscle strength & Tone: Functionally intact  Muscle strength & Tone: Functionally intact  Sensory: Unimpaired  Sensory: Unimpaired  Palpation: No palpable anomalies  Palpation: No palpable anomalies   Assessment  Primary Diagnosis & Pertinent Problem List: The primary encounter diagnosis was Chronic Atypical facial pain (Location of Primary Source of Pain) (Right). Diagnoses of Postherpetic neuralgia, Neuropathic postherpetic trigeminal neuralgia (Location of Primary Source of Pain) (Right) (V1),  Neuropathic postherpetic trigeminal neuralgia (Right) (V1), Gasserian ganglionitis, Herpes zoster ophthalmicus, Neuropathic pain, and Chronic pain syndrome were also  pertinent to this visit.  Status Diagnosis  Persistent Persistent Persistent 1. Chronic Atypical facial pain (Location of Primary Source of Pain) (Right)   2. Postherpetic neuralgia   3. Neuropathic postherpetic trigeminal neuralgia (Location of Primary Source of Pain) (Right) (V1)   4. Neuropathic postherpetic trigeminal neuralgia (Right) (V1)   5. Gasserian ganglionitis   6. Herpes zoster ophthalmicus   7. Neuropathic pain   8. Chronic pain syndrome     Problems updated and reviewed during this visit: No problems updated. Plan of Care  Pharmacotherapy (Medications Ordered): Meds ordered this encounter  Medications  . gabapentin (NEURONTIN) 800 MG tablet    Sig: Take 1 tablet (800 mg total) by mouth every 6 (six) hours.    Dispense:  360 tablet    Refill:  1    Do not place medication on "Automatic Refill". Fill one day early if pharmacy is closed on scheduled refill date.  . NONFORMULARY OR COMPOUNDED ITEM    Sig: Compounded cream: 6% Gabapentin, 2.5% Lidocaine, 0.5% Meloxicam, 5% Methocarbamol, 2.5% Prilocaine. Sig: Apply 1-2 gm(s) (2-4 pumps) to affected area, 3-4 times/day. (1 pump = 0.5 gm) Dispense: 120 gm Pump Bottle. Dispenser: 1 pump = 0.5 gm.    Dispense:  1 each    Refill:  PRN    Do not place this medication, or any other prescription from our practice, on "Automatic Refill". Patient may have prescription filled one day early if pharmacy is closed on scheduled refill date.   New Prescriptions   No medications on file   Medications administered today: [name] had no medications administered during this visit. Lab-work, procedure(s), and/or referral(s): Orders Placed This Encounter  Procedures  . Ambulatory referral to Neurosurgery   Imaging and/or referral(s): AMB REFERRAL TO NEUROSURGERY  Interventional therapies: Planned, scheduled, and/or pending:   Not at this time.   Considering:   DiagnosticRight Supraorbital & SupratrochlearNerve Block  (Trigeminal Nerve Branches) Possible right supraorbital and supratrochlear RFA Diagnostic Right Trigeminal Nerve (V1) Block, under fluoro and IV sedation.   Palliative PRN treatment(s):   DiagnosticRight Supraorbital & SupratrochlearNerve Block (Trigeminal Nerve Branches)   Provider-requested follow-up: Return in about 6 months (around 05/01/2017) for Med-Mgmt, w/ NP.  Future Appointments Date Time Provider [place]  11/04/2016 9:00 AM [facility] DIAGNOSTIC [facility]  11/04/2016 9:20 AM [facility] [place] [facility]  11/12/2016 2:30 PM Clayburn Pert, MD [facility] None  11/18/2016 8:30 AM [facility] PAT1 [facility] None  12/18/2016 9:30 AM [facility]SE HEALTH ADVISOR [facility] None  12/18/2016 10:00 AM Chrismon, Vickki Muff, PA [facility] None  04/30/2017 9:30 AM Vevelyn Francois, NP [facility] None   Primary Care Physician: Margo Common, PA Location: [facility] Note by: Kathlen Brunswick. Dossie Arbour, M.D, DABA, DABAPM, DABPM, DABIPP, FIPP Date: 10/29/2016; Time: 5:28 PM  Patient instructions provided during this appointment: Patient Instructions   Pain Score  Introduction: The pain score used by this practice is the Verbal Numerical Rating Scale (VNRS-11). This is an 11-point scale. It is for adults and children 10 years or older. There are significant differences in how the pain score is reported, used, and applied. Forget everything you learned in the past and learn this scoring system.  General Information: The scale should reflect your current level of pain. Unless you are specifically asked for the level of your worst pain, or your average pain. If  you are asked for one of these two, then it should be understood that it is over the past 24 hours.  Basic Activities of Daily Living (ADL): Personal hygiene, dressing, eating, transferring, and using restroom.  Instructions: Most patients tend to report their level of pain as a combination of two factors, their physical  pain and their psychosocial pain. This last one is also known as "suffering" and it is reflection of how physical pain affects you socially and psychologically. From now on, report them separately. From this point on, when asked to report your pain level, report only your physical pain. Use the following table for reference.  Pain Clinic Pain Levels (0-5/10)  Pain Level Score Description  No Pain 0   Mild pain 1 Nagging, annoying, but does not interfere with basic activities of daily living (ADL). Patients are able to eat, bathe, get dressed, toileting (being able to get on and off the toilet and perform personal hygiene functions), transfer (move in and out of bed or a chair without assistance), and maintain continence (able to control bladder and bowel functions). Blood pressure and heart rate are unaffected. A normal heart rate for a healthy adult ranges from 60 to 100 bpm (beats per minute).   Mild to moderate pain 2 Noticeable and distracting. Impossible to hide from other people. More frequent flare-ups. Still possible to adapt and function close to normal. It can be very annoying and may have occasional stronger flare-ups. With discipline, patients may get used to it and adapt.   Moderate pain 3 Interferes significantly with activities of daily living (ADL). It becomes difficult to feed, bathe, get dressed, get on and off the toilet or to perform personal hygiene functions. Difficult to get in and out of bed or a chair without assistance. Very distracting. With effort, it can be ignored when deeply involved in activities.   Moderately severe pain 4 Impossible to ignore for more than a few minutes. With effort, patients may still be able to manage work or participate in some social activities. Very difficult to concentrate. Signs of autonomic nervous system discharge are evident: dilated pupils (mydriasis); mild sweating (diaphoresis); sleep interference. Heart rate becomes elevated (>115 bpm).  Diastolic blood pressure (lower number) rises above 100 mmHg. Patients find relief in laying down and not moving.   Severe pain 5 Intense and extremely unpleasant. Associated with frowning face and frequent crying. Pain overwhelms the senses.  Ability to do any activity or maintain social relationships becomes significantly limited. Conversation becomes difficult. Pacing back and forth is common, as getting into a comfortable position is nearly impossible. Pain wakes you up from deep sleep. Physical signs will be obvious: pupillary dilation; increased sweating; goosebumps; brisk reflexes; cold, clammy hands and feet; nausea, vomiting or dry heaves; loss of appetite; significant sleep disturbance with inability to fall asleep or to remain asleep. When persistent, significant weight loss is observed due to the complete loss of appetite and sleep deprivation.  Blood pressure and heart rate becomes significantly elevated. Caution: If elevated blood pressure triggers a pounding headache associated with blurred vision, then the patient should immediately seek attention at an urgent or emergency care unit, as these may be signs of an impending stroke.    Emergency Department Pain Levels (6-10/10)  Emergency Room Pain 6 Severely limiting. Requires emergency care and should not be seen or managed at an outpatient pain management facility. Communication becomes difficult and requires great effort. Assistance to reach the emergency department may be required.  Facial flushing and profuse sweating along with potentially dangerous increases in heart rate and blood pressure will be evident.   Distressing pain 7 Self-care is very difficult. Assistance is required to transport, or use restroom. Assistance to reach the emergency department will be required. Tasks requiring coordination, such as bathing and getting dressed become very difficult.   Disabling pain 8 Self-care is no longer possible. At this level, pain is  disabling. The individual is unable to do even the most "basic" activities such as walking, eating, bathing, dressing, transferring to a bed, or toileting. Fine motor skills are lost. It is difficult to think clearly.   Incapacitating pain 9 Pain becomes incapacitating. Thought processing is no longer possible. Difficult to remember your own name. Control of movement and coordination are lost.   The worst pain imaginable 10 At this level, most patients pass out from pain. When this level is reached, collapse of the autonomic nervous system occurs, leading to a sudden drop in blood pressure and heart rate. This in turn results in a temporary and dramatic drop in blood flow to the brain, leading to a loss of consciousness. Fainting is one of the body's self defense mechanisms. Passing out puts the brain in a calmed state and causes it to shut down for a while, in order to begin the healing process.    Summary: 1. Refer to this scale when providing [place] with your pain level. 2. Be accurate and careful when reporting your pain level. This will help with your care. 3. Over-reporting your pain level will lead to loss of credibility. 4. Even a level of 1/10 means that there is pain and will be treated at our facility. 5. High, inaccurate reporting will be documented as "Symptom Exaggeration", leading to loss of credibility and suspicions of possible secondary gains such as obtaining more narcotics, or wanting to appear disabled, for fraudulent reasons. 6. Only pain levels of 5 or below will be seen at our facility. 7. Pain levels of 6 and above will be sent to the Emergency Department and the appointment cancelled. _____________________________________________________________________________________________

## 2016-10-29 NOTE — Patient Instructions (Signed)

## 2016-11-04 ENCOUNTER — Ambulatory Visit
Admission: RE | Admit: 2016-11-04 | Discharge: 2016-11-04 | Disposition: A | Discharge status: Home / Self Care | Payer: Medicare Other | Source: Ambulatory Visit | Attending: General Surgery | Admitting: General Surgery

## 2016-11-04 DIAGNOSIS — R921 Mammographic calcification found on diagnostic imaging of breast: Secondary | ICD-10-CM

## 2016-11-04 DIAGNOSIS — R928 Other abnormal and inconclusive findings on diagnostic imaging of breast: Secondary | ICD-10-CM | POA: Diagnosis not present

## 2016-11-05 ENCOUNTER — Other Ambulatory Visit: Payer: Self-pay | Admitting: General Surgery

## 2016-11-05 DIAGNOSIS — R928 Other abnormal and inconclusive findings on diagnostic imaging of breast: Secondary | ICD-10-CM

## 2016-11-05 DIAGNOSIS — R921 Mammographic calcification found on diagnostic imaging of breast: Secondary | ICD-10-CM

## 2016-11-12 ENCOUNTER — Ambulatory Visit: Payer: Medicare Other | Admitting: General Surgery

## 2016-11-18 ENCOUNTER — Encounter
Admission: RE | Admit: 2016-11-18 | Discharge: 2016-11-18 | Disposition: A | Discharge status: Home / Self Care | Payer: Medicare Other | Source: Ambulatory Visit | Attending: Orthopedic Surgery | Admitting: Orthopedic Surgery

## 2016-11-18 ENCOUNTER — Ambulatory Visit
Admission: RE | Admit: 2016-11-18 | Discharge: 2016-11-18 | Disposition: A | Discharge status: Home / Self Care | Payer: Medicare Other | Source: Ambulatory Visit | Attending: General Surgery | Admitting: General Surgery

## 2016-11-18 DIAGNOSIS — M25562 Pain in left knee: Secondary | ICD-10-CM | POA: Diagnosis not present

## 2016-11-18 DIAGNOSIS — R921 Mammographic calcification found on diagnostic imaging of breast: Secondary | ICD-10-CM | POA: Insufficient documentation

## 2016-11-18 DIAGNOSIS — E119 Type 2 diabetes mellitus without complications: Secondary | ICD-10-CM | POA: Diagnosis not present

## 2016-11-18 DIAGNOSIS — R928 Other abnormal and inconclusive findings on diagnostic imaging of breast: Secondary | ICD-10-CM

## 2016-11-18 DIAGNOSIS — I10 Essential (primary) hypertension: Secondary | ICD-10-CM | POA: Insufficient documentation

## 2016-11-18 DIAGNOSIS — R920 Mammographic microcalcification found on diagnostic imaging of breast: Secondary | ICD-10-CM | POA: Diagnosis not present

## 2016-11-18 DIAGNOSIS — N6032 Fibrosclerosis of left breast: Secondary | ICD-10-CM | POA: Diagnosis not present

## 2016-11-18 HISTORY — DX: Anxiety disorder, unspecified: F41.9

## 2016-11-18 LAB — SURGICAL PCR SCREEN
MRSA, PCR: NEGATIVE
Staphylococcus aureus: NEGATIVE

## 2016-11-18 LAB — URINALYSIS, ROUTINE W REFLEX MICROSCOPIC
Bilirubin Urine: NEGATIVE
Glucose, UA: NEGATIVE mg/dL
HGB URINE DIPSTICK: NEGATIVE
Ketones, ur: NEGATIVE mg/dL
Leukocytes, UA: NEGATIVE
Nitrite: NEGATIVE
Protein, ur: NEGATIVE mg/dL
SPECIFIC GRAVITY, URINE: 1.012 (ref 1.005–1.030)
pH: 6 (ref 5.0–8.0)

## 2016-11-18 LAB — CBC
HEMATOCRIT: 41.7 % (ref 35.0–47.0)
Hemoglobin: 13.9 g/dL (ref 12.0–16.0)
MCH: 27.4 pg (ref 26.0–34.0)
MCHC: 33.2 g/dL (ref 32.0–36.0)
MCV: 82.5 fL (ref 80.0–100.0)
Platelets: 250 10*3/uL (ref 150–440)
RBC: 5.06 MIL/uL (ref 3.80–5.20)
RDW: 13.9 % (ref 11.5–14.5)
WBC: 12.6 10*3/uL — ABNORMAL HIGH (ref 3.6–11.0)

## 2016-11-18 LAB — COMPREHENSIVE METABOLIC PANEL
ALT: 13 U/L — ABNORMAL LOW (ref 14–54)
ANION GAP: 9 (ref 5–15)
AST: 21 U/L (ref 15–41)
Albumin: 3.8 g/dL (ref 3.5–5.0)
Alkaline Phosphatase: 70 U/L (ref 38–126)
BILIRUBIN TOTAL: 0.9 mg/dL (ref 0.3–1.2)
BUN: 13 mg/dL (ref 6–20)
CO2: 29 mmol/L (ref 22–32)
Calcium: 9.1 mg/dL (ref 8.9–10.3)
Chloride: 100 mmol/L — ABNORMAL LOW (ref 101–111)
Creatinine, Ser: 0.77 mg/dL (ref 0.44–1.00)
Glucose, Bld: 114 mg/dL — ABNORMAL HIGH (ref 65–99)
POTASSIUM: 3.1 mmol/L — AB (ref 3.5–5.1)
Sodium: 138 mmol/L (ref 135–145)
TOTAL PROTEIN: 7.7 g/dL (ref 6.5–8.1)

## 2016-11-18 LAB — PROTIME-INR
INR: 0.98
PROTHROMBIN TIME: 13 s (ref 11.4–15.2)

## 2016-11-18 LAB — APTT: aPTT: 27 seconds (ref 24–36)

## 2016-11-18 LAB — SEDIMENTATION RATE: SED RATE: 18 mm/h (ref 0–30)

## 2016-11-18 LAB — C-REACTIVE PROTEIN: CRP: 1.7 mg/dL — AB (ref <1.0)

## 2016-11-18 NOTE — Patient Instructions (Signed)
Your procedure is scheduled on: Monday 6/18/1/8 Report to [place]. 2ND FLOOR MEDICAL MALL ENTRANCE. To find out your arrival time please call [phone] between 1PM - 3PM on Friday 11/28/16.  Remember: Instructions that are not followed completely may result in serious medical risk, up to and including death, or upon the discretion of your surgeon and anesthesiologist your surgery may need to be rescheduled.    __X__ 1. Do not eat food or drink liquids after midnight. No gum chewing or hard candies.     __X__ 2. No Alcohol for 24 hours before or after surgery.   ____ 3. Bring all medications with you on the day of surgery if instructed.    __X__ 4. Notify your doctor if there is any change in your medical condition     (cold, fever, infections).             __X___5. No smoking within 24 hours of your surgery.     Do not wear jewelry, make-up, hairpins, clips or nail polish.  Do not wear lotions, powders, or perfumes.   Do not shave 48 hours prior to surgery. Men may shave face and neck.  Do not bring valuables to the hospital.    [facility] is not responsible for any belongings or valuables.               Contacts, dentures or bridgework may not be worn into surgery.  Leave your suitcase in the car. After surgery it may be brought to your room.  For patients admitted to the hospital, discharge time is determined by your                treatment team.   Patients discharged the day of surgery will not be allowed to drive home.   Please read over the following fact sheets that you were given:   MRSA Information   __X__ Take these medicines the morning of surgery with A SIP OF WATER:    1. GABAPENTIN  2.   3.   4.  5.  6.  ____ Fleet Enema (as directed)   __X__ Use CHG Soap as directed  __X__ Use inhalers on the day of surgery  ____ Stop metformin 2 days prior to surgery    ____ Take 1/2 of usual insulin dose the night before surgery and none on the morning of  surgery.   __X__ Stop Coumadin/Plavix/aspirin on STATED NOT CURRENTLY TAKING  __X__ Stop Anti-inflammatories such as Advil, Aleve, Ibuprofen, Motrin, Naproxen, Naprosyn, Goodies,powder, or aspirin products.  OK to take Tylenol.   ____ Stop supplements until after surgery.    ____ Bring C-Pap to the hospital.

## 2016-11-19 LAB — URINE CULTURE: SPECIAL REQUESTS: NORMAL

## 2016-11-19 LAB — SURGICAL PATHOLOGY

## 2016-11-21 ENCOUNTER — Other Ambulatory Visit: Payer: Self-pay

## 2016-11-21 DIAGNOSIS — E876 Hypokalemia: Secondary | ICD-10-CM

## 2016-11-21 MED ORDER — POTASSIUM CHLORIDE CRYS ER 20 MEQ PO TBCR: 20.0000 meq | EXTENDED_RELEASE_TABLET | Freq: Two times a day (BID) | ORAL | 0 refills | Status: DC

## 2016-11-21 NOTE — Progress Notes (Signed)
Patient advised per [name] labs done at [facility] for pre-op showed Potassium level is low at 3.1  Per [name] sent Klor-Con 30 meq BID #30 to [facility]. Patient advised to call back in 1 week to request lab slip for repeat labs. Lab ordered as future order. Just needs to be released when patient calls.

## 2016-11-25 ENCOUNTER — Ambulatory Visit (INDEPENDENT_AMBULATORY_CARE_PROVIDER_SITE_OTHER): Payer: Medicare Other | Admitting: General Surgery

## 2016-11-25 ENCOUNTER — Other Ambulatory Visit: Payer: Self-pay

## 2016-11-25 ENCOUNTER — Encounter: Payer: Self-pay | Admitting: General Surgery

## 2016-11-25 VITALS — BP 158/90 | HR 101 | Temp 98.2°F | Ht 63.0 in | Wt 226.0 lb

## 2016-11-25 DIAGNOSIS — R928 Other abnormal and inconclusive findings on diagnostic imaging of breast: Secondary | ICD-10-CM | POA: Diagnosis not present

## 2016-11-25 NOTE — Patient Instructions (Addendum)
We will see you back in 6 months with a Bilateral Mammogram. Your mammogram has been scheduled for 05/25/17 at 10:20am at [facility]. Your follow-up appointment with Dr. Adonis Huguenin is on 05/27/17 at 0900am in our [place] office.  Please continue to do your monthly exams and call with any questions or concerns that you have prior to your next appointment.    Breast Self-Awareness Breast self-awareness means being familiar with how your breasts look and feel. It involves checking your breasts regularly and reporting any changes to your health care provider. Practicing breast self-awareness is important. A change in your breasts can be a sign of a serious medical problem. Being familiar with how your breasts look and feel allows you to find any problems early, when treatment is more likely to be successful. All women should practice breast self-awareness, including women who have had breast implants.  How to do a breast self-exam One way to learn what is normal for your breasts and whether your breasts are changing is to do a breast self-exam. To do a breast self-exam: Look for Changes  1. Remove all the clothing above your waist. 2. Stand in front of a mirror in a room with good lighting. 3. Put your hands on your hips. 4. Push your hands firmly downward. 5. Compare your breasts in the mirror. Look for differences between them (asymmetry), such as: ? Differences in shape. ? Differences in size. ? Puckers, dips, and bumps in one breast and not the other. 6. Look at each breast for changes in your skin, such as: ? Redness. ? Scaly areas. 7. Look for changes in your nipples, such as: ? Discharge. ? Bleeding. ? Dimpling. ? Redness. ? A change in position. Feel for Changes  Carefully feel your breasts for lumps and changes. It is best to do this while lying on your back on the floor and again while sitting or standing in the shower or tub with soapy water on your skin. Feel each  breast in the following way:  Place the arm on the side of the breast you are examining above your head.  Feel your breast with the other hand.  Start in the nipple area and make  inch (2 cm) overlapping circles to feel your breast. Use the pads of your three middle fingers to do this. Apply light pressure, then medium pressure, then firm pressure. The light pressure will allow you to feel the tissue closest to the skin. The medium pressure will allow you to feel the tissue that is a little deeper. The firm pressure will allow you to feel the tissue close to the ribs.  Continue the overlapping circles, moving downward over the breast until you feel your ribs below your breast.  Move one finger-width toward the center of the body. Continue to use the  inch (2 cm) overlapping circles to feel your breast as you move slowly up toward your collarbone.  Continue the up and down exam using all three pressures until you reach your armpit.  Write Down What You Find  Write down what is normal for each breast and any changes that you find. Keep a written record with breast changes or normal findings for each breast. By writing this information down, you do not need to depend only on memory for size, tenderness, or location. Write down where you are in your menstrual cycle, if you are still menstruating. If you are having trouble noticing differences in your breasts, do not get discouraged.  With time you will become more familiar with the variations in your breasts and more comfortable with the exam. How often should I examine my breasts? Examine your breasts every month. If you are breastfeeding, the best time to examine your breasts is after a feeding or after using a breast pump. If you menstruate, the best time to examine your breasts is 5-7 days after your period is over. During your period, your breasts are lumpier, and it may be more difficult to notice changes. When should I see my health care  provider? See your health care provider if you notice:  A change in shape or size of your breasts or nipples.  A change in the skin of your breast or nipples, such as a reddened or scaly area.  Unusual discharge from your nipples.  A lump or thick area that was not there before.  Pain in your breasts.  Anything that concerns you.  This information is not intended to replace advice given to you by your health care provider. Make sure you discuss any questions you have with your health care provider. Document Released: 06/02/2005 Document Revised: 11/08/2015 Document Reviewed: 04/22/2015 Elsevier Interactive Patient Education  [employer].

## 2016-11-25 NOTE — Progress Notes (Signed)
Outpatient Surgical Follow Up  11/25/2016  [name] is an 66 y.o. female.   Chief Complaint  Patient presents with  . Follow-up    6 Month Mammogram  . Results    Breast Biopsy    HPI: 66 year old female returns to clinic for follow-up after recent mammogram found new lesion in the left breast. Here for biopsy results. Has complaints of pain lateral to the biopsy site where there is been skin excoriation from the radiology machine. Otherwise she denies any complaints. She denies any fevers, chills, nausea or vomiting, chest pain, shortness of breath, diarrhea, constipation. She continues to perform self breast exams and has not had any cell findings.  Past Medical History:  Diagnosis Date  . Anxiety   . Arthritis   . Asthma   . Bronchitis 09/04/2016   urgent care  . Diabetes mellitus without complication ([place])   . GERD (gastroesophageal reflux disease)   . History of shingles   . Hyperlipidemia   . Hypertension   . Neuralgia    Right side of head and face  . Shingles    chronic on Rt side of head and face    Past Surgical History:  Procedure Laterality Date  . ABDOMINAL HYSTERECTOMY    . BREAST BIOPSY Right 2016   benign  . BREAST BIOPSY Right 04/08/2016   neg- benign  . BREAST LUMPECTOMY Right 03/2016  . BREAST LUMPECTOMY WITH NEEDLE LOCALIZATION Right 05/02/2016   Procedure: BREAST LUMPECTOMY WITH NEEDLE LOCALIZATION;  Surgeon: Clayburn Pert, MD;  Location: [facility] ORS;  Service: General;  Laterality: Right;  . CHOLECYSTECTOMY    . TOTAL KNEE ARTHROPLASTY Right 2014  . TOTAL KNEE ARTHROPLASTY Right 02/16/2013    Family History  Problem Relation Age of Onset  . Anuerysm Mother        Brain  . Dementia Mother   . Heart failure Father   . CAD Father   . Dementia Father   . Diabetes Father   . Lupus Sister   . Diabetes Brother   . Throat cancer Maternal Grandfather   . Breast cancer Neg Hx     Social History:  reports that she has never smoked. She has  never used smokeless tobacco. She reports that she drinks alcohol. She reports that she does not use drugs.  Allergies:  Allergies  Allergen Reactions  . Lisinopril Cough  . Lovastatin Other (See Comments)    Myalgia  . Shellfish Allergy Rash    Medications reviewed.    ROS A multipoint review of systems was completed. All pertinent positives and negatives are documented within the history of present illness and remainder are negative   BP (!) 158/90   Pulse (!) 101   Temp 98.2 F (36.8 C) (Oral)   Ht 5\' 3"  (1.6 m)   Wt 102.5 kg (226 lb)   BMI 40.03 kg/m   Physical Exam Gen.: No acute distress Neck: Supple and nontender Lymph nodes: No evidence of cervical, clavicular, axillary lymphadenopathy Breast: Bilateral breast exam. Right breast with a well-healed incision site. Left breast with a well-healed biopsy site. There is a 10 cm linear abrasion lateral to this consistent with the edge of the machine. Chest: Clear to auscultation Heart: Regular rhythm Abdomen: Soft and nontender    No results found for this or any previous visit (from the past 48 hour(s)). No results found.  Assessment/Plan:  1. Abnormal mammogram of both breasts 66 year old female now with 9 biopsies from both breasts. Given  the interval timing of new findings on mammograms discussed that we would repeat her images in 6 months for follow-up. Again counseled about self breast exams. Patient voiced understanding and will follow-up after her images again in 6 months or sooner should she have any concerning findings. The abrasion to the left breast was treated with bacitracin and Telfa. She will follow up for any signs of infection as well. - US BREAST LTD UNI LEFT INC AXILLA; Future - US BREAST LTD UNI RIGHT INC AXILLA; Future - MM DIAG BREAST TOMO BILATERAL; Future  A total of 15 minutes was used for this encounter. 50% of it used for counseling and coordination of care.   Clayburn Pert, MD  FACS General Surgeon  11/25/2016,5:30 PM

## 2016-11-26 ENCOUNTER — Telehealth: Payer: Self-pay | Admitting: Family Medicine

## 2016-11-26 DIAGNOSIS — R921 Mammographic calcification found on diagnostic imaging of breast: Secondary | ICD-10-CM

## 2016-11-26 DIAGNOSIS — E876 Hypokalemia: Secondary | ICD-10-CM

## 2016-11-26 NOTE — Telephone Encounter (Signed)
Pt called stating she is needing to pick up a lab slip to have her potassium level recheck.  CB#[phone] or [phone]/MW

## 2016-11-27 NOTE — Telephone Encounter (Signed)
[name] I don't see where this was something from our office.  Please advised  ED

## 2016-11-27 NOTE — Telephone Encounter (Signed)
Potassium low on labs of 11-18-16 during a hospital encounter. Given KCL 20 meq BID and future order placed in chart 11-21-16 to be released for recheck prior to orthopedic surgery.

## 2016-11-28 DIAGNOSIS — E876 Hypokalemia: Secondary | ICD-10-CM | POA: Diagnosis not present

## 2016-11-28 NOTE — Telephone Encounter (Signed)
forwarded

## 2016-11-28 NOTE — Telephone Encounter (Signed)
Lab slip printed at front desk for pick up. Patient advised.

## 2016-11-29 LAB — COMPREHENSIVE METABOLIC PANEL
ALBUMIN: 4 g/dL (ref 3.6–4.8)
ALK PHOS: 77 IU/L (ref 39–117)
ALT: 11 IU/L (ref 0–32)
AST: 13 IU/L (ref 0–40)
Albumin/Globulin Ratio: 1.3 (ref 1.2–2.2)
BUN / CREAT RATIO: 13 (ref 12–28)
BUN: 11 mg/dL (ref 8–27)
Bilirubin Total: 0.4 mg/dL (ref 0.0–1.2)
CHLORIDE: 103 mmol/L (ref 96–106)
CO2: 23 mmol/L (ref 20–29)
CREATININE: 0.84 mg/dL (ref 0.57–1.00)
Calcium: 9.6 mg/dL (ref 8.7–10.3)
GFR calc Af Amer: 83 mL/min/{1.73_m2} (ref >59)
GFR calc non Af Amer: 72 mL/min/{1.73_m2} (ref >59)
GLOBULIN, TOTAL: 3 g/dL (ref 1.5–4.5)
Glucose: 89 mg/dL (ref 65–99)
POTASSIUM: 4 mmol/L (ref 3.5–5.2)
SODIUM: 143 mmol/L (ref 134–144)
Total Protein: 7 g/dL (ref 6.0–8.5)

## 2016-11-30 MED ORDER — TRANEXAMIC ACID 1000 MG/10ML IV SOLN
1000.0000 mg | INTRAVENOUS | Status: AC
  Administered 2016-12-01: 1000 mg via INTRAVENOUS
  Filled 2016-11-30: qty 10

## 2016-11-30 MED ORDER — CEFAZOLIN SODIUM-DEXTROSE 2-4 GM/100ML-% IV SOLN: 2.0000 g | INTRAVENOUS | Status: DC

## 2016-12-01 ENCOUNTER — Inpatient Hospital Stay: Payer: Medicare Other

## 2016-12-01 ENCOUNTER — Encounter
Admission: RE | Disposition: A | Discharge status: Home / Self Care | Payer: Self-pay | Source: Ambulatory Visit | Attending: Orthopedic Surgery

## 2016-12-01 ENCOUNTER — Inpatient Hospital Stay
Admission: RE | Admit: 2016-12-01 | Discharge: 2016-12-04 | DRG: 470 | Disposition: A | Discharge status: Home / Self Care | Payer: Medicare Other | Source: Ambulatory Visit | Attending: Orthopedic Surgery | Admitting: Orthopedic Surgery

## 2016-12-01 ENCOUNTER — Inpatient Hospital Stay: Payer: Medicare Other | Admitting: Certified Registered Nurse Anesthetist

## 2016-12-01 DIAGNOSIS — Z888 Allergy status to other drugs, medicaments and biological substances status: Secondary | ICD-10-CM | POA: Diagnosis not present

## 2016-12-01 DIAGNOSIS — E119 Type 2 diabetes mellitus without complications: Secondary | ICD-10-CM | POA: Diagnosis present

## 2016-12-01 DIAGNOSIS — Z79899 Other long term (current) drug therapy: Secondary | ICD-10-CM | POA: Diagnosis not present

## 2016-12-01 DIAGNOSIS — Z91013 Allergy to seafood: Secondary | ICD-10-CM

## 2016-12-01 DIAGNOSIS — Z6841 Body Mass Index (BMI) 40.0 and over, adult: Secondary | ICD-10-CM | POA: Diagnosis not present

## 2016-12-01 DIAGNOSIS — I10 Essential (primary) hypertension: Secondary | ICD-10-CM | POA: Diagnosis present

## 2016-12-01 DIAGNOSIS — Z471 Aftercare following joint replacement surgery: Secondary | ICD-10-CM | POA: Diagnosis not present

## 2016-12-01 DIAGNOSIS — Z7401 Bed confinement status: Secondary | ICD-10-CM | POA: Diagnosis not present

## 2016-12-01 DIAGNOSIS — F419 Anxiety disorder, unspecified: Secondary | ICD-10-CM | POA: Diagnosis present

## 2016-12-01 DIAGNOSIS — E669 Obesity, unspecified: Secondary | ICD-10-CM | POA: Diagnosis present

## 2016-12-01 DIAGNOSIS — Z96642 Presence of left artificial hip joint: Secondary | ICD-10-CM | POA: Diagnosis not present

## 2016-12-01 DIAGNOSIS — M25562 Pain in left knee: Secondary | ICD-10-CM | POA: Diagnosis not present

## 2016-12-01 DIAGNOSIS — M1712 Unilateral primary osteoarthritis, left knee: Principal | ICD-10-CM | POA: Diagnosis present

## 2016-12-01 DIAGNOSIS — Z96659 Presence of unspecified artificial knee joint: Secondary | ICD-10-CM

## 2016-12-01 DIAGNOSIS — J45909 Unspecified asthma, uncomplicated: Secondary | ICD-10-CM | POA: Diagnosis not present

## 2016-12-01 DIAGNOSIS — Z96652 Presence of left artificial knee joint: Secondary | ICD-10-CM

## 2016-12-01 HISTORY — PX: KNEE ARTHROPLASTY: SHX992

## 2016-12-01 LAB — GLUCOSE, CAPILLARY
Glucose-Capillary: 132 mg/dL — ABNORMAL HIGH (ref 65–99)
Glucose-Capillary: 119 mg/dL — ABNORMAL HIGH (ref 65–99)

## 2016-12-01 LAB — ABO/RH: ABO/RH(D): O POS

## 2016-12-01 LAB — POCT I-STAT 4, (NA,K, GLUC, HGB,HCT)
GLUCOSE: 128 mg/dL — AB (ref 65–99)
HEMATOCRIT: 41 % (ref 36.0–46.0)
Hemoglobin: 13.9 g/dL (ref 12.0–15.0)
Potassium: 3.4 mmol/L — ABNORMAL LOW (ref 3.5–5.1)
Sodium: 138 mmol/L (ref 135–145)

## 2016-12-01 SURGERY — ARTHROPLASTY, KNEE, TOTAL, USING IMAGELESS COMPUTER-ASSISTED NAVIGATION
Anesthesia: Spinal | Site: Knee | Laterality: Left | Wound class: Clean

## 2016-12-01 MED ORDER — LIDOCAINE HCL (CARDIAC) 20 MG/ML IV SOLN
INTRAVENOUS | Status: DC | PRN
  Administered 2016-12-01: 100 mg via INTRAVENOUS

## 2016-12-01 MED ORDER — AMLODIPINE BESYLATE 5 MG PO TABS
5.0000 mg | ORAL_TABLET | Freq: Every day | ORAL | Status: DC
  Administered 2016-12-02 – 2016-12-04 (×3): 5 mg via ORAL
  Filled 2016-12-01 (×3): qty 1

## 2016-12-01 MED ORDER — POTASSIUM CHLORIDE CRYS ER 20 MEQ PO TBCR
20.0000 meq | EXTENDED_RELEASE_TABLET | Freq: Two times a day (BID) | ORAL | Status: DC
  Administered 2016-12-01: 20 meq via ORAL
  Filled 2016-12-01: qty 1

## 2016-12-01 MED ORDER — KETAMINE HCL 50 MG/ML IJ SOLN
INTRAMUSCULAR | Status: AC
  Filled 2016-12-01: qty 10

## 2016-12-01 MED ORDER — TETRACAINE HCL 1 % IJ SOLN
INTRAMUSCULAR | Status: AC
  Filled 2016-12-01: qty 2

## 2016-12-01 MED ORDER — MENTHOL 3 MG MT LOZG
1.0000 | LOZENGE | OROMUCOSAL | Status: DC | PRN
  Filled 2016-12-01: qty 9

## 2016-12-01 MED ORDER — CEFAZOLIN SODIUM-DEXTROSE 2-4 GM/100ML-% IV SOLN
2.0000 g | Freq: Four times a day (QID) | INTRAVENOUS | Status: AC
  Administered 2016-12-01 – 2016-12-02 (×4): 2 g via INTRAVENOUS
  Filled 2016-12-01 (×5): qty 100

## 2016-12-01 MED ORDER — CHLORHEXIDINE GLUCONATE 4 % EX LIQD: 60.0000 mL | Freq: Once | CUTANEOUS | Status: DC

## 2016-12-01 MED ORDER — GLYCOPYRROLATE 0.2 MG/ML IJ SOLN
INTRAMUSCULAR | Status: DC | PRN
Start: 2016-12-01 — End: 2016-12-01
  Administered 2016-12-01: 0.2 mg via INTRAVENOUS

## 2016-12-01 MED ORDER — CEFAZOLIN SODIUM-DEXTROSE 2-3 GM-% IV SOLR
INTRAVENOUS | Status: DC | PRN
  Administered 2016-12-01: 2 g via INTRAVENOUS

## 2016-12-01 MED ORDER — NEOMYCIN-POLYMYXIN B GU 40-200000 IR SOLN
Status: DC | PRN
  Administered 2016-12-01: 14 mL

## 2016-12-01 MED ORDER — MIDAZOLAM HCL 2 MG/2ML IJ SOLN
INTRAMUSCULAR | Status: AC
  Filled 2016-12-01: qty 2

## 2016-12-01 MED ORDER — ONDANSETRON HCL 4 MG/2ML IJ SOLN: 4.0000 mg | Freq: Four times a day (QID) | INTRAMUSCULAR | Status: DC | PRN

## 2016-12-01 MED ORDER — GABAPENTIN 400 MG PO CAPS
800.0000 mg | ORAL_CAPSULE | Freq: Four times a day (QID) | ORAL | Status: DC
  Administered 2016-12-01 – 2016-12-04 (×11): 800 mg via ORAL
  Filled 2016-12-01 (×12): qty 2

## 2016-12-01 MED ORDER — ACETAMINOPHEN 325 MG PO TABS
650.0000 mg | ORAL_TABLET | Freq: Four times a day (QID) | ORAL | Status: DC | PRN
Start: 2016-12-01 — End: 2016-12-04
  Administered 2016-12-04: 650 mg via ORAL
  Filled 2016-12-01: qty 2

## 2016-12-01 MED ORDER — FLEET ENEMA 7-19 GM/118ML RE ENEM: 1.0000 | ENEMA | Freq: Once | RECTAL | Status: DC | PRN

## 2016-12-01 MED ORDER — ALBUTEROL SULFATE HFA 108 (90 BASE) MCG/ACT IN AERS: 2.0000 | INHALATION_SPRAY | RESPIRATORY_TRACT | Status: DC | PRN

## 2016-12-01 MED ORDER — HYDROMORPHONE HCL 1 MG/ML IJ SOLN
0.5000 mg | INTRAMUSCULAR | Status: AC | PRN
  Administered 2016-12-01 (×4): 0.5 mg via INTRAVENOUS

## 2016-12-01 MED ORDER — OXYCODONE HCL 5 MG PO TABS
5.0000 mg | ORAL_TABLET | ORAL | Status: DC | PRN
  Administered 2016-12-01: 10 mg via ORAL
  Administered 2016-12-01: 5 mg via ORAL
  Administered 2016-12-01 – 2016-12-03 (×6): 10 mg via ORAL
  Filled 2016-12-01: qty 2
  Filled 2016-12-01: qty 1
  Filled 2016-12-01 (×7): qty 2

## 2016-12-01 MED ORDER — BUPIVACAINE HCL (PF) 0.25 % IJ SOLN
INTRAMUSCULAR | Status: AC
  Filled 2016-12-01: qty 60

## 2016-12-01 MED ORDER — NEOMYCIN-POLYMYXIN B GU 40-200000 IR SOLN
Status: AC
  Filled 2016-12-01: qty 20

## 2016-12-01 MED ORDER — PHENOL 1.4 % MT LIQD
1.0000 | OROMUCOSAL | Status: DC | PRN
  Filled 2016-12-01: qty 177

## 2016-12-01 MED ORDER — CEFAZOLIN SODIUM-DEXTROSE 2-4 GM/100ML-% IV SOLN
INTRAVENOUS | Status: AC
  Filled 2016-12-01: qty 100

## 2016-12-01 MED ORDER — FENTANYL CITRATE (PF) 100 MCG/2ML IJ SOLN
INTRAMUSCULAR | Status: AC
  Filled 2016-12-01: qty 2

## 2016-12-01 MED ORDER — PROPOFOL 10 MG/ML IV BOLUS
INTRAVENOUS | Status: DC | PRN
  Administered 2016-12-01 (×2): 20 mg via INTRAVENOUS

## 2016-12-01 MED ORDER — VASOPRESSIN 20 UNIT/ML IV SOLN
INTRAVENOUS | Status: AC
  Filled 2016-12-01: qty 1

## 2016-12-01 MED ORDER — SODIUM CHLORIDE 0.9 % IV SOLN
INTRAVENOUS | Status: DC | PRN
  Administered 2016-12-01: 6 ug/kg/min via INTRAVENOUS

## 2016-12-01 MED ORDER — PROPOFOL 500 MG/50ML IV EMUL
INTRAVENOUS | Status: AC
  Filled 2016-12-01: qty 50

## 2016-12-01 MED ORDER — HYDROMORPHONE HCL 1 MG/ML IJ SOLN
INTRAMUSCULAR | Status: AC
  Filled 2016-12-01: qty 1

## 2016-12-01 MED ORDER — SENNOSIDES-DOCUSATE SODIUM 8.6-50 MG PO TABS
1.0000 | ORAL_TABLET | Freq: Two times a day (BID) | ORAL | Status: DC
  Administered 2016-12-01 – 2016-12-04 (×6): 1 via ORAL
  Filled 2016-12-01 (×6): qty 1

## 2016-12-01 MED ORDER — ALBUTEROL SULFATE (2.5 MG/3ML) 0.083% IN NEBU: 2.5000 mg | INHALATION_SOLUTION | RESPIRATORY_TRACT | Status: DC | PRN

## 2016-12-01 MED ORDER — TRAMADOL HCL 50 MG PO TABS
50.0000 mg | ORAL_TABLET | ORAL | Status: DC | PRN
  Administered 2016-12-01 – 2016-12-04 (×5): 100 mg via ORAL
  Filled 2016-12-01 (×5): qty 2

## 2016-12-01 MED ORDER — SODIUM CHLORIDE 0.9 % IV SOLN
INTRAVENOUS | Status: DC
  Administered 2016-12-01 (×3): via INTRAVENOUS

## 2016-12-01 MED ORDER — TRAZODONE HCL 50 MG PO TABS
50.0000 mg | ORAL_TABLET | Freq: Every evening | ORAL | Status: DC | PRN
  Administered 2016-12-04: 50 mg via ORAL
  Filled 2016-12-01: qty 1

## 2016-12-01 MED ORDER — MAGNESIUM HYDROXIDE 400 MG/5ML PO SUSP
30.0000 mL | Freq: Every day | ORAL | Status: DC | PRN
  Administered 2016-12-02: 30 mL via ORAL
  Filled 2016-12-01: qty 30

## 2016-12-01 MED ORDER — DEXAMETHASONE SODIUM PHOSPHATE 10 MG/ML IJ SOLN
INTRAMUSCULAR | Status: AC
  Filled 2016-12-01: qty 1

## 2016-12-01 MED ORDER — ACETAMINOPHEN 650 MG RE SUPP: 650.0000 mg | Freq: Four times a day (QID) | RECTAL | Status: DC | PRN

## 2016-12-01 MED ORDER — CELECOXIB 200 MG PO CAPS
200.0000 mg | ORAL_CAPSULE | Freq: Two times a day (BID) | ORAL | Status: DC
  Administered 2016-12-01 – 2016-12-04 (×6): 200 mg via ORAL
  Filled 2016-12-01 (×6): qty 1

## 2016-12-01 MED ORDER — FENTANYL CITRATE (PF) 100 MCG/2ML IJ SOLN
50.0000 ug | Freq: Once | INTRAMUSCULAR | Status: AC
  Administered 2016-12-01: 50 ug via INTRAVENOUS

## 2016-12-01 MED ORDER — ACETAMINOPHEN 10 MG/ML IV SOLN
INTRAVENOUS | Status: AC
  Filled 2016-12-01: qty 100

## 2016-12-01 MED ORDER — LIDOCAINE HCL (PF) 2 % IJ SOLN
INTRAMUSCULAR | Status: AC
  Filled 2016-12-01: qty 2

## 2016-12-01 MED ORDER — HYDROCHLOROTHIAZIDE 12.5 MG PO CAPS
25.0000 mg | ORAL_CAPSULE | Freq: Every evening | ORAL | Status: DC
  Administered 2016-12-01 – 2016-12-03 (×3): 25 mg via ORAL
  Filled 2016-12-01 (×4): qty 2

## 2016-12-01 MED ORDER — SODIUM CHLORIDE 0.9 % IV SOLN
INTRAVENOUS | Status: DC
  Administered 2016-12-01 – 2016-12-02 (×2): via INTRAVENOUS

## 2016-12-01 MED ORDER — PHENYLEPHRINE HCL 10 MG/ML IJ SOLN
INTRAMUSCULAR | Status: DC | PRN
  Administered 2016-12-01: 30 ug/min via INTRAVENOUS

## 2016-12-01 MED ORDER — BUPIVACAINE HCL (PF) 0.25 % IJ SOLN
INTRAMUSCULAR | Status: DC | PRN
  Administered 2016-12-01: 60 mL

## 2016-12-01 MED ORDER — SODIUM CHLORIDE 0.9 % IJ SOLN
INTRAMUSCULAR | Status: AC
  Filled 2016-12-01: qty 50

## 2016-12-01 MED ORDER — ENOXAPARIN SODIUM 30 MG/0.3ML ~~LOC~~ SOLN
30.0000 mg | Freq: Two times a day (BID) | SUBCUTANEOUS | Status: DC
  Administered 2016-12-02 – 2016-12-04 (×5): 30 mg via SUBCUTANEOUS
  Filled 2016-12-01 (×5): qty 0.3

## 2016-12-01 MED ORDER — DEXAMETHASONE SODIUM PHOSPHATE 4 MG/ML IJ SOLN
INTRAMUSCULAR | Status: DC | PRN
  Administered 2016-12-01: 5 mg via INTRAVENOUS

## 2016-12-01 MED ORDER — ACETAMINOPHEN 10 MG/ML IV SOLN
INTRAVENOUS | Status: DC | PRN
  Administered 2016-12-01: 1000 mg via INTRAVENOUS

## 2016-12-01 MED ORDER — PHENYLEPHRINE HCL 10 MG/ML IJ SOLN
INTRAMUSCULAR | Status: AC
  Filled 2016-12-01: qty 1

## 2016-12-01 MED ORDER — POLYVINYL ALCOHOL 1.4 % OP SOLN
Freq: Four times a day (QID) | OPHTHALMIC | Status: DC | PRN
  Filled 2016-12-01: qty 15

## 2016-12-01 MED ORDER — MORPHINE SULFATE (PF) 2 MG/ML IV SOLN
2.0000 mg | INTRAVENOUS | Status: DC | PRN
  Administered 2016-12-01: 2 mg via INTRAVENOUS
  Filled 2016-12-01: qty 1

## 2016-12-01 MED ORDER — ADULT MULTIVITAMIN W/MINERALS CH
1.0000 | ORAL_TABLET | Freq: Every day | ORAL | Status: DC
  Administered 2016-12-02 – 2016-12-04 (×3): 1 via ORAL
  Filled 2016-12-01 (×3): qty 1

## 2016-12-01 MED ORDER — PANTOPRAZOLE SODIUM 40 MG PO TBEC
40.0000 mg | DELAYED_RELEASE_TABLET | Freq: Two times a day (BID) | ORAL | Status: DC
  Administered 2016-12-01 – 2016-12-04 (×6): 40 mg via ORAL
  Filled 2016-12-01 (×6): qty 1

## 2016-12-01 MED ORDER — FAMOTIDINE 20 MG PO TABS
20.0000 mg | ORAL_TABLET | Freq: Once | ORAL | Status: AC
  Administered 2016-12-01: 20 mg via ORAL

## 2016-12-01 MED ORDER — BUPIVACAINE HCL (PF) 0.5 % IJ SOLN
INTRAMUSCULAR | Status: DC | PRN
  Administered 2016-12-01: 2 mL

## 2016-12-01 MED ORDER — TRANEXAMIC ACID 1000 MG/10ML IV SOLN
1000.0000 mg | Freq: Once | INTRAVENOUS | Status: AC
  Administered 2016-12-01: 1000 mg via INTRAVENOUS
  Filled 2016-12-01: qty 10

## 2016-12-01 MED ORDER — ONDANSETRON HCL 4 MG PO TABS: 4.0000 mg | ORAL_TABLET | Freq: Four times a day (QID) | ORAL | Status: DC | PRN

## 2016-12-01 MED ORDER — FAMOTIDINE 20 MG PO TABS
ORAL_TABLET | ORAL | Status: AC
  Administered 2016-12-01: 20 mg via ORAL
  Filled 2016-12-01: qty 1

## 2016-12-01 MED ORDER — ONDANSETRON HCL 4 MG/2ML IJ SOLN: 4.0000 mg | Freq: Once | INTRAMUSCULAR | Status: DC | PRN

## 2016-12-01 MED ORDER — FENTANYL CITRATE (PF) 100 MCG/2ML IJ SOLN
25.0000 ug | INTRAMUSCULAR | Status: DC | PRN
  Administered 2016-12-01: 50 ug via INTRAVENOUS
  Administered 2016-12-01: 25 ug via INTRAVENOUS
  Administered 2016-12-01: 50 ug via INTRAVENOUS
  Administered 2016-12-01: 25 ug via INTRAVENOUS

## 2016-12-01 MED ORDER — KETAMINE HCL 50 MG/ML IJ SOLN
INTRAMUSCULAR | Status: DC | PRN
  Administered 2016-12-01: 2 mg via INTRAMUSCULAR

## 2016-12-01 MED ORDER — FERROUS SULFATE 325 (65 FE) MG PO TABS
325.0000 mg | ORAL_TABLET | Freq: Two times a day (BID) | ORAL | Status: DC
  Administered 2016-12-01 – 2016-12-04 (×6): 325 mg via ORAL
  Filled 2016-12-01 (×6): qty 1

## 2016-12-01 MED ORDER — LABETALOL HCL 5 MG/ML IV SOLN
INTRAVENOUS | Status: AC
  Filled 2016-12-01: qty 4

## 2016-12-01 MED ORDER — GLYCOPYRROLATE 0.2 MG/ML IJ SOLN
INTRAMUSCULAR | Status: AC
  Filled 2016-12-01: qty 1

## 2016-12-01 MED ORDER — METOCLOPRAMIDE HCL 10 MG PO TABS
10.0000 mg | ORAL_TABLET | Freq: Three times a day (TID) | ORAL | Status: AC
  Administered 2016-12-01 – 2016-12-03 (×8): 10 mg via ORAL
  Filled 2016-12-01 (×8): qty 1

## 2016-12-01 MED ORDER — SODIUM CHLORIDE 0.9 % IV SOLN
INTRAVENOUS | Status: DC | PRN
  Administered 2016-12-01: 60 mL

## 2016-12-01 MED ORDER — FENTANYL CITRATE (PF) 100 MCG/2ML IJ SOLN
INTRAMUSCULAR | Status: DC | PRN
  Administered 2016-12-01: 100 ug via INTRAVENOUS

## 2016-12-01 MED ORDER — ACETAMINOPHEN 10 MG/ML IV SOLN
1000.0000 mg | Freq: Four times a day (QID) | INTRAVENOUS | Status: AC
  Administered 2016-12-01 – 2016-12-02 (×4): 1000 mg via INTRAVENOUS
  Filled 2016-12-01 (×4): qty 100

## 2016-12-01 MED ORDER — BISACODYL 10 MG RE SUPP: 10.0000 mg | Freq: Every day | RECTAL | Status: DC | PRN

## 2016-12-01 MED ORDER — PROPOFOL 500 MG/50ML IV EMUL
INTRAVENOUS | Status: DC | PRN
  Administered 2016-12-01: 60 ug/kg/min via INTRAVENOUS

## 2016-12-01 MED ORDER — VASOPRESSIN 20 UNIT/ML IV SOLN
INTRAVENOUS | Status: DC | PRN
  Administered 2016-12-01: 2 [IU] via INTRAVENOUS

## 2016-12-01 MED ORDER — MIDAZOLAM HCL 5 MG/5ML IJ SOLN
INTRAMUSCULAR | Status: DC | PRN
  Administered 2016-12-01 (×2): 1 mg via INTRAVENOUS

## 2016-12-01 MED ORDER — ALUM & MAG HYDROXIDE-SIMETH 200-200-20 MG/5ML PO SUSP: 30.0000 mL | ORAL | Status: DC | PRN

## 2016-12-01 MED ORDER — TETRACAINE HCL 1 % IJ SOLN
INTRAMUSCULAR | Status: DC | PRN
  Administered 2016-12-01: 10 mg via INTRASPINAL

## 2016-12-01 MED ORDER — GABAPENTIN 800 MG PO TABS
800.0000 mg | ORAL_TABLET | Freq: Four times a day (QID) | ORAL | Status: DC
  Filled 2016-12-01 (×2): qty 1

## 2016-12-01 MED ORDER — DIPHENHYDRAMINE HCL 12.5 MG/5ML PO ELIX: 12.5000 mg | ORAL_SOLUTION | ORAL | Status: DC | PRN

## 2016-12-01 MED ORDER — BUPIVACAINE LIPOSOME 1.3 % IJ SUSP
INTRAMUSCULAR | Status: AC
  Filled 2016-12-01: qty 20

## 2016-12-01 SURGICAL SUPPLY — 75 items
BATTERY INSTRU NAVIGATION (MISCELLANEOUS) ×12 IMPLANT
BLADE CLIPPER SURG (BLADE) ×2 IMPLANT
BLADE SAW 1 (BLADE) ×3 IMPLANT
BLADE SAW 1/2 (BLADE) ×3 IMPLANT
BLADE SAW 70X12.5 (BLADE) IMPLANT
BONE CEMENT GENTAMICIN (Cement) ×6 IMPLANT
BTRY SRG DRVR LF (MISCELLANEOUS) ×4
CANISTER SUCT 1200ML W/VALVE (MISCELLANEOUS) ×3 IMPLANT
CANISTER SUCT 3000ML PPV (MISCELLANEOUS) ×6 IMPLANT
CAP KNEE TOTAL 3 SIGMA ×2 IMPLANT
CATH TRAY METER 16FR LF (MISCELLANEOUS) ×3 IMPLANT
CEMENT BONE GENTAMICIN 40 (Cement) IMPLANT
COOLER POLAR GLACIER W/PUMP (MISCELLANEOUS) ×3 IMPLANT
CUFF TOURN 24 STER (MISCELLANEOUS) IMPLANT
CUFF TOURN 30 STER DUAL PORT (MISCELLANEOUS) ×2 IMPLANT
DRAPE SHEET LG 3/4 BI-LAMINATE (DRAPES) ×3 IMPLANT
DRSG DERMACEA 8X12 NADH (GAUZE/BANDAGES/DRESSINGS) ×3 IMPLANT
DRSG OPSITE POSTOP 4X14 (GAUZE/BANDAGES/DRESSINGS) ×3 IMPLANT
DRSG TEGADERM 4X4.75 (GAUZE/BANDAGES/DRESSINGS) ×3 IMPLANT
DURAPREP 26ML APPLICATOR (WOUND CARE) ×6 IMPLANT
ELECT CAUTERY BLADE 6.4 (BLADE) ×3 IMPLANT
ELECT REM PT RETURN 9FT ADLT (ELECTROSURGICAL) ×3
ELECTRODE REM PT RTRN 9FT ADLT (ELECTROSURGICAL) ×1 IMPLANT
EX-PIN ORTHOLOCK NAV 4X150 (PIN) ×6 IMPLANT
GLOVE BIO SURGEON STRL SZ 6.5 (GLOVE) ×1 IMPLANT
GLOVE BIO SURGEONS STRL SZ 6.5 (GLOVE) ×1
GLOVE BIOGEL M STRL SZ7.5 (GLOVE) ×6 IMPLANT
GLOVE BIOGEL PI IND STRL 7.0 (GLOVE) IMPLANT
GLOVE BIOGEL PI IND STRL 9 (GLOVE) ×1 IMPLANT
GLOVE BIOGEL PI INDICATOR 7.0 (GLOVE) ×6
GLOVE BIOGEL PI INDICATOR 9 (GLOVE) ×2
GLOVE INDICATOR 8.0 STRL GRN (GLOVE) ×5 IMPLANT
GLOVE SURG SYN 9.0  PF PI (GLOVE) ×2
GLOVE SURG SYN 9.0 PF PI (GLOVE) ×1 IMPLANT
GOWN STRL REUS W/ TWL LRG LVL3 (GOWN DISPOSABLE) ×2 IMPLANT
GOWN STRL REUS W/TWL 2XL LVL3 (GOWN DISPOSABLE) ×3 IMPLANT
GOWN STRL REUS W/TWL LRG LVL3 (GOWN DISPOSABLE) ×9
HEMOVAC 400CC 10FR (MISCELLANEOUS) ×3 IMPLANT
HOLDER FOLEY CATH W/STRAP (MISCELLANEOUS) ×3 IMPLANT
HOOD PEEL AWAY FLYTE STAYCOOL (MISCELLANEOUS) ×6 IMPLANT
KIT RM TURNOVER STRD PROC AR (KITS) ×3 IMPLANT
KNIFE SCULPS 14X20 (INSTRUMENTS) ×3 IMPLANT
LABEL OR SOLS (LABEL) ×3 IMPLANT
NDL SAFETY 18GX1.5 (NEEDLE) ×3 IMPLANT
NDL SPNL 20GX3.5 QUINCKE YW (NEEDLE) ×1 IMPLANT
NEEDLE SPNL 20GX3.5 QUINCKE YW (NEEDLE) ×3 IMPLANT
NS IRRIG 500ML POUR BTL (IV SOLUTION) ×3 IMPLANT
PACK TOTAL KNEE (MISCELLANEOUS) ×3 IMPLANT
PAD WRAPON POLAR KNEE (MISCELLANEOUS) ×1 IMPLANT
PAD WRAPON POLOR MULTI XL (MISCELLANEOUS) IMPLANT
PIN DRILL QUICK PACK ×3 IMPLANT
PIN FIXATION 1/8DIA X 3INL (PIN) ×3 IMPLANT
PULSAVAC PLUS IRRIG FAN TIP (DISPOSABLE) ×3
SOL .9 NS 3000ML IRR  AL (IV SOLUTION) ×2
SOL .9 NS 3000ML IRR AL (IV SOLUTION) ×1
SOL .9 NS 3000ML IRR UROMATIC (IV SOLUTION) ×1 IMPLANT
SOL PREP PVP 2OZ (MISCELLANEOUS) ×3
SOLUTION PREP PVP 2OZ (MISCELLANEOUS) ×1 IMPLANT
SPONGE DRAIN TRACH 4X4 STRL 2S (GAUZE/BANDAGES/DRESSINGS) ×3 IMPLANT
STAPLER SKIN PROX 35W (STAPLE) ×3 IMPLANT
STOCKINETTE M/LG 89821 (MISCELLANEOUS) ×2 IMPLANT
STRAP TIBIA SHORT (MISCELLANEOUS) ×3 IMPLANT
SUCTION FRAZIER HANDLE 10FR (MISCELLANEOUS) ×2
SUCTION TUBE FRAZIER 10FR DISP (MISCELLANEOUS) ×1 IMPLANT
SUT VIC AB 0 CT1 36 (SUTURE) ×5 IMPLANT
SUT VIC AB 1 CT1 36 (SUTURE) ×6 IMPLANT
SUT VIC AB 2-0 CT2 27 (SUTURE) ×3 IMPLANT
SYR 20CC LL (SYRINGE) ×3 IMPLANT
SYR 30ML LL (SYRINGE) ×6 IMPLANT
TIP FAN IRRIG PULSAVAC PLUS (DISPOSABLE) ×1 IMPLANT
TOWEL OR 17X26 4PK STRL BLUE (TOWEL DISPOSABLE) ×1 IMPLANT
TOWER CARTRIDGE SMART MIX (DISPOSABLE) ×3 IMPLANT
WRAP-ON POLOR PAD MULTI XL (MISCELLANEOUS) ×1
WRAPON POLAR PAD KNEE (MISCELLANEOUS)
WRAPON POLOR PAD MULTI XL (MISCELLANEOUS) ×3

## 2016-12-01 NOTE — Op Note (Signed)
OPERATIVE NOTE  DATE OF SURGERY:  12/01/2016  PATIENT NAME:  [name]   DOB: 1951/04/27  MRN: [id number]  PRE-OPERATIVE DIAGNOSIS: Degenerative arthrosis of the left knee, primary  POST-OPERATIVE DIAGNOSIS:  Same  PROCEDURE:  Left total knee arthroplasty using computer-assisted navigation  SURGEON:  Marciano Sequin. M.D.  ASSISTANT:  Vance Peper, PA (present and scrubbed throughout the case, critical for assistance with exposure, retraction, instrumentation, and closure)  ANESTHESIA: spinal  ESTIMATED BLOOD LOSS: 50 mL  FLUIDS REPLACED: 2100 mL of crystalloid  TOURNIQUET TIME: 99 minutes  DRAINS: 2 medium Hemovac drains  SOFT TISSUE RELEASES: Anterior cruciate ligament, posterior cruciate ligament, deep medial collateral ligament, patellofemoral ligament, and posterolateral corner  IMPLANTS UTILIZED: DePuy PFC Sigma size 2.5 posterior stabilized femoral component (cemented), size 2.5 MBT tibial component (cemented), 32 mm 3 peg oval dome patella (cemented), and a 10 mm stabilized rotating platform polyethylene insert.  INDICATIONS FOR SURGERY: [name] is a 65 y.o. year old female with a long history of progressive knee pain. X-rays demonstrated severe degenerative changes in tricompartmental fashion. The patient had not seen any significant improvement despite conservative nonsurgical intervention. After discussion of the risks and benefits of surgical intervention, the patient expressed understanding of the risks benefits and agree with plans for total knee arthroplasty.   The risks, benefits, and alternatives were discussed at length including but not limited to the risks of infection, bleeding, nerve injury, stiffness, blood clots, the need for revision surgery, cardiopulmonary complications, among others, and they were willing to proceed.  PROCEDURE IN DETAIL: The patient was brought into the operating room and, after adequate spinal anesthesia was achieved, a  tourniquet was placed on the patient's upper thigh. The patient's knee and leg were cleaned and prepped with alcohol and DuraPrep and draped in the usual sterile fashion. A "timeout" was performed as per usual protocol. The lower extremity was exsanguinated using an Esmarch, and the tourniquet was inflated to 300 mmHg. An anterior longitudinal incision was made followed by a standard mid vastus approach. The deep fibers of the medial collateral ligament were elevated in a subperiosteal fashion off of the medial flare of the tibia so as to maintain a continuous soft tissue sleeve. The patella was subluxed laterally and the patellofemoral ligament was incised. Inspection of the knee demonstrated severe degenerative changes with full-thickness loss of articular cartilage. Osteophytes were debrided using a rongeur. Anterior and posterior cruciate ligaments were excised. Two 4.0 mm Schanz pins were inserted in the femur and into the tibia for attachment of the array of trackers used for computer-assisted navigation. Hip center was identified using a circumduction technique. Distal landmarks were mapped using the computer. The distal femur and proximal tibia were mapped using the computer. The distal femoral cutting guide was positioned using computer-assisted navigation so as to achieve a 5 distal valgus cut. The femur was sized and it was felt that a size 2.5 femoral component was appropriate. A size 2.5 femoral cutting guide was positioned and the anterior cut was performed and verified using the computer. This was followed by completion of the posterior and chamfer cuts. Femoral cutting guide for the central box was then positioned in the center box cut was performed.  Attention was then directed to the proximal tibia. Medial and lateral menisci were excised. The extramedullary tibial cutting guide was positioned using computer-assisted navigation so as to achieve a 0 varus-valgus alignment and 0 posterior slope.  The cut was performed and verified using  the computer. The proximal tibia was sized and it was felt that a size 2.5 tibial tray was appropriate. Tibial and femoral trials were inserted followed by insertion of a 10 mm polyethylene insert. The knee was felt to be tight laterally. The trial components were removed and the knee was placed in full extension and distracted using the [place] retractors. The posterolateral corner was carefully released using combination of electrocautery and Metzenbaum scissors. The trial components were reinserted followed by placement of a 10 mm polyethylene insert. This allowed for excellent mediolateral soft tissue balancing both in flexion and in full extension. Finally, the patella was cut and prepared so as to accommodate a 32 mm 3 peg oval dome patella. A patella trial was placed and the knee was placed through a range of motion with excellent patellar tracking appreciated. The femoral trial was removed after debridement of posterior osteophytes. The central post-hole for the tibial component was reamed followed by insertion of a keel punch. Tibial trials were then removed. Cut surfaces of bone were irrigated with copious amounts of normal saline with antibiotic solution using pulsatile lavage and then suctioned dry. Polymethylmethacrylate cement with gentamicin was prepared in the usual fashion using a vacuum mixer. Cement was applied to the cut surface of the proximal tibia as well as along the undersurface of a size 2.5 MBT tibial component. Tibial component was positioned and impacted into place. Excess cement was removed using [employer] fast streamer. Cement was then applied to the cut surfaces of the femur as well as along the posterior flanges of the size 2.5 femoral component. The femoral component was positioned and impacted into place. Excess cement was removed using [employer] fast streamer. A 10 mm polyethylene trial was inserted and the knee was brought into full extension with steady  axial compression applied. Finally, cement was applied to the backside of a 32 mm 3 peg oval dome patella and the patellar component was positioned and patellar clamp applied. Excess cement was removed using [employer] fast streamer. After adequate curing of the cement, the tourniquet was deflated after a total tourniquet time of 99 minutes. Hemostasis was achieved using electrocautery. The knee was irrigated with copious amounts of normal saline with antibiotic solution using pulsatile lavage and then suctioned dry. 20 mL of 1.3% Exparel and 60 mL of 0.25% Marcaine in 40 mL of normal saline was injected along the posterior capsule, medial and lateral gutters, and along the arthrotomy site. A 10 mm stabilized rotating platform polyethylene insert was inserted and the knee was placed through a range of motion with excellent mediolateral soft tissue balancing appreciated and excellent patellar tracking noted. 2 medium drains were placed in the wound bed and brought out through separate stab incisions. The medial parapatellar portion of the incision was reapproximated using interrupted sutures of #1 Vicryl. Subcutaneous tissue was approximated in layers using first #0 Vicryl followed #2-0 Vicryl. The skin was approximated with skin staples. A sterile dressing was applied.  The patient tolerated the procedure well and was transported to the recovery room in stable condition.    James P. Holley Bouche., M.D.

## 2016-12-01 NOTE — Evaluation (Signed)
Physical Therapy Evaluation Patient Details Name: [name] MRN: [id number] DOB: 26-Jun-1950 Today's Date: 12/01/2016   History of Present Illness  Pt is a 66 y.o. female s/p L TKA 12/01/16.  Pt seen for PT initial evaluation on POD #0.  PMH includes DM, h/o shingles, htn.  Clinical Impression  Pt seen on POD #0 for PT evaluation.  Pt requesting pain meds prior to PT session and nursing notified.  Pt requiring min assist for bed mobility, min to mod assist to stand with RW, and min assist to take a few steps forward and backwards with RW.  Pt appearing anxious during session but also motivated to participate as much as she could.  Pain 8/10 L knee beginning and end of session (therapy session modified d/t pain; pt declined to sit in recliner chair today and requested back to bed instead d/t L knee pain)  Pt would benefit from skilled PT to address noted impairments and functional limitations (see below for any additional details).  Upon hospital discharge, recommend pt discharge to [place].    Follow Up Recommendations SNF    Equipment Recommendations  Rolling walker with 5" wheels    Recommendations for Other Services OT consult     Precautions / Restrictions Precautions Precautions: Knee;Fall Precaution Booklet Issued: Yes (comment) Required Braces or Orthoses: Knee Immobilizer - Left Knee Immobilizer - Left: Discontinue once straight leg raise with < 10 degree lag Restrictions Weight Bearing Restrictions: Yes LLE Weight Bearing: Weight bearing as tolerated      Mobility  Bed Mobility Overal bed mobility: Needs Assistance Bed Mobility: Supine to Sit;Sit to Supine     Supine to sit: Min assist;HOB elevated Sit to supine: Min assist;HOB elevated   General bed mobility comments: vc's for technique; assist for L LE; increased time to perform d/t L knee pain  Transfers Overall transfer level: Needs assistance Equipment used: Rolling walker (2 wheeled) Transfers: Sit to/from  Stand Sit to Stand: Min assist;Mod assist         General transfer comment: assist to initiate stand and vc's/assist to come to full upright posture; vc's for UE and LE placement  Ambulation/Gait Ambulation/Gait assistance: Min assist Ambulation Distance (Feet): 2 Feet Assistive device: Rolling walker (2 wheeled)   Gait velocity: decreased   General Gait Details: pt initially given vc's for weightshifting L/R in standing and then pt able to march in place x5 reps B LE's; pt ambulated 2 feet forward and then backward with vc's for technique and increasing UE support through RW; decreased stance time L LE; antalgic; no L knee buckling noted  Stairs            Wheelchair Mobility    Modified Rankin (Stroke Patients Only)       Balance Overall balance assessment: Needs assistance Sitting-balance support: Bilateral upper extremity supported;Feet supported Sitting balance-Leahy Scale: Fair Sitting balance - Comments: static sitting   Standing balance support: Bilateral upper extremity supported (on RW) Standing balance-Leahy Scale: Fair Standing balance comment: static standing                             Pertinent Vitals/Pain Pain Assessment: 0-10 Pain Score: 8  Pain Location: L knee Pain Descriptors / Indicators: Constant;Sore;Tender Pain Intervention(s): Limited activity within patient's tolerance;Monitored during session;Premedicated before session;Repositioned;Ice applied  Vitals (HR and O2 on 2 L via nasal cannula) stable and WFL throughout treatment session.    Home Living Family/patient expects to  be discharged to:: Private residence Living Arrangements: Children (Adopted 69 y.o. and 95 y.o. grandchildren)   Type of Home: House Home Access: Stairs to enter Entrance Stairs-Rails: None Entrance Stairs-Number of Steps: 2-3 Home Layout: One level;Laundry or work area in basement Merchant navy officer in basement)        Prior Function Level of Independence:  Independent               Journalist, newspaper        Extremity/Trunk Assessment   Upper Extremity Assessment Upper Extremity Assessment: Overall WFL for tasks assessed    Lower Extremity Assessment Lower Extremity Assessment: RLE deficits/detail;LLE deficits/detail RLE Deficits / Details: strength and ROM WFL LLE Deficits / Details: Hip flexion at least 3-/5; knee flexion/extension at least 2/5; DF at least 3/5 AROM.  Limited assessment d/t 8/10 L knee pain. LLE: Unable to fully assess due to pain    Cervical / Trunk Assessment Cervical / Trunk Assessment: Normal  Communication   Communication: No difficulties  Cognition Arousal/Alertness: Awake/alert Behavior During Therapy: WFL for tasks assessed/performed Overall Cognitive Status: Within Functional Limits for tasks assessed                                        General Comments General comments (skin integrity, edema, etc.): L knee wrap, polar care, hemovac, and foley in place upon PT entry.  Nursing cleared pt for participation in physical therapy.  Pt agreeable to PT session.    Exercises Total Joint Exercises Ankle Circles/Pumps: AROM;Strengthening;Both;10 reps;Supine Quad Sets: AROM;Strengthening;Both;10 reps;Supine Short Arc Quad: AAROM;Strengthening;Left;10 reps;Supine (limited ROM d/t L knee pain) Heel Slides: AAROM;Strengthening;Left;10 reps;Supine (limited ROM d/t L knee pain) Hip ABduction/ADduction: AAROM;Strengthening;Left;10 reps;Supine Straight Leg Raises: AAROM;Strengthening;Left;10 reps;Supine Goniometric ROM: L knee extension 8 degrees short of neutral semi-supine in bed; L knee flexion 68 degrees sitting edge of bed   Assessment/Plan    PT Assessment Patient needs continued PT services  PT Problem List Decreased strength;Decreased range of motion;Decreased activity tolerance;Decreased balance;Decreased mobility;Decreased knowledge of use of DME;Decreased knowledge of  precautions;Pain       PT Treatment Interventions DME instruction;Gait training;Stair training;Functional mobility training;Therapeutic activities;Therapeutic exercise;Balance training;Patient/family education    PT Goals (Current goals can be found in the Care Plan section)  Acute Rehab PT Goals Patient Stated Goal: to go home PT Goal Formulation: With patient Time For Goal Achievement: 12/15/16 Potential to Achieve Goals: Fair    Frequency BID   Barriers to discharge Decreased caregiver support      Co-evaluation               AM-PAC PT "6 Clicks" Daily Activity  Outcome Measure Difficulty turning over in bed (including adjusting bedclothes, sheets and blankets)?: Total Difficulty moving from lying on back to sitting on the side of the bed? : Total Difficulty sitting down on and standing up from a chair with arms (e.g., wheelchair, bedside commode, etc,.)?: Total Help needed moving to and from a bed to chair (including a wheelchair)?: A Lot Help needed walking in hospital room?: A Lot Help needed climbing 3-5 steps with a railing? : A Lot 6 Click Score: 9    End of Session Equipment Utilized During Treatment: Gait belt;Oxygen (2 L O2 via nasal cannula) Activity Tolerance: Patient limited by pain Patient left: in bed;with call bell/phone within reach;with bed alarm set;with SCD's reapplied (B heels elevated via towel rolls; polar care  in place and activated) Nurse Communication: Mobility status;Precautions;Weight bearing status (via white board) PT Visit Diagnosis: Muscle weakness (generalized) (M62.81);Difficulty in walking, not elsewhere classified (R26.2);Pain Pain - Right/Left: Left Pain - part of body: Knee    Time: 1540-1610 PT Time Calculation (min) (ACUTE ONLY): 30 min   Charges:   PT Evaluation $PT Eval Low Complexity: 1 Procedure PT Treatments $Therapeutic Exercise: 8-22 mins   PT G CodesLeitha [name], PT 12/01/16, 4:54  PM [phone]

## 2016-12-01 NOTE — H&P (Signed)
The patient has been re-examined, and the chart reviewed, and there have been no interval changes to the documented history and physical.    The risks, benefits, and alternatives have been discussed at length. The patient expressed understanding of the risks benefits and agreed with plans for surgical intervention.  Banesa Tristan P. Cerissa Zeiger, Jr. M.D.    

## 2016-12-01 NOTE — OR Nursing (Signed)
Verified with patient that none of her breast surgeries involved lymph nodes, patient denies a reports no restrictions.  Dr Marry Guan in to see patient this am made aware of area on breast from recent procedure.  Patient examined with no new orders.

## 2016-12-01 NOTE — Anesthesia Post-op Follow-up Note (Cosign Needed)
Anesthesia QCDR form completed.        

## 2016-12-01 NOTE — Anesthesia Procedure Notes (Signed)
Spinal  Patient location during procedure: OR Start time: 12/01/2016 7:31 AM End time: 12/01/2016 7:33 AM Staffing Anesthesiologist: Martha Clan Resident/CRNA: Rosaria Ferries, Khamani Fairley Performed: resident/CRNA  Preanesthetic Checklist Completed: patient identified, site marked, surgical consent, pre-op evaluation, timeout performed, IV checked, risks and benefits discussed and monitors and equipment checked Spinal Block Patient position: sitting Prep: ChloraPrep Patient monitoring: heart rate, continuous pulse ox, blood pressure and cardiac monitor Approach: midline Location: L4-5 Injection technique: single-shot Needle Needle type: Whitacre and Introducer  Needle gauge: 25 G Needle length: 9 cm Assessment Sensory level: T10 Additional Notes Negative paresthesia. Negative blood return. Positive free-flowing CSF. Expiration date of kit checked and confirmed. Patient tolerated procedure well, without complications.

## 2016-12-01 NOTE — Anesthesia Preprocedure Evaluation (Signed)
Anesthesia Evaluation  Patient identified by MRN, date of birth, ID band Patient awake    Reviewed: Allergy & Precautions, H&P , NPO status , Patient's Chart, lab work & pertinent test results, reviewed documented beta blocker date and time   History of Anesthesia Complications Negative for: history of anesthetic complications  Airway Mallampati: III  TM Distance: >3 FB Neck ROM: full    Dental  (+) Caps, Partial Upper, Missing, Dental Advidsory Given   Pulmonary neg shortness of breath, asthma , neg sleep apnea, neg COPD, neg recent URI,           Cardiovascular Exercise Tolerance: Good hypertension, (-) angina(-) CAD, (-) Past MI, (-) Cardiac Stents and (-) CABG (-) dysrhythmias (-) Valvular Problems/Murmurs     Neuro/Psych neg Seizures PSYCHIATRIC DISORDERS (Depression)  Neuromuscular disease    GI/Hepatic Neg liver ROS, GERD  ,  Endo/Other  diabetesMorbid obesity  Renal/GU negative Renal ROS  negative genitourinary   Musculoskeletal   Abdominal   Peds  Hematology negative hematology ROS (+)   Anesthesia Other Findings Past Medical History: No date: Anxiety No date: Arthritis No date: Asthma 09/04/2016: Bronchitis     Comment: urgent care No date: Diabetes mellitus without complication (HCC) No date: GERD (gastroesophageal reflux disease) No date: History of shingles No date: Hyperlipidemia No date: Hypertension No date: Neuralgia     Comment: Right side of head and face No date: Shingles     Comment: chronic on Rt side of head and face   Reproductive/Obstetrics negative OB ROS                             Anesthesia Physical Anesthesia Plan  ASA: III  Anesthesia Plan: Spinal   Post-op Pain Management:    Induction:   PONV Risk Score and Plan: 2 and Ondansetron and Propofol  Airway Management Planned:   Additional Equipment:   Intra-op Plan:   Post-operative Plan:    Informed Consent: I have reviewed the patients History and Physical, chart, labs and discussed the procedure including the risks, benefits and alternatives for the proposed anesthesia with the patient or authorized representative who has indicated his/her understanding and acceptance.   Dental Advisory Given  Plan Discussed with: Anesthesiologist, CRNA and Surgeon  Anesthesia Plan Comments:         Anesthesia Quick Evaluation

## 2016-12-01 NOTE — Transfer of Care (Signed)
Immediate Anesthesia Transfer of Care Note  Patient: [name]  Procedure(s) Performed: Procedure(s): COMPUTER ASSISTED TOTAL KNEE ARTHROPLASTY (Left)  Patient Location: PACU  Anesthesia Type:Spinal  Level of Consciousness: awake, alert  and patient cooperative  Airway & Oxygen Therapy: Patient Spontanous Breathing and Patient connected to nasal cannula oxygen  Post-op Assessment: Report given to RN and Post -op Vital signs reviewed and stable  Post vital signs: Reviewed and stable  Last Vitals:  Vitals:   12/01/16 0611  BP: (!) 143/79  Pulse: (!) 101  Resp: 16  Temp: 36.4 C    Last Pain:  Vitals:   12/01/16 0611  TempSrc: Tympanic  PainSc: 3          Complications: No apparent anesthesia complications

## 2016-12-01 NOTE — NC FL2 (Signed)
[place] LEVEL OF CARE SCREENING TOOL     IDENTIFICATION  Patient Name: [name] Birthdate: 14-Sep-1950 Sex: female Admission Date (Current Location): 12/01/2016  [place] and [place] Number:  Engineering geologist and Address:  [facility], [address] [place], [place] [address]      Provider Number: [id number]  Attending Physician Name and Address:  Dereck Leep, MD  Relative Name and Phone Number:       Current Level of Care: Hospital Recommended Level of Care: [place] Prior Approval Number:    Date Approved/Denied:   PASRR Number:  [id number] )  Discharge Plan: SNF    Current Diagnoses: Patient Active Problem List   Diagnosis Date Noted  . S/P total knee arthroplasty 12/01/2016  . Elevated C-reactive protein (CRP) 10/06/2016  . Hypokalemia 10/06/2016  . Primary osteoarthritis of left knee 09/07/2016  . Hypertension 07/24/2016  . Long term current use of opiate analgesic 07/24/2016  . Long term prescription opiate use 07/24/2016  . Opiate use 07/24/2016  . Breast mass, right 04/16/2016  . Encounter for long-term (current) use of medications 01/21/2016  . Disturbance of skin sensation 01/21/2016  . Occipital headache 01/21/2016  . Bilateral occipital neuralgia 01/21/2016  . Chronic Atypical facial pain (Location of Primary Source of Pain) (Right) 05/07/2015  . Chronic pain syndrome 05/07/2015  . Gasserian ganglionitis 03/12/2015  . Neuropathic pain 03/12/2015  . Herpes zoster ophthalmicus 03/12/2015  . Anxiety 01/11/2015  . Diabetes ([place]) 01/11/2015  . Photopsia 01/11/2015  . Avitaminosis A 01/11/2015  . Depression 01/10/2015  . Postherpetic neuralgia 11/22/2014  . Obesity (BMI 35.0-39.9 without comorbidity) ([place]) 04/18/2014  . Neuropathic postherpetic trigeminal neuralgia (Location of Primary Source of Pain) (Right) (V1) 12/21/2013  . Cannot sleep 03/13/2009  . Asthma 02/07/2009  .  Calcium deficiency disease 10/16/2008  . Hypercholesterolemia without hypertriglyceridemia 07/06/2008  . Avitaminosis D 07/05/2008  . History of colon polyps 10/17/2005    Orientation RESPIRATION BLADDER Height & Weight     Self, Time, Situation, Place  O2 (2 Liters Oxygen ) Continent Weight: 226 lb (102.5 kg) Height:  5\' 3"  (160 cm)  BEHAVIORAL SYMPTOMS/MOOD NEUROLOGICAL BOWEL NUTRITION STATUS   (none)  (none) Continent Diet (Diet: Clear Liquid to be Advanced. )  AMBULATORY STATUS COMMUNICATION OF NEEDS Skin   Extensive Assist Verbally Surgical wounds (Incision: Left Knee )                       Personal Care Assistance Level of Assistance  Bathing, Feeding, Dressing Bathing Assistance: Limited assistance Feeding assistance: Independent Dressing Assistance: Limited assistance     Functional Limitations Info  Sight, Hearing, Speech Sight Info: Adequate Hearing Info: Adequate Speech Info: Adequate    SPECIAL CARE FACTORS FREQUENCY  PT (By licensed PT), OT (By licensed OT)     PT Frequency:  (5) OT Frequency:  (5)            Contractures      Additional Factors Info  Code Status, Allergies Code Status Info:  (Full Code. ) Allergies Info:  (Lisinopril, Lovastatin, Shellfish Allergy)           Current Medications (12/01/2016):  This is the current hospital active medication list Current Facility-Administered Medications  Medication Dose Route Frequency Provider Last Rate Last Dose  . 0.9 %  sodium chloride infusion   Intravenous Continuous Hooten, Laurice Record, MD 100 mL/hr at 12/01/16 1315    .  acetaminophen (OFIRMEV) IV 1,000 mg  1,000 mg Intravenous Q6H Hooten, Laurice Record, MD 400 mL/hr at 12/01/16 1529 1,000 mg at 12/01/16 1529  . acetaminophen (TYLENOL) tablet 650 mg  650 mg Oral Q6H PRN Hooten, Laurice Record, MD       Or  . acetaminophen (TYLENOL) suppository 650 mg  650 mg Rectal Q6H PRN Hooten, Laurice Record, MD      . albuterol (PROVENTIL) (2.5 MG/3ML) 0.083% nebulizer  solution 2.5 mg  2.5 mg Nebulization Q4H PRN Hooten, Laurice Record, MD      . alum & mag hydroxide-simeth (MAALOX/MYLANTA) 200-200-20 MG/5ML suspension 30 mL  30 mL Oral Q4H PRN Hooten, Laurice Record, MD      . amLODipine (NORVASC) tablet 5 mg  5 mg Oral Daily Hooten, Laurice Record, MD      . bisacodyl (DULCOLAX) suppository 10 mg  10 mg Rectal Daily PRN Hooten, Laurice Record, MD      . ceFAZolin (ANCEF) 2-4 GM/100ML-% IVPB           . ceFAZolin (ANCEF) IVPB 2g/100 mL premix  2 g Intravenous Q6H Hooten, Laurice Record, MD      . celecoxib (CELEBREX) capsule 200 mg  200 mg Oral Q12H Hooten, Laurice Record, MD      . diphenhydrAMINE (BENADRYL) 12.5 MG/5ML elixir 12.5-25 mg  12.5-25 mg Oral Q4H PRN Hooten, Laurice Record, MD      . Derrill Memo ON 12/02/2016] enoxaparin (LOVENOX) injection 30 mg  30 mg Subcutaneous Q12H Hooten, Laurice Record, MD      . fentaNYL (SUBLIMAZE) 100 MCG/2ML injection           . fentaNYL (SUBLIMAZE) 100 MCG/2ML injection           . ferrous sulfate tablet 325 mg  325 mg Oral BID WC Hooten, Laurice Record, MD      . gabapentin (NEURONTIN) capsule 800 mg  800 mg Oral Q6H Hooten, Laurice Record, MD      . hydrochlorothiazide (MICROZIDE) capsule 25 mg  25 mg Oral QPM Hooten, Laurice Record, MD      . HYDROmorphone (DILAUDID) 1 MG/ML injection           . HYDROmorphone (DILAUDID) 1 MG/ML injection           . labetalol (NORMODYNE,TRANDATE) 5 MG/ML injection           . magnesium hydroxide (MILK OF MAGNESIA) suspension 30 mL  30 mL Oral Daily PRN Hooten, Laurice Record, MD      . menthol-cetylpyridinium (CEPACOL) lozenge 3 mg  1 lozenge Oral PRN Hooten, Laurice Record, MD       Or  . phenol (CHLORASEPTIC) mouth spray 1 spray  1 spray Mouth/Throat PRN Hooten, Laurice Record, MD      . metoCLOPramide (REGLAN) tablet 10 mg  10 mg Oral TID AC & HS Hooten, Laurice Record, MD      . morphine 2 MG/ML injection 2 mg  2 mg Intravenous Q2H PRN Hooten, Laurice Record, MD   2 mg at 12/01/16 1529  . multivitamin with minerals tablet 1 tablet  1 tablet Oral Daily Hooten, Laurice Record, MD      .  ondansetron (ZOFRAN) tablet 4 mg  4 mg Oral Q6H PRN Hooten, Laurice Record, MD       Or  . ondansetron (ZOFRAN) injection 4 mg  4 mg Intravenous Q6H PRN Hooten, Laurice Record, MD      . oxyCODONE (Oxy IR/ROXICODONE) immediate release tablet 5-10 mg  5-10 mg Oral Q4H PRN Dereck Leep, MD   5 mg at 12/01/16 1335  . pantoprazole (PROTONIX) EC tablet 40 mg  40 mg Oral BID Hooten, Laurice Record, MD      . polyvinyl alcohol (LIQUIFILM TEARS) 1.4 % ophthalmic solution   Both Eyes QID PRN Hooten, Laurice Record, MD      . potassium chloride SA (K-DUR,KLOR-CON) CR tablet 20 mEq  20 mEq Oral BID Hooten, Laurice Record, MD      . senna-docusate (Senokot-S) tablet 1 tablet  1 tablet Oral BID Hooten, Laurice Record, MD      . sodium phosphate (FLEET) 7-19 GM/118ML enema 1 enema  1 enema Rectal Once PRN Hooten, Laurice Record, MD      . traMADol (ULTRAM) tablet 50-100 mg  50-100 mg Oral Q4H PRN Hooten, Laurice Record, MD      . traZODone (DESYREL) tablet 50 mg  50 mg Oral QHS PRN Hooten, Laurice Record, MD         Discharge Medications: Please see discharge summary for a list of discharge medications.  Relevant Imaging Results:  Relevant Lab Results:   Additional Information  (SSN: [id number])  Sample, Veronia Beets, LCSW

## 2016-12-01 NOTE — OR Nursing (Signed)
Verified with patient okay to use betadine for prep.

## 2016-12-02 ENCOUNTER — Encounter: Payer: Self-pay | Admitting: Orthopedic Surgery

## 2016-12-02 LAB — GLUCOSE, CAPILLARY
GLUCOSE-CAPILLARY: 117 mg/dL — AB (ref 65–99)
GLUCOSE-CAPILLARY: 141 mg/dL — AB (ref 65–99)
GLUCOSE-CAPILLARY: 105 mg/dL — AB (ref 65–99)
Glucose-Capillary: 127 mg/dL — ABNORMAL HIGH (ref 65–99)

## 2016-12-02 LAB — BASIC METABOLIC PANEL
ANION GAP: 7 (ref 5–15)
BUN: 9 mg/dL (ref 6–20)
CHLORIDE: 100 mmol/L — AB (ref 101–111)
CO2: 26 mmol/L (ref 22–32)
Calcium: 7.9 mg/dL — ABNORMAL LOW (ref 8.9–10.3)
Creatinine, Ser: 0.68 mg/dL (ref 0.44–1.00)
GFR calc non Af Amer: >60 mL/min (ref >60)
Glucose, Bld: 166 mg/dL — ABNORMAL HIGH (ref 65–99)
POTASSIUM: 3.2 mmol/L — AB (ref 3.5–5.1)
SODIUM: 133 mmol/L — AB (ref 135–145)

## 2016-12-02 LAB — TYPE AND SCREEN
ABO/RH(D): O POS
ANTIBODY SCREEN: POSITIVE
PT AG Type: POSITIVE
UNIT DIVISION: 0
Unit division: 0

## 2016-12-02 LAB — CBC
HCT: 35.3 % (ref 35.0–47.0)
HEMOGLOBIN: 11.6 g/dL — AB (ref 12.0–16.0)
MCH: 27.3 pg (ref 26.0–34.0)
MCHC: 33 g/dL (ref 32.0–36.0)
MCV: 82.7 fL (ref 80.0–100.0)
Platelets: 235 10*3/uL (ref 150–440)
RBC: 4.26 MIL/uL (ref 3.80–5.20)
RDW: 13.6 % (ref 11.5–14.5)
WBC: 19.5 10*3/uL — AB (ref 3.6–11.0)

## 2016-12-02 LAB — BPAM RBC
Blood Product Expiration Date: 201806252359
Blood Product Expiration Date: 201807022359
UNIT TYPE AND RH: 5100
Unit Type and Rh: 5100

## 2016-12-02 MED ORDER — POTASSIUM CHLORIDE CRYS ER 20 MEQ PO TBCR
40.0000 meq | EXTENDED_RELEASE_TABLET | Freq: Two times a day (BID) | ORAL | Status: DC
  Administered 2016-12-02 – 2016-12-04 (×5): 40 meq via ORAL
  Filled 2016-12-02 (×5): qty 2

## 2016-12-02 MED ORDER — INSULIN ASPART 100 UNIT/ML ~~LOC~~ SOLN
0.0000 [IU] | Freq: Three times a day (TID) | SUBCUTANEOUS | Status: DC
  Administered 2016-12-02: 2 [IU] via SUBCUTANEOUS
  Administered 2016-12-03: 3 [IU] via SUBCUTANEOUS
  Administered 2016-12-03: 2 [IU] via SUBCUTANEOUS
  Filled 2016-12-02 (×3): qty 1

## 2016-12-02 MED ORDER — FLUCONAZOLE 50 MG PO TABS
50.0000 mg | ORAL_TABLET | Freq: Every day | ORAL | Status: DC
  Administered 2016-12-02: 50 mg via ORAL
  Filled 2016-12-02: qty 0.5

## 2016-12-02 NOTE — Progress Notes (Signed)
Physical Therapy Treatment Patient Details Name: [name] MRN: [id number] DOB: 1950/08/03 Today's Date: 12/02/2016    History of Present Illness Pt is a 66 y.o. female s/p L TKA 12/01/16.  PMH includes DM, h/o shingles, htn.    PT Comments    Pt ambulated 25 feet x2 with RW (limited distance d/t L knee pain and mild SOB).  Pain L knee 7/10 beginning of session and 8/10 end of session (nursing notified).  Will continue to progress pt with strengthening, L knee ROM, and progressing ambulation distance per pt tolerance.    Follow Up Recommendations  SNF     Equipment Recommendations  Rolling walker with 5" wheels    Recommendations for Other Services       Precautions / Restrictions Precautions Precautions: Knee;Fall Precaution Booklet Issued: Yes (comment) Required Braces or Orthoses: Knee Immobilizer - Left Knee Immobilizer - Left: Discontinue once straight leg raise with < 10 degree lag Restrictions Weight Bearing Restrictions: Yes LLE Weight Bearing: Weight bearing as tolerated    Mobility  Bed Mobility Overal bed mobility: Needs Assistance Bed Mobility: Sit to Supine       Sit to supine: Min assist;HOB elevated   General bed mobility comments: assist for L LE; increased effort to perform  Transfers Overall transfer level: Needs assistance Equipment used: Rolling walker (2 wheeled) Transfers: Sit to/from Stand Sit to Stand: Min assist         General transfer comment: assist to stand from recliner and bedside commode; vc's for hand and feet placement  Ambulation/Gait Ambulation/Gait assistance: Min guard;Min assist Ambulation Distance (Feet):  (25 feet x2 (to bathroom and back)) Assistive device: Rolling walker (2 wheeled) Gait Pattern/deviations: Step-to pattern;Decreased stance time - left;Antalgic Gait velocity: decreased   General Gait Details: vc's for gait technique and walker use; no knee buckling noted   Stairs             Wheelchair Mobility    Modified Rankin (Stroke Patients Only)       Balance Overall balance assessment: Needs assistance Sitting-balance support: Bilateral upper extremity supported;Feet supported Sitting balance-Leahy Scale: Good Sitting balance - Comments: sitting reaching within BOS   Standing balance support: Single extremity supported (standing toileting performing hygiene) Standing balance-Leahy Scale: Good Standing balance comment: no loss of balance with toileting hygiene                            Cognition Arousal/Alertness: Awake/alert Behavior During Therapy: WFL for tasks assessed/performed Overall Cognitive Status: Within Functional Limits for tasks assessed                                        Exercises Total Joint Exercises Long Arc Quad: AAROM;Strengthening;Left;10 reps;Seated Knee Flexion: AAROM;Strengthening;Left;10 reps;Seated General Exercises - Lower Extremity Hip Flexion/Marching: AAROM;Strengthening;Left;10 reps;Seated    General Comments General comments (skin integrity, edema, etc.): L knee wrap, polar care, hemovac in place upon PT entry.  Pt agreeable to PT session.      Pertinent Vitals/Pain Pain Assessment: 0-10 Pain Score: 8  Pain Location: L knee Pain Descriptors / Indicators: Constant;Sore;Tender Pain Intervention(s): Limited activity within patient's tolerance;Monitored during session;Premedicated before session;Repositioned;Ice applied (RN notified regarding pt's pain level)  Vitals (HR and O2 on room air) stable and WFL throughout treatment session.    Home Living  Prior Function            PT Goals (current goals can now be found in the care plan section) Acute Rehab PT Goals Patient Stated Goal: have less pain PT Goal Formulation: With patient Time For Goal Achievement: 12/15/16 Potential to Achieve Goals: Fair Additional Goals Additional Goal #1: L knee ROM  0-90 degrees. Progress towards PT goals: Progressing toward goals    Frequency    BID      PT Plan Current plan remains appropriate    Co-evaluation              AM-PAC PT "6 Clicks" Daily Activity  Outcome Measure  Difficulty turning over in bed (including adjusting bedclothes, sheets and blankets)?: Total Difficulty moving from lying on back to sitting on the side of the bed? : Total Difficulty sitting down on and standing up from a chair with arms (e.g., wheelchair, bedside commode, etc,.)?: Total Help needed moving to and from a bed to chair (including a wheelchair)?: A Little Help needed walking in hospital room?: A Little Help needed climbing 3-5 steps with a railing? : A Lot 6 Click Score: 11    End of Session Equipment Utilized During Treatment: Gait belt Activity Tolerance: Patient limited by pain Patient left: in bed;with call bell/phone within reach;with bed alarm set;with SCD's reapplied (Bone Foam elevating L heel; towel roll elevating R heel; polar care in place and activated) Nurse Communication: Mobility status;Precautions;Weight bearing status (Pt's pain status) PT Visit Diagnosis: Muscle weakness (generalized) (M62.81);Difficulty in walking, not elsewhere classified (R26.2);Pain Pain - Right/Left: Left Pain - part of body: Knee     Time: 1343-1415 PT Time Calculation (min) (ACUTE ONLY): 32 min  Charges:  $Gait Training: 8-22 mins $Therapeutic Activity: 8-22 mins                    G CodesLeitha Bleak, PT 12/02/16, 2:51 PM [phone]

## 2016-12-02 NOTE — Anesthesia Postprocedure Evaluation (Signed)
Anesthesia Post Note  Patient: [name]  Procedure(s) Performed: Procedure(s) (LRB): COMPUTER ASSISTED TOTAL KNEE ARTHROPLASTY (Left)  Patient location during evaluation: Nursing Unit Anesthesia Type: Spinal Level of consciousness: awake, awake and alert and oriented Pain management: pain level controlled Vital Signs Assessment: post-procedure vital signs reviewed and stable Respiratory status: spontaneous breathing Cardiovascular status: blood pressure returned to baseline Postop Assessment: no headache, no backache, no signs of nausea or vomiting and adequate PO intake Anesthetic complications: no     Last Vitals:  Vitals:   12/01/16 1818 12/02/16 0037  BP: 122/61 128/68  Pulse: (!) 103 98  Resp: 16   Temp: 36.5 C 36.7 C    Last Pain:  Vitals:   12/02/16 0612  TempSrc:   PainSc: 7                  Witt Plitt

## 2016-12-02 NOTE — Progress Notes (Signed)
Physical Therapy Treatment Patient Details Name: [name] MRN: [id number] DOB: August 29, 1950 Today's Date: 12/02/2016    History of Present Illness Pt is a 66 y.o. female s/p L TKA 12/01/16.  PMH includes DM, h/o shingles, htn.    PT Comments    Pt able to progress to ambulating 25 feet with RW min assist but distance limited d/t L knee pain.  Pt continues to be very motivated to participate in PT and do as much as she can.  Will continue to progress pt with strengthening, L knee ROM, and increasing ambulation distance per pt tolerance.    Follow Up Recommendations  SNF     Equipment Recommendations  Rolling walker with 5" wheels    Recommendations for Other Services OT consult     Precautions / Restrictions Precautions Precautions: Knee;Fall Precaution Booklet Issued: Yes (comment) Required Braces or Orthoses: Knee Immobilizer - Left Knee Immobilizer - Left: Discontinue once straight leg raise with < 10 degree lag Restrictions Weight Bearing Restrictions: Yes LLE Weight Bearing: Weight bearing as tolerated    Mobility  Bed Mobility Overal bed mobility: Needs Assistance Bed Mobility: Supine to Sit     Supine to sit: Min assist;HOB elevated     General bed mobility comments: vc's for technique; assist for L LE; increased time to perform d/t L knee pain  Transfers Overall transfer level: Needs assistance Equipment used: Rolling walker (2 wheeled) Transfers: Sit to/from Stand Sit to Stand: Min assist;Mod assist         General transfer comment: assist to initiate stand; vc's for UE and LE placement; increased effort to perform  Ambulation/Gait Ambulation/Gait assistance: Min assist Ambulation Distance (Feet): 25 Feet Assistive device: Rolling walker (2 wheeled) Gait Pattern/deviations: Step-to pattern;Decreased stance time - left;Antalgic Gait velocity: decreased   General Gait Details: vc's for gait technique and walker use; no knee buckling  noted   Stairs            Wheelchair Mobility    Modified Rankin (Stroke Patients Only)       Balance Overall balance assessment: Needs assistance Sitting-balance support: Bilateral upper extremity supported;Feet supported Sitting balance-Leahy Scale: Fair Sitting balance - Comments: static sitting   Standing balance support: Bilateral upper extremity supported (on RW) Standing balance-Leahy Scale: Fair Standing balance comment: static standing                            Cognition Arousal/Alertness: Awake/alert Behavior During Therapy: WFL for tasks assessed/performed Overall Cognitive Status: Within Functional Limits for tasks assessed                                        Exercises Total Joint Exercises Ankle Circles/Pumps: AROM;Strengthening;Both;10 reps;Supine Quad Sets: AROM;Strengthening;Both;10 reps;Supine Short Arc Quad: AAROM;Strengthening;Left;10 reps;Supine (limited ROM d/t L knee pain) Heel Slides: AAROM;Strengthening;Left;10 reps;Supine (limited ROM d/t L knee pain) Hip ABduction/ADduction: AAROM;Strengthening;Left;10 reps;Supine Straight Leg Raises: AAROM;Strengthening;Left;10 reps;Supine Goniometric ROM: L knee extension 6 degrees short of neutral semi-supine in bed; L knee flexion 76 degrees sitting edge of chair    General Comments General comments (skin integrity, edema, etc.): L knee wrap, polar care, hemovac in place upon PT entry.  Pt agreeable to PT session.      Pertinent Vitals/Pain Pain Assessment: 0-10 Pain Score: 7  Pain Location: L knee Pain Descriptors / Indicators: Constant;Sore;Tender Pain Intervention(s): Limited  activity within patient's tolerance;Monitored during session;Premedicated before session;Repositioned;Ice applied  Vitals (HR and O2 on room air) stable and WFL throughout treatment session.    Home Living                      Prior Function            PT Goals (current goals  can now be found in the care plan section) Acute Rehab PT Goals Patient Stated Goal: to go home PT Goal Formulation: With patient Time For Goal Achievement: 12/15/16 Potential to Achieve Goals: Fair Additional Goals Additional Goal #1: L knee ROM 0-90 degrees. Progress towards PT goals: Progressing toward goals    Frequency    BID      PT Plan Current plan remains appropriate    Co-evaluation              AM-PAC PT "6 Clicks" Daily Activity  Outcome Measure  Difficulty turning over in bed (including adjusting bedclothes, sheets and blankets)?: Total Difficulty moving from lying on back to sitting on the side of the bed? : Total Difficulty sitting down on and standing up from a chair with arms (e.g., wheelchair, bedside commode, etc,.)?: Total Help needed moving to and from a bed to chair (including a wheelchair)?: A Lot Help needed walking in hospital room?: A Lot Help needed climbing 3-5 steps with a railing? : A Lot 6 Click Score: 9    End of Session Equipment Utilized During Treatment: Gait belt Activity Tolerance: Patient limited by pain Patient left: in chair;with call bell/phone within reach;with chair alarm set;with SCD's reapplied (B heels elevated via towel rolls; polar care in place and activated) Nurse Communication: Mobility status;Precautions;Weight bearing status (via white board) PT Visit Diagnosis: Muscle weakness (generalized) (M62.81);Difficulty in walking, not elsewhere classified (R26.2);Pain Pain - Right/Left: Left Pain - part of body: Knee     Time: 1115-5208 PT Time Calculation (min) (ACUTE ONLY): 30 min  Charges:  $Therapeutic Exercise: 8-22 mins $Therapeutic Activity: 8-22 mins                    G CodesLeitha Bleak, PT 12/02/16, 9:23 AM [phone]

## 2016-12-02 NOTE — Clinical Social Work Note (Signed)
Clinical Social Work Assessment  Patient Details  Name: [name] MRN: [id number] Date of Birth: 1950/08/30  Date of referral:  12/02/16               Reason for consult:  Facility Placement                Permission sought to share information with:  Chartered certified accountant granted to share information::  Yes, Verbal Permission Granted  Name::      [place]::   [place]   Relationship::     Contact Information:     Housing/Transportation Living arrangements for the past 2 months:  [place] of Information:  Patient Patient Interpreter Needed:  None Criminal Activity/Legal Involvement Pertinent to Current Situation/Hospitalization:  No - Comment as needed Significant Relationships:  Adult Children, Other Family Members Lives with:  Minor Children Do you feel safe going back to the place where you live?  Yes Need for family participation in patient care:  Yes (Comment)  Care giving concerns:  Patient lives in [place] with her great grandchildren ages 29 and 49.    Social Worker assessment / plan:  Holiday representative (CSW) received SNF consult. PT is recommending SNF. CSW met with patient and she was sitting up in the chair at bedside. Patient was alert and oriented X4. CSW introduced self and explained role of CSW department. Patient reported that she lives in [place] and adopted her great grandchildren ages 77 and 64. Patient reported that her son Marlinda Mike is caring for the children while she is recovering from surgery. CSW explained that PT is recommending SNF and that medicare will pay for SNF if patient has a 3 night inpatient qualifying stay in a hospital. Patient was admitted to inpatient on 12/01/16. Patient verbalized her understanding and is agreeable to SNF search in [place]. FL2 complete and faxed out.   CSW presented bed offers to patient and she chose [facility]. Plan is for patient to D/C to  [facility] on Thursday 12/04/16 pending medical clearance. CSW sent [facility] admissions coordinator at [facility] a message making her aware of above. CSW will continue to follow and assist as needed.   Employment status:  Retired Forensic scientist:  Medicare PT Recommendations:  [place] / Referral to community resources:  [place]  Patient/Family's Response to care:  Patient is agreeable to D/C to [facility] for rehab.   Patient/Family's Understanding of and Emotional Response to Diagnosis, Current Treatment, and Prognosis:  Patient was very pleasant and thanked CSW for visit.   Emotional Assessment Appearance:  Appears stated age Attitude/Demeanor/Rapport:    Affect (typically observed):  Accepting, Adaptable, Pleasant Orientation:  Oriented to Self, Oriented to Place, Oriented to  Time, Oriented to Situation Alcohol / Substance use:  Not Applicable Psych involvement (Current and /or in the community):  No (Comment)  Discharge Needs  Concerns to be addressed:  Discharge Planning Concerns Readmission within the last 30 days:  No Current discharge risk:  Dependent with Mobility Barriers to Discharge:  Continued Medical Work up   [employer], Veronia Beets, LCSW 12/02/2016, 10:11 AM

## 2016-12-02 NOTE — Progress Notes (Signed)
   Subjective: 1 Day Post-Op Procedure(s) (LRB): COMPUTER ASSISTED TOTAL KNEE ARTHROPLASTY (Left) Patient reports pain as moderate.   Patient is well, and has had no acute complaints or problems We will start therapy today.  Plan is to go Home after hospital stay. no nausea and no vomiting Patient denies any chest pains or shortness of breath. Patient complaining of use of bone foam to operative leg. Objective: Vital signs in last 24 hours: Temp:  [97.2 F (36.2 C)-98.1 F (36.7 C)] 97.8 F (36.6 C) (06/19 0729) Pulse Rate:  [66-104] 98 (06/19 0729) Resp:  [11-34] 16 (06/19 0729) BP: (93-130)/(42-68) 130/67 (06/19 0729) SpO2:  [98 %-100 %] 100 % (06/19 0729) FiO2 (%):  [2 %] 2 % (06/18 1309) Weight:  [102.5 kg (226 lb)] 102.5 kg (226 lb) (06/18 1309) Heels are non tender and elevated off the bed using rolled towels as well as bone foam under the operative leg  Intake/Output from previous day: 06/18 0701 - 06/19 0700 In: 4728.3 [P.O.:50; I.V.:4078.3; IV Piggyback:600] Out: 4410 [Urine:4300; Drains:60; Blood:50] Intake/Output this shift: No intake/output data recorded.   Recent Labs  12/01/16 0634 12/02/16 0356  HGB 13.9 11.6*    Recent Labs  12/01/16 0634 12/02/16 0356  WBC  --  19.5*  RBC  --  4.26  HCT 41.0 35.3  PLT  --  235    Recent Labs  12/01/16 0634 12/02/16 0356  NA 138 133*  K 3.4* 3.2*  CL  --  100*  CO2  --  26  BUN  --  9  CREATININE  --  0.68  GLUCOSE 128* 166*  CALCIUM  --  7.9*   No results for input(s): LABPT, INR in the last 72 hours.  EXAM General - Patient is Alert, Appropriate and Oriented Extremity - Neurologically intact Neurovascular intact Sensation intact distally Intact pulses distally Dorsiflexion/Plantar flexion intact Compartment soft Dressing - dressing C/D/I Motor Function - intact, moving foot and toes well on exam.    Past Medical History:  Diagnosis Date  . Anxiety   . Arthritis   . Asthma   .  Bronchitis 09/04/2016   urgent care  . Diabetes mellitus without complication ([place])   . GERD (gastroesophageal reflux disease)   . History of shingles   . Hyperlipidemia   . Hypertension   . Neuralgia    Right side of head and face  . Shingles    chronic on Rt side of head and face    Assessment/Plan: 1 Day Post-Op Procedure(s) (LRB): COMPUTER ASSISTED TOTAL KNEE ARTHROPLASTY (Left) Active Problems:   S/P total knee arthroplasty  Estimated body mass index is 40.03 kg/m as calculated from the following:   Height as of this encounter: 5\' 3"  (1.6 m).   Weight as of this encounter: 102.5 kg (226 lb). Advance diet Up with therapy D/C IV fluids Plan for discharge tomorrow Discharge home with home health  Labs: Were reviewed. Potassium 3.2. Klor-Con ordered Labs tomorrow morning Begin working on bowel movement DVT Prophylaxis - Lovenox, Foot Pumps and TED hose Weight-Bearing as tolerated to left leg D/C O2 and Pulse OX and try on Room [employer] R. [place] 12/02/2016, 7:32 AM

## 2016-12-02 NOTE — Clinical Social Work Placement (Signed)
   CLINICAL SOCIAL WORK PLACEMENT  NOTE  Date:  12/02/2016  Patient Details  Name: [name] MRN: [id number] Date of Birth: 1951/06/14  Clinical Social Work is seeking post-discharge placement for this patient at the [place] level of care (*CSW will initial, date and re-position this form in  chart as items are completed):  Yes   Patient/family provided with [facility] Work Department's list of facilities offering this level of care within the geographic area requested by the patient (or if unable, by the patient's family).  Yes   Patient/family informed of their freedom to choose among providers that offer the needed level of care, that participate in Medicare, Medicaid or managed care program needed by the patient, have an available bed and are willing to accept the patient.  Yes   Patient/family informed of [facility]'s ownership interest in [facility] and [facility], as well as of the fact that they are under no obligation to receive care at these facilities.  PASRR submitted to EDS on       PASRR number received on       Existing PASRR number confirmed on 12/02/16     FL2 transmitted to all facilities in geographic area requested by pt/family on 12/02/16     FL2 transmitted to all facilities within larger geographic area on       Patient informed that his/her managed care company has contracts with or will negotiate with certain facilities, including the following:        Yes   Patient/family informed of bed offers received.  Patient chooses bed at  [facility] )     Physician recommends and patient chooses bed at      Patient to be transferred to   on  .  Patient to be transferred to facility by       Patient family notified on   of transfer.  Name of family member notified:        PHYSICIAN       Additional Comment:    _______________________________________________ Kimberle Stanfill, Veronia Beets, LCSW 12/02/2016,  10:10 AM

## 2016-12-02 NOTE — Discharge Summary (Signed)
Physician Discharge Summary  Patient ID: [name] MRN: [id number] DOB/AGE: [birth year] 68 y.o.  Admit date: 12/01/2016 Discharge date: 12/04/2016  Admission Diagnoses:  PRIMARY OSTEOARTHRITIS OF LEFT KNEE   Discharge Diagnoses: Patient Active Problem List   Diagnosis Date Noted  . S/P total knee arthroplasty 12/01/2016  . Elevated C-reactive protein (CRP) 10/06/2016  . Hypokalemia 10/06/2016  . Primary osteoarthritis of left knee 09/07/2016  . Hypertension 07/24/2016  . Long term current use of opiate analgesic 07/24/2016  . Long term prescription opiate use 07/24/2016  . Opiate use 07/24/2016  . Breast mass, right 04/16/2016  . Encounter for long-term (current) use of medications 01/21/2016  . Disturbance of skin sensation 01/21/2016  . Occipital headache 01/21/2016  . Bilateral occipital neuralgia 01/21/2016  . Chronic Atypical facial pain (Location of Primary Source of Pain) (Right) 05/07/2015  . Chronic pain syndrome 05/07/2015  . Gasserian ganglionitis 03/12/2015  . Neuropathic pain 03/12/2015  . Herpes zoster ophthalmicus 03/12/2015  . Anxiety 01/11/2015  . Diabetes ([place]) 01/11/2015  . Photopsia 01/11/2015  . Avitaminosis A 01/11/2015  . Depression 01/10/2015  . Postherpetic neuralgia 11/22/2014  . Obesity (BMI 35.0-39.9 without comorbidity) ([place]) 04/18/2014  . Neuropathic postherpetic trigeminal neuralgia (Location of Primary Source of Pain) (Right) (V1) 12/21/2013  . Cannot sleep 03/13/2009  . Asthma 02/07/2009  . Calcium deficiency disease 10/16/2008  . Hypercholesterolemia without hypertriglyceridemia 07/06/2008  . Avitaminosis D 07/05/2008  . History of colon polyps 10/17/2005    Past Medical History:  Diagnosis Date  . Anxiety   . Arthritis   . Asthma   . Bronchitis 09/04/2016   urgent care  . Diabetes mellitus without complication ([place])   . GERD (gastroesophageal reflux disease)   . History of shingles   . Hyperlipidemia   . Hypertension    . Neuralgia    Right side of head and face  . Shingles    chronic on Rt side of head and face     Transfusion: No transfusions during this admission   Consultants (if any):   Discharged Condition: Improved  Hospital Course: [name] is an 66 y.o. female who was admitted 12/01/2016 with a diagnosis of degenerative arthrosis left knee and went to the operating room on 12/01/2016 and underwent the above named procedures.    Surgeries:Procedure(s): COMPUTER ASSISTED TOTAL KNEE ARTHROPLASTY on 12/01/2016  PRE-OPERATIVE DIAGNOSIS: Degenerative arthrosis of the left knee, primary  POST-OPERATIVE DIAGNOSIS:  Same  PROCEDURE:  Left total knee arthroplasty using computer-assisted navigation  SURGEON:  Marciano Sequin. M.D.  ASSISTANT:  Vance Peper, PA (present and scrubbed throughout the case, critical for assistance with exposure, retraction, instrumentation, and closure)  ANESTHESIA: spinal  ESTIMATED BLOOD LOSS: 50 mL  FLUIDS REPLACED: 2100 mL of crystalloid  TOURNIQUET TIME: 99 minutes  DRAINS: 2 medium Hemovac drains  SOFT TISSUE RELEASES: Anterior cruciate ligament, posterior cruciate ligament, deep medial collateral ligament, patellofemoral ligament, and posterolateral corner  IMPLANTS UTILIZED: DePuy PFC Sigma size 2.5 posterior stabilized femoral component (cemented), size 2.5 MBT tibial component (cemented), 32 mm 3 peg oval dome patella (cemented), and a 10 mm stabilized rotating platform polyethylene insert.  INDICATIONS FOR SURGERY: [name] is a 66 y.o. year old female with a long history of progressive knee pain. X-rays demonstrated severe degenerative changes in tricompartmental fashion. The patient had not seen any significant improvement despite conservative nonsurgical intervention. After discussion of the risks and benefits of surgical intervention, the patient expressed understanding of the risks benefits  and agree with plans for total knee  arthroplasty.   The risks, benefits, and alternatives were discussed at length including but not limited to the risks of infection, bleeding, nerve injury, stiffness, blood clots, the need for revision surgery, cardiopulmonary complications, among others, and they were willing to proceed.  Patient tolerated the surgery well. No complications .Patient was taken to PACU where she was stabilized and then transferred to the orthopedic floor.  Patient started on Lovenox 30 mg q 12 hrs. Foot pumps applied bilaterally at 80 mm hgb. Heels elevated off bed with rolled towels. No evidence of DVT. Calves non tender. Negative [place]. Physical therapy started on day #1 for gait training and transfer with OT starting on  day #1 for ADL and assisted devices. Patient has done well with therapy. Ambulated 80 feet upon being discharged.  Patient's IV And Foley were discontinued on day #1 with Hemovac being discontinued on day #2. Dressing was changed on day 2 prior to patient being discharged   She was given perioperative antibiotics:  Anti-infectives    Start     Dose/Rate Route Frequency Ordered Stop   12/01/16 1400  ceFAZolin (ANCEF) IVPB 2g/100 mL premix     2 g 200 mL/hr over 30 Minutes Intravenous Every 6 hours 12/01/16 1304 12/02/16 1359   12/01/16 0550  ceFAZolin (ANCEF) 2-4 GM/100ML-% IVPB    Comments:  KENNEDY, ASHLEY: cabinet override      12/01/16 0550 12/01/16 1759   12/01/16 0000  ceFAZolin (ANCEF) IVPB 2g/100 mL premix  Status:  Discontinued     2 g 200 mL/hr over 30 Minutes Intravenous On call to O.R. 11/30/16 2347 12/01/16 0559    .  She was fitted with AV 1 compression foot pump devices, instructed on heel pumps, early ambulation, and fitted with TED stockings bilaterally for DVT prophylaxis.  She benefited maximally from the hospital stay and there were no complications.    Recent vital signs:  Vitals:   12/02/16 0037 12/02/16 0729  BP: 128/68 130/67  Pulse: 98 98  Resp:  16   Temp: 98.1 F (36.7 C) 97.8 F (36.6 C)    Recent laboratory studies:  Lab Results  Component Value Date   HGB 11.6 (L) 12/02/2016   HGB 13.9 12/01/2016   HGB 13.9 11/18/2016   Lab Results  Component Value Date   WBC 19.5 (H) 12/02/2016   PLT 235 12/02/2016   Lab Results  Component Value Date   INR 0.98 11/18/2016   Lab Results  Component Value Date   NA 133 (L) 12/02/2016   K 3.2 (L) 12/02/2016   CL 100 (L) 12/02/2016   CO2 26 12/02/2016   BUN 9 12/02/2016   CREATININE 0.68 12/02/2016   GLUCOSE 166 (H) 12/02/2016    Discharge Medications:   Allergies as of 12/04/2016      Reactions   Lisinopril Cough   Lovastatin Other (See Comments)   Myalgia   Shellfish Allergy Rash      Medication List    STOP taking these medications   aspirin EC 81 MG tablet     TAKE these medications   albuterol 108 (90 Base) MCG/ACT inhaler Commonly known as:  PROVENTIL HFA;VENTOLIN HFA Inhale 2 puffs into the lungs every 4 (four) hours as needed for wheezing or shortness of breath.   amLODipine 5 MG tablet Commonly known as:  NORVASC Take 1 tablet (5 mg total) by mouth daily. What changed:  when to take this   enoxaparin  30 MG/0.3ML injection Commonly known as:  LOVENOX Inject 0.4 mLs (40 mg total) into the skin daily.   gabapentin 800 MG tablet Commonly known as:  NEURONTIN Take 1 tablet (800 mg total) by mouth every 6 (six) hours.   hydrochlorothiazide 12.5 MG capsule Commonly known as:  MICROZIDE Take 25 mg by mouth every evening.   lansoprazole 15 MG capsule Commonly known as:  PREVACID Take 15 mg by mouth daily as needed.   LUBRICANT EYE DROPS OP Place 1 drop into both eyes 4 (four) times daily as needed (FOR DRY EYES).   multivitamin with minerals Tabs tablet Take 1 tablet by mouth daily. WOMEN'S MULTIVITAMIN   NONFORMULARY OR COMPOUNDED ITEM Compounded cream: 6% Gabapentin, 2.5% Lidocaine, 0.5% Meloxicam, 5% Methocarbamol, 2.5% Prilocaine. Sig: Apply 1-2  gm(s) (2-4 pumps) to affected area, 3-4 times/day. (1 pump = 0.5 gm) Dispense: 120 gm Pump Bottle. Dispenser: 1 pump = 0.5 gm.   oxyCODONE 5 MG immediate release tablet Commonly known as:  Oxy IR/ROXICODONE Take 1-2 tablets (5-10 mg total) by mouth every 4 (four) hours as needed for severe pain or breakthrough pain.   potassium chloride SA 20 MEQ tablet Commonly known as:  K-DUR,KLOR-CON Take 1 tablet (20 mEq total) by mouth 2 (two) times daily.   traMADol 50 MG tablet Commonly known as:  ULTRAM Take 1-2 tablets (50-100 mg total) by mouth every 4 (four) hours as needed for moderate pain.   traZODone 50 MG tablet Commonly known as:  DESYREL Take 0.5-1 tablets (25-50 mg total) by mouth at bedtime as needed for sleep. What changed:  how much to take  reasons to take this            Durable Medical Equipment        Start     Ordered   12/01/16 1305  DME Walker rolling  Once    Question:  Patient needs a walker to treat with the following condition  Answer:  Total knee replacement status   12/01/16 1304   12/01/16 1305  DME Bedside commode  Once    Question:  Patient needs a bedside commode to treat with the following condition  Answer:  Total knee replacement status   12/01/16 1304      Diagnostic Studies: Dg Knee Left Port  Result Date: 12/01/2016 CLINICAL DATA:  Total knee arthroplasty EXAM: PORTABLE LEFT KNEE - 1-2 VIEW COMPARISON:  None. FINDINGS: Total knee arthroplasty is in place. Anatomic alignment. No breakage or loosening of the hardware. Surgical drains in place. IMPRESSION: Total knee arthroplasty anatomically aligned. Electronically Signed   By: Marybelle Killings M.D.   On: 12/01/2016 12:16   Mm Digital Diagnostic Unilat L  Result Date: 11/04/2016 CLINICAL DATA:  Patient returns after screening study for evaluation of left breast calcifications. History of right excisional biopsy following core biopsy showing atypical papillary neoplasm. On excision, no residual  atypia or malignancy was identified. EXAM: DIGITAL DIAGNOSTIC LEFT MAMMOGRAM WITH CAD COMPARISON:  10/21/2016 ACR Breast Density Category b: There are scattered areas of fibroglandular density. FINDINGS: Magnified views are performed of calcifications in the upper-outer quadrant of the left breast. On magnified views there is a group of coarse heterogeneous calcifications which measures 1.2 x 0.6 x 0.8 cm. Mammographic images were processed with CAD. IMPRESSION: Developing coarse heterogeneous calcifications in the upper-outer quadrant of the left breast warranting tissue diagnosis. RECOMMENDATION: Stereotactic guided core biopsy is recommended. I have discussed the findings and recommendations with the patient. Results were also  provided in writing at the conclusion of the visit. If applicable, a reminder letter will be sent to the patient regarding the next appointment. BI-RADS CATEGORY  4: Suspicious. Electronically Signed   By: Nolon Nations M.D.   On: 11/04/2016 09:45   Mm Clip Placement Left  Addendum Date: 11/20/2016   ADDENDUM REPORT: 11/20/2016 12:10 ADDENDUM: Pathology results from the stereotactic guided biopsy of calcifications in the upper-outer left breast demonstrate fibroadenomatous changes with stromal hyalinization and calcifications. This is concordant with the imaging findings. The patient has been notified of the results. She is doing well post biopsy. Annual routine screening mammography is recommended. The patient has been instructed to call the [facility] with any questions or concerns. Electronically Signed   By: Everlean Alstrom M.D.   On: 11/20/2016 12:10   Result Date: 11/20/2016 CLINICAL DATA:  Status post stereotactic biopsy earlier today for left breast calcifications. EXAM: DIAGNOSTIC LEFT MAMMOGRAM POST STEREOTACTIC BIOPSY COMPARISON:  Previous exam(s). FINDINGS: Mammographic images were obtained following stereotactic guided biopsy of calcifications within the upper-outer  quadrant of the left breast. At the conclusion of the procedure, a top hat shaped tissue marker was deployed in the biopsy cavity. Biopsy clip is displaced approximately 2 cm lateral to the site of the targeted calcifications and approximately 1 cm anterior-superior to the site of the targeted calcifications. IMPRESSION: Top hat shaped clip is displaced approximately 2 cm lateral to the site of the targeted left breast calcifications, and also approximately 1 cm anterior-superior to the biopsy site. A portion of the biopsied calcifications remain which could be used to guide preoperative localization if necessary. Final Assessment: Post Procedure Mammograms for Marker Placement Electronically Signed: By: Franki Cabot M.D. On: 11/18/2016 13:32   Mm Lt Breast Bx W Loc Dev 1st Lesion Image Bx Spec Stereo Guide  Result Date: 11/18/2016 CLINICAL DATA:  Patient with indeterminate left breast calcifications presents today for stereotactic biopsy. EXAM: LEFT BREAST STEREOTACTIC CORE NEEDLE BIOPSY COMPARISON:  Previous exams. FINDINGS: The patient and I discussed the procedure of stereotactic-guided biopsy including benefits and alternatives. We discussed the high likelihood of a successful procedure. We discussed the risks of the procedure including infection, bleeding, tissue injury, clip migration, and inadequate sampling. Informed written consent was given. The usual time out protocol was performed immediately prior to the procedure. Using sterile technique and 1% Lidocaine as local anesthetic, under stereotactic guidance, a 9 gauge vacuum assisted device was used to perform core needle biopsy of calcifications in the upper outer quadrant of the left breast using a lateral approach. Specimen radiograph was performed showing calcifications. Specimens with calcifications are identified for pathology. Lesion quadrant: Upper outer quadrant At the conclusion of the procedure, a top hat shaped tissue marker clip was  deployed into the biopsy cavity. Follow-up 2-view mammogram was performed and dictated separately. IMPRESSION: Stereotactic-guided biopsy of left breast calcifications. No apparent complications. Electronically Signed   By: Franki Cabot M.D.   On: 11/18/2016 13:13    Disposition: 01-Home or Self Care    Follow-up Information    Dereck Leep, MD On 01/13/2017.   Specialty:  Orthopedic Surgery Why:  at 10:45am Contact information: [place] [address] [phone]        Watt Climes, PA On 12/16/2016.   Specialty:  Physician Assistant Why:  at 1:15pm Contact information: [place] [address] [phone]  Signed: Estelene Carmack R. 12/02/2016, 7:38 AM

## 2016-12-02 NOTE — Evaluation (Signed)
Occupational Therapy Evaluation Patient Details Name: HALENA MOHAR MRN: [id number] DOB: 1950-09-02 Today's Date: 12/02/2016    History of Present Illness Pt is a 66 y.o. female s/p L TKA 12/01/16.  PMH includes DM, h/o shingles, htn.   Clinical Impression   Pt seen for OT evaluation this date. Pt was independent at baseline, caring for 68yo and 12yo great grandchildren and is eager to return to PLOF. Pt presents with 7/10 L knee pain, decreased strength/ROM/activity tolerance/knowledge of AE for self care tasks. Pt will benefit from skilled OT services to address noted impairments and functional deficits in self care tasks in order to maximize return to PLOF.     Follow Up Recommendations  SNF    Equipment Recommendations  3 in 1 bedside commode;Other (comment);Tub/shower seat (reacher, grab bars)    Recommendations for Other Services       Precautions / Restrictions Precautions Precautions: Knee;Fall Precaution Booklet Issued: No Required Braces or Orthoses: Knee Immobilizer - Left Knee Immobilizer - Left: Discontinue once straight leg raise with < 10 degree lag Restrictions Weight Bearing Restrictions: Yes LLE Weight Bearing: Weight bearing as tolerated      Mobility Bed Mobility Overal bed mobility: Needs Assistance      General bed mobility comments: deferred d/t up in recliner  Transfers Overall transfer level: Needs assistance          General transfer comment: pt declined due to pain, drowsy from medications    Balance                           ADL either performed or assessed with clinical judgement   ADL Overall ADL's : Needs assistance/impaired         Upper Body Bathing: Sitting;Set up   Lower Body Bathing: Minimal assistance;Sit to/from stand   Upper Body Dressing : Set up;Sitting   Lower Body Dressing: Sit to/from stand;Minimal assistance Lower Body Dressing Details (indicate cue type and reason): pt educated in use of AE for  LB dressing tasks with verbal instruction and visual demonstration, pt verbalized understanding               General ADL Comments: generally min assist for LB ADL     Vision Baseline Vision/History: Wears glasses Wears Glasses: Reading only Patient Visual Report: No change from baseline Vision Assessment?: No apparent visual deficits     Perception     Praxis      Pertinent Vitals/Pain Pain Assessment: 0-10 Pain Score: 7  Pain Location: L knee Pain Descriptors / Indicators: Constant;Sore;Tender Pain Intervention(s): Limited activity within patient's tolerance;Monitored during session;RN gave pain meds during session;Ice applied     Hand Dominance     Extremity/Trunk Assessment Upper Extremity Assessment Upper Extremity Assessment: Overall WFL for tasks assessed (L rotator cuff tear approx 10 years ago, no functional deficits )   Lower Extremity Assessment Lower Extremity Assessment: Defer to PT evaluation;LLE deficits/detail   Cervical / Trunk Assessment Cervical / Trunk Assessment: Normal   Communication Communication Communication: No difficulties   Cognition Arousal/Alertness: Awake/alert Behavior During Therapy: WFL for tasks assessed/performed Overall Cognitive Status: Within Functional Limits for tasks assessed                                     General Comments      Exercises    Shoulder Instructions  Home Living Family/patient expects to be discharged to:: Skilled nursing facility Living Arrangements: Children (8yo and 12yo great grandchildren)   Type of Home: House Home Access: Stairs to enter [employer] of Steps: 2-3 Entrance Stairs-Rails: None Home Layout: One level;Laundry or work area in basement     [employer] Shower/Tub: Teacher, early years/pre: Handicapped height     Home Equipment: Financial controller: Sock aid        Prior Functioning/Environment Level of  Independence: Independent        Comments: endorses 1 fall in 12 months - slipped in tub         OT Problem List: Pain;Decreased strength;Decreased activity tolerance;Decreased knowledge of use of DME or AE      OT Treatment/Interventions: Self-care/ADL training;Therapeutic activities;Therapeutic exercise;Energy conservation;DME and/or AE instruction;Patient/family education    OT Goals(Current goals can be found in the care plan section) Acute Rehab OT Goals Patient Stated Goal: have less pain OT Goal Formulation: With patient Time For Goal Achievement: 12/16/16 Potential to Achieve Goals: Good  OT Frequency: Min 1X/week   Barriers to D/C:            Co-evaluation              AM-PAC PT "6 Clicks" Daily Activity     Outcome Measure Help from another person eating meals?: None Help from another person taking care of personal grooming?: None Help from another person toileting, which includes using toliet, bedpan, or urinal?: A Little Help from another person bathing (including washing, rinsing, drying)?: A Little Help from another person to put on and taking off regular upper body clothing?: None Help from another person to put on and taking off regular lower body clothing?: A Little 6 Click Score: 21   End of Session    Activity Tolerance: Other (comment) (pt drowsy from pain medications) Patient left: in chair;with call bell/phone within reach;with chair alarm set;with SCD's reapplied;Other (comment) (polar care in place)  OT Visit Diagnosis: Other abnormalities of gait and mobility (R26.89);History of falling (Z91.81);Pain Pain - Right/Left: Left Pain - part of body: Knee                Time: 4142-3953 OT Time Calculation (min): 28 min Charges:  OT General Charges $OT Visit: 1 Procedure OT Evaluation $OT Eval Low Complexity: 1 Procedure OT Treatments $Self Care/Home Management : 8-22 mins G-Codes:     Jeni Salles, MPH, MS, OTR/L ascom  [phone] 12/02/16, 10:47 AM

## 2016-12-03 LAB — BASIC METABOLIC PANEL
Anion gap: 6 (ref 5–15)
BUN: 13 mg/dL (ref 6–20)
CHLORIDE: 102 mmol/L (ref 101–111)
CO2: 29 mmol/L (ref 22–32)
CREATININE: 0.75 mg/dL (ref 0.44–1.00)
Calcium: 8.4 mg/dL — ABNORMAL LOW (ref 8.9–10.3)
GFR calc Af Amer: >60 mL/min (ref >60)
GFR calc non Af Amer: >60 mL/min (ref >60)
GLUCOSE: 131 mg/dL — AB (ref 65–99)
Potassium: 3.8 mmol/L (ref 3.5–5.1)
SODIUM: 137 mmol/L (ref 135–145)

## 2016-12-03 LAB — CBC
HCT: 35.3 % (ref 35.0–47.0)
HEMOGLOBIN: 11.5 g/dL — AB (ref 12.0–16.0)
MCH: 27.2 pg (ref 26.0–34.0)
MCHC: 32.7 g/dL (ref 32.0–36.0)
MCV: 83 fL (ref 80.0–100.0)
Platelets: 230 10*3/uL (ref 150–440)
RBC: 4.25 MIL/uL (ref 3.80–5.20)
RDW: 13.8 % (ref 11.5–14.5)
WBC: 14.7 10*3/uL — ABNORMAL HIGH (ref 3.6–11.0)

## 2016-12-03 LAB — GLUCOSE, CAPILLARY
GLUCOSE-CAPILLARY: 120 mg/dL — AB (ref 65–99)
Glucose-Capillary: 136 mg/dL — ABNORMAL HIGH (ref 65–99)
Glucose-Capillary: 126 mg/dL — ABNORMAL HIGH (ref 65–99)
Glucose-Capillary: 111 mg/dL — ABNORMAL HIGH (ref 65–99)

## 2016-12-03 LAB — HEMOGLOBIN A1C
HEMOGLOBIN A1C: 6.6 % — AB (ref 4.8–5.6)
Mean Plasma Glucose: 143 mg/dL

## 2016-12-03 MED ORDER — LACTULOSE 10 GM/15ML PO SOLN
10.0000 g | Freq: Two times a day (BID) | ORAL | Status: DC | PRN
  Administered 2016-12-03: 10 g via ORAL
  Filled 2016-12-03: qty 30

## 2016-12-03 NOTE — Progress Notes (Signed)
   Subjective: 2 Days Post-Op Procedure(s) (LRB): COMPUTER ASSISTED TOTAL KNEE ARTHROPLASTY (Left) Patient reports pain as moderate.   Patient is well, and has had no acute complaints or problems Patient did well with therapy yesterday.  Plan is to go Rehab after hospital stay. no nausea and no vomiting Patient denies any chest pains or shortness of breath. Still not liking bone foam.  Objective: Vital signs in last 24 hours: Temp:  [97.5 F (36.4 C)-97.8 F (36.6 C)] 97.5 F (36.4 C) (06/19 1634) Pulse Rate:  [91-98] 98 (06/19 2300) Resp:  [14-18] 18 (06/19 2300) BP: (113-130)/(58-67) 130/67 (06/19 2300) SpO2:  [95 %-100 %] 95 % (06/19 2300) well approximated incision Heels are non tender and elevated off the bed using rolled towels Intake/Output from previous day: 06/19 0701 - 06/20 0700 In: 0037 [P.O.:480; I.V.:1015] Out: 100 [Drains:100] Intake/Output this shift: No intake/output data recorded.   Recent Labs  12/01/16 0634 12/02/16 0356 12/03/16 0459  HGB 13.9 11.6* 11.5*    Recent Labs  12/02/16 0356 12/03/16 0459  WBC 19.5* 14.7*  RBC 4.26 4.25  HCT 35.3 35.3  PLT 235 230    Recent Labs  12/02/16 0356 12/03/16 0459  NA 133* 137  K 3.2* 3.8  CL 100* 102  CO2 26 29  BUN 9 13  CREATININE 0.68 0.75  GLUCOSE 166* 131*  CALCIUM 7.9* 8.4*   No results for input(s): LABPT, INR in the last 72 hours.  EXAM General - Patient is Alert, Appropriate and Oriented Extremity - Neurologically intact Neurovascular intact Sensation intact distally Intact pulses distally Dorsiflexion/Plantar flexion intact No cellulitis present Compartment soft Dressing - dressing C/D/I Motor Function - intact, moving foot and toes well on exam.    Past Medical History:  Diagnosis Date  . Anxiety   . Arthritis   . Asthma   . Bronchitis 09/04/2016   urgent care  . Diabetes mellitus without complication ([place])   . GERD (gastroesophageal reflux disease)   . History  of shingles   . Hyperlipidemia   . Hypertension   . Neuralgia    Right side of head and face  . Shingles    chronic on Rt side of head and face    Assessment/Plan: 2 Days Post-Op Procedure(s) (LRB): COMPUTER ASSISTED TOTAL KNEE ARTHROPLASTY (Left) Active Problems:   S/P total knee arthroplasty  Estimated body mass index is 40.03 kg/m as calculated from the following:   Height as of this encounter: 5\' 3"  (1.6 m).   Weight as of this encounter: 102.5 kg (226 lb). Up with therapy Plan for discharge tomorrow Discharge to SNF  Labs: Were reviewed DVT Prophylaxis - Lovenox, Foot Pumps and TED hose Weight-Bearing as tolerated to left leg Hemovac discontinued on today's visit. Plan to discharge to [facility]. Patient needs a bowel movement. We'll add lactulose.  Jillyn Ledger. [place] 12/03/2016, 6:42 AM

## 2016-12-03 NOTE — Progress Notes (Signed)
Physical Therapy Treatment Patient Details Name: [name] MRN: [id number] DOB: 08/08/1950 Today's Date: 12/03/2016    History of Present Illness Pt is a 66 y.o. female s/p L TKA 12/01/16.  PMH includes DM, h/o shingles, htn.    PT Comments    Pt able to progress to ambulating 80 feet with RW (and use of L KI) with CGA.  Distance limited d/t L knee pain (7/10) and R>L UE fatigue.  Will continue to progress pt with strengthening, L knee ROM, and progressing ambulation distance per pt tolerance.    Follow Up Recommendations  SNF     Equipment Recommendations  Rolling walker with 5" wheels    Recommendations for Other Services       Precautions / Restrictions Precautions Precautions: Knee;Fall Precaution Booklet Issued: Yes (comment) Required Braces or Orthoses: Knee Immobilizer - Left Knee Immobilizer - Left: Discontinue once straight leg raise with < 10 degree lag Restrictions Weight Bearing Restrictions: Yes LLE Weight Bearing: Weight bearing as tolerated    Mobility  Bed Mobility               General bed mobility comments: deferred d/t pt up in chair beginning and end of session  Transfers Overall transfer level: Needs assistance Equipment used: Rolling walker (2 wheeled) Transfers: Sit to/from Stand Sit to Stand: Min guard         General transfer comment: CGA standing from recliner  Ambulation/Gait Ambulation/Gait assistance: Min guard Ambulation Distance (Feet): 80 Feet Assistive device: Rolling walker (2 wheeled) Gait Pattern/deviations: Step-to pattern;Decreased stance time - left;Antalgic Gait velocity: decreased   General Gait Details: L KI donned for ambulation; intermittent vc's required for gait technique and walker use (pt reporting R>L UE fatigue with distance)   Stairs            Wheelchair Mobility    Modified Rankin (Stroke Patients Only)       Balance Overall balance assessment: Needs assistance Sitting-balance  support: Bilateral upper extremity supported;Feet supported Sitting balance-Leahy Scale: Good Sitting balance - Comments: sitting reaching within BOS   Standing balance support: Bilateral upper extremity supported;During functional activity Standing balance-Leahy Scale: Good Standing balance comment: with ambulation                            Cognition Arousal/Alertness: Awake/alert Behavior During Therapy: WFL for tasks assessed/performed Overall Cognitive Status: Within Functional Limits for tasks assessed                                        Exercises      General Comments General comments (skin integrity, edema, etc.): L knee dressing and polar care in place upon PT arrival.  Pt agreeable to PT session.      Pertinent Vitals/Pain Pain Assessment: 0-10 Pain Score: 7  Pain Location: L knee Pain Descriptors / Indicators: Constant;Sore;Tender Pain Intervention(s): Limited activity within patient's tolerance;Monitored during session;Premedicated before session;Repositioned;Ice applied    Home Living                      Prior Function            PT Goals (current goals can now be found in the care plan section) Acute Rehab PT Goals Patient Stated Goal: have less pain PT Goal Formulation: With patient Time For Goal Achievement: 12/15/16 Potential  to Achieve Goals: Good Additional Goals Additional Goal #1: L knee ROM 0-90 degrees. Progress towards PT goals: Progressing toward goals    Frequency    BID      PT Plan Current plan remains appropriate    Co-evaluation              AM-PAC PT "6 Clicks" Daily Activity  Outcome Measure  Difficulty turning over in bed (including adjusting bedclothes, sheets and blankets)?: Total Difficulty moving from lying on back to sitting on the side of the bed? : Total Difficulty sitting down on and standing up from a chair with arms (e.g., wheelchair, bedside commode, etc,.)?:  Total Help needed moving to and from a bed to chair (including a wheelchair)?: A Little Help needed walking in hospital room?: A Little Help needed climbing 3-5 steps with a railing? : A Lot 6 Click Score: 11    End of Session Equipment Utilized During Treatment: Gait belt Activity Tolerance: Patient limited by pain Patient left: in chair;with call bell/phone within reach;with chair alarm set;with family/visitor present;with SCD's reapplied (B heels elevated via towel rolls; polar care in place and activated) Nurse Communication: Mobility status;Precautions;Weight bearing status;Other (comment) (L KI) PT Visit Diagnosis: Muscle weakness (generalized) (M62.81);Difficulty in walking, not elsewhere classified (R26.2);Pain Pain - Right/Left: Left Pain - part of body: Knee     Time: 6962-9528 PT Time Calculation (min) (ACUTE ONLY): 20 min  Charges:  $Gait Training: 8-22 mins                    G CodesLeitha Bleak, PT 12/03/16, 4:50 PM [phone]

## 2016-12-03 NOTE — Progress Notes (Signed)
Physical Therapy Treatment Patient Details Name: [name] MRN: [id number] DOB: 1951/03/29 Today's Date: 12/03/2016    History of Present Illness Pt is a 66 y.o. female s/p L TKA 12/01/16.  PMH includes DM, h/o shingles, htn.    PT Comments    Pt able to progress to ambulating up to 70 feet with RW CGA to min assist x1.  L LE quad muscle fatigue noted with distance ambulated.  Will continue to focus on strengthening, L knee ROM, and progressive ambulation (with use of KI on L LE) next session.   Follow Up Recommendations  SNF     Equipment Recommendations  Rolling walker with 5" wheels    Recommendations for Other Services       Precautions / Restrictions Precautions Precautions: Knee;Fall Precaution Booklet Issued: Yes (comment) Required Braces or Orthoses: Knee Immobilizer - Left Knee Immobilizer - Left: Discontinue once straight leg raise with < 10 degree lag Restrictions Weight Bearing Restrictions: Yes LLE Weight Bearing: Weight bearing as tolerated    Mobility  Bed Mobility Overal bed mobility: Needs Assistance Bed Mobility: Supine to Sit     Supine to sit: Min assist;HOB elevated     General bed mobility comments: assist for L LE; increased effort to perform  Transfers Overall transfer level: Needs assistance Equipment used: Rolling walker (2 wheeled) Transfers: Sit to/from Stand Sit to Stand: Min guard         General transfer comment: CGA for safety standing from bed and bedside commode (over toilet in bathroom); occasional vc's for hand and feet placement  Ambulation/Gait Ambulation/Gait assistance: Min guard;Min assist Ambulation Distance (Feet):  (70 feet; 25 feet) Assistive device: Rolling walker (2 wheeled) Gait Pattern/deviations: Step-to pattern;Decreased stance time - left;Antalgic Gait velocity: decreased   General Gait Details: intermittent vc's for gait technique (L quad set during L stance phase) and walker use; occasional L knee  mild buckling noted with distance/fatigue (none noted with shorter distances); will trial with knee immobilizer next session   Stairs            Wheelchair Mobility    Modified Rankin (Stroke Patients Only)       Balance Overall balance assessment: Needs assistance Sitting-balance support: Bilateral upper extremity supported;Feet supported Sitting balance-Leahy Scale: Good Sitting balance - Comments: sitting reaching within BOS   Standing balance support: Single extremity supported (on grab bar) Standing balance-Leahy Scale: Good Standing balance comment: no loss of balance with toileting hygiene                            Cognition Arousal/Alertness: Awake/alert Behavior During Therapy: WFL for tasks assessed/performed Overall Cognitive Status: Within Functional Limits for tasks assessed                                        Exercises Total Joint Exercises Ankle Circles/Pumps: AROM;Strengthening;Both;10 reps;Supine Quad Sets: AROM;Strengthening;Both;10 reps;Supine Short Arc Quad: AAROM;Strengthening;Left;10 reps;Supine (limited ROM d/t L knee pain) Heel Slides: AAROM;Strengthening;Left;10 reps;Supine (limited ROM d/t L knee pain) Hip ABduction/ADduction: AAROM;Strengthening;Left;10 reps;Supine Straight Leg Raises: AAROM;Strengthening;Left;10 reps;Supine Goniometric ROM: L knee extension 8 degrees short of neutral semi-supine in bed; L knee flexion 85 degrees in sitting    General Comments General comments (skin integrity, edema, etc.): L knee dressing, polar care, and bone foam in place upon PT entry.  Pt agreeable to PT session.  Pertinent Vitals/Pain Pain Assessment: 0-10 Pain Score: 6  Pain Location: L knee Pain Descriptors / Indicators: Constant;Sore;Tender Pain Intervention(s): Limited activity within patient's tolerance;Monitored during session;Premedicated before session;Repositioned;Ice applied  Vitals (HR and O2 on room  air) stable and WFL throughout treatment session.    Home Living                      Prior Function            PT Goals (current goals can now be found in the care plan section) Acute Rehab PT Goals Patient Stated Goal: have less pain PT Goal Formulation: With patient Time For Goal Achievement: 12/15/16 Potential to Achieve Goals: Good Additional Goals Additional Goal #1: L knee ROM 0-90 degrees. Progress towards PT goals: Progressing toward goals    Frequency    BID      PT Plan Current plan remains appropriate    Co-evaluation              AM-PAC PT "6 Clicks" Daily Activity  Outcome Measure  Difficulty turning over in bed (including adjusting bedclothes, sheets and blankets)?: Total Difficulty moving from lying on back to sitting on the side of the bed? : Total Difficulty sitting down on and standing up from a chair with arms (e.g., wheelchair, bedside commode, etc,.)?: Total Help needed moving to and from a bed to chair (including a wheelchair)?: A Little Help needed walking in hospital room?: A Little Help needed climbing 3-5 steps with a railing? : A Lot 6 Click Score: 11    End of Session Equipment Utilized During Treatment: Gait belt Activity Tolerance: Patient limited by pain Patient left: in chair;with call bell/phone within reach;with chair alarm set;with SCD's reapplied (B heels elevated via towel rolls; polar care in place and activated) Nurse Communication: Mobility status;Precautions;Weight bearing status (via white board) PT Visit Diagnosis: Muscle weakness (generalized) (M62.81);Difficulty in walking, not elsewhere classified (R26.2);Pain Pain - Right/Left: Left Pain - part of body: Knee     Time: 3976-7341 PT Time Calculation (min) (ACUTE ONLY): 38 min  Charges:  $Gait Training: 8-22 mins $Therapeutic Exercise: 8-22 mins $Therapeutic Activity: 8-22 mins                    G CodesLeitha Bleak, PT 12/03/16,  11:48 AM [phone]

## 2016-12-03 NOTE — Progress Notes (Signed)
Pt remaining alert and oriented. Medicated for pain with good results. Up to bathroom with assistance. Surgical dressing remaining dry and intact. Able to sleep in between care.

## 2016-12-03 NOTE — Progress Notes (Signed)
OT Cancellation Note  Patient Details Name: [name] MRN: [id number] DOB: 11/30/1950   Cancelled Treatment:    Reason Eval/Treat Not Completed: Other (comment). Pt working with PT upon entry into room. Will re-attempt OT treatment session next date.  Jeni Salles, MPH, MS, OTR/L ascom [phone] 12/03/16, 4:18 PM

## 2016-12-04 LAB — GLUCOSE, CAPILLARY: Glucose-Capillary: 110 mg/dL — ABNORMAL HIGH (ref 65–99)

## 2016-12-04 MED ORDER — TRAMADOL HCL 50 MG PO TABS: 50.0000 mg | ORAL_TABLET | ORAL | 0 refills | Status: DC | PRN

## 2016-12-04 MED ORDER — ENOXAPARIN SODIUM 30 MG/0.3ML ~~LOC~~ SOLN: 40.0000 mg | SUBCUTANEOUS | 0 refills | Status: DC

## 2016-12-04 MED ORDER — OXYCODONE HCL 5 MG PO TABS: 5.0000 mg | ORAL_TABLET | ORAL | 0 refills | Status: DC | PRN

## 2016-12-04 NOTE — Progress Notes (Signed)
Assessment completed this am. Pt oob to recliner for breakfast,legs elevated. Pt has polar care intact to l knee, immobilizer intact, teds and foot pumps in place. Pt stated that she has some pain. Pt is on room air, lungs clear bilat, Hr regular, abdomen is soft, bs heard.ppp, no pitting edema noted to legs. piv #20 intact to rac, site free of redness and swelling. Since assessment, pt worked with physical therapy. This Probation officer gave report to [place] at [facility], and called ems. PIV was dc'd by this Probation officer with catheter intact. Pt dc'd via ems at approx 1200.

## 2016-12-04 NOTE — Progress Notes (Signed)
   Subjective: 3 Days Post-Op Procedure(s) (LRB): COMPUTER ASSISTED TOTAL KNEE ARTHROPLASTY (Left) Patient reports pain as mild.   Patient is well, and has had no acute complaints or problems Patient did very well with therapy yesterday. Plan is to go Rehab after hospital stay. no nausea and no vomiting Patient denies any chest pains or shortness of breath. Patient sitting up in chair. Very comfortable morning.  Objective: Vital signs in last 24 hours: Temp:  [97.6 F (36.4 C)-98.7 F (37.1 C)] 98.7 F (37.1 C) (06/20 2230) Pulse Rate:  [105-121] 115 (06/20 2230) Resp:  [14-18] 18 (06/20 2230) BP: (126-132)/(55-87) 130/55 (06/20 2230) SpO2:  [96 %-100 %] 96 % (06/20 2230) well approximated incision Heels are non tender and elevated off the bed using rolled towels Intake/Output from previous day: 06/20 0701 - 06/21 0700 In: 240 [P.O.:240] Out: -  Intake/Output this shift: No intake/output data recorded.   Recent Labs  12/02/16 0356 12/03/16 0459  HGB 11.6* 11.5*    Recent Labs  12/02/16 0356 12/03/16 0459  WBC 19.5* 14.7*  RBC 4.26 4.25  HCT 35.3 35.3  PLT 235 230    Recent Labs  12/02/16 0356 12/03/16 0459  NA 133* 137  K 3.2* 3.8  CL 100* 102  CO2 26 29  BUN 9 13  CREATININE 0.68 0.75  GLUCOSE 166* 131*  CALCIUM 7.9* 8.4*   No results for input(s): LABPT, INR in the last 72 hours.  EXAM General - Patient is Alert, Appropriate and Oriented Extremity - Neurologically intact Neurovascular intact Sensation intact distally Intact pulses distally Dorsiflexion/Plantar flexion intact No cellulitis present Compartment soft Dressing - dressing C/D/I Motor Function - intact, moving foot and toes well on exam.    Past Medical History:  Diagnosis Date  . Anxiety   . Arthritis   . Asthma   . Bronchitis 09/04/2016   urgent care  . Diabetes mellitus without complication ([place])   . GERD (gastroesophageal reflux disease)   . History of shingles   .  Hyperlipidemia   . Hypertension   . Neuralgia    Right side of head and face  . Shingles    chronic on Rt side of head and face    Assessment/Plan: 3 Days Post-Op Procedure(s) (LRB): COMPUTER ASSISTED TOTAL KNEE ARTHROPLASTY (Left) Active Problems:   S/P total knee arthroplasty  Estimated body mass index is 40.03 kg/m as calculated from the following:   Height as of this encounter: 5\' 3"  (1.6 m).   Weight as of this encounter: 102.5 kg (226 lb). Up with therapy Discharge to SNF  Labs: No labs today DVT Prophylaxis - Lovenox, Foot Pumps and TED hose Weight-Bearing as tolerated to left leg Please change dressing prior to patient being discharged.  Jillyn Ledger. Pickrell Caraway 12/04/2016, 7:17 AM

## 2016-12-04 NOTE — Progress Notes (Signed)
Pt alert and oriented. No complaints of pain this shift. Pain medicine offered and pt declined. Up to bathroom with assistance. Pt was able to move bowels this shift. Pt voiding without difficulty. Bone Foam on and off throughout shift not able to tolerated all night. Surgical dressing dry and intact.

## 2016-12-04 NOTE — Clinical Social Work Placement (Addendum)
   CLINICAL SOCIAL WORK PLACEMENT  NOTE  Date:  12/04/2016  Patient Details  Name: [name] MRN: [id number] Date of Birth: 09-29-1950  Clinical Social Work is seeking post-discharge placement for this patient at the [place] level of care (*CSW will initial, date and re-position this form in  chart as items are completed):  Yes   Patient/family provided with [place] Work Department's list of facilities offering this level of care within the geographic area requested by the patient (or if unable, by the patient's family).  Yes   Patient/family informed of their freedom to choose among providers that offer the needed level of care, that participate in Medicare, Medicaid or managed care program needed by the patient, have an available bed and are willing to accept the patient.  Yes   Patient/family informed of [facility]'s ownership interest in [facility] and [facility], as well as of the fact that they are under no obligation to receive care at these facilities.  PASRR submitted to EDS on       PASRR number received on       Existing PASRR number confirmed on 12/02/16     FL2 transmitted to all facilities in geographic area requested by pt/family on 12/02/16     FL2 transmitted to all facilities within larger geographic area on       Patient informed that his/her managed care company has contracts with or will negotiate with certain facilities, including the following:        Yes   Patient/family informed of bed offers received.  Patient chooses bed at  [facility] )     Physician recommends and patient chooses bed at      Patient to be transferred to  ([facility] ) on 12/04/16.  Patient to be transferred to facility by  [facility] )     Patient family notified on 12/04/16 of transfer.  Name of family member notified:   ([place] left patient's son Maurene Capes and daughter Katharine Look a Advertising account executive. ) Patient's  daughter Katharine Look called CSW back and is in agreement with D/C plan. Patient's son Maurene Capes called CSW back and is in agreement with D/C plan.    PHYSICIAN       Additional Comment:    _______________________________________________ Lonzy Mato, Veronia Beets, LCSW 12/04/2016, 11:11 AM

## 2016-12-04 NOTE — Care Management Important Message (Signed)
Important Message  Patient Details  Name: [name] MRN: [id number] Date of Birth: [birth year]   Medicare Important Message Given:  Yes    Jolly Mango, RN 12/04/2016, 9:09 AM

## 2016-12-04 NOTE — Progress Notes (Signed)
Pt up in chair this morning.

## 2016-12-04 NOTE — Progress Notes (Addendum)
Patient is medically stable for D/C to [facility] today. Per [facility] admissions coordinator at [facility] patient can come today to room 406. RN will call report and arrange EMS for transport. Clinical Education officer, museum (CSW) sent D/C orders to [facility] via [place]. Patient is aware of above. CSW attempted to contact [name] and her daughter [name] however they did not answer and a voicemail was left. Please reconsult if future social work needs arise. CSW signing off.   [name] called CSW back and was made aware of above. Daughter is in agreement with plan.  [name] called CSW back and was made aware of above. Son is in agreement with plan.   [employer], LCSW [phone]

## 2016-12-04 NOTE — Progress Notes (Signed)
Physical Therapy Treatment Patient Details Name: [name] MRN: [id number] DOB: June 18, 1950 Today's Date: 12/04/2016    History of Present Illness Pt is a 66 y.o. female s/p L TKA 12/01/16.  PMH includes DM, h/o shingles, htn.    PT Comments    Pt able to progress to ambulating 110 feet with RW CGA.  L KI utilized with ambulation; pt reporting L LE weakness with distance requiring cueing for L quad set during L stance phase.  Pain 5/10 L knee beginning and end of session.  Will continue to progress pt with L knee ROM, strengthening, and increasing ambulation distance per pt tolerance.    Follow Up Recommendations  SNF     Equipment Recommendations  Rolling walker with 5" wheels    Recommendations for Other Services       Precautions / Restrictions Precautions Precautions: Knee;Fall Precaution Booklet Issued: Yes (comment) Required Braces or Orthoses: Knee Immobilizer - Left Knee Immobilizer - Left: Discontinue once straight leg raise with < 10 degree lag Restrictions Weight Bearing Restrictions: Yes LLE Weight Bearing: Weight bearing as tolerated    Mobility  Bed Mobility Overal bed mobility: Needs Assistance Bed Mobility: Supine to Sit;Sit to Supine     Supine to sit: Min assist;HOB elevated Sit to supine: Min assist;HOB elevated   General bed mobility comments: minimal assist for L LE  Transfers Overall transfer level: Needs assistance Equipment used: Rolling walker (2 wheeled) Transfers: Sit to/from Stand Sit to Stand: Min guard         General transfer comment: strong stand from bed (with use of L KI)  Ambulation/Gait Ambulation/Gait assistance: Min guard Ambulation Distance (Feet): 110 Feet Assistive device: Rolling walker (2 wheeled) Gait Pattern/deviations: Step-to pattern;Decreased stance time - left;Antalgic Gait velocity: decreased   General Gait Details: L KI donned for ambulation; intermittent vc's required for L quad set during L stance  phase   Stairs            Wheelchair Mobility    Modified Rankin (Stroke Patients Only)       Balance Overall balance assessment: Needs assistance Sitting-balance support: No upper extremity supported;Feet supported Sitting balance-Leahy Scale: Good Sitting balance - Comments: sitting reaching within BOS   Standing balance support: Bilateral upper extremity supported;During functional activity Standing balance-Leahy Scale: Good Standing balance comment: with ambulation                            Cognition Arousal/Alertness: Awake/alert Behavior During Therapy: WFL for tasks assessed/performed Overall Cognitive Status: Within Functional Limits for tasks assessed                                        Exercises Total Joint Exercises Ankle Circles/Pumps: AROM;Strengthening;Both;10 reps;Supine Quad Sets: AROM;Strengthening;Both;10 reps;Supine Short Arc Quad: AAROM;Strengthening;Left;10 reps;Supine Heel Slides: AAROM;Strengthening;Left;10 reps;Supine Hip ABduction/ADduction: AAROM;Strengthening;Left;10 reps;Supine Straight Leg Raises: AAROM;Strengthening;Left;10 reps;Supine Long Arc Quad: Strengthening;Both;10 reps;Seated (AROM R; AAROM L) Knee Flexion: AAROM;Strengthening;Left;10 reps;Seated (3 second holds at end range) Goniometric ROM: L knee extension 6 degrees short of neutral semi-supine in bed; L knee flexion 92 degrees in sitting General Exercises - Lower Extremity Hip Flexion/Marching: AROM;Strengthening;Both;10 reps;Seated    General Comments General comments (skin integrity, edema, etc.): L knee dressing, polar care, and KI in place upon PT arrival.  Pt agreeable to PT session.      Pertinent Vitals/Pain  Pain Assessment: 0-10 Pain Score: 5  Pain Location: L knee Pain Descriptors / Indicators: Constant;Sore;Tender Pain Intervention(s): Limited activity within patient's tolerance;Monitored during session;Premedicated before  session;Repositioned;Ice applied  Vitals (HR and O2 on room air) stable and WFL throughout treatment session.    Home Living                      Prior Function            PT Goals (current goals can now be found in the care plan section) Acute Rehab PT Goals Patient Stated Goal: have less pain PT Goal Formulation: With patient Time For Goal Achievement: 12/15/16 Potential to Achieve Goals: Good Additional Goals Additional Goal #1: L knee ROM 0-90 degrees. Progress towards PT goals: Progressing toward goals    Frequency    BID      PT Plan Current plan remains appropriate    Co-evaluation              AM-PAC PT "6 Clicks" Daily Activity  Outcome Measure  Difficulty turning over in bed (including adjusting bedclothes, sheets and blankets)?: Total Difficulty moving from lying on back to sitting on the side of the bed? : Total Difficulty sitting down on and standing up from a chair with arms (e.g., wheelchair, bedside commode, etc,.)?: Total Help needed moving to and from a bed to chair (including a wheelchair)?: A Little Help needed walking in hospital room?: A Little Help needed climbing 3-5 steps with a railing? : A Lot 6 Click Score: 11    End of Session Equipment Utilized During Treatment: Gait belt Activity Tolerance: Patient tolerated treatment well Patient left: in bed;with call bell/phone within reach;with bed alarm set;with SCD's reapplied (B heels elevated via towel rolls; polar care in place and activated) Nurse Communication: Mobility status;Precautions;Weight bearing status;Other (comment) (L KI) PT Visit Diagnosis: Muscle weakness (generalized) (M62.81);Difficulty in walking, not elsewhere classified (R26.2);Pain Pain - Right/Left: Left Pain - part of body: Knee     Time: 3976-7341 PT Time Calculation (min) (ACUTE ONLY): 38 min  Charges:  $Gait Training: 8-22 mins $Therapeutic Exercise: 8-22 mins $Therapeutic Activity: 8-22  mins                    G CodesLeitha Bleak, PT 12/04/16, 10:18 AM [phone]

## 2016-12-04 NOTE — Plan of Care (Signed)
Problem: Skin Integrity: Goal: Signs of wound healing will improve Outcome: Completed/Met Date Met: 12/04/16 Pt has met goals for discharge to snf

## 2016-12-05 DIAGNOSIS — M150 Primary generalized (osteo)arthritis: Secondary | ICD-10-CM | POA: Diagnosis not present

## 2016-12-05 DIAGNOSIS — E669 Obesity, unspecified: Secondary | ICD-10-CM | POA: Diagnosis not present

## 2016-12-05 DIAGNOSIS — I10 Essential (primary) hypertension: Secondary | ICD-10-CM | POA: Diagnosis not present

## 2016-12-05 DIAGNOSIS — M159 Polyosteoarthritis, unspecified: Secondary | ICD-10-CM | POA: Insufficient documentation

## 2016-12-08 DIAGNOSIS — R52 Pain, unspecified: Secondary | ICD-10-CM | POA: Diagnosis not present

## 2016-12-08 DIAGNOSIS — K5900 Constipation, unspecified: Secondary | ICD-10-CM | POA: Diagnosis not present

## 2016-12-08 DIAGNOSIS — G8929 Other chronic pain: Secondary | ICD-10-CM | POA: Diagnosis not present

## 2016-12-08 DIAGNOSIS — G4700 Insomnia, unspecified: Secondary | ICD-10-CM | POA: Diagnosis not present

## 2016-12-09 DIAGNOSIS — R2689 Other abnormalities of gait and mobility: Secondary | ICD-10-CM | POA: Diagnosis not present

## 2016-12-09 DIAGNOSIS — M25561 Pain in right knee: Secondary | ICD-10-CM | POA: Diagnosis not present

## 2016-12-09 DIAGNOSIS — G8911 Acute pain due to trauma: Secondary | ICD-10-CM | POA: Diagnosis not present

## 2016-12-09 DIAGNOSIS — F3289 Other specified depressive episodes: Secondary | ICD-10-CM | POA: Diagnosis not present

## 2016-12-09 DIAGNOSIS — Z5189 Encounter for other specified aftercare: Secondary | ICD-10-CM | POA: Diagnosis not present

## 2016-12-09 DIAGNOSIS — M6281 Muscle weakness (generalized): Secondary | ICD-10-CM | POA: Diagnosis not present

## 2016-12-09 DIAGNOSIS — G603 Idiopathic progressive neuropathy: Secondary | ICD-10-CM | POA: Diagnosis not present

## 2016-12-09 DIAGNOSIS — Z96651 Presence of right artificial knee joint: Secondary | ICD-10-CM | POA: Diagnosis not present

## 2016-12-09 DIAGNOSIS — J45901 Unspecified asthma with (acute) exacerbation: Secondary | ICD-10-CM | POA: Diagnosis not present

## 2016-12-09 DIAGNOSIS — K219 Gastro-esophageal reflux disease without esophagitis: Secondary | ICD-10-CM | POA: Diagnosis not present

## 2016-12-09 DIAGNOSIS — M12562 Traumatic arthropathy, left knee: Secondary | ICD-10-CM | POA: Diagnosis not present

## 2016-12-09 DIAGNOSIS — M792 Neuralgia and neuritis, unspecified: Secondary | ICD-10-CM | POA: Diagnosis not present

## 2016-12-09 DIAGNOSIS — I10 Essential (primary) hypertension: Secondary | ICD-10-CM | POA: Diagnosis not present

## 2016-12-09 DIAGNOSIS — E876 Hypokalemia: Secondary | ICD-10-CM | POA: Diagnosis not present

## 2016-12-09 DIAGNOSIS — E568 Deficiency of other vitamins: Secondary | ICD-10-CM | POA: Diagnosis not present

## 2016-12-09 DIAGNOSIS — E119 Type 2 diabetes mellitus without complications: Secondary | ICD-10-CM | POA: Diagnosis not present

## 2016-12-10 DIAGNOSIS — Z96651 Presence of right artificial knee joint: Secondary | ICD-10-CM | POA: Diagnosis not present

## 2016-12-10 DIAGNOSIS — M25561 Pain in right knee: Secondary | ICD-10-CM | POA: Diagnosis not present

## 2016-12-10 DIAGNOSIS — E119 Type 2 diabetes mellitus without complications: Secondary | ICD-10-CM | POA: Diagnosis not present

## 2016-12-10 DIAGNOSIS — I10 Essential (primary) hypertension: Secondary | ICD-10-CM | POA: Diagnosis not present

## 2016-12-10 DIAGNOSIS — J45901 Unspecified asthma with (acute) exacerbation: Secondary | ICD-10-CM | POA: Diagnosis not present

## 2016-12-11 ENCOUNTER — Other Ambulatory Visit: Payer: Self-pay | Admitting: *Deleted

## 2016-12-11 NOTE — Patient Outreach (Signed)
[facility]) Care Management  12/11/2016  [name] Jan 30, 1951 [id number]   Met with patient at bedside. She is s/p left TKR. She states she is doing well overall. She does have diabetes but states her  AIC is 6.9 Denies any issues with medications, transportation. RNCM reviewed [facility] care management, left brochure for patient future reference. Patient denies any needs at this time.   Plan to sign off at this time.  Royetta Crochet. Laymond Purser, RN, BSN, [place] [phone]) Business Cell  [phone]) Toll Free Office

## 2016-12-18 ENCOUNTER — Encounter: Payer: Medicare Other | Admitting: Family Medicine

## 2016-12-18 ENCOUNTER — Ambulatory Visit: Payer: Medicare Other

## 2016-12-18 DIAGNOSIS — M792 Neuralgia and neuritis, unspecified: Secondary | ICD-10-CM | POA: Diagnosis not present

## 2016-12-18 DIAGNOSIS — J45901 Unspecified asthma with (acute) exacerbation: Secondary | ICD-10-CM | POA: Diagnosis not present

## 2016-12-18 DIAGNOSIS — Z96651 Presence of right artificial knee joint: Secondary | ICD-10-CM | POA: Diagnosis not present

## 2016-12-18 DIAGNOSIS — I10 Essential (primary) hypertension: Secondary | ICD-10-CM | POA: Diagnosis not present

## 2016-12-18 DIAGNOSIS — R2689 Other abnormalities of gait and mobility: Secondary | ICD-10-CM | POA: Diagnosis not present

## 2016-12-24 ENCOUNTER — Telehealth: Payer: Self-pay | Admitting: Family Medicine

## 2016-12-24 DIAGNOSIS — I10 Essential (primary) hypertension: Secondary | ICD-10-CM | POA: Diagnosis not present

## 2016-12-24 DIAGNOSIS — Z471 Aftercare following joint replacement surgery: Secondary | ICD-10-CM | POA: Diagnosis not present

## 2016-12-24 NOTE — Telephone Encounter (Signed)
Skeet Simmer at [facility] wants order for OT and PT . He wants Pt 1 week 2 times 3 times a week 1.  Total 5 times in 2 weeks.Marland Kitchen  He just wants a verbal [phone]  Thanks, Con Memos

## 2016-12-24 NOTE — Telephone Encounter (Signed)
Please review-aa 

## 2016-12-25 ENCOUNTER — Telehealth: Payer: Self-pay | Admitting: Family Medicine

## 2016-12-25 NOTE — Telephone Encounter (Signed)
Lemuel with [facility] advised to contact Dr. Marry Guan for verbal order.

## 2016-12-25 NOTE — Telephone Encounter (Signed)
Don with [facility] called saying verbal for OT 1 time week for 2 weeks.  Exercise, iadl, ok to discharge when goal met or at max potential.  Pleas advise  [phone]  Thanks teri

## 2016-12-25 NOTE — Telephone Encounter (Signed)
Per last message regarding verbal order for OT. Contacted Don with [facility] and advised him patient has not been seen since 03/2016 and to contact Dr. Marry Guan for verbal order.

## 2016-12-25 NOTE — Telephone Encounter (Signed)
Is this in regards to her orthopedic surgery (Dr. Marry Guan)? They may need to check with him to see what he ordered. I haven't seen her since October 2017.

## 2016-12-26 DIAGNOSIS — Z471 Aftercare following joint replacement surgery: Secondary | ICD-10-CM | POA: Diagnosis not present

## 2016-12-26 DIAGNOSIS — I10 Essential (primary) hypertension: Secondary | ICD-10-CM | POA: Diagnosis not present

## 2016-12-29 ENCOUNTER — Encounter: Payer: Self-pay | Admitting: Family Medicine

## 2016-12-29 ENCOUNTER — Ambulatory Visit: Payer: Medicare Other | Admitting: Family Medicine

## 2016-12-29 DIAGNOSIS — I10 Essential (primary) hypertension: Secondary | ICD-10-CM | POA: Diagnosis not present

## 2016-12-29 DIAGNOSIS — Z471 Aftercare following joint replacement surgery: Secondary | ICD-10-CM | POA: Diagnosis not present

## 2016-12-30 DIAGNOSIS — Z471 Aftercare following joint replacement surgery: Secondary | ICD-10-CM | POA: Diagnosis not present

## 2016-12-30 DIAGNOSIS — I10 Essential (primary) hypertension: Secondary | ICD-10-CM | POA: Diagnosis not present

## 2016-12-31 ENCOUNTER — Telehealth: Payer: Self-pay | Admitting: Family Medicine

## 2016-12-31 DIAGNOSIS — I10 Essential (primary) hypertension: Secondary | ICD-10-CM | POA: Diagnosis not present

## 2016-12-31 DIAGNOSIS — Z471 Aftercare following joint replacement surgery: Secondary | ICD-10-CM | POA: Diagnosis not present

## 2016-12-31 NOTE — Telephone Encounter (Signed)
Please advise. Thanks.  

## 2016-12-31 NOTE — Telephone Encounter (Signed)
[name] with [facility] called to report that pt's blood pressure was high today 140/96.  Her blood pressure is usually normal  Please advise  [phone]   Thanks,  Con Memos

## 2017-01-01 NOTE — Telephone Encounter (Signed)
LM on [name]'s Voicemail stating below.

## 2017-01-01 NOTE — Telephone Encounter (Signed)
Should be taking Amlodipine 5 mg qd and HCTZ 25 mg qd for BP. Restrict sodium/salt intake and schedule follow up appointment.

## 2017-01-02 ENCOUNTER — Telehealth: Payer: Self-pay | Admitting: Family Medicine

## 2017-01-02 DIAGNOSIS — Z471 Aftercare following joint replacement surgery: Secondary | ICD-10-CM | POA: Diagnosis not present

## 2017-01-02 DIAGNOSIS — I10 Essential (primary) hypertension: Secondary | ICD-10-CM | POA: Diagnosis not present

## 2017-01-02 NOTE — Telephone Encounter (Signed)
Lemuel with [facility] is requesting a call back to discuss pt medications.  YB#[phone]/JF

## 2017-01-02 NOTE — Telephone Encounter (Signed)
LMTCB

## 2017-01-02 NOTE — Telephone Encounter (Signed)
Spoke with [place]. Clarified medication.

## 2017-01-05 DIAGNOSIS — M25662 Stiffness of left knee, not elsewhere classified: Secondary | ICD-10-CM | POA: Diagnosis not present

## 2017-01-05 DIAGNOSIS — M25562 Pain in left knee: Secondary | ICD-10-CM | POA: Diagnosis not present

## 2017-01-05 DIAGNOSIS — M6281 Muscle weakness (generalized): Secondary | ICD-10-CM | POA: Diagnosis not present

## 2017-01-05 DIAGNOSIS — Z96652 Presence of left artificial knee joint: Secondary | ICD-10-CM | POA: Diagnosis not present

## 2017-01-07 DIAGNOSIS — M25562 Pain in left knee: Secondary | ICD-10-CM | POA: Diagnosis not present

## 2017-01-07 DIAGNOSIS — M25662 Stiffness of left knee, not elsewhere classified: Secondary | ICD-10-CM | POA: Diagnosis not present

## 2017-01-07 DIAGNOSIS — M6281 Muscle weakness (generalized): Secondary | ICD-10-CM | POA: Diagnosis not present

## 2017-01-07 DIAGNOSIS — Z96652 Presence of left artificial knee joint: Secondary | ICD-10-CM | POA: Diagnosis not present

## 2017-01-09 DIAGNOSIS — Z96652 Presence of left artificial knee joint: Secondary | ICD-10-CM | POA: Diagnosis not present

## 2017-01-09 DIAGNOSIS — M25662 Stiffness of left knee, not elsewhere classified: Secondary | ICD-10-CM | POA: Diagnosis not present

## 2017-01-09 DIAGNOSIS — M6281 Muscle weakness (generalized): Secondary | ICD-10-CM | POA: Diagnosis not present

## 2017-01-09 DIAGNOSIS — M25562 Pain in left knee: Secondary | ICD-10-CM | POA: Diagnosis not present

## 2017-01-12 ENCOUNTER — Ambulatory Visit (INDEPENDENT_AMBULATORY_CARE_PROVIDER_SITE_OTHER): Payer: Medicare Other | Admitting: Family Medicine

## 2017-01-12 ENCOUNTER — Encounter: Payer: Self-pay | Admitting: Family Medicine

## 2017-01-12 VITALS — BP 130/88 | HR 61 | Temp 98.1°F | Wt 219.0 lb

## 2017-01-12 DIAGNOSIS — E876 Hypokalemia: Secondary | ICD-10-CM | POA: Diagnosis not present

## 2017-01-12 DIAGNOSIS — J45909 Unspecified asthma, uncomplicated: Secondary | ICD-10-CM

## 2017-01-12 DIAGNOSIS — I10 Essential (primary) hypertension: Secondary | ICD-10-CM

## 2017-01-12 DIAGNOSIS — M6281 Muscle weakness (generalized): Secondary | ICD-10-CM | POA: Diagnosis not present

## 2017-01-12 DIAGNOSIS — Z96653 Presence of artificial knee joint, bilateral: Secondary | ICD-10-CM | POA: Diagnosis not present

## 2017-01-12 DIAGNOSIS — M25662 Stiffness of left knee, not elsewhere classified: Secondary | ICD-10-CM | POA: Diagnosis not present

## 2017-01-12 DIAGNOSIS — M25562 Pain in left knee: Secondary | ICD-10-CM | POA: Diagnosis not present

## 2017-01-12 DIAGNOSIS — Z96652 Presence of left artificial knee joint: Secondary | ICD-10-CM | POA: Diagnosis not present

## 2017-01-12 DIAGNOSIS — B0222 Postherpetic trigeminal neuralgia: Secondary | ICD-10-CM

## 2017-01-12 MED ORDER — FLUTICASONE FUROATE-VILANTEROL 100-25 MCG/INH IN AEPB: 1.0000 | INHALATION_SPRAY | Freq: Every day | RESPIRATORY_TRACT | 6 refills | Status: DC

## 2017-01-12 NOTE — Progress Notes (Signed)
Patient: [name] Female    DOB: 07-10-1950   66 y.o.   MRN: [id number] Visit Date: 01/12/2017  Today's Provider: Vernie Murders, PA   Chief Complaint  Patient presents with  . Hospitalization Follow-up   Subjective:    HPI Patient is here today to follow up s/p total left knee arthroplasty. Surgery was done on 12/01/2016 by Dr. Skip Estimable. Patient was discharged from [facility] on 12/04/2016 to [facility] for rehabilitation services. At [facility] she received physical therapy. She was discharged from there approximately 10 days after. Patient was unsure of discharge date. She is continuing PT at [facility] 3 times per week.   Patient Active Problem List   Diagnosis Date Noted  . Primary osteoarthritis involving multiple joints 12/05/2016  . S/P total knee arthroplasty 12/01/2016  . Elevated C-reactive protein (CRP) 10/06/2016  . Hypokalemia 10/06/2016  . Primary osteoarthritis of left knee 09/07/2016  . Hypertension 07/24/2016  . Long term current use of opiate analgesic 07/24/2016  . Long term prescription opiate use 07/24/2016  . Opiate use 07/24/2016  . Breast mass, right 04/16/2016  . Encounter for long-term (current) use of medications 01/21/2016  . Disturbance of skin sensation 01/21/2016  . Occipital headache 01/21/2016  . Bilateral occipital neuralgia 01/21/2016  . Chronic Atypical facial pain (Location of Primary Source of Pain) (Right) 05/07/2015  . Chronic pain syndrome 05/07/2015  . Gasserian ganglionitis 03/12/2015  . Neuropathic pain 03/12/2015  . Herpes zoster ophthalmicus 03/12/2015  . Anxiety 01/11/2015  . Diabetes ([place]) 01/11/2015  . Photopsia 01/11/2015  . Avitaminosis A 01/11/2015  . Depression 01/10/2015  . Postherpetic neuralgia 11/22/2014  . Obesity (BMI 35.0-39.9 without comorbidity) ([place]) 04/18/2014  . Neuropathic postherpetic trigeminal neuralgia (Location of Primary Source of Pain) (Right) (V1) 12/21/2013  . Cannot sleep  03/13/2009  . Asthma 02/07/2009  . Calcium deficiency disease 10/16/2008  . Hypercholesterolemia without hypertriglyceridemia 07/06/2008  . Avitaminosis D 07/05/2008  . History of colon polyps 10/17/2005   Past Surgical History:  Procedure Laterality Date  . ABDOMINAL HYSTERECTOMY    . BREAST BIOPSY Right 2016   benign  . BREAST BIOPSY Right 04/08/2016   neg- benign  . BREAST LUMPECTOMY Right 03/2016  . BREAST LUMPECTOMY WITH NEEDLE LOCALIZATION Right 05/02/2016   Procedure: BREAST LUMPECTOMY WITH NEEDLE LOCALIZATION;  Surgeon: Clayburn Pert, MD;  Location: [facility] ORS;  Service: General;  Laterality: Right;  . CHOLECYSTECTOMY    . KNEE ARTHROPLASTY Left 12/01/2016   Procedure: COMPUTER ASSISTED TOTAL KNEE ARTHROPLASTY;  Surgeon: Dereck Leep, MD;  Location: [facility] ORS;  Service: Orthopedics;  Laterality: Left;  . TOTAL KNEE ARTHROPLASTY Right 2014  . TOTAL KNEE ARTHROPLASTY Right 02/16/2013   Family History  Problem Relation Age of Onset  . Anuerysm Mother        Brain  . Dementia Mother   . Heart failure Father   . CAD Father   . Dementia Father   . Diabetes Father   . Lupus Sister   . Diabetes Brother   . Throat cancer Maternal Grandfather   . Breast cancer Neg Hx    Allergies  Allergen Reactions  . Lisinopril Cough  . Lovastatin Other (See Comments)    Myalgia  . Shellfish Allergy Rash     Previous Medications   ALBUTEROL (PROVENTIL HFA;VENTOLIN HFA) 108 (90 BASE) MCG/ACT INHALER    Inhale 2 puffs into the lungs every 4 (four) hours as needed for wheezing or shortness of breath.   AMLODIPINE (  NORVASC) 5 MG TABLET    Take 1 tablet (5 mg total) by mouth daily.   CARBOXYMETHYLCELLULOSE SODIUM (LUBRICANT EYE DROPS OP)    Place 1 drop into both eyes 4 (four) times daily as needed (FOR DRY EYES).   ENOXAPARIN (LOVENOX) 30 MG/0.3ML INJECTION    Inject 0.4 mLs (40 mg total) into the skin daily.   GABAPENTIN (NEURONTIN) 800 MG TABLET    Take 1 tablet (800 mg total) by mouth  every 6 (six) hours.   HYDROCHLOROTHIAZIDE (MICROZIDE) 12.5 MG CAPSULE    Take 25 mg by mouth every evening.    LANSOPRAZOLE (PREVACID) 15 MG CAPSULE    Take 15 mg by mouth daily as needed.   METOPROLOL TARTRATE (LOPRESSOR) 25 MG TABLET    Take 25 mg by mouth 2 (two) times daily.   MULTIPLE VITAMIN (MULTIVITAMIN WITH MINERALS) TABS TABLET    Take 1 tablet by mouth daily. WOMEN'S MULTIVITAMIN   NONFORMULARY OR COMPOUNDED ITEM    Compounded cream: 6% Gabapentin, 2.5% Lidocaine, 0.5% Meloxicam, 5% Methocarbamol, 2.5% Prilocaine. Sig: Apply 1-2 gm(s) (2-4 pumps) to affected area, 3-4 times/day. (1 pump = 0.5 gm) Dispense: 120 gm Pump Bottle. Dispenser: 1 pump = 0.5 gm.   OXYCODONE (OXY IR/ROXICODONE) 5 MG IMMEDIATE RELEASE TABLET    Take 1-2 tablets (5-10 mg total) by mouth every 4 (four) hours as needed for severe pain or breakthrough pain.   POTASSIUM CHLORIDE SA (K-DUR,KLOR-CON) 20 MEQ TABLET    Take 1 tablet (20 mEq total) by mouth 2 (two) times daily.   TRAMADOL (ULTRAM) 50 MG TABLET    Take 1-2 tablets (50-100 mg total) by mouth every 4 (four) hours as needed for moderate pain.   TRAZODONE (DESYREL) 50 MG TABLET    Take 0.5-1 tablets (25-50 mg total) by mouth at bedtime as needed for sleep.    Review of Systems  Constitutional: Negative.   Respiratory: Negative.   Cardiovascular: Negative.     Social History  Substance Use Topics  . Smoking status: Never Smoker  . Smokeless tobacco: Never Used  . Alcohol use 0.0 oz/week     Comment: Rarely   Objective:   BP 130/88 (BP Location: Right Arm, Patient Position: Sitting, Cuff Size: Large)   Pulse 61   Temp 98.1 F (36.7 C) (Oral)   Wt 219 lb (99.3 kg)   SpO2 97%   BMI 38.79 kg/m   Physical Exam  Constitutional: She is oriented to person, place, and time. She appears well-developed and well-nourished. No distress.  HENT:  Head: Normocephalic and atraumatic.  Right Ear: Hearing normal.  Left Ear: Hearing normal.  Nose: Nose  normal.  Eyes: Conjunctivae and lids are normal. Right eye exhibits no discharge. Left eye exhibits no discharge. No scleral icterus.  Neck: Neck supple.  Cardiovascular: Normal rate and regular rhythm.   Pulmonary/Chest: Effort normal and breath sounds normal. No respiratory distress.  Abdominal: Soft. Bowel sounds are normal.  Musculoskeletal: Normal range of motion. She exhibits tenderness.  Some soreness remains in the left knee s/p total knee replacement. Well healed scar without significant erythema. Flexion to 100 degrees and slight extension decrease by 10-15 degrees. Good pulses with mild edema.  Neurological: She is alert and oriented to person, place, and time.  Burning pains in the right side of face, along the nose and from frontal to parietal region.  Skin: Skin is intact. No lesion and no rash noted.  Psychiatric: She has a normal mood and affect. Her  speech is normal and behavior is normal. Thought content normal.      Assessment & Plan:      1. Status post total bilateral knee replacement Hospitalized on 12-01-16 for left knee total joint replacement by Dr. Marry Guan (orthopedist) due to severe degenerative arthrosis. Was discharged on 12-04-16 for rehab at [facility] and transferred to [facility] and eventually discharged home. Presently has home health PT and walking with one cane on the right. (Right knee replaced in 2014). Not back to full extension but flexion to 100 degrees now. Will continue therapy and follow up with Dr. Marry Guan as planned.   2. Hypokalemia Has been on KCL 20 meq BID since 12-02-16 when K+ was 3.2 during hospitalization for knee replacement. Will check CMP. No muscle cramps, palpitations or weakness. - Comprehensive metabolic panel  3. Neuropathic postherpetic trigeminal neuralgia (Location of Primary Source of Pain) (Right) (V1) Right facial and scalp burning pains for the past 6 years after having herpetic infection. Still using Gabapentin 800 mg QID  and followed by Dr. Dossie Arbour (pain management). She will contact Dr. Dossie Arbour to see if he has further recommendations. States injection have not helped in the past. Use of Oxycodone s/p left knee joint replacement helped ease pain but she does not want to take it.  4. Essential hypertension Well controlled with use of Amlodipine 5 mg qd and HCTZ 12.5 mg 2 qd. Denies chest pain, dyspnea or palpitations. Will recheck CBC and CMP. Follow up pending reports. - CBC with Differential/Platelet - Comprehensive metabolic panel  5. Uncomplicated asthma, unspecified asthma severity, unspecified whether persistent Well controlled with use of Breo daily. Rarely has to use the Albuterol-HFA. No congestion or cough. Continue present regimen and recheck prn. - fluticasone furoate-vilanterol (BREO ELLIPTA) 100-25 MCG/INH AEPB; Inhale 1 puff into the lungs daily.  Dispense: 28 each; Refill: 6

## 2017-01-13 DIAGNOSIS — Z96652 Presence of left artificial knee joint: Secondary | ICD-10-CM | POA: Diagnosis not present

## 2017-01-14 DIAGNOSIS — M25562 Pain in left knee: Secondary | ICD-10-CM | POA: Diagnosis not present

## 2017-01-14 DIAGNOSIS — Z96652 Presence of left artificial knee joint: Secondary | ICD-10-CM | POA: Diagnosis not present

## 2017-01-14 DIAGNOSIS — M6281 Muscle weakness (generalized): Secondary | ICD-10-CM | POA: Diagnosis not present

## 2017-01-14 DIAGNOSIS — M25662 Stiffness of left knee, not elsewhere classified: Secondary | ICD-10-CM | POA: Diagnosis not present

## 2017-01-15 ENCOUNTER — Encounter: Payer: Self-pay | Admitting: Family Medicine

## 2017-01-15 DIAGNOSIS — I10 Essential (primary) hypertension: Secondary | ICD-10-CM | POA: Diagnosis not present

## 2017-01-15 DIAGNOSIS — E876 Hypokalemia: Secondary | ICD-10-CM | POA: Diagnosis not present

## 2017-01-16 DIAGNOSIS — Z96652 Presence of left artificial knee joint: Secondary | ICD-10-CM | POA: Diagnosis not present

## 2017-01-16 DIAGNOSIS — M6281 Muscle weakness (generalized): Secondary | ICD-10-CM | POA: Diagnosis not present

## 2017-01-16 DIAGNOSIS — M25662 Stiffness of left knee, not elsewhere classified: Secondary | ICD-10-CM | POA: Diagnosis not present

## 2017-01-16 DIAGNOSIS — M25562 Pain in left knee: Secondary | ICD-10-CM | POA: Diagnosis not present

## 2017-01-16 LAB — COMPREHENSIVE METABOLIC PANEL
ALBUMIN: 3.9 g/dL (ref 3.6–4.8)
ALK PHOS: 82 IU/L (ref 39–117)
ALT: 12 IU/L (ref 0–32)
AST: 17 IU/L (ref 0–40)
Albumin/Globulin Ratio: 1.3 (ref 1.2–2.2)
BUN / CREAT RATIO: 9 — AB (ref 12–28)
BUN: 8 mg/dL (ref 8–27)
Bilirubin Total: 0.4 mg/dL (ref 0.0–1.2)
CALCIUM: 9.2 mg/dL (ref 8.7–10.3)
CO2: 23 mmol/L (ref 20–29)
CREATININE: 0.89 mg/dL (ref 0.57–1.00)
Chloride: 102 mmol/L (ref 96–106)
GFR calc Af Amer: 78 mL/min/{1.73_m2} (ref >59)
GFR, EST NON AFRICAN AMERICAN: 67 mL/min/{1.73_m2} (ref >59)
GLOBULIN, TOTAL: 2.9 g/dL (ref 1.5–4.5)
GLUCOSE: 108 mg/dL — AB (ref 65–99)
Potassium: 3.5 mmol/L (ref 3.5–5.2)
Sodium: 143 mmol/L (ref 134–144)
Total Protein: 6.8 g/dL (ref 6.0–8.5)

## 2017-01-16 LAB — CBC WITH DIFFERENTIAL/PLATELET
BASOS ABS: 0.1 10*3/uL (ref 0.0–0.2)
Basos: 1 %
EOS (ABSOLUTE): 0.3 10*3/uL (ref 0.0–0.4)
EOS: 2 %
HEMATOCRIT: 41.2 % (ref 34.0–46.6)
HEMOGLOBIN: 12.9 g/dL (ref 11.1–15.9)
IMMATURE GRANULOCYTES: 1 %
Immature Grans (Abs): 0.1 10*3/uL (ref 0.0–0.1)
LYMPHS ABS: 2.9 10*3/uL (ref 0.7–3.1)
Lymphs: 27 %
MCH: 26.1 pg — ABNORMAL LOW (ref 26.6–33.0)
MCHC: 31.3 g/dL — AB (ref 31.5–35.7)
MCV: 83 fL (ref 79–97)
MONOCYTES: 7 %
Monocytes Absolute: 0.7 10*3/uL (ref 0.1–0.9)
NEUTROS PCT: 62 %
Neutrophils Absolute: 6.8 10*3/uL (ref 1.4–7.0)
Platelets: 356 10*3/uL (ref 150–379)
RBC: 4.95 x10E6/uL (ref 3.77–5.28)
RDW: 14.6 % (ref 12.3–15.4)
WBC: 10.8 10*3/uL (ref 3.4–10.8)

## 2017-01-20 DIAGNOSIS — M25662 Stiffness of left knee, not elsewhere classified: Secondary | ICD-10-CM | POA: Diagnosis not present

## 2017-01-20 DIAGNOSIS — M25562 Pain in left knee: Secondary | ICD-10-CM | POA: Diagnosis not present

## 2017-01-20 DIAGNOSIS — Z96652 Presence of left artificial knee joint: Secondary | ICD-10-CM | POA: Diagnosis not present

## 2017-01-20 DIAGNOSIS — M6281 Muscle weakness (generalized): Secondary | ICD-10-CM | POA: Diagnosis not present

## 2017-01-26 DIAGNOSIS — M25662 Stiffness of left knee, not elsewhere classified: Secondary | ICD-10-CM | POA: Diagnosis not present

## 2017-01-26 DIAGNOSIS — M6281 Muscle weakness (generalized): Secondary | ICD-10-CM | POA: Diagnosis not present

## 2017-01-26 DIAGNOSIS — M25562 Pain in left knee: Secondary | ICD-10-CM | POA: Diagnosis not present

## 2017-01-26 DIAGNOSIS — Z96652 Presence of left artificial knee joint: Secondary | ICD-10-CM | POA: Diagnosis not present

## 2017-01-29 DIAGNOSIS — M25662 Stiffness of left knee, not elsewhere classified: Secondary | ICD-10-CM | POA: Diagnosis not present

## 2017-01-29 DIAGNOSIS — Z96652 Presence of left artificial knee joint: Secondary | ICD-10-CM | POA: Diagnosis not present

## 2017-01-29 DIAGNOSIS — M6281 Muscle weakness (generalized): Secondary | ICD-10-CM | POA: Diagnosis not present

## 2017-01-29 DIAGNOSIS — M25562 Pain in left knee: Secondary | ICD-10-CM | POA: Diagnosis not present

## 2017-02-03 DIAGNOSIS — Z96652 Presence of left artificial knee joint: Secondary | ICD-10-CM | POA: Diagnosis not present

## 2017-02-03 DIAGNOSIS — M25562 Pain in left knee: Secondary | ICD-10-CM | POA: Diagnosis not present

## 2017-02-03 DIAGNOSIS — M25662 Stiffness of left knee, not elsewhere classified: Secondary | ICD-10-CM | POA: Diagnosis not present

## 2017-02-03 DIAGNOSIS — M6281 Muscle weakness (generalized): Secondary | ICD-10-CM | POA: Diagnosis not present

## 2017-02-05 DIAGNOSIS — M25562 Pain in left knee: Secondary | ICD-10-CM | POA: Diagnosis not present

## 2017-02-05 DIAGNOSIS — Z96652 Presence of left artificial knee joint: Secondary | ICD-10-CM | POA: Diagnosis not present

## 2017-02-05 DIAGNOSIS — M6281 Muscle weakness (generalized): Secondary | ICD-10-CM | POA: Diagnosis not present

## 2017-02-05 DIAGNOSIS — M25662 Stiffness of left knee, not elsewhere classified: Secondary | ICD-10-CM | POA: Diagnosis not present

## 2017-02-10 DIAGNOSIS — Z96652 Presence of left artificial knee joint: Secondary | ICD-10-CM | POA: Diagnosis not present

## 2017-02-10 DIAGNOSIS — M6281 Muscle weakness (generalized): Secondary | ICD-10-CM | POA: Diagnosis not present

## 2017-02-10 DIAGNOSIS — M25662 Stiffness of left knee, not elsewhere classified: Secondary | ICD-10-CM | POA: Diagnosis not present

## 2017-02-10 DIAGNOSIS — M25562 Pain in left knee: Secondary | ICD-10-CM | POA: Diagnosis not present

## 2017-02-12 DIAGNOSIS — M25562 Pain in left knee: Secondary | ICD-10-CM | POA: Diagnosis not present

## 2017-02-12 DIAGNOSIS — M25662 Stiffness of left knee, not elsewhere classified: Secondary | ICD-10-CM | POA: Diagnosis not present

## 2017-02-12 DIAGNOSIS — M6281 Muscle weakness (generalized): Secondary | ICD-10-CM | POA: Diagnosis not present

## 2017-02-12 DIAGNOSIS — Z96652 Presence of left artificial knee joint: Secondary | ICD-10-CM | POA: Diagnosis not present

## 2017-02-18 ENCOUNTER — Ambulatory Visit (INDEPENDENT_AMBULATORY_CARE_PROVIDER_SITE_OTHER): Payer: Medicare Other

## 2017-02-18 VITALS — BP 124/76 | HR 76 | Temp 97.7°F | Ht 63.0 in | Wt 218.4 lb

## 2017-02-18 DIAGNOSIS — Z0000 Encounter for general adult medical examination without abnormal findings: Secondary | ICD-10-CM | POA: Diagnosis not present

## 2017-02-18 NOTE — Patient Instructions (Signed)
[name] , Thank you for taking time to come for your Medicare Wellness Visit. I appreciate your ongoing commitment to your health goals. Please review the following plan we discussed and let me know if I can assist you in the future.   Screening recommendations/referrals: Colonoscopy: up to date Mammogram: up to date Bone Density: up to date Recommended yearly ophthalmology/optometry visit for glaucoma screening and checkup Recommended yearly dental visit for hygiene and checkup  Vaccinations: Influenza vaccine: declined Pneumococcal vaccine: declined Tdap vaccine: up to date Shingles vaccine: declined  Advanced directives: Advance directive discussed with you today. Even though you declined this today please call our office should you change your mind and we can give you the proper paperwork for you to fill out.  Conditions/risks identified: Obesity; Recommend decreasing soda intake to 1 a day and increase water intake 6 glasses of water a day.   Next appointment: 02/24/17 @ 8:30 AM   Preventive Care 68 Years and Older, Female Preventive care refers to lifestyle choices and visits with your health care provider that can promote health and wellness. What does preventive care include?  A yearly physical exam. This is also called an annual well check.  Dental exams once or twice a year.  Routine eye exams. Ask your health care provider how often you should have your eyes checked.  Personal lifestyle choices, including:  Daily care of your teeth and gums.  Regular physical activity.  Eating a healthy diet.  Avoiding tobacco and drug use.  Limiting alcohol use.  Practicing safe sex.  Taking low-dose aspirin every day.  Taking vitamin and mineral supplements as recommended by your health care provider. What happens during an annual well check? The services and screenings done by your health care provider during your annual well check will depend on your age, overall  health, lifestyle risk factors, and family history of disease. Counseling  Your health care provider may ask you questions about your:  Alcohol use.  Tobacco use.  Drug use.  Emotional well-being.  Home and relationship well-being.  Sexual activity.  Eating habits.  History of falls.  Memory and ability to understand (cognition).  Work and work Statistician.  Reproductive health. Screening  You may have the following tests or measurements:  Height, weight, and BMI.  Blood pressure.  Lipid and cholesterol levels. These may be checked every 5 years, or more frequently if you are over 68 years old.  Skin check.  Lung cancer screening. You may have this screening every year starting at age 68 if you have a 30-pack-year history of smoking and currently smoke or have quit within the past 15 years.  Fecal occult blood test (FOBT) of the stool. You may have this test every year starting at age 68.  Flexible sigmoidoscopy or colonoscopy. You may have a sigmoidoscopy every 5 years or a colonoscopy every 10 years starting at age 68.  Hepatitis C blood test.  Hepatitis B blood test.  Sexually transmitted disease (STD) testing.  Diabetes screening. This is done by checking your blood sugar (glucose) after you have not eaten for a while (fasting). You may have this done every 1-3 years.  Bone density scan. This is done to screen for osteoporosis. You may have this done starting at age 25.  Mammogram. This may be done every 1-2 years. Talk to your health care provider about how often you should have regular mammograms. Talk with your health care provider about your test results, treatment options, and if  necessary, the need for more tests. Vaccines  Your health care provider may recommend certain vaccines, such as:  Influenza vaccine. This is recommended every year.  Tetanus, diphtheria, and acellular pertussis (Tdap, Td) vaccine. You may need a Td booster every 10  years.  Zoster vaccine. You may need this after age 20.  Pneumococcal 13-valent conjugate (PCV13) vaccine. One dose is recommended after age 62.  Pneumococcal polysaccharide (PPSV23) vaccine. One dose is recommended after age 70. Talk to your health care provider about which screenings and vaccines you need and how often you need them. This information is not intended to replace advice given to you by your health care provider. Make sure you discuss any questions you have with your health care provider. Document Released: 06/29/2015 Document Revised: 02/20/2016 Document Reviewed: 04/03/2015 Elsevier Interactive Patient Education  2017 [place] Prevention in the Home Falls can cause injuries. They can happen to people of all ages. There are many things you can do to make your home safe and to help prevent falls. What can I do on the outside of my home?  Regularly fix the edges of walkways and driveways and fix any cracks.  Remove anything that might make you trip as you walk through a door, such as a raised step or threshold.  Trim any bushes or trees on the path to your home.  Use bright outdoor lighting.  Clear any walking paths of anything that might make someone trip, such as rocks or tools.  Regularly check to see if handrails are loose or broken. Make sure that both sides of any steps have handrails.  Any raised decks and porches should have guardrails on the edges.  Have any leaves, snow, or ice cleared regularly.  Use sand or salt on walking paths during winter.  Clean up any spills in your garage right away. This includes oil or grease spills. What can I do in the bathroom?  Use night lights.  Install grab bars by the toilet and in the tub and shower. Do not use towel bars as grab bars.  Use non-skid mats or decals in the tub or shower.  If you need to sit down in the shower, use a plastic, non-slip stool.  Keep the floor dry. Clean up any water that  spills on the floor as soon as it happens.  Remove soap buildup in the tub or shower regularly.  Attach bath mats securely with double-sided non-slip rug tape.  Do not have throw rugs and other things on the floor that can make you trip. What can I do in the bedroom?  Use night lights.  Make sure that you have a light by your bed that is easy to reach.  Do not use any sheets or blankets that are too big for your bed. They should not hang down onto the floor.  Have a firm chair that has side arms. You can use this for support while you get dressed.  Do not have throw rugs and other things on the floor that can make you trip. What can I do in the kitchen?  Clean up any spills right away.  Avoid walking on wet floors.  Keep items that you use a lot in easy-to-reach places.  If you need to reach something above you, use a strong step stool that has a grab bar.  Keep electrical cords out of the way.  Do not use floor polish or wax that makes floors slippery. If you must  use wax, use non-skid floor wax.  Do not have throw rugs and other things on the floor that can make you trip. What can I do with my stairs?  Do not leave any items on the stairs.  Make sure that there are handrails on both sides of the stairs and use them. Fix handrails that are broken or loose. Make sure that handrails are as long as the stairways.  Check any carpeting to make sure that it is firmly attached to the stairs. Fix any carpet that is loose or worn.  Avoid having throw rugs at the top or bottom of the stairs. If you do have throw rugs, attach them to the floor with carpet tape.  Make sure that you have a light switch at the top of the stairs and the bottom of the stairs. If you do not have them, ask someone to add them for you. What else can I do to help prevent falls?  Wear shoes that:  Do not have high heels.  Have rubber bottoms.  Are comfortable and fit you well.  Are closed at the  toe. Do not wear sandals.  If you use a stepladder:  Make sure that it is fully opened. Do not climb a closed stepladder.  Make sure that both sides of the stepladder are locked into place.  Ask someone to hold it for you, if possible.  Clearly mark and make sure that you can see:  Any grab bars or handrails.  First and last steps.  Where the edge of each step is.  Use tools that help you move around (mobility aids) if they are needed. These include:  Canes.  Walkers.  Scooters.  Crutches.  Turn on the lights when you go into a dark area. Replace any light bulbs as soon as they burn out.  Set up your furniture so you have a clear path. Avoid moving your furniture around.  If any of your floors are uneven, fix them.  If there are any pets around you, be aware of where they are.  Review your medicines with your doctor. Some medicines can make you feel dizzy. This can increase your chance of falling. Ask your doctor what other things that you can do to help prevent falls. This information is not intended to replace advice given to you by your health care provider. Make sure you discuss any questions you have with your health care provider. Document Released: 03/29/2009 Document Revised: 11/08/2015 Document Reviewed: 07/07/2014 Elsevier Interactive Patient Education  2017 [employer].

## 2017-02-18 NOTE — Progress Notes (Signed)
Subjective:   [name] is a 66 y.o. female who presents for an Initial Medicare Annual Wellness Visit.  Review of Systems    N/A  Cardiac Risk Factors include: advanced age (>38men, >66 women);obesity (BMI >30kg/m2);diabetes mellitus;dyslipidemia;hypertension     Objective:    Today's Vitals   02/18/17 0846 02/18/17 0852  BP: 124/76   Pulse: 76   Temp: 97.7 F (36.5 C)   TempSrc: Oral   Weight: 218 lb 6.4 oz (99.1 kg)   Height: 5\' 3"  (1.6 m)   PainSc: 0-No pain 0-No pain   Body mass index is 38.69 kg/m.   Current Medications (verified) Outpatient Encounter Prescriptions as of 02/18/2017  Medication Sig  . albuterol (PROVENTIL HFA;VENTOLIN HFA) 108 (90 Base) MCG/ACT inhaler Inhale 2 puffs into the lungs every 4 (four) hours as needed for wheezing or shortness of breath.  Marland Kitchen amLODipine (NORVASC) 5 MG tablet Take 1 tablet (5 mg total) by mouth daily. (Patient taking differently: Take 5 mg by mouth every evening. )  . Carboxymethylcellulose Sodium (LUBRICANT EYE DROPS OP) Place 1 drop into both eyes 4 (four) times daily as needed (FOR DRY EYES).  . fluticasone furoate-vilanterol (BREO ELLIPTA) 100-25 MCG/INH AEPB Inhale 1 puff into the lungs daily.  Marland Kitchen gabapentin (NEURONTIN) 800 MG tablet Take 1 tablet (800 mg total) by mouth every 6 (six) hours.  . hydrochlorothiazide (MICROZIDE) 12.5 MG capsule Take 25 mg by mouth every evening.   . lansoprazole (PREVACID) 15 MG capsule Take 15 mg by mouth daily as needed.  . Multiple Vitamin (MULTIVITAMIN WITH MINERALS) TABS tablet Take 1 tablet by mouth daily. WOMEN'S MULTIVITAMIN  . NONFORMULARY OR COMPOUNDED ITEM Compounded cream: 6% Gabapentin, 2.5% Lidocaine, 0.5% Meloxicam, 5% Methocarbamol, 2.5% Prilocaine. Sig: Apply 1-2 gm(s) (2-4 pumps) to affected area, 3-4 times/day. (1 pump = 0.5 gm) Dispense: 120 gm Pump Bottle. Dispenser: 1 pump = 0.5 gm.  . traZODone (DESYREL) 50 MG tablet Take 0.5-1 tablets (25-50 mg total) by mouth at  bedtime as needed for sleep. (Patient taking differently: Take 50 mg by mouth at bedtime as needed. )  . metoprolol tartrate (LOPRESSOR) 25 MG tablet Take 25 mg by mouth 2 (two) times daily.  . [DISCONTINUED] enoxaparin (LOVENOX) 30 MG/0.3ML injection Inject 0.4 mLs (40 mg total) into the skin daily.  . [DISCONTINUED] oxyCODONE (OXY IR/ROXICODONE) 5 MG immediate release tablet Take 1-2 tablets (5-10 mg total) by mouth every 4 (four) hours as needed for severe pain or breakthrough pain.  . [DISCONTINUED] potassium chloride SA (K-DUR,KLOR-CON) 20 MEQ tablet Take 1 tablet (20 mEq total) by mouth 2 (two) times daily.  . [DISCONTINUED] traMADol (ULTRAM) 50 MG tablet Take 1-2 tablets (50-100 mg total) by mouth every 4 (four) hours as needed for moderate pain.   No facility-administered encounter medications on file as of 02/18/2017.     Allergies (verified) Lisinopril; Lovastatin; and Shellfish allergy   History: Past Medical History:  Diagnosis Date  . Anxiety   . Arthritis   . Asthma   . Bronchitis 09/04/2016   urgent care  . Diabetes mellitus without complication ([place])   . GERD (gastroesophageal reflux disease)   . History of shingles   . Hyperlipidemia   . Hypertension   . Neuralgia    Right side of head and face  . Shingles    chronic on Rt side of head and face   Past Surgical History:  Procedure Laterality Date  . ABDOMINAL HYSTERECTOMY    . BREAST BIOPSY  Right 2016   benign  . BREAST BIOPSY Right 04/08/2016   neg- benign  . BREAST LUMPECTOMY Right 03/2016  . BREAST LUMPECTOMY WITH NEEDLE LOCALIZATION Right 05/02/2016   Procedure: BREAST LUMPECTOMY WITH NEEDLE LOCALIZATION;  Surgeon: Clayburn Pert, MD;  Location: [facility] ORS;  Service: General;  Laterality: Right;  . CHOLECYSTECTOMY    . KNEE ARTHROPLASTY Left 12/01/2016   Procedure: COMPUTER ASSISTED TOTAL KNEE ARTHROPLASTY;  Surgeon: Dereck Leep, MD;  Location: [facility] ORS;  Service: Orthopedics;  Laterality: Left;  .  TOTAL KNEE ARTHROPLASTY Right 2014  . TOTAL KNEE ARTHROPLASTY Right 02/16/2013   Family History  Problem Relation Age of Onset  . Anuerysm Mother        Brain  . Dementia Mother   . Heart failure Father   . CAD Father   . Dementia Father   . Diabetes Father   . Lupus Sister   . Diabetes Brother   . Throat cancer Maternal Grandfather   . Breast cancer Neg Hx    Social History   Occupational History  . Not on file.   Social History Main Topics  . Smoking status: Never Smoker  . Smokeless tobacco: Never Used  . Alcohol use No  . Drug use: No  . Sexual activity: Not on file    Tobacco Counseling Counseling given: Not Answered   Activities of Daily Living In your present state of health, do you have any difficulty performing the following activities: 02/18/2017 12/01/2016  Hearing? N N  Vision? Y N  Comment shingles in right eye -  Difficulty concentrating or making decisions? N N  Comment - -  Walking or climbing stairs? Y N  Comment post knee sx -  Dressing or bathing? N N  Doing errands, shopping? N N  Preparing Food and eating ? N -  Using the Toilet? N -  In the past six months, have you accidently leaked urine? N -  Do you have problems with loss of bowel control? N -  Managing your Medications? N -  Managing your Finances? N -  Housekeeping or managing your Housekeeping? N -  Some recent data might be hidden    Immunizations and Health Maintenance Immunization History  Administered Date(s) Administered  . Influenza Split 04/09/2010, 04/02/2012, 05/26/2012  . Influenza, High Dose Seasonal PF 04/04/2014, 02/29/2016  . Td 06/16/1998  . Tdap 07/25/2008   Health Maintenance Due  Topic Date Due  . FOOT EXAM  02/12/1959  . OPHTHALMOLOGY EXAM  02/12/1959  . URINE MICROALBUMIN  02/12/1959  . PNA vac Low Risk Adult (1 of 2 - PCV13) 02/11/2014  . INFLUENZA VACCINE  01/14/2017    Patient Care Team: Chrismon, Vickki Muff, PA as PCP - General (Family  Medicine)  Indicate any recent Medical Services you may have received from other than [facility] providers in the past year (date may be approximate).     Assessment:   This is a routine wellness examination for [place].   Hearing/Vision screen Vision Screening Comments: Pt [facility] for vision checks 3-4 times a year. Last OV was 1 year ago.   Dietary issues and exercise activities discussed: Current Exercise Habits: The patient does not participate in regular exercise at present, Exercise limited by: orthopedic condition(s)  Goals    None     Depression Screen PHQ 2/9 Scores 02/18/2017 10/29/2016 09/08/2016 08/04/2016 07/24/2016 04/02/2016 01/21/2016  PHQ - 2 Score 2 0 0 0 0 0 0  PHQ-  9 Score 5 - - - - - -    Fall Risk Fall Risk  02/18/2017 10/29/2016 09/08/2016 08/04/2016 07/24/2016  Falls in the past year? No No No No Yes  Number falls in past yr: - - - - 1  Injury with Fall? - - - - No  Risk for fall due to : - - - - (No Data)  Risk for fall due to: Comment - - - - slipped and fell on water.    Cognitive Function: Pt declined screening today.         Screening Tests Health Maintenance  Topic Date Due  . FOOT EXAM  02/12/1959  . OPHTHALMOLOGY EXAM  02/12/1959  . URINE MICROALBUMIN  02/12/1959  . PNA vac Low Risk Adult (1 of 2 - PCV13) 02/11/2014  . INFLUENZA VACCINE  01/14/2017  . HEMOGLOBIN A1C  06/03/2017  . TETANUS/TDAP  07/25/2018  . MAMMOGRAM  11/05/2018  . COLONOSCOPY  12/15/2018  . DEXA SCAN  Completed  . Hepatitis C Screening  Completed      Plan:  I have personally reviewed and addressed the Medicare Annual Wellness questionnaire and have noted the following in the patient's chart:  A. Medical and social history B. Use of alcohol, tobacco or illicit drugs  C. Current medications and supplements D. Functional ability and status E.  Nutritional status F.  Physical activity G. Advance directives H. List of other physicians I.  Hospitalizations,  surgeries, and ER visits in previous 12 months J.  [place] such as hearing and vision if needed, cognitive and depression L. Referrals and appointments - none  In addition, I have reviewed and discussed with patient certain preventive protocols, quality metrics, and best practice recommendations. A written personalized care plan for preventive services as well as general preventive health recommendations were provided to patient.  See attached scanned questionnaire for additional information.   Signed,  Fabio Neighbors, LPN Nurse Health Advisor   MD Recommendations: Pt declined Prevnar 65 and influenza vaccine today. Pt states she will receive this at her next OV with PCP. Pt needs urine microalbumin checked as well- unable to obtain today due to coding.

## 2017-02-20 DIAGNOSIS — Z96652 Presence of left artificial knee joint: Secondary | ICD-10-CM | POA: Diagnosis not present

## 2017-02-20 DIAGNOSIS — M25662 Stiffness of left knee, not elsewhere classified: Secondary | ICD-10-CM | POA: Diagnosis not present

## 2017-02-20 DIAGNOSIS — M25562 Pain in left knee: Secondary | ICD-10-CM | POA: Diagnosis not present

## 2017-02-20 DIAGNOSIS — M6281 Muscle weakness (generalized): Secondary | ICD-10-CM | POA: Diagnosis not present

## 2017-02-23 DIAGNOSIS — M25562 Pain in left knee: Secondary | ICD-10-CM | POA: Diagnosis not present

## 2017-02-23 DIAGNOSIS — M6281 Muscle weakness (generalized): Secondary | ICD-10-CM | POA: Diagnosis not present

## 2017-02-23 DIAGNOSIS — Z96652 Presence of left artificial knee joint: Secondary | ICD-10-CM | POA: Diagnosis not present

## 2017-02-23 DIAGNOSIS — M25662 Stiffness of left knee, not elsewhere classified: Secondary | ICD-10-CM | POA: Diagnosis not present

## 2017-02-24 ENCOUNTER — Telehealth: Payer: Self-pay

## 2017-02-24 ENCOUNTER — Ambulatory Visit (INDEPENDENT_AMBULATORY_CARE_PROVIDER_SITE_OTHER): Payer: Medicare Other | Admitting: Family Medicine

## 2017-02-24 ENCOUNTER — Encounter: Payer: Self-pay | Admitting: Family Medicine

## 2017-02-24 VITALS — BP 130/94 | HR 80 | Temp 97.6°F | Resp 16 | Wt 217.0 lb

## 2017-02-24 DIAGNOSIS — E0800 Diabetes mellitus due to underlying condition with hyperosmolarity without nonketotic hyperglycemic-hyperosmolar coma (NKHHC): Secondary | ICD-10-CM | POA: Diagnosis not present

## 2017-02-24 DIAGNOSIS — E785 Hyperlipidemia, unspecified: Secondary | ICD-10-CM

## 2017-02-24 DIAGNOSIS — B0229 Other postherpetic nervous system involvement: Secondary | ICD-10-CM | POA: Diagnosis not present

## 2017-02-24 DIAGNOSIS — Z23 Encounter for immunization: Secondary | ICD-10-CM | POA: Diagnosis not present

## 2017-02-24 DIAGNOSIS — I10 Essential (primary) hypertension: Secondary | ICD-10-CM | POA: Diagnosis not present

## 2017-02-24 DIAGNOSIS — E7800 Pure hypercholesterolemia, unspecified: Secondary | ICD-10-CM

## 2017-02-24 DIAGNOSIS — E669 Obesity, unspecified: Secondary | ICD-10-CM | POA: Diagnosis not present

## 2017-02-24 LAB — POCT GLYCOSYLATED HEMOGLOBIN (HGB A1C)
Est. average glucose Bld gHb Est-mCnc: 137
Hemoglobin A1C: 6.4

## 2017-02-24 LAB — POCT UA - MICROALBUMIN: Microalbumin Ur, POC: 50 mg/L

## 2017-02-24 MED ORDER — AMLODIPINE BESYLATE 5 MG PO TABS: 5.0000 mg | ORAL_TABLET | Freq: Every day | ORAL | 3 refills | Status: DC

## 2017-02-24 MED ORDER — HYDROCHLOROTHIAZIDE 25 MG PO TABS: 25.0000 mg | ORAL_TABLET | Freq: Every day | ORAL | 3 refills | Status: DC

## 2017-02-24 MED ORDER — NORTRIPTYLINE HCL 25 MG PO CAPS: 25.0000 mg | ORAL_CAPSULE | Freq: Every day | ORAL | 0 refills | Status: DC

## 2017-02-24 NOTE — Addendum Note (Signed)
Addended by: Brennan Bailey on: 02/24/2017 10:49 AM   Modules accepted: Orders

## 2017-02-24 NOTE — Patient Instructions (Signed)
Diabetes Mellitus and Food It is important for you to manage your blood sugar (glucose) level. Your blood glucose level can be greatly affected by what you eat. Eating healthier foods in the appropriate amounts throughout the day at about the same time each day will help you control your blood glucose level. It can also help slow or prevent worsening of your diabetes mellitus. Healthy eating may even help you improve the level of your blood pressure and reach or maintain a healthy weight. General recommendations for healthful eating and cooking habits include:  Eating meals and snacks regularly. Avoid going long periods of time without eating to lose weight.  Eating a diet that consists mainly of plant-based foods, such as fruits, vegetables, nuts, legumes, and whole grains.  Using low-heat cooking methods, such as baking, instead of high-heat cooking methods, such as deep frying.  Work with your dietitian to make sure you understand how to use the Nutrition Facts information on food labels. How can food affect me? Carbohydrates Carbohydrates affect your blood glucose level more than any other type of food. Your dietitian will help you determine how many carbohydrates to eat at each meal and teach you how to count carbohydrates. Counting carbohydrates is important to keep your blood glucose at a healthy level, especially if you are using insulin or taking certain medicines for diabetes mellitus. Alcohol Alcohol can cause sudden decreases in blood glucose (hypoglycemia), especially if you use insulin or take certain medicines for diabetes mellitus. Hypoglycemia can be a life-threatening condition. Symptoms of hypoglycemia (sleepiness, dizziness, and disorientation) are similar to symptoms of having too much alcohol. If your health care provider has given you approval to drink alcohol, do so in moderation and use the following guidelines:  Women should not have more than one drink per day, and men  should not have more than two drinks per day. One drink is equal to: ? 12 oz of beer. ? 5 oz of wine. ? 1 oz of hard liquor.  Do not drink on an empty stomach.  Keep yourself hydrated. Have water, diet soda, or unsweetened iced tea.  Regular soda, juice, and other mixers might contain a lot of carbohydrates and should be counted.  What foods are not recommended? As you make food choices, it is important to remember that all foods are not the same. Some foods have fewer nutrients per serving than other foods, even though they might have the same number of calories or carbohydrates. It is difficult to get your body what it needs when you eat foods with fewer nutrients. Examples of foods that you should avoid that are high in calories and carbohydrates but low in nutrients include:  Trans fats (most processed foods list trans fats on the Nutrition Facts label).  Regular soda.  Juice.  Candy.  Sweets, such as cake, pie, doughnuts, and cookies.  Fried foods.  What foods can I eat? Eat nutrient-rich foods, which will nourish your body and keep you healthy. The food you should eat also will depend on several factors, including:  The calories you need.  The medicines you take.  Your weight.  Your blood glucose level.  Your blood pressure level.  Your cholesterol level.  You should eat a variety of foods, including:  Protein. ? Lean cuts of meat. ? Proteins low in saturated fats, such as fish, egg whites, and beans. Avoid processed meats.  Fruits and vegetables. ? Fruits and vegetables that may help control blood glucose levels, such as apples,   mangoes, and yams.  Dairy products. ? Choose fat-free or low-fat dairy products, such as milk, yogurt, and cheese.  Grains, bread, pasta, and rice. ? Choose whole grain products, such as multigrain bread, whole oats, and brown rice. These foods may help control blood pressure.  Fats. ? Foods containing healthful fats, such as  nuts, avocado, olive oil, canola oil, and fish.  Does everyone with diabetes mellitus have the same meal plan? Because every person with diabetes mellitus is different, there is not one meal plan that works for everyone. It is very important that you meet with a dietitian who will help you create a meal plan that is just right for you. This information is not intended to replace advice given to you by your health care provider. Make sure you discuss any questions you have with your health care provider. Document Released: 02/27/2005 Document Revised: 11/08/2015 Document Reviewed: 04/29/2013 Elsevier Interactive Patient Education  2017 Elsevier Inc.  

## 2017-02-24 NOTE — Telephone Encounter (Signed)
-----   Message from Virginia Crews, MD sent at 02/24/2017 11:22 AM EDT ----- Hemoglobin A1c remains controlled at 6.4.  Continue low carb diet.  Urine shows microalbuminuria which can be caused by microscopic damage to the kidneys by diabetes.  To treat this and protect the kidneys, we actually treat with a blood pressure medication.  I would recommend stopping amlodipine and instead starting lisinopril 10mg  daily.  It is ok to send Rx to her mail order pharmacy for #90 r3 if patient is agreeable.  It is important to stop the amlodipine if she starts the lisinopril.  We will recheck her electrolytes and blood pressure at follow up in 1 month.  Virginia Crews, MD, MPH [facility] 02/24/2017 11:22 AM

## 2017-02-24 NOTE — Addendum Note (Signed)
Addended by: Brennan Bailey on: 02/24/2017 10:58 AM   Modules accepted: Orders

## 2017-02-24 NOTE — Assessment & Plan Note (Signed)
Above goal today but hasnt taken any medications yet Recent CMP wnl Continue amlodipine 5mg  and HCTZ 25 mg daily F/u in 3 months

## 2017-02-24 NOTE — Assessment & Plan Note (Signed)
Recheck A1c and Urine microalbumin UTD on foot exam Advised scheduling annual eye exam Advised on diet and exercise

## 2017-02-24 NOTE — Assessment & Plan Note (Signed)
Recheck lipid panel 

## 2017-02-24 NOTE — Telephone Encounter (Signed)
Pt advised of results. She can not take lisinopril due to cough. Are there any alternate medications she can take?

## 2017-02-24 NOTE — Assessment & Plan Note (Signed)
Discussed diet and exercise 

## 2017-02-24 NOTE — Progress Notes (Signed)
Patient: [name] Female    DOB: 01-Jun-1951   66 y.o.   MRN: [id number] Visit Date: 02/24/2017  Today's Provider: Lavon Paganini, MD   Chief Complaint  Patient presents with  . Diabetes  . Hypertension   Subjective:    HPI   [name] is here to follow up on her annual wellness visit form 02/18/2017. Her medical history includes DM, HTN, hyperlipidemia, zoster ocular disease. She is due for an eye exam. She does not check BS or BP at home. She is due for labs today, including a urine microalbumin. She is taking Amlodipine 5 mg qhs and HCTZ 25 mg po qd for HTN. Metoprolol 25 mg BID id on her med list, but she states she is not taking this medication.  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 Lisinopril Cough  . Lovastatin Other (See Comments)    Myalgia  . Shellfish Allergy Rash     Current Outpatient Prescriptions:  .  albuterol (PROVENTIL HFA;VENTOLIN HFA) 108 (90 Base) MCG/ACT inhaler, Inhale 2 puffs into the lungs every 4 (four) hours as needed for wheezing or shortness of breath., Disp: 1 Inhaler, Rfl: 0 .  amLODipine (NORVASC) 5 MG tablet, Take 1 tablet (5 mg total) by mouth daily., Disp: 90 tablet, Rfl: 3 .  Carboxymethylcellulose Sodium (LUBRICANT EYE DROPS OP), Place 1 drop into both eyes 4 (four) times daily as needed (FOR DRY EYES)., Disp: , Rfl:  .  fluticasone furoate-vilanterol (BREO ELLIPTA) 100-25 MCG/INH AEPB, Inhale 1 puff into the lungs daily., Disp: 28 each, Rfl: 6 .  gabapentin (NEURONTIN) 800 MG  tablet, Take 1 tablet (800 mg total) by mouth every 6 (six) hours., Disp: 360 tablet, Rfl: 1 .  lansoprazole (PREVACID) 15 MG capsule, Take 15 mg by mouth daily as needed., Disp: , Rfl:  .  NONFORMULARY OR COMPOUNDED ITEM, Compounded cream: 6% Gabapentin, 2.5% Lidocaine, 0.5% Meloxicam, 5% Methocarbamol, 2.5% Prilocaine. Sig: Apply 1-2 gm(s) (2-4 pumps) to affected area, 3-4 times/day. (1 pump = 0.5 gm) Dispense: 120 gm Pump Bottle. Dispenser: 1 pump = 0.5 gm., Disp: 1 each, Rfl: PRN .  hydrochlorothiazide (HYDRODIURIL) 25 MG tablet, Take 1 tablet (25 mg total) by mouth daily., Disp: 90 tablet, Rfl: 3 .  Multiple Vitamin (MULTIVITAMIN WITH MINERALS) TABS tablet, Take 1 tablet by mouth daily. WOMEN'S MULTIVITAMIN, Disp: , Rfl:  .  nortriptyline (PAMELOR) 25 MG capsule, Take 1 capsule (25 mg total) by mouth at bedtime., Disp: 90 capsule, Rfl: 0  Review of Systems  Constitutional: Positive for chills. Negative for activity change, appetite change, diaphoresis, fatigue, fever and unexpected weight change.  HENT: Negative.   Eyes: Negative.   Respiratory: Negative for chest tightness and shortness of breath.   Cardiovascular: Positive for leg swelling. Negative for chest pain and palpitations.  Gastrointestinal: Negative.   Endocrine: Negative for polydipsia, polyphagia and polyuria.  Genitourinary: Negative.   Musculoskeletal: Negative.   Neurological: Negative.   Psychiatric/Behavioral: Negative.  Social History  Substance Use Topics  . Smoking status: Never Smoker  . Smokeless tobacco: Never Used  . Alcohol use No   Objective:   BP (!) 130/94 (BP Location: Left Arm, Patient Position: Sitting, Cuff Size: Large)   Pulse 80   Temp 97.6 F (36.4 C) (Oral)   Resp 16   Wt 217 lb (98.4 kg)   BMI 38.44 kg/m  Vitals:   02/24/17 0854  BP: (!) 130/94  Pulse: 80  Resp: 16  Temp: 97.6 F (36.4 C)  TempSrc: Oral  Weight: 217 lb (98.4 kg)     Physical Exam  Constitutional: She is  oriented to person, place, and time. She appears well-developed and well-nourished. No distress.  HENT:  Head: Normocephalic and atraumatic.  Eyes: Conjunctivae are normal. No scleral icterus.  Neck: Neck supple.  Cardiovascular: Normal rate, regular rhythm, normal heart sounds and intact distal pulses.   No murmur heard. Pulmonary/Chest: Effort normal and breath sounds normal. No respiratory distress. She has no wheezes. She has no rales.  Abdominal: Soft. She exhibits no distension. There is no tenderness.  Musculoskeletal:  LLE 1-2+ pitting edema, RLE trace edema  Lymphadenopathy:    She has no cervical adenopathy.  Neurological: She is alert and oriented to person, place, and time.  Skin: Skin is warm and dry. No rash noted.  Psychiatric: She has a normal mood and affect. Her behavior is normal.  Vitals reviewed.     Assessment & Plan:      Problem List Items Addressed This Visit      Cardiovascular and Mediastinum   Hypertension - Primary    Above goal today but hasnt taken any medications yet Recent CMP wnl Continue amlodipine 5mg  and HCTZ 25 mg daily F/u in 3 months      Relevant Medications   amLODipine (NORVASC) 5 MG tablet   hydrochlorothiazide (HYDRODIURIL) 25 MG tablet     Endocrine   Diabetes (HCC)    Recheck A1c and Urine microalbumin UTD on foot exam Advised scheduling annual eye exam Advised on diet and exercise        Nervous and Auditory   Postherpetic neuralgia (Chronic)    Chronic pain as an effect of postherpetic neuralgia Continue gabapentin Advised against chronic narcotics Trial of nortriptyline - d/c trazodone F/u in 1 month      Relevant Medications   nortriptyline (PAMELOR) 25 MG capsule     Other   Obesity (BMI 35.0-39.9 without comorbidity) (HCC)    Discussed diet and exercise      Hypercholesterolemia without hypertriglyceridemia    Recheck lipid panel      Relevant Medications   amLODipine (NORVASC) 5 MG tablet    hydrochlorothiazide (HYDRODIURIL) 25 MG tablet    Other Visit Diagnoses    Hyperlipidemia, unspecified hyperlipidemia type       Relevant Medications   amLODipine (NORVASC) 5 MG tablet   hydrochlorothiazide (HYDRODIURIL) 25 MG tablet   Other Relevant Orders   Lipid panel          The entirety of the information documented in the History of Present Illness, Review of Systems and Physical Exam were personally obtained by me. Portions of this information were initially documented by Raquel Sarna Ratchford, CMA and reviewed by me for thoroughness and accuracy.     Lavon Paganini, MD  [facility]

## 2017-02-24 NOTE — Assessment & Plan Note (Signed)
Chronic pain as an effect of postherpetic neuralgia Continue gabapentin Advised against chronic narcotics Trial of nortriptyline - d/c trazodone F/u in 1 month

## 2017-02-25 MED ORDER — LOSARTAN POTASSIUM 25 MG PO TABS: 25.0000 mg | ORAL_TABLET | Freq: Every day | ORAL | 3 refills | Status: DC

## 2017-02-25 NOTE — Telephone Encounter (Signed)
Pt advised.

## 2017-02-25 NOTE — Telephone Encounter (Signed)
Meds ordered this encounter  Medications  . losartan (COZAAR) 25 MG tablet    Sig: Take 1 tablet (25 mg total) by mouth daily.    Dispense:  90 tablet    Refill:  3   Losartan works similarly to protect the kidneys but does not cause cough like lisinopril.  Virginia Crews, MD, MPH [facility] 02/25/2017 9:25 AM

## 2017-03-26 ENCOUNTER — Ambulatory Visit (INDEPENDENT_AMBULATORY_CARE_PROVIDER_SITE_OTHER): Payer: Medicare Other | Admitting: Family Medicine

## 2017-03-26 ENCOUNTER — Encounter: Payer: Self-pay | Admitting: Family Medicine

## 2017-03-26 VITALS — BP 102/80 | HR 97 | Temp 97.9°F | Resp 16 | Wt 216.0 lb

## 2017-03-26 DIAGNOSIS — M792 Neuralgia and neuritis, unspecified: Secondary | ICD-10-CM

## 2017-03-26 DIAGNOSIS — R809 Proteinuria, unspecified: Secondary | ICD-10-CM | POA: Insufficient documentation

## 2017-03-26 DIAGNOSIS — E1129 Type 2 diabetes mellitus with other diabetic kidney complication: Secondary | ICD-10-CM | POA: Insufficient documentation

## 2017-03-26 DIAGNOSIS — E7800 Pure hypercholesterolemia, unspecified: Secondary | ICD-10-CM

## 2017-03-26 DIAGNOSIS — B0229 Other postherpetic nervous system involvement: Secondary | ICD-10-CM

## 2017-03-26 DIAGNOSIS — B0222 Postherpetic trigeminal neuralgia: Secondary | ICD-10-CM | POA: Diagnosis not present

## 2017-03-26 DIAGNOSIS — I10 Essential (primary) hypertension: Secondary | ICD-10-CM

## 2017-03-26 LAB — COMPLETE METABOLIC PANEL WITH GFR
AG Ratio: 1.1 (calc) (ref 1.0–2.5)
ALT: 7 U/L (ref 6–29)
AST: 11 U/L (ref 10–35)
Albumin: 3.7 g/dL (ref 3.6–5.1)
Alkaline phosphatase (APISO): 82 U/L (ref 33–130)
BUN: 13 mg/dL (ref 7–25)
CALCIUM: 9.3 mg/dL (ref 8.6–10.4)
CO2: 31 mmol/L (ref 20–32)
CREATININE: 0.96 mg/dL (ref 0.50–0.99)
Chloride: 99 mmol/L (ref 98–110)
GFR, EST NON AFRICAN AMERICAN: 61 mL/min/{1.73_m2} (ref >=60)
GFR, Est African American: 70 mL/min/{1.73_m2} (ref >=60)
GLUCOSE: 96 mg/dL (ref 65–99)
Globulin: 3.4 g/dL (calc) (ref 1.9–3.7)
POTASSIUM: 4 mmol/L (ref 3.5–5.3)
Sodium: 138 mmol/L (ref 135–146)
TOTAL PROTEIN: 7.1 g/dL (ref 6.1–8.1)
Total Bilirubin: 0.4 mg/dL (ref 0.2–1.2)

## 2017-03-26 LAB — LIPID PANEL
CHOL/HDL RATIO: 4 (calc) (ref <5.0)
Cholesterol: 182 mg/dL (ref <200)
HDL: 46 mg/dL — ABNORMAL LOW (ref >50)
LDL Cholesterol (Calc): 111 mg/dL (calc) — ABNORMAL HIGH
NON-HDL CHOLESTEROL (CALC): 136 mg/dL — AB (ref <130)
Triglycerides: 132 mg/dL (ref <150)

## 2017-03-26 MED ORDER — GABAPENTIN 600 MG PO TABS: 900.0000 mg | ORAL_TABLET | Freq: Four times a day (QID) | ORAL | 3 refills | Status: DC

## 2017-03-26 MED ORDER — LOSARTAN POTASSIUM 25 MG PO TABS: 12.5000 mg | ORAL_TABLET | Freq: Every day | ORAL | 3 refills | Status: DC

## 2017-03-26 NOTE — Assessment & Plan Note (Signed)
Continues to have pain in R V1 distribution Increase gabapentin to max dose of 3600 mg daily Can consider increasing nortriptyline or switching to Lyrica in the future

## 2017-03-26 NOTE — Assessment & Plan Note (Addendum)
Well controlled May be a bit low for elderly patient and may be contributing to fatigue Continue HCTZ Decrease losartan to 12.5 mg daily Recheck CMP to ensure Cr and K ok after starting ARB F/u in 3 months

## 2017-03-26 NOTE — Patient Instructions (Signed)
Increase gabapentin to 900mg  four times daily  Decrease losartan to 12.5 (0.5 tab) daily

## 2017-03-26 NOTE — Progress Notes (Signed)
Patient: [name] Female    DOB: 1950-11-10   66 y.o.   MRN: [id number] Visit Date: 03/26/2017    Today's Provider: Lavon Paganini, MD   Chief Complaint  Patient presents with  . Hypertension    1 mo f/u   Subjective:    HPI  Hypertension, follow-up:  BP Readings from Last 3 Encounters:  03/26/17 102/80  02/24/17 (!) 130/94  02/18/17 124/76    She was last seen for hypertension 1 months ago.  BP at that visit was 130/94. Management since that visit includes stopping amlodipine and instead starting losartan 25mg  daily . She reports good compliance with treatment. She is not having side effects.  She is not exercising. She is adherent to low salt diet.   Outside blood pressures are not being checked. She is experiencing fatigue and lower extremity edema.  Patient denies none.   Cardiovascular risk factors include hypertension and obesity (BMI >= 30 kg/m2).  Use of agents associated with hypertension: none.     Weight trend: stable Wt Readings from Last 3 Encounters:  03/26/17 216 lb (98 kg)  02/24/17 217 lb (98.4 kg)  02/18/17 218 lb 6.4 oz (99.1 kg)    Current diet: well balanced  ------------------------------------------------------------------------ Postherpetic neuralgia of R face after episode of herpes zoster ophthalmicus - sleeping better on nortriptyline, but doesn't seem to be helping with pain - gabapentin seems to help, but not completely - no vision changes, eye redness, eye pain - burning, itching pain of R V1 distribution  HLD - not taking a statin - was supposed to get future labs after last visit, but did not - no myalgias, CP, SOB    Allergies  Allergen Reactions  . Lisinopril Cough  . Lovastatin Other (See Comments)    Myalgia  . Shellfish Allergy Rash     Current Outpatient Prescriptions:  .  albuterol (PROVENTIL HFA;VENTOLIN HFA) 108 (90 Base) MCG/ACT inhaler, Inhale 2 puffs into the lungs every 4 (four) hours  as needed for wheezing or shortness of breath., Disp: 1 Inhaler, Rfl: 0 .  Carboxymethylcellulose Sodium (LUBRICANT EYE DROPS OP), Place 1 drop into both eyes 4 (four) times daily as needed (FOR DRY EYES)., Disp: , Rfl:  .  fluticasone furoate-vilanterol (BREO ELLIPTA) 100-25 MCG/INH AEPB, Inhale 1 puff into the lungs daily., Disp: 28 each, Rfl: 6 .  gabapentin (NEURONTIN) 800 MG tablet, Take 1 tablet (800 mg total) by mouth every 6 (six) hours., Disp: 360 tablet, Rfl: 1 .  hydrochlorothiazide (HYDRODIURIL) 25 MG tablet, Take 1 tablet (25 mg total) by mouth daily., Disp: 90 tablet, Rfl: 3 .  lansoprazole (PREVACID) 15 MG capsule, Take 15 mg by mouth daily as needed., Disp: , Rfl:  .  losartan (COZAAR) 25 MG tablet, Take 1 tablet (25 mg total) by mouth daily., Disp: 90 tablet, Rfl: 3 .  Multiple Vitamin (MULTIVITAMIN WITH MINERALS) TABS tablet, Take 1 tablet by mouth daily. WOMEN'S MULTIVITAMIN, Disp: , Rfl:  .  NONFORMULARY OR COMPOUNDED ITEM, Compounded cream: 6% Gabapentin, 2.5% Lidocaine, 0.5% Meloxicam, 5% Methocarbamol, 2.5% Prilocaine. Sig: Apply 1-2 gm(s) (2-4 pumps) to affected area, 3-4 times/day. (1 pump = 0.5 gm) Dispense: 120 gm Pump Bottle. Dispenser: 1 pump = 0.5 gm., Disp: 1 each, Rfl: PRN .  nortriptyline (PAMELOR) 25 MG capsule, Take 1 capsule (25 mg total) by mouth at bedtime., Disp: 90 capsule, Rfl: 0  Review of Systems  Constitutional: Positive for fatigue. Negative for appetite change,  chills and fever.  HENT: Negative.   Eyes: Negative.   Respiratory: Negative for chest tightness and shortness of breath.   Cardiovascular: Positive for leg swelling (occasionally). Negative for chest pain and palpitations.  Gastrointestinal: Negative for abdominal pain, nausea and vomiting.  Genitourinary: Negative.   Neurological: Negative for dizziness and weakness.  Psychiatric/Behavioral: Negative.     Social History  Substance Use Topics  . Smoking status: Never Smoker  . Smokeless  tobacco: Never Used  . Alcohol use No   Objective:   BP 102/80 (BP Location: Right Arm, Cuff Size: Large)   Pulse 97   Temp 97.9 F (36.6 C) (Oral)   Resp 16   Wt 216 lb (98 kg)   SpO2 99% Comment: room air  BMI 38.26 kg/m  Vitals:   03/26/17 0834 03/26/17 0836  BP: 104/80 102/80  Pulse: 97   Resp: 16   Temp: 97.9 F (36.6 C)   TempSrc: Oral   SpO2: 99%   Weight: 216 lb (98 kg)      Physical Exam  Constitutional: She is oriented to person, place, and time. She appears well-developed and well-nourished. No distress.  HENT:  Head: Normocephalic and atraumatic.  Right Ear: External ear normal.  Left Ear: External ear normal.  Nose: Nose normal.  Mouth/Throat: Oropharynx is clear and moist.  Eyes: Pupils are equal, round, and reactive to light. Conjunctivae are normal. No scleral icterus.  Neck: Neck supple.  Cardiovascular: Normal rate, regular rhythm, normal heart sounds and intact distal pulses.   No murmur heard. Pulmonary/Chest: Effort normal and breath sounds normal. No respiratory distress. She has no wheezes. She has no rales.  Musculoskeletal: She exhibits no edema or deformity.  Neurological: She is alert and oriented to person, place, and time.  Skin: Skin is warm and dry. No rash noted.  Psychiatric: She has a normal mood and affect. Her behavior is normal.  Vitals reviewed.     Assessment & Plan:      Problem List Items Addressed This Visit      Cardiovascular and Mediastinum   Hypertension - Primary    Well controlled May be a bit low for elderly patient and may be contributing to fatigue Continue HCTZ Decrease losartan to 12.5 mg daily Recheck CMP to ensure Cr and K ok after starting ARB F/u in 3 months      Relevant Medications   losartan (COZAAR) 25 MG tablet   Other Relevant Orders   COMPLETE METABOLIC PANEL WITH GFR     Nervous and Auditory   Neuropathic postherpetic trigeminal neuralgia (Location of Primary Source of Pain) (Right) (V1)  (Chronic)    Continues to have pain in R V1 distribution Increase gabapentin to max dose of 3600 mg daily Can consider increasing nortriptyline or switching to Lyrica in the future      Relevant Medications   gabapentin (NEURONTIN) 600 MG tablet   RESOLVED: Postherpetic neuralgia (Chronic)   Relevant Medications   gabapentin (NEURONTIN) 600 MG tablet     Other   Neuropathic pain (Chronic)   Relevant Medications   gabapentin (NEURONTIN) 600 MG tablet   Hypercholesterolemia without hypertriglyceridemia    Recheck lipid panel, CMP      Relevant Medications   losartan (COZAAR) 25 MG tablet   Other Relevant Orders   Lipid panel   COMPLETE METABOLIC PANEL WITH GFR   Microalbuminuria    Diagnosed after last visit when microalbumin was 50 Started ARB and tolerating well Advised on tight  diabetes control (last A1c 6.4, diet controlled)         Return in about 3 months (around 06/26/2017) for HTN, DM.      The entirety of the information documented in the History of Present Illness, Review of Systems and Physical Exam were personally obtained by me. Portions of this information were initially documented by Meyer Cory, CMA and reviewed by me for thoroughness and accuracy.     Lavon Paganini, MD  [facility]

## 2017-03-26 NOTE — Assessment & Plan Note (Signed)
Diagnosed after last visit when microalbumin was 50 Started ARB and tolerating well Advised on tight diabetes control (last A1c 6.4, diet controlled)

## 2017-03-26 NOTE — Assessment & Plan Note (Signed)
Recheck lipid panel, CMP

## 2017-03-27 ENCOUNTER — Telehealth: Payer: Self-pay

## 2017-03-27 MED ORDER — ATORVASTATIN CALCIUM 40 MG PO TABS: 40.0000 mg | ORAL_TABLET | Freq: Every day | ORAL | 3 refills | Status: DC

## 2017-03-27 NOTE — Telephone Encounter (Signed)
Pt advised and agrees to start medication. Sent in.

## 2017-03-27 NOTE — Telephone Encounter (Signed)
-----   Message from Virginia Crews, MD sent at 03/26/2017  4:06 PM EDT ----- Normal kidney function, liver function, electrolytes.  Cholesterol is moderate.  10 year risk of heart disease/stroke is high at 16.5%.  Would recommend starting a cholesterol lowering medication (statin).  Can start Atorvastatin 40mg  daily and Rx #90 r3 if ok with patient.  Virginia Crews, MD, MPH [facility] 03/26/2017 4:06 PM

## 2017-04-27 ENCOUNTER — Ambulatory Visit
Admission: EM | Admit: 2017-04-27 | Discharge: 2017-04-27 | Disposition: A | Discharge status: Home / Self Care | Payer: Medicare Other | Attending: Family Medicine | Admitting: Family Medicine

## 2017-04-27 ENCOUNTER — Encounter: Payer: Self-pay | Admitting: Emergency Medicine

## 2017-04-27 ENCOUNTER — Other Ambulatory Visit: Payer: Self-pay

## 2017-04-27 DIAGNOSIS — Z79899 Other long term (current) drug therapy: Secondary | ICD-10-CM | POA: Diagnosis not present

## 2017-04-27 DIAGNOSIS — J069 Acute upper respiratory infection, unspecified: Secondary | ICD-10-CM

## 2017-04-27 DIAGNOSIS — R05 Cough: Secondary | ICD-10-CM | POA: Insufficient documentation

## 2017-04-27 DIAGNOSIS — J029 Acute pharyngitis, unspecified: Secondary | ICD-10-CM | POA: Diagnosis not present

## 2017-04-27 DIAGNOSIS — M1991 Primary osteoarthritis, unspecified site: Secondary | ICD-10-CM | POA: Diagnosis not present

## 2017-04-27 DIAGNOSIS — B9789 Other viral agents as the cause of diseases classified elsewhere: Secondary | ICD-10-CM

## 2017-04-27 DIAGNOSIS — F329 Major depressive disorder, single episode, unspecified: Secondary | ICD-10-CM | POA: Insufficient documentation

## 2017-04-27 DIAGNOSIS — F419 Anxiety disorder, unspecified: Secondary | ICD-10-CM | POA: Insufficient documentation

## 2017-04-27 DIAGNOSIS — G894 Chronic pain syndrome: Secondary | ICD-10-CM | POA: Insufficient documentation

## 2017-04-27 DIAGNOSIS — E119 Type 2 diabetes mellitus without complications: Secondary | ICD-10-CM | POA: Insufficient documentation

## 2017-04-27 DIAGNOSIS — I10 Essential (primary) hypertension: Secondary | ICD-10-CM | POA: Insufficient documentation

## 2017-04-27 DIAGNOSIS — K219 Gastro-esophageal reflux disease without esophagitis: Secondary | ICD-10-CM | POA: Diagnosis not present

## 2017-04-27 DIAGNOSIS — E669 Obesity, unspecified: Secondary | ICD-10-CM | POA: Diagnosis not present

## 2017-04-27 DIAGNOSIS — J45909 Unspecified asthma, uncomplicated: Secondary | ICD-10-CM | POA: Diagnosis not present

## 2017-04-27 LAB — RAPID STREP SCREEN (MED CTR MEBANE ONLY): STREPTOCOCCUS, GROUP A SCREEN (DIRECT): NEGATIVE

## 2017-04-27 MED ORDER — BENZONATATE 200 MG PO CAPS: 200.0000 mg | ORAL_CAPSULE | Freq: Three times a day (TID) | ORAL | 0 refills | Status: DC | PRN

## 2017-04-27 MED ORDER — HYDROCOD POLST-CPM POLST ER 10-8 MG/5ML PO SUER: 5.0000 mL | Freq: Every evening | ORAL | 0 refills | Status: DC | PRN

## 2017-04-27 NOTE — ED Provider Notes (Signed)
[facility] ____________________________________________  Time seen: Approximately 11:25 AM  I have reviewed the triage vital signs and the nursing notes.   HISTORY  Chief Complaint Cough   HPI DELIANA AVALOS is a 66 y.o. female presenting for evaluation of 3-4 days of runny nose, nasal congestion, mild sore throat and cough.  States cough is her biggest complaint.  States cough frequently wakes her up at night and interrupts sleep.  States cough is a hacking nonproductive cough.  Reports her grandchild recently sick with similar.  Denies associated fevers.  States unresolved with intermittent use of over-the-counter Robitussin.  Reports continues to eat and drink well, continues to remain active.  Denies recent sickness or recent antibiotic use.  Denies other alleviating or aggravating factors.  Denies chest pain, shortness of breath, abdominal pain, or rash.   Virginia Crews, MD: PCP   Past Medical History:  Diagnosis Date  . Anxiety   . Arthritis   . Asthma   . Bronchitis 09/04/2016   urgent care  . Diabetes mellitus without complication ([place])   . GERD (gastroesophageal reflux disease)   . History of shingles   . Hyperlipidemia   . Hypertension   . Neuralgia    Right side of head and face  . Shingles    chronic on Rt side of head and face    Patient Active Problem List   Diagnosis Date Noted  . Microalbuminuria 03/26/2017  . Primary osteoarthritis involving multiple joints 12/05/2016  . S/P total knee arthroplasty 12/01/2016  . Elevated C-reactive protein (CRP) 10/06/2016  . Primary osteoarthritis of left knee 09/07/2016  . Hypertension 07/24/2016  . Breast mass, right 04/16/2016  . Occipital headache 01/21/2016  . Bilateral occipital neuralgia 01/21/2016  . Chronic pain syndrome 05/07/2015  . Gasserian ganglionitis 03/12/2015  . Neuropathic pain 03/12/2015  . Herpes zoster ophthalmicus 03/12/2015  . Anxiety 01/11/2015  . Diabetes ([place])  01/11/2015  . Photopsia 01/11/2015  . Avitaminosis A 01/11/2015  . Depression 01/10/2015  . Obesity (BMI 35.0-39.9 without comorbidity) ([place]) 04/18/2014  . Neuropathic postherpetic trigeminal neuralgia (Location of Primary Source of Pain) (Right) (V1) 12/21/2013  . Cannot sleep 03/13/2009  . Asthma 02/07/2009  . Calcium deficiency disease 10/16/2008  . Hypercholesterolemia without hypertriglyceridemia 07/06/2008  . Avitaminosis D 07/05/2008  . History of colon polyps 10/17/2005    Past Surgical History:  Procedure Laterality Date  . ABDOMINAL HYSTERECTOMY    . BREAST BIOPSY Right 2016   benign  . BREAST BIOPSY Right 04/08/2016   neg- benign  . BREAST LUMPECTOMY Right 03/2016  . CHOLECYSTECTOMY    . TOTAL KNEE ARTHROPLASTY Right 2014  . TOTAL KNEE ARTHROPLASTY Right 02/16/2013     No current facility-administered medications for this encounter.   Current Outpatient Medications:  .  albuterol (PROVENTIL HFA;VENTOLIN HFA) 108 (90 Base) MCG/ACT inhaler, Inhale 2 puffs into the lungs every 4 (four) hours as needed for wheezing or shortness of breath., Disp: 1 Inhaler, Rfl: 0 .  atorvastatin (LIPITOR) 40 MG tablet, Take 1 tablet (40 mg total) by mouth daily., Disp: 90 tablet, Rfl: 3 .  Carboxymethylcellulose Sodium (LUBRICANT EYE DROPS OP), Place 1 drop into both eyes 4 (four) times daily as needed (FOR DRY EYES)., Disp: , Rfl:  .  fluticasone furoate-vilanterol (BREO ELLIPTA) 100-25 MCG/INH AEPB, Inhale 1 puff into the lungs daily., Disp: 28 each, Rfl: 6 .  gabapentin (NEURONTIN) 600 MG tablet, Take 1.5 tablets (900 mg total) by mouth every 6 (six) hours.,  Disp: 540 tablet, Rfl: 3 .  hydrochlorothiazide (HYDRODIURIL) 25 MG tablet, Take 1 tablet (25 mg total) by mouth daily., Disp: 90 tablet, Rfl: 3 .  losartan (COZAAR) 25 MG tablet, Take 0.5 tablets (12.5 mg total) by mouth daily., Disp: 45 tablet, Rfl: 3 .  Multiple Vitamin (MULTIVITAMIN WITH MINERALS) TABS tablet, Take 1 tablet by  mouth daily. WOMEN'S MULTIVITAMIN, Disp: , Rfl:  .  NONFORMULARY OR COMPOUNDED ITEM, Compounded cream: 6% Gabapentin, 2.5% Lidocaine, 0.5% Meloxicam, 5% Methocarbamol, 2.5% Prilocaine. Sig: Apply 1-2 gm(s) (2-4 pumps) to affected area, 3-4 times/day. (1 pump = 0.5 gm) Dispense: 120 gm Pump Bottle. Dispenser: 1 pump = 0.5 gm., Disp: 1 each, Rfl: PRN .  nortriptyline (PAMELOR) 25 MG capsule, Take 1 capsule (25 mg total) by mouth at bedtime., Disp: 90 capsule, Rfl: 0 .  benzonatate (TESSALON) 200 MG capsule, Take 1 capsule (200 mg total) 3 (three) times daily as needed by mouth for cough., Disp: 15 capsule, Rfl: 0 .  chlorpheniramine-HYDROcodone (TUSSIONEX PENNKINETIC ER) 10-8 MG/5ML SUER, Take 5 mLs at bedtime as needed by mouth for cough. do not drive or operate machinery while taking as can cause drowsiness., Disp: 75 mL, Rfl: 0  Allergies Lisinopril; Lovastatin; and Shellfish allergy  Family History  Problem Relation Age of Onset  . Anuerysm Mother        Brain  . Dementia Mother   . Heart failure Father   . CAD Father   . Dementia Father   . Diabetes Father   . Lupus Sister   . Diabetes Brother   . Throat cancer Maternal Grandfather   . Breast cancer Neg Hx     Social History Social History   Tobacco Use  . Smoking status: Never Smoker  . Smokeless tobacco: Never Used  Substance Use Topics  . Alcohol use: No    Alcohol/week: 0.0 oz  . Drug use: No    Review of Systems Constitutional: No fever/chills ENT: Positive sore throat. Cardiovascular: Denies chest pain. Respiratory: Denies shortness of breath. Gastrointestinal: No abdominal pain.  Musculoskeletal: Negative for back pain. Skin: Negative for rash.   ____________________________________________   PHYSICAL EXAM:  VITAL SIGNS: ED Triage Vitals  Enc Vitals Group     BP 04/27/17 1051 105/61     Pulse Rate 04/27/17 1051 98     Resp 04/27/17 1051 16     Temp 04/27/17 1051 98.1 F (36.7 C)     Temp Source  04/27/17 1051 Oral     SpO2 04/27/17 1051 98 %     Weight 04/27/17 1046 218 lb (98.9 kg)     Height 04/27/17 1046 5\' 3"  (1.6 m)     Head Circumference --      Peak Flow --      Pain Score 04/27/17 1046 0     Pain Loc --      Pain Edu? --      Excl. in [place]? --     Constitutional: Alert and oriented. Well appearing and in no acute distress. Eyes: Conjunctivae are normal.  Head: Atraumatic. No sinus tenderness to palpation. No swelling. No erythema.  Ears: no erythema, normal TMs bilaterally.   Nose:Nasal congestion   Mouth/Throat: Mucous membranes are moist. Mild pharyngeal erythema. No tonsillar swelling or exudate.  Neck: No stridor.  No cervical spine tenderness to palpation. Hematological/Lymphatic/Immunilogical: No cervical lymphadenopathy. Cardiovascular: Normal rate, regular rhythm. Grossly normal heart sounds.  Good peripheral circulation. Respiratory: Normal respiratory effort.  No retractions. No wheezes,  rales or rhonchi. Good air movement.  Gastrointestinal: Soft and nontender.  Musculoskeletal: Ambulatory with steady gait. No cervical, thoracic or lumbar tenderness to palpation. Neurologic:  Normal speech and language. No gait instability. Skin:  Skin appears warm, dry. Psychiatric: Mood and affect are normal. Speech and behavior are normal.  ___________________________________________   LABS (all labs ordered are listed, but only abnormal results are displayed)  Labs Reviewed  RAPID STREP SCREEN (NOT AT [facility])     PROCEDURES Procedures    INITIAL IMPRESSION / [place] / ED COURSE  Pertinent labs & imaging results that were available during my care of the patient were reviewed by me and considered in my medical decision making (see chart for details).  Well-appearing patient.  No acute distress.  3-4 days of runny nose, nasal congestion and cough symptoms.  Suspect viral upper respiratory infection.  Quick strep negative, will culture.  Will treat  supportively with as needed Tessalon Perles and as needed Tussionex at night.  Discussed strict follow-up and return parameters.Discussed indication, risks and benefits of medications with patient.  Discussed follow up with Primary care physician this week. Discussed follow up and return parameters including no resolution or any worsening concerns. Patient verbalized understanding and agreed to plan.   ____________________________________________   FINAL CLINICAL IMPRESSION(S) / ED DIAGNOSES  Final diagnoses:  Viral URI with cough     ED Discharge Orders        Ordered    chlorpheniramine-HYDROcodone (TUSSIONEX PENNKINETIC ER) 10-8 MG/5ML SUER  At bedtime PRN     04/27/17 1114    benzonatate (TESSALON) 200 MG capsule  3 times daily PRN     04/27/17 1114       Note: This dictation was prepared with Dragon dictation along with smaller phrase technology. Any transcriptional errors that result from this process are unintentional.         Marylene Land, NP 04/27/17 1129

## 2017-04-27 NOTE — Discharge Instructions (Signed)
Take medication as prescribed. Rest. Drink plenty of fluids.  ° °Follow up with your primary care physician this week as needed. Return to Urgent care for new or worsening concerns.  ° °

## 2017-04-27 NOTE — ED Triage Notes (Signed)
Patient c/o cough and chest congestion for 5 days.  Patient denies fevers.  

## 2017-04-30 ENCOUNTER — Encounter: Payer: Medicare Other | Admitting: Nurse Practitioner

## 2017-04-30 LAB — CULTURE, GROUP A STREP (THRC)

## 2017-05-05 ENCOUNTER — Ambulatory Visit: Payer: Medicare Other | Attending: Nurse Practitioner | Admitting: Nurse Practitioner

## 2017-05-05 ENCOUNTER — Encounter: Payer: Self-pay | Admitting: Nurse Practitioner

## 2017-05-05 ENCOUNTER — Other Ambulatory Visit: Payer: Self-pay

## 2017-05-05 VITALS — BP 133/89 | HR 107 | Temp 97.6°F | Resp 16 | Ht 63.0 in | Wt 218.0 lb

## 2017-05-05 DIAGNOSIS — B0229 Other postherpetic nervous system involvement: Secondary | ICD-10-CM | POA: Diagnosis not present

## 2017-05-05 DIAGNOSIS — M1712 Unilateral primary osteoarthritis, left knee: Secondary | ICD-10-CM | POA: Diagnosis not present

## 2017-05-05 DIAGNOSIS — Z7984 Long term (current) use of oral hypoglycemic drugs: Secondary | ICD-10-CM | POA: Diagnosis not present

## 2017-05-05 DIAGNOSIS — K219 Gastro-esophageal reflux disease without esophagitis: Secondary | ICD-10-CM | POA: Insufficient documentation

## 2017-05-05 DIAGNOSIS — B0222 Postherpetic trigeminal neuralgia: Secondary | ICD-10-CM | POA: Diagnosis not present

## 2017-05-05 DIAGNOSIS — E559 Vitamin D deficiency, unspecified: Secondary | ICD-10-CM | POA: Insufficient documentation

## 2017-05-05 DIAGNOSIS — E785 Hyperlipidemia, unspecified: Secondary | ICD-10-CM | POA: Insufficient documentation

## 2017-05-05 DIAGNOSIS — F329 Major depressive disorder, single episode, unspecified: Secondary | ICD-10-CM | POA: Insufficient documentation

## 2017-05-05 DIAGNOSIS — Z86010 Personal history of colonic polyps: Secondary | ICD-10-CM | POA: Insufficient documentation

## 2017-05-05 DIAGNOSIS — Z9049 Acquired absence of other specified parts of digestive tract: Secondary | ICD-10-CM | POA: Diagnosis not present

## 2017-05-05 DIAGNOSIS — E119 Type 2 diabetes mellitus without complications: Secondary | ICD-10-CM | POA: Insufficient documentation

## 2017-05-05 DIAGNOSIS — Z6839 Body mass index (BMI) 39.0-39.9, adult: Secondary | ICD-10-CM | POA: Insufficient documentation

## 2017-05-05 DIAGNOSIS — E7800 Pure hypercholesterolemia, unspecified: Secondary | ICD-10-CM | POA: Diagnosis not present

## 2017-05-05 DIAGNOSIS — E509 Vitamin A deficiency, unspecified: Secondary | ICD-10-CM | POA: Diagnosis not present

## 2017-05-05 DIAGNOSIS — M792 Neuralgia and neuritis, unspecified: Secondary | ICD-10-CM

## 2017-05-05 DIAGNOSIS — Z96653 Presence of artificial knee joint, bilateral: Secondary | ICD-10-CM | POA: Diagnosis not present

## 2017-05-05 DIAGNOSIS — R51 Headache: Secondary | ICD-10-CM | POA: Diagnosis not present

## 2017-05-05 DIAGNOSIS — I10 Essential (primary) hypertension: Secondary | ICD-10-CM | POA: Insufficient documentation

## 2017-05-05 DIAGNOSIS — R519 Headache, unspecified: Secondary | ICD-10-CM

## 2017-05-05 DIAGNOSIS — J45909 Unspecified asthma, uncomplicated: Secondary | ICD-10-CM | POA: Insufficient documentation

## 2017-05-05 DIAGNOSIS — E669 Obesity, unspecified: Secondary | ICD-10-CM | POA: Insufficient documentation

## 2017-05-05 DIAGNOSIS — G894 Chronic pain syndrome: Secondary | ICD-10-CM | POA: Diagnosis not present

## 2017-05-05 DIAGNOSIS — Z79899 Other long term (current) drug therapy: Secondary | ICD-10-CM | POA: Insufficient documentation

## 2017-05-05 DIAGNOSIS — F419 Anxiety disorder, unspecified: Secondary | ICD-10-CM | POA: Insufficient documentation

## 2017-05-05 MED ORDER — GABAPENTIN 600 MG PO TABS: 900.0000 mg | ORAL_TABLET | Freq: Four times a day (QID) | ORAL | 3 refills | Status: DC

## 2017-05-05 NOTE — Progress Notes (Signed)
Patient's Name: [name]  MRN: [id number]  Referring Provider: Margo Common, [place]  DOB: April 20, 1951  PCP: Virginia Crews, MD  DOS: 05/05/2017  Note by: Vevelyn Francois NP  Service setting: Ambulatory outpatient  Specialty: Interventional Pain Management  Location: [facility] (AMB) Pain Management Facility    Patient type: Established    Primary Reason(s) for Visit: Encounter for prescription drug management. (Level of risk: moderate)  CC: Peripheral Neuropathy (right eye)  HPI  [name] is a 66 y.o. year old, female patient, who comes today for a medication management evaluation. She has Depression; Obesity (BMI 35.0-39.9 without comorbidity) ([place]); Neuropathic postherpetic trigeminal neuralgia (Location of Primary Source of Pain) (Right) (V1); Anxiety; Asthma; Diabetes ([place]); Photopsia; History of colon polyps; Calcium deficiency disease; Cannot sleep; Hypercholesterolemia without hypertriglyceridemia; Avitaminosis A; Avitaminosis D; Gasserian ganglionitis; Neuropathic pain; Chronic pain syndrome; Occipital headache; Bilateral occipital neuralgia; Breast mass, right; Herpes zoster ophthalmicus; Hypertension; Primary osteoarthritis of left knee; Elevated C-reactive protein (CRP); S/P total knee arthroplasty; Primary osteoarthritis involving multiple joints; and Microalbuminuria on their problem list. Her primarily concern today is the Peripheral Neuropathy (right eye)  Pain Assessment: Location: Right Eye Radiating: temporal to back of head Onset: Other (comment) Duration: Chronic pain Quality: Aching, Constant, Discomfort, Pins and needles Severity: 8 /10 (self-reported pain score)  Note: Reported level is compatible with observation.                          Effect on ADL: na Timing: Rarely Modifying factors: gabapentin  [name] was last scheduled for an appointment on Visit date not found for medication management. During today's appointment we reviewed [name]'s chronic  pain status, as well as her outpatient medication regimen. She admits that her pain is stable as long as she takes her gabapentin. She admits that it has been 7 years since been diagnosed with shingles. She states that this was related to stressful events in her life.  The patient  reports that she does not use drugs. Her body mass index is 38.62 kg/m.  Further details on both, my assessment(s), as well as the proposed treatment plan, please see below.  Controlled Substance Pharmacotherapy Assessment REMS (Risk Evaluation and Mitigation Strategy)  Analgesic:None MME/day: 0 mg/day.  Ignatius Specking, RN  05/05/2017  9:48 AM  Sign at close encounter Safety precautions to be maintained throughout the outpatient stay will include: orient to surroundings, keep bed in low position, maintain call bell within reach at all times, provide assistance with transfer out of bed and ambulation.    Pharmacokinetics: Liberation and absorption (onset of action): WNL Distribution (time to peak effect): WNL Metabolism and excretion (duration of action): WNL         Pharmacodynamics: Desired effects: Analgesia: [name] reports >50% benefit. Functional ability: Patient reports that medication allows her to accomplish basic ADLs Clinically meaningful improvement in function (CMIF): Sustained CMIF goals met Perceived effectiveness: Described as relatively effective, allowing for increase in activities of daily living (ADL) Undesirable effects: Side-effects or Adverse reactions: None reported Monitoring: [place] PMP: Online review of the past 3-monthperiod conducted. Compliant with practice rules and regulations Last UDS on record: Summary  Date Value Ref Range Status  01/21/2016 FINAL  Final    Comment:    ==================================================================== [facility]RE COMP DRUG ANALYSIS,UR ==================================================================== Test  Result       Flag       Units Drug Present and Declared for Prescription Verification   Gabapentin                     PRESENT      EXPECTED   Trazodone                      PRESENT      EXPECTED   1,3 chlorophenyl piperazine    PRESENT      EXPECTED    1,3-chlorophenyl piperazine is an expected metabolite of    trazodone. Drug Present not Declared for Prescription Verification   Acetaminophen                  PRESENT      UNEXPECTED   Naproxen                       PRESENT      UNEXPECTED   Diphenhydramine                PRESENT      UNEXPECTED Drug Absent but Declared for Prescription Verification   Salicylate                     Not Detected UNEXPECTED    Aspirin, as indicated in the declared medication list, is not    always detected even when used as directed. ==================================================================== Test                      Result    Flag   Units      Ref Range   Creatinine              66               mg/dL      >=20 ==================================================================== Declared Medications:  The flagging and interpretation on this report are based on the  following declared medications.  Unexpected results may arise from  inaccuracies in the declared medications.  **Note: The testing scope of this panel includes these medications:  Gabapentin (Neurontin)  Trazodone  **Note: The testing scope of this panel does not include small to  moderate amounts of these reported medications:  Aspirin (Aspirin 81)  **Note: The testing scope of this panel does not include following  reported medications:  Albuterol  Amlodipine (Norvasc)  Fluticasone (Breo)  Hydrochlorothiazide (Microzide)  Lansoprazole (Prevacid)  Metformin  Olopatadine  Pravastatin  Vilanterol (Breo) ==================================================================== For clinical consultation, please call [phone]. ====================================================================    UDS interpretation: Compliant          Medication Assessment Form: Reviewed. Patient indicates being compliant with therapy Treatment compliance: Compliant Risk Assessment Profile: Aberrant behavior: See prior evaluations. None observed or detected today Comorbid factors increasing risk of overdose: See prior notes. No additional risks detected today Risk of substance use disorder (SUD): Low Opioid Risk Tool - 05/05/17 0943      Family History of Substance Abuse   Alcohol  Negative    Illegal Drugs  Negative    Rx Drugs  Negative      Personal History of Substance Abuse   Alcohol  Negative    Illegal Drugs  Negative    Rx Drugs  Negative      Age   Age between 67-45 years   No  History of Preadolescent Sexual Abuse   History of Preadolescent Sexual Abuse  Negative or Female      Psychological Disease   Psychological Disease  Negative    Depression  Negative      Total Score   Opioid Risk Tool Scoring  0    Opioid Risk Interpretation  Low Risk      ORT Scoring interpretation table:  Score <3 = Low Risk for SUD  Score between 4-7 = Moderate Risk for SUD  Score >8 = High Risk for Opioid Abuse   Risk Mitigation Strategies:  Patient Counseling: Covered Patient-Prescriber Agreement (PPA): Present and active  Notification to other healthcare providers: Done  Pharmacologic Plan: No change in therapy, at this time  Laboratory Chemistry  Inflammation Markers (CRP: Acute Phase) (ESR: Chronic Phase) Lab Results  Component Value Date   CRP 1.7 (H) 11/18/2016   ESRSEDRATE 18 11/18/2016                 Renal Function Markers Lab Results  Component Value Date   BUN 13 03/26/2017   CREATININE 0.96 03/26/2017   GFRAA 70 03/26/2017   GFRNONAA 61 03/26/2017                 Hepatic Function Markers Lab Results  Component Value Date   AST 11 03/26/2017   ALT 7 03/26/2017   ALBUMIN  3.9 01/15/2017   ALKPHOS 82 01/15/2017                 Electrolytes Lab Results  Component Value Date   NA 138 03/26/2017   K 4.0 03/26/2017   CL 99 03/26/2017   CALCIUM 9.3 03/26/2017   MG 2.0 07/25/2016                 Neuropathy Markers Lab Results  Component Value Date   VITAMINB12 375 07/25/2016                 Bone Pathology Markers Lab Results  Component Value Date   ALKPHOS 82 01/15/2017   25OHVITD1 32 07/25/2016   25OHVITD2 5.1 07/25/2016   25OHVITD3 27 07/25/2016   CALCIUM 9.3 03/26/2017                 Rheumatology Markers No results found for: LABURIC, URICUR              Coagulation Parameters Lab Results  Component Value Date   INR 0.98 11/18/2016   LABPROT 13.0 11/18/2016   APTT 27 11/18/2016   PLT 356 01/15/2017                 Cardiovascular Markers Lab Results  Component Value Date   CKTOTAL 128 03/28/2016   CKMB 1.3 10/11/2013   TROPONINI < 0.02 10/11/2013   HGB 12.9 01/15/2017   HCT 41.2 01/15/2017                 CA Markers No results found for: CEA, CA125, LABCA2               Note: Lab results reviewed.  Recent Diagnostic Imaging Results  DG Knee Left Port CLINICAL DATA:  Total knee arthroplasty  EXAM: PORTABLE LEFT KNEE - 1-2 VIEW  COMPARISON:  None.  FINDINGS: Total knee arthroplasty is in place. Anatomic alignment. No breakage or loosening of the hardware. Surgical drains in place.  IMPRESSION: Total knee arthroplasty anatomically aligned.  Electronically Signed   By: Marybelle Killings M.D.   On:  12/01/2016 12:16  Complexity Note: Imaging results reviewed. Results shared with Ms. Heavin, using Layman's terms.                         Meds   Current Outpatient Medications:  .  albuterol (PROVENTIL HFA;VENTOLIN HFA) 108 (90 Base) MCG/ACT inhaler, Inhale 2 puffs into the lungs every 4 (four) hours as needed for wheezing or shortness of breath., Disp: 1 Inhaler, Rfl: 0 .  atorvastatin (LIPITOR) 40 MG tablet, Take 1  tablet (40 mg total) by mouth daily., Disp: 90 tablet, Rfl: 3 .  benzonatate (TESSALON) 200 MG capsule, Take 1 capsule (200 mg total) 3 (three) times daily as needed by mouth for cough., Disp: 15 capsule, Rfl: 0 .  Carboxymethylcellulose Sodium (LUBRICANT EYE DROPS OP), Place 1 drop into both eyes 4 (four) times daily as needed (FOR DRY EYES)., Disp: , Rfl:  .  chlorpheniramine-HYDROcodone (TUSSIONEX PENNKINETIC ER) 10-8 MG/5ML SUER, Take 5 mLs at bedtime as needed by mouth for cough. do not drive or operate machinery while taking as can cause drowsiness., Disp: 75 mL, Rfl: 0 .  fluticasone furoate-vilanterol (BREO ELLIPTA) 100-25 MCG/INH AEPB, Inhale 1 puff into the lungs daily., Disp: 28 each, Rfl: 6 .  gabapentin (NEURONTIN) 600 MG tablet, Take 1.5 tablets (900 mg total) by mouth every 6 (six) hours., Disp: 540 tablet, Rfl: 3 .  hydrochlorothiazide (HYDRODIURIL) 25 MG tablet, Take 1 tablet (25 mg total) by mouth daily., Disp: 90 tablet, Rfl: 3 .  losartan (COZAAR) 25 MG tablet, Take 0.5 tablets (12.5 mg total) by mouth daily., Disp: 45 tablet, Rfl: 3 .  Multiple Vitamin (MULTIVITAMIN WITH MINERALS) TABS tablet, Take 1 tablet by mouth daily. WOMEN'S MULTIVITAMIN, Disp: , Rfl:  .  nortriptyline (PAMELOR) 25 MG capsule, Take 1 capsule (25 mg total) by mouth at bedtime., Disp: 90 capsule, Rfl: 0  ROS  Constitutional: Denies any fever or chills Gastrointestinal: No reported hemesis, hematochezia, vomiting, or acute GI distress Musculoskeletal: Denies any acute onset joint swelling, redness, loss of ROM, or weakness Neurological: No reported episodes of acute onset apraxia, aphasia, dysarthria, agnosia, amnesia, paralysis, loss of coordination, or loss of consciousness  Allergies  Ms. Pettis is allergic to lisinopril; lovastatin; and shellfish allergy.  PFSH  Drug: Ms. Popescu  reports that she does not use drugs. Alcohol:  reports that she does not drink alcohol. Tobacco:  reports that  has never  smoked. she has never used smokeless tobacco. Medical:  has a past medical history of Anxiety, Arthritis, Asthma, Bronchitis (09/04/2016), Diabetes mellitus without complication ([place]), GERD (gastroesophageal reflux disease), History of shingles, Hyperlipidemia, Hypertension, Neuralgia, and Shingles. Surgical: Ms. Casad  has a past surgical history that includes Total knee arthroplasty (Right, 2014); Abdominal hysterectomy; Total knee arthroplasty (Right, 02/16/2013); Cholecystectomy; Breast lumpectomy with needle localization (Right, 05/02/2016); Breast biopsy (Right, 2016); Breast biopsy (Right, 04/08/2016); Breast lumpectomy (Right, 03/2016); and Knee Arthroplasty (Left, 12/01/2016). Family: family history includes Anuerysm in her mother; CAD in her father; Dementia in her father and mother; Diabetes in her brother and father; Heart failure in her father; Lupus in her sister; Throat cancer in her maternal grandfather.  Constitutional Exam  General appearance: Well nourished, well developed, and well hydrated. In no apparent acute distress Vitals:   05/05/17 0936  BP: 133/89  Pulse: (!) 107  Resp: 16  Temp: 97.6 F (36.4 C)  SpO2: 100%  Weight: 218 lb (98.9 kg)  Height: 5' 3" (1.6 m)  Psych/Mental status: Alert, oriented x 3 (person, place, & time)       Eyes: PERLA Respiratory: No evidence of acute respiratory distress  Cervical Spine Area Exam  Skin & Axial Inspection: No masses, redness, edema, swelling, or associated skin lesions Alignment: Symmetrical Functional ROM: Unrestricted ROM      Stability: No instability detected Muscle Tone/Strength: Functionally intact. No obvious neuro-muscular anomalies detected. Sensory (Neurological): Unimpaired Palpation: No palpable anomalies              Gait & Posture Assessment  Ambulation: Unassisted Gait: Relatively normal for age and body habitus Posture: WNL   Assessment  Primary Diagnosis & Pertinent Problem List: The primary  encounter diagnosis was Neuropathic postherpetic trigeminal neuralgia (Location of Primary Source of Pain) (Right) (V1). Diagnoses of Occipital headache, Primary osteoarthritis of left knee, Neuropathic pain, Postherpetic neuralgia, and Chronic pain syndrome were also pertinent to this visit.  Status Diagnosis  Controlled Controlled Controlled 1. Neuropathic postherpetic trigeminal neuralgia (Location of Primary Source of Pain) (Right) (V1)   2. Occipital headache   3. Primary osteoarthritis of left knee   4. Neuropathic pain   5. Postherpetic neuralgia   6. Chronic pain syndrome     Problems updated and reviewed during this visit: No problems updated. Plan of Care  Pharmacotherapy (Medications Ordered): Meds ordered this encounter  Medications  . gabapentin (NEURONTIN) 600 MG tablet    Sig: Take 1.5 tablets (900 mg total) by mouth every 6 (six) hours.    Dispense:  540 tablet    Refill:  3    Do not place medication on "Automatic Refill". Fill one day early if pharmacy is closed on scheduled refill date.    Order Specific Question:   Supervising Provider    Answer:   Milinda Pointer [[id number]]  This SmartLink is deprecated. Use AVSMEDLIST instead to display the medication list for a patient. Medications administered today: Etheleen Mayhew. Cassis had no medications administered during this visit. Lab-work, procedure(s), and/or referral(s): No orders of the defined types were placed in this encounter.  Imaging and/or referral(s): None  Interventional therapies: Planned, scheduled, and/or pending:   Not at this time.   Considering:   DiagnosticRight Supraorbital & SupratrochlearNerve Block (Trigeminal Nerve Branches) Possible right supraorbital and supratrochlear RFA Diagnostic Right Trigeminal Nerve (V1) Block, under fluoro and IV sedation.   Palliative PRN treatment(s):   DiagnosticRight Supraorbital & SupratrochlearNerve Block (Trigeminal Nerve Branches)    Provider-requested follow-up: Return in about 6 months (around 11/02/2017) for MedMgmt.  Future Appointments  Date Time Provider [place]  05/25/2017 10:20 AM [facility]  05/25/2017 10:40 AM [facility] [place] [facility]  05/25/2017 10:50 AM [facility] [place] [facility]  05/27/2017  9:15 AM Clayburn Pert, MD [facility] None  06/26/2017  8:30 AM Brita Romp, Dionne Bucy, MD [facility] None  10/28/2017  9:30 AM Vevelyn Francois, NP [facility] None   Primary Care Physician: Virginia Crews, MD Location: [facility] Note by: Vevelyn Francois NP Date: 05/05/2017; Time: 3:53 PM  Pain Score Disclaimer: We use the NRS-11 scale. This is a self-reported, subjective measurement of pain severity with only modest accuracy. It is used primarily to identify changes within a particular patient. It must be understood that outpatient pain scales are significantly less accurate that those used for research, where they can be applied under ideal controlled circumstances with minimal exposure to variables. In reality, the score is likely to be a combination of pain  intensity and pain affect, where pain affect describes the degree of emotional arousal or changes in action readiness caused by the sensory experience of pain. Factors such as social and work situation, setting, emotional state, anxiety levels, expectation, and prior pain experience may influence pain perception and show large inter-individual differences that may also be affected by time variables.  Patient instructions provided during this appointment: Patient Instructions    ____________________________________________________________________________________________  Medication Rules  Applies to: All patients receiving prescriptions (written or electronic).  Pharmacy of record: Pharmacy where electronic prescriptions will be sent. If written prescriptions are taken to a different pharmacy, please  inform the nursing staff. The pharmacy listed in the electronic medical record should be the one where you would like electronic prescriptions to be sent.  Prescription refills: Only during scheduled appointments. Applies to both, written and electronic prescriptions.  NOTE: The following applies primarily to controlled substances (Opioid* Pain Medications).   Patient's responsibilities: 1. Pain Pills: Bring all pain pills to every appointment (except for procedure appointments). 2. Pill Bottles: Bring pills in original pharmacy bottle. Always bring newest bottle. Bring bottle, even if empty. 3. Medication refills: You are responsible for knowing and keeping track of what medications you need refilled. The day before your appointment, write a list of all prescriptions that need to be refilled. Bring that list to your appointment and give it to the admitting nurse. Prescriptions will be written only during appointments. If you forget a medication, it will not be "Called in", "Faxed", or "electronically sent". You will need to get another appointment to get these prescribed. 4. Prescription Accuracy: You are responsible for carefully inspecting your prescriptions before leaving our office. Have the discharge nurse carefully go over each prescription with you, before taking them home. Make sure that your name is accurately spelled, that your address is correct. Check the name and dose of your medication to make sure it is accurate. Check the number of pills, and the written instructions to make sure they are clear and accurate. Make sure that you are given enough medication to last until your next medication refill appointment. 5. Taking Medication: Take medication as prescribed. Never take more pills than instructed. Never take medication more frequently than prescribed. Taking less pills or less frequently is permitted and encouraged, when it comes to controlled substances (written prescriptions).   6. Inform other Doctors: Always inform, all of your healthcare providers, of all the medications you take. 7. Pain Medication from other Providers: You are not allowed to accept any additional pain medication from any other Doctor or Healthcare provider. There are two exceptions to this rule. (see below) In the event that you require additional pain medication, you are responsible for notifying us, as stated below. 8. Medication Agreement: You are responsible for carefully reading and following our Medication Agreement. This must be signed before receiving any prescriptions from our practice. Safely store a copy of your signed Agreement. Violations to the Agreement will result in no further prescriptions. (Additional copies of our Medication Agreement are available upon request.) 9. Laws, Rules, & Regulations: All patients are expected to follow all Federal and [employer], Rules, [employer]. Ignorance of the Laws does not constitute a valid excuse. The use of any illegal substances is prohibited. 10. Adopted CDC guidelines & recommendations: Target dosing levels will be at or below 60 MME/day. Use of benzodiazepines** is not recommended.  Exceptions: There are only two exceptions to the rule of not receiving pain medications from other  Healthcare Providers. 1. Exception #1 (Emergencies): In the event of an emergency (i.e.: accident requiring emergency care), you are allowed to receive additional pain medication. However, you are responsible for: As soon as you are able, call our office [phone], at any time of the day or night, and leave a message stating your name, the date and nature of the emergency, and the name and dose of the medication prescribed. In the event that your call is answered by a member of our staff, make sure to document and save the date, time, and the name of the person that took your information.  2. Exception #2 (Planned Surgery): In the event that you are  scheduled by another doctor or dentist to have any type of surgery or procedure, you are allowed (for a period no longer than 30 days), to receive additional pain medication, for the acute post-op pain. However, in this case, you are responsible for picking up a copy of our "Post-op Pain Management for Surgeons" handout, and giving it to your surgeon or dentist. This document is available at our office, and does not require an appointment to obtain it. Simply go to our office during business hours (Monday-Thursday from 8:00 AM to 4:00 PM) (Friday 8:00 AM to 12:00 Noon) or if you have a scheduled appointment with [place], prior to your surgery, and ask for it by name. In addition, you will need to provide [place] with your name, name of your surgeon, type of surgery, and date of procedure or surgery.  *Opioid medications include: morphine, codeine, oxycodone, oxymorphone, hydrocodone, hydromorphone, meperidine, tramadol, tapentadol, buprenorphine, fentanyl, methadone. **Benzodiazepine medications include: diazepam (Valium), alprazolam (Xanax), clonazepam (Klonopine), lorazepam (Ativan), clorazepate (Tranxene), chlordiazepoxide (Librium), estazolam (Prosom), oxazepam (Serax), temazepam (Restoril), triazolam (Halcion)  ____________________________________________________________________________________________   BMI Assessment: Estimated body mass index is 38.62 kg/m as calculated from the following:   Height as of this encounter: 5' 3" (1.6 m).   Weight as of this encounter: 218 lb (98.9 kg).  BMI interpretation table: BMI level Category Range association with higher incidence of chronic pain  <18 kg/m2 Underweight   18.5-24.9 kg/m2 Ideal body weight   25-29.9 kg/m2 Overweight Increased incidence by 20%  30-34.9 kg/m2 Obese (Class I) Increased incidence by 68%  35-39.9 kg/m2 Severe obesity (Class II) Increased incidence by 136%  >40 kg/m2 Extreme obesity (Class III) Increased incidence by 254%   BMI  Readings from Last 4 Encounters:  05/05/17 38.62 kg/m  04/27/17 38.62 kg/m  03/26/17 38.26 kg/m  02/24/17 38.44 kg/m   Wt Readings from Last 4 Encounters:  05/05/17 218 lb (98.9 kg)  04/27/17 218 lb (98.9 kg)  03/26/17 216 lb (98 kg)  02/24/17 217 lb (98.4 kg)

## 2017-05-05 NOTE — Patient Instructions (Signed)
____________________________________________________________________________________________  Medication Rules  Applies to: All patients receiving prescriptions (written or electronic).  Pharmacy of record: Pharmacy where electronic prescriptions will be sent. If written prescriptions are taken to a different pharmacy, please inform the nursing staff. The pharmacy listed in the electronic medical record should be the one where you would like electronic prescriptions to be sent.  Prescription refills: Only during scheduled appointments. Applies to both, written and electronic prescriptions.  NOTE: The following applies primarily to controlled substances (Opioid* Pain Medications).   Patient's responsibilities: 1. Pain Pills: Bring all pain pills to every appointment (except for procedure appointments). 2. Pill Bottles: Bring pills in original pharmacy bottle. Always bring newest bottle. Bring bottle, even if empty. 3. Medication refills: You are responsible for knowing and keeping track of what medications you need refilled. The day before your appointment, write a list of all prescriptions that need to be refilled. Bring that list to your appointment and give it to the admitting nurse. Prescriptions will be written only during appointments. If you forget a medication, it will not be "Called in", "Faxed", or "electronically sent". You will need to get another appointment to get these prescribed. 4. Prescription Accuracy: You are responsible for carefully inspecting your prescriptions before leaving our office. Have the discharge nurse carefully go over each prescription with you, before taking them home. Make sure that your name is accurately spelled, that your address is correct. Check the name and dose of your medication to make sure it is accurate. Check the number of pills, and the written instructions to make sure they are clear and accurate. Make sure that you are given enough medication to  last until your next medication refill appointment. 5. Taking Medication: Take medication as prescribed. Never take more pills than instructed. Never take medication more frequently than prescribed. Taking less pills or less frequently is permitted and encouraged, when it comes to controlled substances (written prescriptions).  6. Inform other Doctors: Always inform, all of your healthcare providers, of all the medications you take. 7. Pain Medication from other Providers: You are not allowed to accept any additional pain medication from any other Doctor or Healthcare provider. There are two exceptions to this rule. (see below) In the event that you require additional pain medication, you are responsible for notifying us, as stated below. 8. Medication Agreement: You are responsible for carefully reading and following our Medication Agreement. This must be signed before receiving any prescriptions from our practice. Safely store a copy of your signed Agreement. Violations to the Agreement will result in no further prescriptions. (Additional copies of our Medication Agreement are available upon request.) 9. Laws, Rules, & Regulations: All patients are expected to follow all Federal and State Laws, Statutes, Rules, & Regulations. Ignorance of the Laws does not constitute a valid excuse. The use of any illegal substances is prohibited. 10. Adopted CDC guidelines & recommendations: Target dosing levels will be at or below 60 MME/day. Use of benzodiazepines** is not recommended.  Exceptions: There are only two exceptions to the rule of not receiving pain medications from other Healthcare Providers. 1. Exception #1 (Emergencies): In the event of an emergency (i.e.: accident requiring emergency care), you are allowed to receive additional pain medication. However, you are responsible for: As soon as you are able, call our office [phone], at any time of the day or night, and leave a message stating your  name, the date and nature of the emergency, and the name and dose of the medication   prescribed. In the event that your call is answered by a member of our staff, make sure to document and save the date, time, and the name of the person that took your information.  2. Exception #2 (Planned Surgery): In the event that you are scheduled by another doctor or dentist to have any type of surgery or procedure, you are allowed (for a period no longer than 30 days), to receive additional pain medication, for the acute post-op pain. However, in this case, you are responsible for picking up a copy of our "Post-op Pain Management for Surgeons" handout, and giving it to your surgeon or dentist. This document is available at our office, and does not require an appointment to obtain it. Simply go to our office during business hours (Monday-Thursday from 8:00 AM to 4:00 PM) (Friday 8:00 AM to 12:00 Noon) or if you have a scheduled appointment with [place], prior to your surgery, and ask for it by name. In addition, you will need to provide [place] with your name, name of your surgeon, type of surgery, and date of procedure or surgery.  *Opioid medications include: morphine, codeine, oxycodone, oxymorphone, hydrocodone, hydromorphone, meperidine, tramadol, tapentadol, buprenorphine, fentanyl, methadone. **Benzodiazepine medications include: diazepam (Valium), alprazolam (Xanax), clonazepam (Klonopine), lorazepam (Ativan), clorazepate (Tranxene), chlordiazepoxide (Librium), estazolam (Prosom), oxazepam (Serax), temazepam (Restoril), triazolam (Halcion)  ____________________________________________________________________________________________   BMI Assessment: Estimated body mass index is 38.62 kg/m as calculated from the following:   Height as of this encounter: 5\' 3"  (1.6 m).   Weight as of this encounter: 218 lb (98.9 kg).  BMI interpretation table: BMI level Category Range association with higher incidence of chronic pain   <18 kg/m2 Underweight   18.5-24.9 kg/m2 Ideal body weight   25-29.9 kg/m2 Overweight Increased incidence by 20%  30-34.9 kg/m2 Obese (Class I) Increased incidence by 68%  35-39.9 kg/m2 Severe obesity (Class II) Increased incidence by 136%  >40 kg/m2 Extreme obesity (Class III) Increased incidence by 254%   BMI Readings from Last 4 Encounters:  05/05/17 38.62 kg/m  04/27/17 38.62 kg/m  03/26/17 38.26 kg/m  02/24/17 38.44 kg/m   Wt Readings from Last 4 Encounters:  05/05/17 218 lb (98.9 kg)  04/27/17 218 lb (98.9 kg)  03/26/17 216 lb (98 kg)  02/24/17 217 lb (98.4 kg)

## 2017-05-05 NOTE — Progress Notes (Signed)
Safety precautions to be maintained throughout the outpatient stay will include: orient to surroundings, keep bed in low position, maintain call bell within reach at all times, provide assistance with transfer out of bed and ambulation.  

## 2017-05-25 ENCOUNTER — Other Ambulatory Visit: Payer: Medicare Other

## 2017-05-26 ENCOUNTER — Telehealth: Payer: Self-pay

## 2017-05-26 ENCOUNTER — Other Ambulatory Visit: Payer: Self-pay

## 2017-05-26 DIAGNOSIS — D241 Benign neoplasm of right breast: Secondary | ICD-10-CM

## 2017-05-26 DIAGNOSIS — R921 Mammographic calcification found on diagnostic imaging of breast: Secondary | ICD-10-CM

## 2017-05-26 DIAGNOSIS — Z1231 Encounter for screening mammogram for malignant neoplasm of breast: Secondary | ICD-10-CM

## 2017-05-26 DIAGNOSIS — Z9889 Other specified postprocedural states: Secondary | ICD-10-CM

## 2017-05-26 NOTE — Telephone Encounter (Signed)
Spoke with patient regarding Mammogram appointment 10/22/17 @ 9:20 am [facility] per [facility] [place]   I let patient know that when the template is available I will call and schedule a follow up with Dr.Woodham to discuss mammogram and breast exam.

## 2017-05-27 ENCOUNTER — Ambulatory Visit: Payer: Self-pay | Admitting: General Surgery

## 2017-06-01 ENCOUNTER — Telehealth: Payer: Self-pay

## 2017-06-01 NOTE — Telephone Encounter (Signed)
Patient notified of appointments in May 2019.  Mammogram @ 9:20 10/22/16 Dr.Woodham-10/26/16 @ 9 am Appointment reminders mailed today.

## 2017-06-26 ENCOUNTER — Ambulatory Visit (INDEPENDENT_AMBULATORY_CARE_PROVIDER_SITE_OTHER): Payer: Medicare Other | Admitting: Family Medicine

## 2017-06-26 ENCOUNTER — Encounter: Payer: Self-pay | Admitting: Family Medicine

## 2017-06-26 VITALS — BP 140/100 | HR 88 | Temp 97.9°F | Resp 16 | Ht 63.0 in | Wt 218.0 lb

## 2017-06-26 DIAGNOSIS — E669 Obesity, unspecified: Secondary | ICD-10-CM | POA: Diagnosis not present

## 2017-06-26 DIAGNOSIS — E119 Type 2 diabetes mellitus without complications: Secondary | ICD-10-CM | POA: Diagnosis not present

## 2017-06-26 DIAGNOSIS — B0230 Zoster ocular disease, unspecified: Secondary | ICD-10-CM

## 2017-06-26 DIAGNOSIS — G4700 Insomnia, unspecified: Secondary | ICD-10-CM

## 2017-06-26 DIAGNOSIS — B0222 Postherpetic trigeminal neuralgia: Secondary | ICD-10-CM

## 2017-06-26 DIAGNOSIS — E0800 Diabetes mellitus due to underlying condition with hyperosmolarity without nonketotic hyperglycemic-hyperosmolar coma (NKHHC): Secondary | ICD-10-CM

## 2017-06-26 DIAGNOSIS — I10 Essential (primary) hypertension: Secondary | ICD-10-CM | POA: Diagnosis not present

## 2017-06-26 LAB — POCT GLYCOSYLATED HEMOGLOBIN (HGB A1C)
Est. average glucose Bld gHb Est-mCnc: 137
Hemoglobin A1C: 6.4

## 2017-06-26 MED ORDER — ATORVASTATIN CALCIUM 40 MG PO TABS: 40.0000 mg | ORAL_TABLET | Freq: Every day | ORAL | 3 refills | Status: DC

## 2017-06-26 MED ORDER — NORTRIPTYLINE HCL 25 MG PO CAPS: ORAL_CAPSULE | ORAL | 0 refills | Status: DC

## 2017-06-26 MED ORDER — LOSARTAN POTASSIUM 50 MG PO TABS: 50.0000 mg | ORAL_TABLET | Freq: Every day | ORAL | 3 refills | Status: DC

## 2017-06-26 NOTE — Assessment & Plan Note (Signed)
Well controlled Advised on scheduling annual eye exam - patient to call Advised on diet and exercise UTD on foot exam

## 2017-06-26 NOTE — Patient Instructions (Signed)
Increase nortriptyline to 50mg  daily at bedtime for 1 week and then 75mg  daily at bedtime for 1 week and then 100mg  at bedtime If you get too sleepy on a dose, give it a few days.  If still too sleepy in the mornings, decrease down one dose  Increase Losartan to 50mg  daily  No change in cholesterol medicine or gabapentin  No meds needed for diabetes.  Call eye doctor for follow-up eye exam

## 2017-06-26 NOTE — Assessment & Plan Note (Addendum)
Patient will make f/u appt with Optho

## 2017-06-26 NOTE — Assessment & Plan Note (Signed)
Above goal Patient is not taking HCTZ She is having no edema, so will discontinue Will instead increase losartan to 50mg  daily Recent cmp wnl F/u in 6 wks

## 2017-06-26 NOTE — Assessment & Plan Note (Signed)
Will ramp up nortriptyline dose to 100mg  qhs If does not help with sleep/pain, will consider switching back to trazodone

## 2017-06-26 NOTE — Assessment & Plan Note (Addendum)
Uncontrolled Continues to have pain in R V1 distribution Continue gabapentin at max dose Increase nortriptyline as below Referral to Neuro per patient request

## 2017-06-26 NOTE — Assessment & Plan Note (Signed)
Discussed diet and exercise 

## 2017-06-26 NOTE — Progress Notes (Signed)
Patient: [name] Female    DOB: 01-Apr-1951   67 y.o.   MRN: [id number] Visit Date: 06/26/2017  Today's Provider: Lavon Paganini, MD   I, Martha Clan, CMA, am acting as scribe for Lavon Paganini, MD.  Chief Complaint  Patient presents with  . Diabetes  . Hypertension   Subjective:    HPI      Diabetes Mellitus Type II, Follow-up:   Lab Results  Component Value Date   HGBA1C 6.4 02/24/2017   HGBA1C 6.6 (H) 12/02/2016   HGBA1C 6.8 (H) 02/29/2016    Last seen for diabetes 4 months ago.  Management since then includes no changes; was controlled. She reports good compliance with treatment.  Current symptoms include increase appetite and visual disturbances and have been worsening. Home blood sugar records: not being checked  Episodes of hypoglycemia? no   Current Insulin Regimen: N/A Most Recent Eye Exam: due for eye exam; goes to [facility]. Needs appointment. Has neuropathy of eye s/p shingles. States this worsening, and her right eye is swelling. Weight trend: stable Prior visit with dietician: yes - after being diagnosed Current diet: in general, an "unhealthy" diet Current exercise: none  Pertinent Labs:    Component Value Date/Time   CHOL 182 03/26/2017 0905   CHOL 181 02/29/2016 0956   CHOL 210 (H) 11/03/2013 0921   TRIG 132 03/26/2017 0905   TRIG 97 11/03/2013 0921   HDL 46 (L) 03/26/2017 0905   HDL 48 02/29/2016 0956   HDL 50 11/03/2013 0921   LDLCALC 108 (H) 02/29/2016 0956   LDLCALC 141 (H) 11/03/2013 0921   CREATININE 0.96 03/26/2017 0905    Wt Readings from Last 3 Encounters:  06/26/17 218 lb (98.9 kg)  05/05/17 218 lb (98.9 kg)  04/27/17 218 lb (98.9 kg)    ------------------------------------------------------------------------  Hypertension, follow-up:  BP Readings from Last 3 Encounters:  06/26/17 (!) 140/100  05/05/17 133/89  04/27/17 105/61    She was last seen for hypertension 3 months ago.  BP at  that visit was 102/80. Management since that visit includes decreasing Losartan to 12.5 mg to improve fatigue, and continuing HCTZ. She misunderstood directions, and D/C her HCTZ, and is taking 25 mg of Losartan. She is adherent to low salt diet.   Outside blood pressures are high per pt, which is why she increased her Losartan back to 25 mg. She is experiencing fatigue.  Patient denies chest pain, chest pressure/discomfort, claudication, dyspnea, exertional chest pressure/discomfort, irregular heart beat, lower extremity edema, near-syncope, orthopnea, palpitations and syncope.   Cardiovascular risk factors include advanced age (older than 70 for men, 63 for women), diabetes mellitus, dyslipidemia, hypertension and microalbuminuria.   ------------------------------------------------------------------------   Allergies  Allergen Reactions  . Lisinopril Cough  . Lovastatin Other (See Comments)    Myalgia  . Shellfish Allergy Rash     Current Outpatient Medications:  .  aspirin EC 81 MG tablet, Take by mouth., Disp: , Rfl:  .  atorvastatin (LIPITOR) 40 MG tablet, Take 1 tablet (40 mg total) by mouth daily., Disp: 90 tablet, Rfl: 3 .  Carboxymethylcellulose Sodium (LUBRICANT EYE DROPS OP), Place 1 drop into both eyes 4 (four) times daily as needed (FOR DRY EYES)., Disp: , Rfl:  .  gabapentin (NEURONTIN) 600 MG tablet, Take 1.5 tablets (900 mg total) by mouth every 6 (six) hours., Disp: 540 tablet, Rfl: 3 .  lansoprazole (PREVACID) 15 MG capsule, Take by mouth., Disp: , Rfl:  .  losartan (COZAAR) 25 MG tablet, Take 0.5 tablets (12.5 mg total) by mouth daily., Disp: 45 tablet, Rfl: 3 .  nortriptyline (PAMELOR) 25 MG capsule, Take 25 mg by mouth at bedtime., Disp: , Rfl:  .  albuterol (PROVENTIL HFA;VENTOLIN HFA) 108 (90 Base) MCG/ACT inhaler, Inhale 2 puffs into the lungs every 4 (four) hours as needed for wheezing or shortness of breath. (Patient not taking: Reported on 06/26/2017), Disp: 1  Inhaler, Rfl: 0 .  calcium-vitamin D (OSCAL WITH D) 500-200 MG-UNIT TABS tablet, Take by mouth., Disp: , Rfl:  .  fluticasone furoate-vilanterol (BREO ELLIPTA) 100-25 MCG/INH AEPB, Inhale 1 puff into the lungs daily. (Patient not taking: Reported on 06/26/2017), Disp: 28 each, Rfl: 6 .  hydrochlorothiazide (HYDRODIURIL) 25 MG tablet, Take 1 tablet (25 mg total) by mouth daily. (Patient not taking: Reported on 06/26/2017), Disp: 90 tablet, Rfl: 3 .  metFORMIN (GLUCOPHAGE) 500 MG tablet, Take by mouth., Disp: , Rfl:  .  montelukast (SINGULAIR) 10 MG tablet, Take 10 mg by mouth., Disp: , Rfl:  .  Multiple Vitamin (MULTIVITAMIN WITH MINERALS) TABS tablet, Take 1 tablet by mouth daily. WOMEN'S MULTIVITAMIN, Disp: , Rfl:  .  traZODone (DESYREL) 50 MG tablet, Take by mouth., Disp: , Rfl:   Review of Systems  Constitutional: Positive for appetite change, diaphoresis and fatigue. Negative for activity change, chills, fever and unexpected weight change.  Eyes: Positive for photophobia, pain, discharge, redness, itching and visual disturbance.  Respiratory: Negative for shortness of breath.   Cardiovascular: Negative for chest pain, palpitations and leg swelling.  Endocrine: Positive for polyphagia. Negative for polydipsia and polyuria.    Social History   Tobacco Use  . Smoking status: Never Smoker  . Smokeless tobacco: Never Used  Substance Use Topics  . Alcohol use: No    Alcohol/week: 0.0 oz   Objective:   BP (!) 140/100 (BP Location: Left Arm, Patient Position: Sitting, Cuff Size: Large)   Pulse 88   Temp 97.9 F (36.6 C) (Oral)   Resp 16   Ht 5\' 3"  (1.6 m)   Wt 218 lb (98.9 kg)   SpO2 96%   BMI 38.62 kg/m  Vitals:   06/26/17 0840  BP: (!) 140/100  Pulse: 88  Resp: 16  Temp: 97.9 F (36.6 C)  TempSrc: Oral  SpO2: 96%  Weight: 218 lb (98.9 kg)  Height: 5\' 3"  (1.6 m)     Physical Exam  Constitutional: She is oriented to person, place, and time. She appears well-developed and  well-nourished. No distress.  HENT:  Head: Normocephalic and atraumatic.  Mouth/Throat: Oropharynx is clear and moist.  Eyes: Conjunctivae and EOM are normal. Pupils are equal, round, and reactive to light. No scleral icterus.  Neck: Neck supple. No thyromegaly present.  Cardiovascular: Normal rate, regular rhythm, normal heart sounds and intact distal pulses.  No murmur heard. Pulmonary/Chest: Effort normal and breath sounds normal. No respiratory distress. She has no wheezes. She has no rales.  Musculoskeletal: She exhibits no edema or deformity.  Lymphadenopathy:    She has no cervical adenopathy.  Neurological: She is alert and oriented to person, place, and time.  Cranial nerves intact, but TTP along R V1 distribution  Skin: Skin is warm and dry. No rash noted.  Psychiatric: She has a normal mood and affect. Her behavior is normal.  Vitals reviewed.       Assessment & Plan:      Problem List Items Addressed This Visit  Cardiovascular and Mediastinum   Hypertension    Above goal Patient is not taking HCTZ She is having no edema, so will discontinue Will instead increase losartan to 50mg  daily Recent cmp wnl F/u in 6 wks      Relevant Medications   losartan (COZAAR) 50 MG tablet   atorvastatin (LIPITOR) 40 MG tablet     Endocrine   Diabetes ([place]) - Primary    Well controlled Advised on scheduling annual eye exam - patient to call Advised on diet and exercise UTD on foot exam      Relevant Medications   losartan (COZAAR) 50 MG tablet   atorvastatin (LIPITOR) 40 MG tablet   Other Relevant Orders   POCT glycosylated hemoglobin (Hb A1C) (Completed)     Nervous and Auditory   Neuropathic postherpetic trigeminal neuralgia (Location of Primary Source of Pain) (Right) (V1) (Chronic)    Uncontrolled Continues to have pain in R V1 distribution Continue gabapentin at max dose Increase nortriptyline as below Referral to Neuro per patient request      Relevant  Medications   nortriptyline (PAMELOR) 25 MG capsule   Other Relevant Orders   Ambulatory referral to Neurology   Herpes zoster ophthalmicus    Patient will make f/u appt with Optho      Relevant Medications   nortriptyline (PAMELOR) 25 MG capsule   Other Relevant Orders   Ambulatory referral to Neurology     Other   Obesity (BMI 35.0-39.9 without comorbidity) ([place])    Discussed diet and exercise      Cannot sleep    Will ramp up nortriptyline dose to 100mg  qhs If does not help with sleep/pain, will consider switching back to trazodone         Return in about 6 weeks (around 08/07/2017).     The entirety of the information documented in the History of Present Illness, Review of Systems and Physical Exam were personally obtained by me. Portions of this information were initially documented by [name], CMA and reviewed by me for thoroughness and accuracy.    Virginia Crews, MD, MPH [facility] 06/26/2017 9:42 AM

## 2017-07-02 ENCOUNTER — Ambulatory Visit: Payer: Medicare Other

## 2017-07-02 ENCOUNTER — Other Ambulatory Visit: Payer: Medicare Other

## 2017-07-02 DIAGNOSIS — Z96652 Presence of left artificial knee joint: Secondary | ICD-10-CM | POA: Diagnosis not present

## 2017-07-23 DIAGNOSIS — B0222 Postherpetic trigeminal neuralgia: Secondary | ICD-10-CM | POA: Diagnosis not present

## 2017-07-23 DIAGNOSIS — B0229 Other postherpetic nervous system involvement: Secondary | ICD-10-CM | POA: Diagnosis not present

## 2017-08-07 ENCOUNTER — Encounter: Payer: Self-pay | Admitting: Family Medicine

## 2017-08-07 ENCOUNTER — Ambulatory Visit (INDEPENDENT_AMBULATORY_CARE_PROVIDER_SITE_OTHER): Payer: Medicare Other | Admitting: Family Medicine

## 2017-08-07 VITALS — BP 128/94 | HR 108 | Temp 97.9°F | Resp 16 | Wt 219.0 lb

## 2017-08-07 DIAGNOSIS — M25561 Pain in right knee: Secondary | ICD-10-CM

## 2017-08-07 DIAGNOSIS — M25562 Pain in left knee: Secondary | ICD-10-CM

## 2017-08-07 DIAGNOSIS — I10 Essential (primary) hypertension: Secondary | ICD-10-CM | POA: Diagnosis not present

## 2017-08-07 DIAGNOSIS — W19XXXA Unspecified fall, initial encounter: Secondary | ICD-10-CM | POA: Diagnosis not present

## 2017-08-07 DIAGNOSIS — G894 Chronic pain syndrome: Secondary | ICD-10-CM

## 2017-08-07 DIAGNOSIS — M792 Neuralgia and neuritis, unspecified: Secondary | ICD-10-CM | POA: Diagnosis not present

## 2017-08-07 MED ORDER — LOSARTAN POTASSIUM 100 MG PO TABS: 100.0000 mg | ORAL_TABLET | Freq: Every day | ORAL | 3 refills | Status: DC

## 2017-08-07 MED ORDER — NORTRIPTYLINE HCL 75 MG PO CAPS: 75.0000 mg | ORAL_CAPSULE | Freq: Every day | ORAL | 3 refills | Status: DC

## 2017-08-07 NOTE — Assessment & Plan Note (Signed)
S/p fall and landed on b/l knees S/p b/l TKR No evidence of swelling or injury on exam Advised rest, ice, tylenol Return precautions discussed

## 2017-08-07 NOTE — Assessment & Plan Note (Signed)
Improving slightly Chronic  Continue gabapentin and nortriptyline

## 2017-08-07 NOTE — Patient Instructions (Signed)

## 2017-08-07 NOTE — Assessment & Plan Note (Signed)
Uncontrolled, but improving Continue to hold HCTZ Increase losartan to 100mg  daily F/u in 3 months

## 2017-08-07 NOTE — Progress Notes (Signed)
Patient: [name] Female    DOB: [birth year]   67 y.o.   MRN: [id number] Visit Date: 08/07/2017  Today's Provider: Lavon Paganini, MD   I, Martha Clan, CMA, am acting as scribe for Lavon Paganini, MD.  Chief Complaint  Patient presents with  . Hypertension  . Fall   Subjective:    HPI      Hypertension, follow-up:  BP Readings from Last 3 Encounters:  08/07/17 (!) 128/94  06/26/17 (!) 140/100  05/05/17 133/89    She was last seen for hypertension 6 weeks ago.  BP at that visit was 140/100. Management since that visit includes D/C HCTZ and increasing Losartan to 50 mg po qd. She reports good compliance with treatment. She is not having side effects.  She is not exercising. She is adherent to low salt diet.   Outside blood pressures are not being checked. She is experiencing fatigue. (improving per pt) Patient denies chest pain, chest pressure/discomfort, claudication, dyspnea, exertional chest pressure/discomfort, irregular heart beat, lower extremity edema, near-syncope, orthopnea, palpitations and syncope.   Cardiovascular risk factors include advanced age (older than 90 for men, 64 for women), diabetes mellitus, dyslipidemia and obesity (BMI >= 30 kg/m2).  Use of agents associated with hypertension: none.     Weight trend: stable Wt Readings from Last 3 Encounters:  08/07/17 219 lb (99.3 kg)  06/26/17 218 lb (98.9 kg)  05/05/17 218 lb (98.9 kg)    Current diet: in general, a "healthy" diet    ------------------------------------------------------------------------ Pt states she is taking Nortriptyline and has increased to 75mg  qhs, which was prescribed at [facility] for her postherpetic pain.  She is sleeping well and pain has decreased.  States she feels better than she has in a long time.  Pt states she fell yesterday at home. She was carrying a laundry basket down the stairs, missed a step and fell on her knees and right hand. She states her  knees feel "sore".  She has h/o b/l TKR.  Allergies  Allergen Reactions  . Lisinopril Cough  . Lovastatin Other (See Comments)    Myalgia  . Shellfish Allergy Rash     Current Outpatient Medications:  .  albuterol (PROVENTIL HFA;VENTOLIN HFA) 108 (90 Base) MCG/ACT inhaler, Inhale 2 puffs into the lungs every 4 (four) hours as needed for wheezing or shortness of breath., Disp: 1 Inhaler, Rfl: 0 .  aspirin EC 81 MG tablet, Take by mouth., Disp: , Rfl:  .  atorvastatin (LIPITOR) 40 MG tablet, Take 1 tablet (40 mg total) by mouth daily., Disp: 90 tablet, Rfl: 3 .  Carboxymethylcellulose Sodium (LUBRICANT EYE DROPS OP), Place 1 drop into both eyes 4 (four) times daily as needed (FOR DRY EYES)., Disp: , Rfl:  .  gabapentin (NEURONTIN) 600 MG tablet, Take 1.5 tablets (900 mg total) by mouth every 6 (six) hours., Disp: 540 tablet, Rfl: 3 .  losartan (COZAAR) 100 MG tablet, Take 1 tablet (100 mg total) by mouth daily., Disp: 90 tablet, Rfl: 3 .  nortriptyline (PAMELOR) 75 MG capsule, Take 1 capsule (75 mg total) by mouth at bedtime., Disp: 90 capsule, Rfl: 3  Review of Systems  Constitutional: Positive for fatigue. Negative for activity change, appetite change, chills, diaphoresis, fever and unexpected weight change.  Respiratory: Negative for apnea, cough, chest tightness, shortness of breath and wheezing.   Cardiovascular: Negative for chest pain, palpitations and leg swelling.  Gastrointestinal: Negative.   Genitourinary: Negative.   Musculoskeletal: Positive  for arthralgias. Negative for back pain, gait problem and joint swelling.  Skin: Negative.   Neurological: Negative.   Psychiatric/Behavioral: Negative.     Social History   Tobacco Use  . Smoking status: Never Smoker  . Smokeless tobacco: Never Used  Substance Use Topics  . Alcohol use: No    Alcohol/week: 0.0 oz   Objective:   BP (!) 128/94 (BP Location: Right Arm, Patient Position: Sitting, Cuff Size: Large)   Pulse (!)  108   Temp 97.9 F (36.6 C) (Oral)   Resp 16   Wt 219 lb (99.3 kg)   SpO2 96%   BMI 38.79 kg/m  Vitals:   08/07/17 1103  BP: (!) 128/94  Pulse: (!) 108  Resp: 16  Temp: 97.9 F (36.6 C)  TempSrc: Oral  SpO2: 96%  Weight: 219 lb (99.3 kg)     Physical Exam  Constitutional: She is oriented to person, place, and time. She appears well-developed and well-nourished. No distress.  HENT:  Head: Normocephalic and atraumatic.  Eyes: Conjunctivae are normal. No scleral icterus.  Cardiovascular: Normal rate, regular rhythm, normal heart sounds and intact distal pulses.  No murmur heard. Pulmonary/Chest: Effort normal and breath sounds normal. No respiratory distress. She has no wheezes. She has no rales.  Musculoskeletal: She exhibits no edema.  B/l Knee: Normal to inspection with no erythema or effusion or obvious bony abnormalities.  Palpation normal with no warmth or joint line tenderness or patellar tenderness or condyle tenderness. ROM normal in flexion and extension and lower leg rotation. Patellar and quadriceps tendons unremarkable. Hamstring and quadriceps strength is normal.  Neurological: She is alert and oriented to person, place, and time.  Skin: Skin is warm and dry. No rash noted.  Psychiatric: She has a normal mood and affect. Her behavior is normal.  Vitals reviewed.       Assessment & Plan:     Problem List Items Addressed This Visit      Cardiovascular and Mediastinum   Hypertension - Primary    Uncontrolled, but improving Continue to hold HCTZ Increase losartan to 100mg  daily F/u in 3 months      Relevant Medications   losartan (COZAAR) 100 MG tablet     Other   Neuropathic pain (Chronic)    Improving slightly Chronic  Continue gabapentin and nortriptyline      Chronic pain syndrome (Chronic)    See above      Relevant Medications   nortriptyline (PAMELOR) 75 MG capsule   Acute pain of both knees    S/p fall and landed on b/l knees S/p  b/l TKR No evidence of swelling or injury on exam Advised rest, ice, tylenol Return precautions discussed       Other Visit Diagnoses    Fall, initial encounter           Return in about 3 months (around 11/04/2017) for chronic disease f/u.   The entirety of the information documented in the History of Present Illness, Review of Systems and Physical Exam were personally obtained by me. Portions of this information were initially documented by Raquel Sarna Ratchford, CMA and reviewed by me for thoroughness and accuracy.    Virginia Crews, MD, MPH [facility] 08/07/2017 1:17 PM

## 2017-08-07 NOTE — Assessment & Plan Note (Signed)
See above

## 2017-09-09 ENCOUNTER — Other Ambulatory Visit: Payer: Self-pay | Admitting: Nurse Practitioner

## 2017-09-09 ENCOUNTER — Telehealth: Payer: Self-pay | Admitting: Nurse Practitioner

## 2017-09-09 DIAGNOSIS — G501 Atypical facial pain: Secondary | ICD-10-CM

## 2017-09-09 DIAGNOSIS — M792 Neuralgia and neuritis, unspecified: Secondary | ICD-10-CM

## 2017-09-09 DIAGNOSIS — B0222 Postherpetic trigeminal neuralgia: Secondary | ICD-10-CM

## 2017-09-09 MED ORDER — NONFORMULARY OR COMPOUNDED ITEM: 0 refills | Status: DC

## 2017-09-09 NOTE — Telephone Encounter (Signed)
Attempted to call patient to let her know that compounded cream has been faxed to [facility].

## 2017-09-09 NOTE — Telephone Encounter (Signed)
Printed Rx compounded cream faxed to [facility] [phone]

## 2017-09-09 NOTE — Telephone Encounter (Signed)
Patient needs script for pain cream sent to [facility] in [place]  Phone [phone] fax [phone] Please call patient when she can go pick up med

## 2017-10-20 ENCOUNTER — Ambulatory Visit: Payer: Self-pay | Admitting: Surgery

## 2017-10-22 ENCOUNTER — Ambulatory Visit
Admission: RE | Admit: 2017-10-22 | Discharge: 2017-10-22 | Disposition: A | Discharge status: Home / Self Care | Payer: Medicare Other | Source: Ambulatory Visit | Attending: General Surgery | Admitting: General Surgery

## 2017-10-22 DIAGNOSIS — Z1231 Encounter for screening mammogram for malignant neoplasm of breast: Secondary | ICD-10-CM

## 2017-10-26 ENCOUNTER — Ambulatory Visit: Payer: Medicare Other | Admitting: General Surgery

## 2017-10-26 ENCOUNTER — Telehealth: Payer: Self-pay | Admitting: Surgery

## 2017-10-26 NOTE — Telephone Encounter (Signed)
Patient notified of Mammogram and instructed to keep her appointment later this month for breast exam.

## 2017-10-26 NOTE — Telephone Encounter (Signed)
When calling the patient to r/s her appointment. Patient asked if her mammogram results have came in. Please call patient and advise.

## 2017-10-28 ENCOUNTER — Other Ambulatory Visit: Payer: Self-pay

## 2017-10-28 ENCOUNTER — Ambulatory Visit: Payer: Medicare Other | Attending: Nurse Practitioner | Admitting: Nurse Practitioner

## 2017-10-28 ENCOUNTER — Encounter: Payer: Self-pay | Admitting: Nurse Practitioner

## 2017-10-28 VITALS — BP 135/64 | HR 99 | Temp 98.0°F | Resp 16 | Ht 63.0 in | Wt 220.0 lb

## 2017-10-28 DIAGNOSIS — N644 Mastodynia: Secondary | ICD-10-CM | POA: Insufficient documentation

## 2017-10-28 DIAGNOSIS — Z7982 Long term (current) use of aspirin: Secondary | ICD-10-CM | POA: Diagnosis not present

## 2017-10-28 DIAGNOSIS — Z800 Family history of malignant neoplasm of digestive organs: Secondary | ICD-10-CM | POA: Diagnosis not present

## 2017-10-28 DIAGNOSIS — I10 Essential (primary) hypertension: Secondary | ICD-10-CM | POA: Insufficient documentation

## 2017-10-28 DIAGNOSIS — Z9049 Acquired absence of other specified parts of digestive tract: Secondary | ICD-10-CM | POA: Diagnosis not present

## 2017-10-28 DIAGNOSIS — J45909 Unspecified asthma, uncomplicated: Secondary | ICD-10-CM | POA: Diagnosis not present

## 2017-10-28 DIAGNOSIS — Z86010 Personal history of colonic polyps: Secondary | ICD-10-CM | POA: Insufficient documentation

## 2017-10-28 DIAGNOSIS — Z9889 Other specified postprocedural states: Secondary | ICD-10-CM | POA: Insufficient documentation

## 2017-10-28 DIAGNOSIS — G501 Atypical facial pain: Secondary | ICD-10-CM | POA: Diagnosis not present

## 2017-10-28 DIAGNOSIS — E119 Type 2 diabetes mellitus without complications: Secondary | ICD-10-CM | POA: Diagnosis not present

## 2017-10-28 DIAGNOSIS — Z833 Family history of diabetes mellitus: Secondary | ICD-10-CM | POA: Insufficient documentation

## 2017-10-28 DIAGNOSIS — Z888 Allergy status to other drugs, medicaments and biological substances status: Secondary | ICD-10-CM | POA: Diagnosis not present

## 2017-10-28 DIAGNOSIS — Z8249 Family history of ischemic heart disease and other diseases of the circulatory system: Secondary | ICD-10-CM | POA: Insufficient documentation

## 2017-10-28 DIAGNOSIS — B0229 Other postherpetic nervous system involvement: Secondary | ICD-10-CM | POA: Diagnosis not present

## 2017-10-28 DIAGNOSIS — E669 Obesity, unspecified: Secondary | ICD-10-CM | POA: Diagnosis not present

## 2017-10-28 DIAGNOSIS — F329 Major depressive disorder, single episode, unspecified: Secondary | ICD-10-CM | POA: Diagnosis not present

## 2017-10-28 DIAGNOSIS — E7800 Pure hypercholesterolemia, unspecified: Secondary | ICD-10-CM | POA: Insufficient documentation

## 2017-10-28 DIAGNOSIS — Z96653 Presence of artificial knee joint, bilateral: Secondary | ICD-10-CM | POA: Diagnosis not present

## 2017-10-28 DIAGNOSIS — B0222 Postherpetic trigeminal neuralgia: Secondary | ICD-10-CM | POA: Diagnosis not present

## 2017-10-28 DIAGNOSIS — Z79899 Other long term (current) drug therapy: Secondary | ICD-10-CM | POA: Insufficient documentation

## 2017-10-28 DIAGNOSIS — F419 Anxiety disorder, unspecified: Secondary | ICD-10-CM | POA: Diagnosis not present

## 2017-10-28 DIAGNOSIS — K219 Gastro-esophageal reflux disease without esophagitis: Secondary | ICD-10-CM | POA: Insufficient documentation

## 2017-10-28 DIAGNOSIS — Z91013 Allergy to seafood: Secondary | ICD-10-CM | POA: Diagnosis not present

## 2017-10-28 DIAGNOSIS — Z820 Family history of epilepsy and other diseases of the nervous system: Secondary | ICD-10-CM | POA: Diagnosis not present

## 2017-10-28 DIAGNOSIS — M792 Neuralgia and neuritis, unspecified: Secondary | ICD-10-CM

## 2017-10-28 DIAGNOSIS — B0230 Zoster ocular disease, unspecified: Secondary | ICD-10-CM | POA: Insufficient documentation

## 2017-10-28 MED ORDER — NONFORMULARY OR COMPOUNDED ITEM: 1 refills | Status: DC

## 2017-10-28 NOTE — Patient Instructions (Addendum)
  BMI Assessment: Estimated body mass index is 38.97 kg/m as calculated from the following:   Height as of this encounter: 5\' 3"  (1.6 m).   Weight as of this encounter: 220 lb (99.8 kg).  BMI interpretation table: BMI level Category Range association with higher incidence of chronic pain  <18 kg/m2 Underweight   18.5-24.9 kg/m2 Ideal body weight   25-29.9 kg/m2 Overweight Increased incidence by 20%  30-34.9 kg/m2 Obese (Class I) Increased incidence by 68%  35-39.9 kg/m2 Severe obesity (Class II) Increased incidence by 136%  >40 kg/m2 Extreme obesity (Class III) Increased incidence by 254%   Patient's current BMI Ideal Body weight  Body mass index is 38.97 kg/m. Ideal body weight: 52.4 kg (115 lb 8.3 oz) Adjusted ideal body weight: 71.4 kg (157 lb 5 oz)   BMI Readings from Last 4 Encounters:  10/28/17 38.97 kg/m  08/07/17 38.79 kg/m  06/26/17 38.62 kg/m  05/05/17 38.62 kg/m   Wt Readings from Last 4 Encounters:  10/28/17 220 lb (99.8 kg)  08/07/17 219 lb (99.3 kg)  06/26/17 218 lb (98.9 kg)  05/05/17 218 lb (98.9 kg)

## 2017-10-28 NOTE — Progress Notes (Signed)
Patient's Name: [name]  MRN: [id number]  Referring Provider: Virginia Crews, MD  DOB: [birth year]  PCP: Virginia Crews, MD  DOS: 10/28/2017  Note by: Vevelyn Francois NP  Service setting: Ambulatory outpatient  Specialty: Interventional Pain Management  Location: [facility] (AMB) Pain Management Facility    Patient type: Established    Primary Reason(s) for Visit: Encounter for prescription drug management. (Level of risk: moderate)  CC: Appointment  HPI  Ms. Bartosik is a 67 y.o. year old, female patient, who comes today for a medication management evaluation. She has Depression; Obesity (BMI 35.0-39.9 without comorbidity) ([place]); Neuropathic postherpetic trigeminal neuralgia (Location of Primary Source of Pain) (Right) (V1); Anxiety; Asthma; Diabetes ([place]); Photopsia; History of colon polyps; Calcium deficiency disease; Cannot sleep; Hypercholesterolemia without hypertriglyceridemia; Avitaminosis A; Avitaminosis D; Gasserian ganglionitis; Neuropathic pain; Chronic pain syndrome; Occipital headache; Bilateral occipital neuralgia; Herpes zoster ophthalmicus; Hypertension; Primary osteoarthritis of left knee; Status post total left knee replacement; Primary osteoarthritis involving multiple joints; Microalbuminuria; and Acute pain of both knees on their problem list. Her primarily concern today is the Appointment  Pain Assessment: Location: Right Head Radiating: Right side of face; from nose to eye to temporal area of head right side.  Onset: More than a month ago Duration: Chronic pain Quality: Sore, Constant Severity: 6 /10 (subjective, self-reported pain score)  Note: Reported level is compatible with observation. Clinically the patient looks like a 3/10 A 3/10 is viewed as "Moderate" and described as significantly interfering with activities of daily living (ADL). It becomes difficult to feed, bathe, get dressed, get on and off the toilet or to perform personal hygiene functions.  Difficult to get in and out of bed or a chair without assistance. Very distracting. With effort, it can be ignored when deeply involved in activities. Information on the proper use of the pain scale provided to the patient today.  When using our objective Pain Scale, levels between 6 and 10/10 are said to belong in an emergency room, as it progressively worsens from a 6/10, described as severely limiting, requiring emergency care not usually available at an outpatient pain management facility. At a 6/10 level, communication becomes difficult and requires great effort. Assistance to reach the emergency department may be required. Facial flushing and profuse sweating along with potentially dangerous increases in heart rate and blood pressure will be evident. Effect on ADL: Denies  Timing: Constant Modifying factors: Medications; gapapentin  BP: 135/64  HR: 99  Ms. Kneeland was last scheduled for an appointment on 09/09/2017 for medication management. During today's appointment we reviewed Ms. Reckner's chronic pain status, as well as her outpatient medication regimen. She admits that she continues to have the head pain. She is in more pain secondary to being out of her topical cream. She admits that pharmacy is waiting on one medication within the compound to complete her prescription. She is not sure which med this is. She admits that she was started on Tegretol by her PCP. She admits that she just took her first dose on last night. She is concern that this may interact with her Gabapentin. She does not want to take too much medication.   The patient  reports that she does not use drugs. Her body mass index is 38.97 kg/m.  Further details on both, my assessment(s), as well as the proposed treatment plan, please see below.  Controlled Substance Pharmacotherapy Assessment REMS (Risk Evaluation and Mitigation Strategy)  Analgesic:None MME/day: 0 mg/day.  Odetta Pink, Estill Bamberg  N, RN  10/28/2017  9:47 AM  Sign  at close encounter Safety precautions to be maintained throughout the outpatient stay will include: orient to surroundings, keep bed in low position, maintain call bell within reach at all times, provide assistance with transfer out of bed and ambulation.    Pharmacokinetics: Liberation and absorption (onset of action): WNL Distribution (time to peak effect): WNL Metabolism and excretion (duration of action): WNL         Pharmacodynamics: Desired effects: Analgesia: Ms. Galer reports >50% benefit. Functional ability: Patient reports that medication allows her to accomplish basic ADLs Clinically meaningful improvement in function (CMIF): Sustained CMIF goals met Perceived effectiveness: Described as relatively effective, allowing for increase in activities of daily living (ADL) Undesirable effects: Side-effects or Adverse reactions: None reported Monitoring: [place] PMP: Online review of the past 56-monthperiod conducted. Compliant with practice rules and regulations Last UDS on record: Summary  Date Value Ref Range Status  01/21/2016 FINAL  Final    Comment:    ==================================================================== TOXASSURE COMP DRUG ANALYSIS,UR ==================================================================== Test                             Result       Flag       Units Drug Present and Declared for Prescription Verification   Gabapentin                     PRESENT      EXPECTED   Trazodone                      PRESENT      EXPECTED   1,3 chlorophenyl piperazine    PRESENT      EXPECTED    1,3-chlorophenyl piperazine is an expected metabolite of    trazodone. Drug Present not Declared for Prescription Verification   Acetaminophen                  PRESENT      UNEXPECTED   Naproxen                       PRESENT      UNEXPECTED   Diphenhydramine                PRESENT      UNEXPECTED Drug Absent but Declared for Prescription Verification   Salicylate                      Not Detected UNEXPECTED    Aspirin, as indicated in the declared medication list, is not    always detected even when used as directed. ==================================================================== Test                      Result    Flag   Units      Ref Range   Creatinine              66               mg/dL      >=20 ==================================================================== Declared Medications:  The flagging and interpretation on this report are based on the  following declared medications.  Unexpected results may arise from  inaccuracies in the declared medications.  **Note: The testing scope of this panel includes these medications:  Gabapentin (Neurontin)  Trazodone  **Note: The testing scope of this  panel does not include small to  moderate amounts of these reported medications:  Aspirin (Aspirin 81)  **Note: The testing scope of this panel does not include following  reported medications:  Albuterol  Amlodipine (Norvasc)  Fluticasone (Breo)  Hydrochlorothiazide (Microzide)  Lansoprazole (Prevacid)  Metformin  Olopatadine  Pravastatin  Vilanterol (Breo) ==================================================================== For clinical consultation, please call [phone]. ====================================================================    UDS interpretation: Compliant          Medication Assessment Form: Reviewed. Patient indicates being compliant with therapy Treatment compliance: Compliant Risk Assessment Profile: Aberrant behavior: See prior evaluations. None observed or detected today Comorbid factors increasing risk of overdose: See prior notes. No additional risks detected today Risk of substance use disorder (SUD): Low Opioid Risk Tool - 10/28/17 0947      Family History of Substance Abuse   Alcohol  Negative    Illegal Drugs  Positive Female    Rx Drugs  Negative      Personal History of Substance Abuse   Alcohol  Negative     Illegal Drugs  Negative    Rx Drugs  Negative      Age   Age between 69-45 years   No      History of Preadolescent Sexual Abuse   History of Preadolescent Sexual Abuse  Negative or Female      Psychological Disease   Psychological Disease  Negative    Depression  Negative      Total Score   Opioid Risk Tool Scoring  2    Opioid Risk Interpretation  Low Risk      ORT Scoring interpretation table:  Score <3 = Low Risk for SUD  Score between 4-7 = Moderate Risk for SUD  Score >8 = High Risk for Opioid Abuse   Risk Mitigation Strategies:  Patient Counseling: Covered Patient-Prescriber Agreement (PPA): Present and active  Notification to other healthcare providers: Done  Pharmacologic Plan: No change in therapy, at this time.             Laboratory Chemistry  Inflammation Markers (CRP: Acute Phase) (ESR: Chronic Phase) Lab Results  Component Value Date   CRP 1.7 (H) 11/18/2016   ESRSEDRATE 18 11/18/2016                         Rheumatology Markers No results found for: RF, ANA, Therisa Doyne, [facility]                      Renal Function Markers Lab Results  Component Value Date   BUN 13 03/26/2017   CREATININE 0.96 03/26/2017   GFRAA 70 03/26/2017   GFRNONAA 61 03/26/2017                              Hepatic Function Markers Lab Results  Component Value Date   AST 11 03/26/2017   ALT 7 03/26/2017   ALBUMIN 3.9 01/15/2017   ALKPHOS 82 01/15/2017                        Electrolytes Lab Results  Component Value Date   NA 138 03/26/2017   K 4.0 03/26/2017   CL 99 03/26/2017   CALCIUM 9.3 03/26/2017   MG 2.0 07/25/2016  Neuropathy Markers Lab Results  Component Value Date   VITAMINB12 375 07/25/2016   HGBA1C 6.4 06/26/2017                        Bone Pathology Markers Lab Results  Component Value Date   25OHVITD1 32 07/25/2016   25OHVITD2 5.1 07/25/2016   25OHVITD3 27 07/25/2016                          Coagulation Parameters Lab Results  Component Value Date   INR 0.98 11/18/2016   LABPROT 13.0 11/18/2016   APTT 27 11/18/2016   PLT 356 01/15/2017                        Cardiovascular Markers Lab Results  Component Value Date   CKTOTAL 128 03/28/2016   CKMB 1.3 10/11/2013   TROPONINI < 0.02 10/11/2013   HGB 12.9 01/15/2017   HCT 41.2 01/15/2017                         CA Markers No results found for: CEA, CA125, LABCA2                      Note: Lab results reviewed.  Recent Diagnostic Imaging Results  MM SCREENING BREAST TOMO BILATERAL CLINICAL DATA:  Screening.  EXAM: DIGITAL SCREENING BILATERAL MAMMOGRAM WITH TOMO AND CAD  COMPARISON:  Previous exam(s).  ACR Breast Density Category b: There are scattered areas of fibroglandular density.  FINDINGS: There are no findings suspicious for malignancy. Images were processed with CAD.  IMPRESSION: No mammographic evidence of malignancy. A result letter of this screening mammogram will be mailed directly to the patient.  RECOMMENDATION: Screening mammogram in one year. (Code:[id number])  BI-RADS CATEGORY  1: Negative.  Electronically Signed   By: Claudie Revering M.D.   On: 10/22/2017 18:51  Complexity Note: Imaging results reviewed. Results shared with Ms. Mak, using Layman's terms.                         Meds   Current Outpatient Medications:  .  albuterol (PROVENTIL HFA;VENTOLIN HFA) 108 (90 Base) MCG/ACT inhaler, Inhale 2 puffs into the lungs every 4 (four) hours as needed for wheezing or shortness of breath., Disp: 1 Inhaler, Rfl: 0 .  aspirin EC 81 MG tablet, Take by mouth., Disp: , Rfl:  .  atorvastatin (LIPITOR) 40 MG tablet, Take 1 tablet (40 mg total) by mouth daily., Disp: 90 tablet, Rfl: 3 .  carbamazepine (TEGRETOL) 200 MG tablet, Take 200 mg by mouth 2 (two) times daily., Disp: , Rfl:  .  Carboxymethylcellulose Sodium (LUBRICANT EYE DROPS OP), Place 1 drop into both eyes 4 (four) times daily  as needed (FOR DRY EYES)., Disp: , Rfl:  .  gabapentin (NEURONTIN) 600 MG tablet, Take 1.5 tablets (900 mg total) by mouth every 6 (six) hours., Disp: 540 tablet, Rfl: 3 .  losartan (COZAAR) 100 MG tablet, Take 1 tablet (100 mg total) by mouth daily., Disp: 90 tablet, Rfl: 3 .  NONFORMULARY OR COMPOUNDED ITEM, Compounded cream: 6% Gabapentin, 2.5% Lidocaine, 0.5% Meloxicam, 5% Methocarbamol, 2.5% Prilocaine. Sig: Apply 1-2 gm(s) (2-4 pumps) to affected area, 3-4 times/day. (1 pump = 0.5 gm) Dispense: 120 gm Pump Bottle. Dispenser: 1 pump = 0.5 gm., Disp: 1 each, Rfl: 1 .  nortriptyline (  PAMELOR) 75 MG capsule, Take 1 capsule (75 mg total) by mouth at bedtime., Disp: 90 capsule, Rfl: 3  ROS  Constitutional: Denies any fever or chills Gastrointestinal: No reported hemesis, hematochezia, vomiting, or acute GI distress Musculoskeletal: Denies any acute onset joint swelling, redness, loss of ROM, or weakness Neurological: No reported episodes of acute onset apraxia, aphasia, dysarthria, agnosia, amnesia, paralysis, loss of coordination, or loss of consciousness  Allergies  Ms. Grand is allergic to lisinopril; lovastatin; and shellfish allergy.  PFSH  Drug: Ms. Mccallister  reports that she does not use drugs. Alcohol:  reports that she does not drink alcohol. Tobacco:  reports that she has never smoked. She has never used smokeless tobacco. Medical:  has a past medical history of Anxiety, Arthritis, Asthma, Bronchitis (09/04/2016), Diabetes mellitus without complication ([place]), Elevated C-reactive protein (CRP) (10/06/2016), GERD (gastroesophageal reflux disease), History of shingles, Hyperlipidemia, Hypertension, Neuralgia, and Shingles. Surgical: Ms. Rewis  has a past surgical history that includes Total knee arthroplasty (Right, 2014); Abdominal hysterectomy; Total knee arthroplasty (Right, 02/16/2013); Cholecystectomy; Breast lumpectomy with needle localization (Right, 05/02/2016); Breast lumpectomy  (Right, 03/2016); Knee Arthroplasty (Left, 12/01/2016); Breast biopsy (Right, 2016); and Breast biopsy (Left, 04/08/2016). Family: family history includes Anuerysm in her mother; CAD in her father; Dementia in her father and mother; Diabetes in her brother and father; Heart failure in her father; Lupus in her sister; Throat cancer in her maternal grandfather.  Constitutional Exam  General appearance: Well nourished, well developed, and well hydrated. In no apparent acute distress Vitals:   10/28/17 0937  BP: 135/64  Pulse: 99  Resp: 16  Temp: 98 F (36.7 C)  SpO2: 97%  Weight: 220 lb (99.8 kg)  Height: _0  (1.6 m)  Psych/Mental status: Alert, oriented x 3 (person, place, & time)       Eyes: PERLA Respiratory: No evidence of acute respiratory distress  Cervical Spine Area Exam  Skin & Axial Inspection: No masses, redness, edema, swelling, or associated skin lesions Alignment: Symmetrical Functional ROM: Unrestricted ROM      Stability: No instability detected Muscle Tone/Strength: Functionally intact. No obvious neuro-muscular anomalies detected. Sensory (Neurological): Unimpaired Palpation: No palpable anomalies              Gait & Posture Assessment  Ambulation: Unassisted Gait: Relatively normal for age and body habitus Posture: WNL   Assessment  Primary Diagnosis & Pertinent Problem List: The primary encounter diagnosis was Atypical facial pain (Right). Diagnoses of Neuropathic postherpetic trigeminal neuralgia (Right) (V1), Neuropathic pain, and Postherpetic neuralgia were also pertinent to this visit.  Status Diagnosis  Persistent Persistent Persistent 1. Atypical facial pain (Right)   2. Neuropathic postherpetic trigeminal neuralgia (Right) (V1)   3. Neuropathic pain   4. Postherpetic neuralgia     Problems updated and reviewed during this visit: Problem  Herpes Zoster Ophthalmicus   Overview:  Last Assessment & Plan:  Patient will make f/u appt with Optho    Diabetes ([facility])   Overview:  Last Assessment & Plan:  Well controlled Advised on scheduling annual eye exam - patient to call Advised on diet and exercise UTD on foot exam   Neuropathic postherpetic trigeminal neuralgia (Location of Primary Source of Pain) (Right) (V1)   Overview:  Last Assessment & Plan:  Uncontrolled Continues to have pain in R V1 distribution Continue gabapentin at max dose Increase nortriptyline as below Referral to Neuro per patient request   Hypercholesterolemia Without Hypertriglyceridemia   Overview:  Last Assessment & Plan:  Recheck lipid panel, CMP    Plan of Care  Pharmacotherapy (Medications Ordered): Meds ordered this encounter  Medications  . NONFORMULARY OR COMPOUNDED ITEM    Sig: Compounded cream: 6% Gabapentin, 2.5% Lidocaine, 0.5% Meloxicam, 5% Methocarbamol, 2.5% Prilocaine. Sig: Apply 1-2 gm(s) (2-4 pumps) to affected area, 3-4 times/day. (1 pump = 0.5 gm) Dispense: 120 gm Pump Bottle. Dispenser: 1 pump = 0.5 gm.    Dispense:  1 each    Refill:  1    Do not place this medication, or any other prescription from our practice, on "Automatic Refill". Patient may have prescription filled one day early if pharmacy is closed on scheduled refill date.    Order Specific Question:   Supervising Provider    Answer:   Milinda Pointer [[id number]]   New Prescriptions   No medications on file   Medications administered today: Breahna M. Urbach had no medications administered during this visit. Lab-work, procedure(s), and/or referral(s): No orders of the defined types were placed in this encounter.  Imaging and/or referral(s): None  Interventional therapies: Planned, scheduled, and/or pending: Not at this time.   Considering: DiagnosticRight Supraorbital & SupratrochlearNerve Block(Trigeminal Nerve Branches) Possible right supraorbital and supratrochlear RFA Diagnostic Right Trigeminal Nerve (V1) Block, under fluoro and IV sedation.    Palliative PRN treatment(s): DiagnosticRight Supraorbital & SupratrochlearNerve Block(Trigeminal Nerve Branches)  Provider-requested follow-up: Return in about 6 months (around 04/30/2018) for MedMgmt with Me Donella Stade Edison Pace).  Future Appointments  Date Time Provider [place]  11/04/2017  8:20 AM Virginia Crews, MD [id number] None  11/10/2017 10:15 AM Vickie Epley, MD [facility] None  04/26/2018  8:30 AM Vevelyn Francois, NP [facility] None   Primary Care Physician: Virginia Crews, MD Location: [facility] Outpatient Pain Management Facility Note by: Vevelyn Francois NP Date: 10/28/2017; Time: 3:11 PM  Pain Score Disclaimer: We use the NRS-11 scale. This is a self-reported, subjective measurement of pain severity with only modest accuracy. It is used primarily to identify changes within a particular patient. It must be understood that outpatient pain scales are significantly less accurate that those used for research, where they can be applied under ideal controlled circumstances with minimal exposure to variables. In reality, the score is likely to be a combination of pain intensity and pain affect, where pain affect describes the degree of emotional arousal or changes in action readiness caused by the sensory experience of pain. Factors such as social and work situation, setting, emotional state, anxiety levels, expectation, and prior pain experience may influence pain perception and show large inter-individual differences that may also be affected by time variables.  Patient instructions provided during this appointment: Patient Instructions    BMI Assessment: Estimated body mass index is 38.97 kg/m as calculated from the following:   Height as of this encounter: '5\' 3"'$  (1.6 m).   Weight as of this encounter: 220 lb (99.8 kg).  BMI interpretation table: BMI level Category Range association with higher incidence of chronic pain  <18 kg/m2 Underweight   18.5-24.9 kg/m2  Ideal body weight   25-29.9 kg/m2 Overweight Increased incidence by 20%  30-34.9 kg/m2 Obese (Class I) Increased incidence by 68%  35-39.9 kg/m2 Severe obesity (Class II) Increased incidence by 136%  >40 kg/m2 Extreme obesity (Class III) Increased incidence by 254%   Patient's current BMI Ideal Body weight  Body mass index is 38.97 kg/m. Ideal body weight: 52.4 kg (115 lb 8.3 oz) Adjusted ideal body weight: 71.4 kg (157 lb 5 oz)  BMI Readings from Last 4 Encounters:  10/28/17 38.97 kg/m  08/07/17 38.79 kg/m  06/26/17 38.62 kg/m  05/05/17 38.62 kg/m   Wt Readings from Last 4 Encounters:  10/28/17 220 lb (99.8 kg)  08/07/17 219 lb (99.3 kg)  06/26/17 218 lb (98.9 kg)  05/05/17 218 lb (98.9 kg)

## 2017-10-28 NOTE — Progress Notes (Signed)
Safety precautions to be maintained throughout the outpatient stay will include: orient to surroundings, keep bed in low position, maintain call bell within reach at all times, provide assistance with transfer out of bed and ambulation.  

## 2017-11-03 ENCOUNTER — Ambulatory Visit: Payer: Self-pay | Admitting: Surgery

## 2017-11-04 ENCOUNTER — Ambulatory Visit (INDEPENDENT_AMBULATORY_CARE_PROVIDER_SITE_OTHER): Payer: Medicare Other | Admitting: Family Medicine

## 2017-11-04 ENCOUNTER — Encounter: Payer: Self-pay | Admitting: Family Medicine

## 2017-11-04 VITALS — BP 132/78 | HR 91 | Temp 98.3°F | Resp 16 | Wt 222.0 lb

## 2017-11-04 DIAGNOSIS — N183 Chronic kidney disease, stage 3 unspecified: Secondary | ICD-10-CM

## 2017-11-04 DIAGNOSIS — I10 Essential (primary) hypertension: Secondary | ICD-10-CM | POA: Diagnosis not present

## 2017-11-04 DIAGNOSIS — B0222 Postherpetic trigeminal neuralgia: Secondary | ICD-10-CM

## 2017-11-04 DIAGNOSIS — B078 Other viral warts: Secondary | ICD-10-CM

## 2017-11-04 DIAGNOSIS — R0982 Postnasal drip: Secondary | ICD-10-CM | POA: Diagnosis not present

## 2017-11-04 DIAGNOSIS — E1122 Type 2 diabetes mellitus with diabetic chronic kidney disease: Secondary | ICD-10-CM | POA: Diagnosis not present

## 2017-11-04 LAB — POCT GLYCOSYLATED HEMOGLOBIN (HGB A1C)
Est. average glucose Bld gHb Est-mCnc: 140
HEMOGLOBIN A1C: 6.5 % — AB (ref 4.0–5.6)

## 2017-11-04 MED ORDER — FLUTICASONE PROPIONATE 50 MCG/ACT NA SUSP: 2.0000 | Freq: Every day | NASAL | 3 refills | Status: DC

## 2017-11-04 NOTE — Assessment & Plan Note (Signed)
Complains of significant postnasal drip Start Flonase

## 2017-11-04 NOTE — Progress Notes (Signed)
Patient: [name] Female    DOB: 10/17/1950   67 y.o.   MRN: [id number] Visit Date: 11/04/2017  Today's Provider: Lavon Paganini, MD   I, [name], CMA, am acting as scribe for Lavon Paganini, MD.  Chief Complaint  Patient presents with  . Hypertension  . Diabetes   Subjective:    HPI      Hypertension, follow-up:  BP Readings from Last 3 Encounters:  11/04/17 132/78  10/28/17 135/64  08/07/17 (!) 128/94    She was last seen for hypertension 3 months ago.  BP at that visit was 128/94. Management since that visit includes continuing to hold HCTZ and increasing losartan to 100 mg po qd. She reports good compliance with treatment. She is not having side effects.  She is not exercising. She is parking further away to ensure she walks more. She is adherent to low salt diet.   Outside blood pressures are not being checked. She is experiencing fatigue.  Patient denies chest pain, chest pressure/discomfort, claudication, dyspnea, exertional chest pressure/discomfort, irregular heart beat, lower extremity edema, near-syncope, orthopnea, palpitations and syncope.   Cardiovascular risk factors include advanced age (older than 40 for men, 42 for women), diabetes mellitus, dyslipidemia, hypertension and obesity (BMI >= 30 kg/m2).  Use of agents associated with hypertension: none.     Weight trend: increasing steadily Wt Readings from Last 3 Encounters:  11/04/17 222 lb (100.7 kg)  10/28/17 220 lb (99.8 kg)  08/07/17 219 lb (99.3 kg)    Current diet: in general, an "unhealthy" diet  ------------------------------------------------------------------------  Diabetes Mellitus Type II, Follow-up:   Lab Results  Component Value Date   HGBA1C 6.4 06/26/2017   HGBA1C 6.4 02/24/2017   HGBA1C 6.6 (H) 12/02/2016    Last seen for diabetes 4 months ago.  Management since then includes none. She reports good compliance with treatment. She is not having  side effects.  Current symptoms include none and have been stable. Home blood sugar records: not being checked  Episodes of hypoglycemia? yes - once per week; pt states she thought this was a Gabapentin side effect.   Current Insulin Regimen: N/A Most Recent Eye Exam: pt is due  Pertinent Labs:    Component Value Date/Time   CHOL 182 03/26/2017 0905   CHOL 181 02/29/2016 0956   CHOL 210 (H) 11/03/2013 0921   TRIG 132 03/26/2017 0905   TRIG 97 11/03/2013 0921   HDL 46 (L) 03/26/2017 0905   HDL 48 02/29/2016 0956   HDL 50 11/03/2013 0921   LDLCALC 111 (H) 03/26/2017 0905   LDLCALC 141 (H) 11/03/2013 0921   CREATININE 0.96 03/26/2017 0905    ------------------------------------------------------------------------ Neuropathy Pt is currently taking carbamazepine, nortriptyline, and gabapentin for neuropathy. She is asking if it is safe to take all these medications together.  She has been seeing Neurology who started Tegretol in 07/2017.  She saw pain management and did not find this helpful.   Bump on L calf x3 months. Not painful, not draining, no fevers  Allergies  Allergen Reactions  . Lisinopril Cough  . Lovastatin Other (See Comments)    Myalgia  . Shellfish Allergy Rash     Current Outpatient Medications:  .  atorvastatin (LIPITOR) 40 MG tablet, Take 1 tablet (40 mg total) by mouth daily., Disp: 90 tablet, Rfl: 3 .  carbamazepine (TEGRETOL) 200 MG tablet, Take 200 mg by mouth 2 (two) times daily., Disp: , Rfl:  .  Carboxymethylcellulose  Sodium (LUBRICANT EYE DROPS OP), Place 1 drop into both eyes 4 (four) times daily as needed (FOR DRY EYES)., Disp: , Rfl:  .  gabapentin (NEURONTIN) 600 MG tablet, Take 1.5 tablets (900 mg total) by mouth every 6 (six) hours., Disp: 540 tablet, Rfl: 3 .  losartan (COZAAR) 100 MG tablet, Take 1 tablet (100 mg total) by mouth daily., Disp: 90 tablet, Rfl: 3 .  nortriptyline (PAMELOR) 75 MG capsule, Take 1 capsule (75 mg total) by mouth at  bedtime., Disp: 90 capsule, Rfl: 3 .  albuterol (PROVENTIL HFA;VENTOLIN HFA) 108 (90 Base) MCG/ACT inhaler, Inhale 2 puffs into the lungs every 4 (four) hours as needed for wheezing or shortness of breath. (Patient not taking: Reported on 11/04/2017), Disp: 1 Inhaler, Rfl: 0 .  NONFORMULARY OR COMPOUNDED ITEM, Compounded cream: 6% Gabapentin, 2.5% Lidocaine, 0.5% Meloxicam, 5% Methocarbamol, 2.5% Prilocaine. Sig: Apply 1-2 gm(s) (2-4 pumps) to affected area, 3-4 times/day. (1 pump = 0.5 gm) Dispense: 120 gm Pump Bottle. Dispenser: 1 pump = 0.5 gm. (Patient not taking: Reported on 11/04/2017), Disp: 1 each, Rfl: 1  Review of Systems  Constitutional: Positive for fatigue. Negative for activity change, appetite change, chills, diaphoresis, fever and unexpected weight change.  HENT: Negative.   Eyes: Negative for visual disturbance.  Respiratory: Negative.  Negative for shortness of breath.   Cardiovascular: Negative for chest pain, palpitations and leg swelling.  Endocrine: Negative for polydipsia, polyphagia and polyuria.  Genitourinary: Negative.   Allergic/Immunologic: Negative.   Neurological: Negative.   Hematological: Negative.   Psychiatric/Behavioral: Negative.     Social History   Tobacco Use  . Smoking status: Never Smoker  . Smokeless tobacco: Never Used  Substance Use Topics  . Alcohol use: No    Alcohol/week: 0.0 oz   Objective:   BP 132/78 (BP Location: Left Arm, Patient Position: Sitting, Cuff Size: Large)   Pulse 91   Temp 98.3 F (36.8 C) (Oral)   Resp 16   Wt 222 lb (100.7 kg)   SpO2 94%   BMI 39.33 kg/m  Vitals:   11/04/17 0823  BP: 132/78  Pulse: 91  Resp: 16  Temp: 98.3 F (36.8 C)  TempSrc: Oral  SpO2: 94%  Weight: 222 lb (100.7 kg)     Physical Exam  Constitutional: She is oriented to person, place, and time. She appears well-developed and well-nourished. No distress.  HENT:  Head: Normocephalic and atraumatic.  Eyes: Pupils are equal, round,  and reactive to light. Conjunctivae are normal. No scleral icterus.  Neck: Neck supple. No thyromegaly present.  Cardiovascular: Normal rate, regular rhythm, normal heart sounds and intact distal pulses.  No murmur heard. Pulmonary/Chest: Effort normal and breath sounds normal. No respiratory distress. She has no wheezes. She has no rales.  Musculoskeletal: She exhibits no edema.  Lymphadenopathy:    She has no cervical adenopathy.  Neurological: She is alert and oriented to person, place, and time.  Skin: Skin is warm and dry. Capillary refill takes less than 2 seconds.  Small wart on posterior L calf  Psychiatric: She has a normal mood and affect. Her behavior is normal.  Vitals reviewed.      Results for orders placed or performed in visit on 11/04/17  POCT glycosylated hemoglobin (Hb A1C)  Result Value Ref Range   Hemoglobin A1C 6.5 (A) 4.0 - 5.6 %   Est. average glucose Bld gHb Est-mCnc 140     Assessment & Plan:   Problem List Items Addressed This Visit  Cardiovascular and Mediastinum   Hypertension    Well-controlled Continue current medications Recent labs reviewed Follow-up at CPE        Endocrine   Diabetes ([place]) - Primary    Well-controlled with diet and no medications Advised on diet and exercise Advised on scheduling annual eye exam-patient to call Up-to-date on foot exam and vaccinations      Relevant Orders   POCT glycosylated hemoglobin (Hb A1C) (Completed)     Other   Neuropathic postherpetic trigeminal neuralgia (Location of Primary Source of Pain) (Right) (V1)    Somewhat improving Following with neurology Appears she was supposed to be seen earlier this month, so advised her to call and schedule follow-up appointment Continue current medications including gabapentin, nortriptyline, Tegretol It appears that Dr. Manuella Ghazi wanted to decrease her gabapentin if she is doing well on her Tegretol at her follow-up appointment She does not need to keep  seeing pain management if she feels this is not helpful as neurology is managing her medications for this She was advised to get a Shingrix vaccination, so we will put her on the wait list and she will check with her insurance      Postnasal drip    Complains of significant postnasal drip Start Flonase      Other viral warts    As this is small and not bothersome at this time, we will leave it alone Monitor for any growth in size or if it becomes painful          Return in about 5 months (around 04/06/2018) for AWV/CPE.   The entirety of the information documented in the History of Present Illness, Review of Systems and Physical Exam were personally obtained by me. Portions of this information were initially documented by [name], CMA and reviewed by me for thoroughness and accuracy.    Sarah Crews, MD, MPH [facility] 11/04/2017 10:09 AM

## 2017-11-04 NOTE — Assessment & Plan Note (Signed)
As this is small and not bothersome at this time, we will leave it alone Monitor for any growth in size or if it becomes painful

## 2017-11-04 NOTE — Patient Instructions (Signed)
   The CDC recommends two doses of Shingrix (the shingles vaccine) separated by 2 to 6 months for adults age 69 years and older. I recommend checking with your insurance plan regarding coverage for this vaccine.   

## 2017-11-04 NOTE — Assessment & Plan Note (Signed)
Well-controlled with diet and no medications Advised on diet and exercise Advised on scheduling annual eye exam-patient to call Up-to-date on foot exam and vaccinations

## 2017-11-04 NOTE — Assessment & Plan Note (Signed)
Well-controlled Continue current medications Recent labs reviewed Follow-up at CPE

## 2017-11-04 NOTE — Assessment & Plan Note (Signed)
Somewhat improving Following with neurology Appears she was supposed to be seen earlier this month, so advised her to call and schedule follow-up appointment Continue current medications including gabapentin, nortriptyline, Tegretol It appears that Dr. Manuella Ghazi wanted to decrease her gabapentin if she is doing well on her Tegretol at her follow-up appointment She does not need to keep seeing pain management if she feels this is not helpful as neurology is managing her medications for this She was advised to get a Shingrix vaccination, so we will put her on the wait list and she will check with her insurance

## 2017-11-10 ENCOUNTER — Ambulatory Visit (INDEPENDENT_AMBULATORY_CARE_PROVIDER_SITE_OTHER): Payer: Medicare Other | Admitting: Surgery

## 2017-11-10 ENCOUNTER — Encounter: Payer: Self-pay | Admitting: Surgery

## 2017-11-10 VITALS — BP 163/102 | HR 111 | Temp 97.7°F | Wt 225.0 lb

## 2017-11-10 DIAGNOSIS — D241 Benign neoplasm of right breast: Secondary | ICD-10-CM | POA: Diagnosis not present

## 2017-11-10 NOTE — Patient Instructions (Signed)
Please call us in case you have any questions or concerns.  We will contact you in a year for your yearly screening mammogram and follow up.

## 2017-11-11 ENCOUNTER — Encounter: Payer: Self-pay | Admitting: Surgery

## 2017-11-11 NOTE — Progress Notes (Signed)
[facility] Clinic Progress/Follow-up Note   HPI:  67 y.o. Female presents to clinic for follow-up evaluation and to discuss results of recent mammogram just under 2 years s/p Right breast lumpectomy performed for atypical hyperplasia on core needle biopsy at that time with benign final pathology following multiple prior benign core needle biopsies. Patient reports she's been feeling well, denies any breast mass, pain, nipple discharge, fever/chills, unintentional weight loss, CP, or SOB.  Review of Systems:  Constitutional: denies any other weight loss, fever, chills, or sweats  Eyes: denies any other vision changes, history of eye injury  ENT: denies sore throat, hearing problems  Respiratory: denies shortness of breath, wheezing  Cardiovascular: denies chest pain, palpitations  Breasts: no masses, pain, and nipple discharge appreciated as per HPI Gastrointestinal: denies abdominal pain, N/V, or diarrhea Musculoskeletal: denies any other joint pains or cramps  Skin: Denies any other rashes or skin discolorations  Neurological: denies any other headache, dizziness, weakness  Psychiatric: denies any other depression, anxiety  All other review of systems: otherwise negative   Vital Signs:  BP (!) 163/102   Pulse (!) 111   Temp 97.7 F (36.5 C) (Oral)   Wt 225 lb (102.1 kg)   BMI 39.86 kg/m    Physical Exam:  Constitutional:  -- Obese body habitus  -- Awake, alert, and oriented x3  Eyes:  -- Pupils equally round and reactive to light  -- No scleral icterus  Ear, nose, throat:  -- No jugular venous distension  -- No nasal drainage, bleeding Pulmonary:  -- No crackles -- Equal breath sounds bilaterally -- Breathing non-labored at rest Cardiovascular:  -- S1, S2 present  -- No pericardial rubs Breasts: -- B/L breasts non-tender with no palpable masses, nipple discharge, or axillary lymphadenopathy -- Former Right breast lumpectomy site well-healed without surrounding  erythema Gastrointestinal:  -- Soft, nontender, non-distended, no guarding/rebound  -- No abdominal masses appreciated, pulsatile or otherwise  Musculoskeletal / Integumentary:  -- Wounds or skin discoloration: None appreciated  -- Extremities: B/L UE and LE FROM, hands and feet warm, no edema  Neurologic:  -- Motor function: intact and symmetric  -- Sensation: intact and symmetric   Laboratory studies:  CBC:  Lab Results  Component Value Date   WBC 10.8 01/15/2017   WBC 14.7 (H) 12/03/2016   RBC 4.95 01/15/2017   RBC 4.25 12/03/2016   BMP:  Lab Results  Component Value Date   GLUCOSE 96 03/26/2017   GLUCOSE 99 11/03/2013   CO2 31 03/26/2017   CO2 28 11/03/2013   BUN 13 03/26/2017   BUN 8 01/15/2017   BUN 13 11/03/2013   CREATININE 0.96 03/26/2017   CALCIUM 9.3 03/26/2017   CALCIUM 9.0 11/03/2013     Imaging:  Bilateral Screening Mammogram (10/22/2017) ACR Breast Density Category b: There are scattered areas of fibroglandular density. There are no findings suspicious for malignancy.  Radiologist recommends screening mammogram in one year. BI-RADS CATEGORY 1: Negative.  Assessment:  67 y.o. yo Female with a problem list including...  Patient Active Problem List   Diagnosis Date Noted  . Postnasal drip 11/04/2017  . Other viral warts 11/04/2017  . Acute pain of both knees 08/07/2017  . Microalbuminuria 03/26/2017  . Primary osteoarthritis involving multiple joints 12/05/2016  . Status post total left knee replacement 12/01/2016  . Primary osteoarthritis of left knee 09/07/2016  . Hypertension 07/24/2016  . Occipital headache 01/21/2016  . Bilateral occipital neuralgia 01/21/2016  . Chronic pain syndrome 05/07/2015  .  Gasserian ganglionitis 03/12/2015  . Neuropathic pain 03/12/2015  . Herpes zoster ophthalmicus 03/12/2015  . Anxiety 01/11/2015  . Diabetes ([place]) 01/11/2015  . Photopsia 01/11/2015  . Avitaminosis A 01/11/2015  . Depression 01/10/2015  .  Obesity (BMI 35.0-39.9 without comorbidity) ([place]) 04/18/2014  . Neuropathic postherpetic trigeminal neuralgia (Location of Primary Source of Pain) (Right) (V1) 12/21/2013  . Cannot sleep 03/13/2009  . Asthma 02/07/2009  . Calcium deficiency disease 10/16/2008  . Hypercholesterolemia without hypertriglyceridemia 07/06/2008  . Avitaminosis D 07/05/2008  . History of colon polyps 10/17/2005    presents to clinic for follow-up evaluation and discussion regarding results of recent B/L screening mammogram, doing well just under 2 years s/p Right breast lumpectomy Adonis Huguenin, 05/02/2018) for atypical hyperplasia identified on preceding core needle biopsy following multiple prior benign biopies.  Plan:   - results of recent mammogram discussed with patient  - will schedule routine screening mammogram for in 1 year  - return to clinic following mammogram for exam and to discuss results  - instructed to call office if any questions or concerns arise  All of the above recommendations were discussed with the patient, and all of patient's questions were answered to her expressed satisfaction.  -- Marilynne Drivers Rosana Hoes, MD, [place]: [facility] - Partnering for exceptional care. Office: [phone]

## 2017-11-20 DIAGNOSIS — Z79899 Other long term (current) drug therapy: Secondary | ICD-10-CM | POA: Diagnosis not present

## 2017-11-20 DIAGNOSIS — E538 Deficiency of other specified B group vitamins: Secondary | ICD-10-CM | POA: Diagnosis not present

## 2017-11-20 DIAGNOSIS — B0222 Postherpetic trigeminal neuralgia: Secondary | ICD-10-CM | POA: Diagnosis not present

## 2017-11-20 DIAGNOSIS — B0229 Other postherpetic nervous system involvement: Secondary | ICD-10-CM | POA: Diagnosis not present

## 2017-11-20 DIAGNOSIS — E559 Vitamin D deficiency, unspecified: Secondary | ICD-10-CM | POA: Diagnosis not present

## 2018-01-07 DIAGNOSIS — Z96653 Presence of artificial knee joint, bilateral: Secondary | ICD-10-CM | POA: Diagnosis not present

## 2018-01-07 DIAGNOSIS — Z6841 Body Mass Index (BMI) 40.0 and over, adult: Secondary | ICD-10-CM | POA: Diagnosis not present

## 2018-01-29 ENCOUNTER — Encounter: Payer: Self-pay | Admitting: Family Medicine

## 2018-01-29 ENCOUNTER — Encounter: Payer: Self-pay | Admitting: *Deleted

## 2018-01-29 ENCOUNTER — Ambulatory Visit (INDEPENDENT_AMBULATORY_CARE_PROVIDER_SITE_OTHER): Payer: Medicare Other | Admitting: Family Medicine

## 2018-01-29 VITALS — BP 154/104 | HR 84 | Temp 98.1°F | Resp 16 | Wt 229.0 lb

## 2018-01-29 DIAGNOSIS — I10 Essential (primary) hypertension: Secondary | ICD-10-CM

## 2018-01-29 DIAGNOSIS — K6289 Other specified diseases of anus and rectum: Secondary | ICD-10-CM | POA: Diagnosis not present

## 2018-01-29 DIAGNOSIS — E1100 Type 2 diabetes mellitus with hyperosmolarity without nonketotic hyperglycemic-hyperosmolar coma (NKHHC): Secondary | ICD-10-CM

## 2018-01-29 DIAGNOSIS — R194 Change in bowel habit: Secondary | ICD-10-CM | POA: Diagnosis not present

## 2018-01-29 DIAGNOSIS — R195 Other fecal abnormalities: Secondary | ICD-10-CM

## 2018-01-29 MED ORDER — NORTRIPTYLINE HCL 75 MG PO CAPS: 75.0000 mg | ORAL_CAPSULE | Freq: Every day | ORAL | 3 refills | Status: DC

## 2018-01-29 MED ORDER — HYDROCORTISONE 2.5 % RE CREA: 1.0000 "application " | TOPICAL_CREAM | Freq: Two times a day (BID) | RECTAL | 0 refills | Status: DC

## 2018-01-29 MED ORDER — LOSARTAN POTASSIUM 100 MG PO TABS: 100.0000 mg | ORAL_TABLET | Freq: Every day | ORAL | 3 refills | Status: DC

## 2018-01-29 MED ORDER — HYDROCHLOROTHIAZIDE 12.5 MG PO TABS: 12.5000 mg | ORAL_TABLET | Freq: Every day | ORAL | 3 refills | Status: DC

## 2018-01-29 MED ORDER — ATORVASTATIN CALCIUM 40 MG PO TABS
40.0000 mg | ORAL_TABLET | Freq: Every day | ORAL | 3 refills | Status: DC
Start: 2018-01-29 — End: 2018-09-16

## 2018-01-29 NOTE — Progress Notes (Signed)
Patient: [name] Female    DOB: 04-Jul-1950   67 y.o.   MRN: [id number] Visit Date: 01/29/2018  Today's Provider: Lavon Paganini, MD   I, Martha Clan, CMA, am acting as scribe for Lavon Paganini, MD.  Chief Complaint  Patient presents with  . Diarrhea   Subjective:    Diarrhea   This is a recurrent problem. Episode onset: x 1 year. The problem has been unchanged. Diarrhea characteristics: pt notices she has to "keep wiping", which causes rectal soreness. Associated symptoms include chills (due to recent URI), coughing (due to recent URI), increased flatus and a URI. Pertinent negatives include no abdominal pain, arthralgias, bloating, fever, headaches, sweats, vomiting or weight loss. Exacerbated by: pt has been paying to attention to foods, and notices that popcorn seems t omake diarrhea worse. She has tried nothing for the symptoms.  Pt is concerned that she has a bowel disease. She is having 1 bowel movement per day.  She previously was more constipated.  She would also like to discuss BP. These have been elevated recently, and she believes she needs different BP medications. She denies associated HTN sx. she states home blood pressures are also elevated.  She was previously on lisinopril and developed cough, so was switched to losartan.  It appears back in 2018 she was also taking HCTZ.  She reports taking her losartan with good compliance.  She does not remember why her HCTZ was stopped.  BP Readings from Last 3 Encounters:  01/29/18 (!) 154/104  11/10/17 (!) 163/102  11/04/17 132/78      Allergies  Allergen Reactions  . Lisinopril Cough  . Lovastatin Other (See Comments)    Myalgia  . Shellfish Allergy Rash     Current Outpatient Medications:  .  albuterol (PROVENTIL HFA;VENTOLIN HFA) 108 (90 Base) MCG/ACT inhaler, Inhale 2 puffs into the lungs every 4 (four) hours as needed for wheezing or shortness of breath., Disp: 1 Inhaler, Rfl: 0 .   atorvastatin (LIPITOR) 40 MG tablet, Take 1 tablet (40 mg total) by mouth daily., Disp: 90 tablet, Rfl: 3 .  Carboxymethylcellulose Sodium (LUBRICANT EYE DROPS OP), Place 1 drop into both eyes 4 (four) times daily as needed (FOR DRY EYES)., Disp: , Rfl:  .  fluticasone (FLONASE) 50 MCG/ACT nasal spray, Place 2 sprays into both nostrils daily., Disp: 48 g, Rfl: 3 .  gabapentin (NEURONTIN) 600 MG tablet, Take 1.5 tablets (900 mg total) by mouth every 6 (six) hours., Disp: 540 tablet, Rfl: 3 .  losartan (COZAAR) 100 MG tablet, Take 1 tablet (100 mg total) by mouth daily., Disp: 90 tablet, Rfl: 3 .  nortriptyline (PAMELOR) 75 MG capsule, Take 1 capsule (75 mg total) by mouth at bedtime., Disp: 90 capsule, Rfl: 3  Review of Systems  Constitutional: Positive for chills (due to recent URI). Negative for fatigue, fever and weight loss.  Respiratory: Positive for cough (due to recent URI). Negative for shortness of breath.   Cardiovascular: Negative for chest pain, palpitations and leg swelling.  Gastrointestinal: Positive for diarrhea and flatus. Negative for abdominal pain, bloating and vomiting.  Musculoskeletal: Negative for arthralgias.  Neurological: Negative for headaches.    Social History   Tobacco Use  . Smoking status: Never Smoker  . Smokeless tobacco: Never Used  Substance Use Topics  . Alcohol use: No    Alcohol/week: 0.0 standard drinks   Objective:   BP (!) 154/104 (BP Location: Left Arm, Patient Position: Sitting, Cuff Size:  Large)   Pulse 84   Temp 98.1 F (36.7 C) (Oral)   Resp 16   Wt 229 lb (103.9 kg)   SpO2 99%   BMI 40.57 kg/m  Vitals:   01/29/18 0844  BP: (!) 154/104  Pulse: 84  Resp: 16  Temp: 98.1 F (36.7 C)  TempSrc: Oral  SpO2: 99%  Weight: 229 lb (103.9 kg)     Physical Exam  Constitutional: She is oriented to person, place, and time. She appears well-developed and well-nourished. No distress.  HENT:  Head: Normocephalic and atraumatic.    Mouth/Throat: Oropharynx is clear and moist.  Eyes: Conjunctivae are normal. No scleral icterus.  Neck: Neck supple. No thyromegaly present.  Cardiovascular: Normal rate, regular rhythm, normal heart sounds and intact distal pulses.  No murmur heard. Pulmonary/Chest: Effort normal and breath sounds normal. No respiratory distress. She has no wheezes. She has no rales.  Abdominal: Soft. Bowel sounds are normal. She exhibits no distension. There is no tenderness.  Musculoskeletal: She exhibits no edema.  Lymphadenopathy:    She has no cervical adenopathy.  Neurological: She is alert and oriented to person, place, and time.  Skin: Skin is warm and dry. Capillary refill takes less than 2 seconds. No rash noted.  Psychiatric: She has a normal mood and affect. Her behavior is normal.  Vitals reviewed.   Diabetic Foot Exam - Simple   Simple Foot Form Diabetic Foot exam was performed with the following findings:  Yes 01/29/2018  9:35 AM  Visual Inspection See comments:  Yes Sensation Testing Intact to touch and monofilament testing bilaterally:  Yes Pulse Check Posterior Tibialis and Dorsalis pulse intact bilaterally:  Yes Comments Callus of base of R great toe, no ulceration or skin breakdown.        Assessment & Plan:   1. Essential hypertension -Uncontrolled -Discussed that losartan is at max dose, so we will continue this as ordered -Resume HCTZ at 12.5 mg daily -At upcoming physical, we will reevaluate her blood pressure and check labs again next line-discussed return precautions  2. Change in bowel habits 3. Rectal irritation 4. Loose stools -Patient does not have true diarrhea as frequency has not increased -She does have loose stools which is a change in bowel habits for her -Discussed importance of fiber in her diet -She is up-to-date on screening colonoscopy, but I did advise seeing gastroenterology given her recent change in bowel movements -Discussed that it is  reassuring that she does not have abdominal pain, fevers, or melena/hematochezia - Her rectal irritation is due to frequent loose stools, so we will provide hydrocortisone cream to help with this -Also discussed barrier creams, which hazel pads, and wet wipes -If loose stools resolve with increasing fiber in her diet, she can cancel her gastroenterology appointment - Ambulatory referral to Gastroenterology  5. Type 2 diabetes mellitus with hyperosmolarity without coma, without long-term current use of insulin ([place]) - Foot exam completed today    Meds ordered this encounter  Medications  . hydrochlorothiazide (HYDRODIURIL) 12.5 MG tablet    Sig: Take 1 tablet (12.5 mg total) by mouth daily.    Dispense:  90 tablet    Refill:  3  . nortriptyline (PAMELOR) 75 MG capsule    Sig: Take 1 capsule (75 mg total) by mouth at bedtime.    Dispense:  90 capsule    Refill:  3  . losartan (COZAAR) 100 MG tablet    Sig: Take 1 tablet (100 mg total) by  mouth daily.    Dispense:  90 tablet    Refill:  3  . hydrocortisone (ANUSOL-HC) 2.5 % rectal cream    Sig: Place 1 application rectally 2 (two) times daily.    Dispense:  90 g    Refill:  0     Return if symptoms worsen or fail to improve.   The entirety of the information documented in the History of Present Illness, Review of Systems and Physical Exam were personally obtained by me. Portions of this information were initially documented by Raquel Sarna Ratchford, CMA and reviewed by me for thoroughness and accuracy.    Virginia Crews, MD, MPH [facility] 01/29/2018 9:36 AM

## 2018-01-29 NOTE — Patient Instructions (Signed)
High-Fiber Diet  Fiber, also called dietary fiber, is a type of carbohydrate found in fruits, vegetables, whole grains, and beans. A high-fiber diet can have many health benefits. Your health care provider may recommend a high-fiber diet to help:  · Prevent constipation. Fiber can make your bowel movements more regular.  · Lower your cholesterol.  · Relieve hemorrhoids, uncomplicated diverticulosis, or irritable bowel syndrome.  · Prevent overeating as part of a weight-loss plan.  · Prevent heart disease, type 2 diabetes, and certain cancers.    What is my plan?  The recommended daily intake of fiber includes:  · 38 grams for men under age 50.  · 30 grams for men over age 50.  · 25 grams for women under age 50.  · 21 grams for women over age 50.    You can get the recommended daily intake of dietary fiber by eating a variety of fruits, vegetables, grains, and beans. Your health care provider may also recommend a fiber supplement if it is not possible to get enough fiber through your diet.  What do I need to know about a high-fiber diet?  · Fiber supplements have not been widely studied for their effectiveness, so it is better to get fiber through food sources.  · Always check the fiber content on the nutrition facts label of any prepackaged food. Look for foods that contain at least 5 grams of fiber per serving.  · Ask your dietitian if you have questions about specific foods that are related to your condition, especially if those foods are not listed in the following section.  · Increase your daily fiber consumption gradually. Increasing your intake of dietary fiber too quickly may cause bloating, cramping, or gas.  · Drink plenty of water. Water helps you to digest fiber.  What foods can I eat?  Grains  Whole-grain breads. Multigrain cereal. Oats and oatmeal. Brown rice. Barley. Bulgur wheat. Millet. Bran muffins. Popcorn. Rye wafer crackers.  Vegetables   Sweet potatoes. Spinach. Kale. Artichokes. Cabbage. Broccoli. Green peas. Carrots. Squash.  Fruits  Berries. Pears. Apples. Oranges. Avocados. Prunes and raisins. Dried figs.  Meats and Other Protein Sources  [place]y, kidney, pinto, and soy beans. Split peas. Lentils. Nuts and seeds.  Dairy  Fiber-fortified yogurt.  Beverages  Fiber-fortified soy milk. Fiber-fortified orange juice.  Other  Fiber bars.  The items listed above may not be a complete list of recommended foods or beverages. Contact your dietitian for more options.  What foods are not recommended?  Grains  White bread. Pasta made with refined flour. White rice.  Vegetables  Fried potatoes. Canned vegetables. Well-cooked vegetables.  Fruits  Fruit juice. Cooked, strained fruit.  Meats and Other Protein Sources  Fatty cuts of meat. Fried poultry or fried fish.  Dairy  Milk. Yogurt. Cream cheese. Sour cream.  Beverages  Soft drinks.  Other  Cakes and pastries. Butter and oils.  The items listed above may not be a complete list of foods and beverages to avoid. Contact your dietitian for more information.  What are some tips for including high-fiber foods in my diet?  · Eat a wide variety of high-fiber foods.  · Make sure that half of all grains consumed each day are whole grains.  · Replace breads and cereals made from refined flour or white flour with whole-grain breads and cereals.  · Replace white rice with brown rice, bulgur wheat, or millet.  · Start the day with a breakfast that is high in fiber,   such as a cereal that contains at least 5 grams of fiber per serving.  · Use beans in place of meat in soups, salads, or pasta.  · Eat high-fiber snacks, such as berries, raw vegetables, nuts, or popcorn.  This information is not intended to replace advice given to you by your health care provider. Make sure you discuss any questions you have with your health care provider.  Document Released: 06/02/2005 Document Revised: 11/08/2015 Document Reviewed: 11/15/2013   Elsevier Interactive Patient Education © 2018 Elsevier Inc.

## 2018-02-12 ENCOUNTER — Telehealth: Payer: Self-pay | Admitting: Nurse Practitioner

## 2018-02-12 NOTE — Telephone Encounter (Signed)
Attempted to call Crystal to get clarification about prescription.  Unable to reach her.  Voicemail box is full.

## 2018-02-12 NOTE — Telephone Encounter (Signed)
Louie Casa at [facility] [phone] called regarding script for pain cream, they are unable to get Prilacaine. can they leave it out of cream and see how it works for patient? Please call and let them know

## 2018-02-16 NOTE — Telephone Encounter (Signed)
[name] at [name] notified that it would be ok to omit the Prilacaine per Corky Downs NP

## 2018-02-19 DIAGNOSIS — E119 Type 2 diabetes mellitus without complications: Secondary | ICD-10-CM | POA: Diagnosis not present

## 2018-02-19 LAB — HM DIABETES EYE EXAM

## 2018-03-12 ENCOUNTER — Encounter (INDEPENDENT_AMBULATORY_CARE_PROVIDER_SITE_OTHER): Payer: Self-pay

## 2018-03-12 ENCOUNTER — Encounter: Payer: Self-pay | Admitting: Gastroenterology

## 2018-03-12 ENCOUNTER — Ambulatory Visit (INDEPENDENT_AMBULATORY_CARE_PROVIDER_SITE_OTHER): Payer: Medicare Other | Admitting: Gastroenterology

## 2018-03-12 ENCOUNTER — Other Ambulatory Visit: Payer: Self-pay

## 2018-03-12 VITALS — BP 126/85 | HR 98 | Resp 18 | Wt 225.4 lb

## 2018-03-12 DIAGNOSIS — K582 Mixed irritable bowel syndrome: Secondary | ICD-10-CM | POA: Diagnosis not present

## 2018-03-12 NOTE — Progress Notes (Signed)
Cephas Darby, MD [address]  [place]  [place], [place] [address]  Main: [phone]  Fax: [phone]    Gastroenterology Consultation  Referring Provider:     Virginia Crews, MD Primary Care Physician:  Virginia Crews, MD Primary Gastroenterologist:  Dr. Sherri Sear Reason for Consultation:     Altered bowel habits        HPI:   [name] is a 67 y.o. female referred by Dr. Brita Romp, Dionne Bucy, MD  for consultation & management of approximately 1 year history of alternating episodes of constipation and loose stools.  She reports that her bowel frequency can go anywhere from once a week that lasts a few weeks to 1-2 loose bowel movements per day.  Currently she is going to the episode of constipation.  For the last 1 month, her loose stools have resolved.  During these episodes, she reports severe perianal burning, itching due to frequent wiping.  She tried witch hazel, Preparation H and hydrocortisone cream which provided some relief.  She is trying to incorporate more fiber in her diet.  She also reports bloating and abdominal pain when her constipation is worse She drinks sodas daily She reports that it is hard for her to lose weight although she is not consuming high-calorie foods  NSAIDs: None  Antiplts/Anticoagulants/Anti thrombotics: None  GI Procedures: Colonoscopy by Dr. Allen Norris in 2010, reportedly normal  Past Medical History:  Diagnosis Date  . Anxiety   . Arthritis   . Asthma   . Bronchitis 09/04/2016   urgent care  . Diabetes mellitus without complication ([place])   . Elevated C-reactive protein (CRP) 10/06/2016  . GERD (gastroesophageal reflux disease)   . History of shingles   . Hyperlipidemia   . Hypertension   . Neuralgia    Right side of head and face  . Shingles    chronic on Rt side of head and face    Past Surgical History:  Procedure Laterality Date  . ABDOMINAL HYSTERECTOMY    . BREAST BIOPSY Right 2016   benign-  tophat clip  . BREAST BIOPSY Left 04/08/2016   -benign-fibroadenomatous changes- tophat clip  . BREAST LUMPECTOMY Right 03/2016  . BREAST LUMPECTOMY WITH NEEDLE LOCALIZATION Right 05/02/2016   Procedure: BREAST LUMPECTOMY WITH NEEDLE LOCALIZATION;  Surgeon: Clayburn Pert, MD;  Location: [facility] ORS;  Service: General;  Laterality: Right;  . CHOLECYSTECTOMY    . KNEE ARTHROPLASTY Left 12/01/2016   Procedure: COMPUTER ASSISTED TOTAL KNEE ARTHROPLASTY;  Surgeon: Dereck Leep, MD;  Location: [facility] ORS;  Service: Orthopedics;  Laterality: Left;  . TOTAL KNEE ARTHROPLASTY Right 2014  . TOTAL KNEE ARTHROPLASTY Right 02/16/2013    Current Outpatient Medications:  .  atorvastatin (LIPITOR) 40 MG tablet, Take 1 tablet (40 mg total) by mouth daily., Disp: 90 tablet, Rfl: 3 .  carbamazepine (TEGRETOL) 200 MG tablet, Take by mouth., Disp: , Rfl:  .  Carboxymethylcellulose Sod PF (REFRESH PLUS) 0.5 % SOLN, Apply to eye., Disp: , Rfl:  .  Carboxymethylcellulose Sodium (LUBRICANT EYE DROPS OP), Place 1 drop into both eyes 4 (four) times daily as needed (FOR DRY EYES)., Disp: , Rfl:  .  diazepam (VALIUM) 5 MG tablet, TAKE 1 TABLET BY MOUTH IN THE EVENING BEFORE GOING TO BED AND 1 T...  (REFER TO PRESCRIPTION NOTES)., Disp: , Rfl:  .  fluticasone (FLONASE) 50 MCG/ACT nasal spray, Place 2 sprays into both nostrils daily., Disp: 48 g, Rfl: 3 .  fluticasone  furoate-vilanterol (BREO ELLIPTA) 200-25 MCG/INH AEPB, , Disp: , Rfl:  .  hydrochlorothiazide (HYDRODIURIL) 12.5 MG tablet, Take 1 tablet (12.5 mg total) by mouth daily., Disp: 90 tablet, Rfl: 3 .  hydrocortisone (ANUSOL-HC) 2.5 % rectal cream, Place 1 application rectally 2 (two) times daily., Disp: 90 g, Rfl: 0 .  losartan (COZAAR) 100 MG tablet, Take 1 tablet (100 mg total) by mouth daily., Disp: 90 tablet, Rfl: 3 .  nortriptyline (PAMELOR) 75 MG capsule, Take 1 capsule (75 mg total) by mouth at bedtime., Disp: 90 capsule, Rfl: 3 .  Vitamin D,  Ergocalciferol, (DRISDOL) 50000 units CAPS capsule, Take by mouth., Disp: , Rfl:  .  albuterol (PROVENTIL HFA;VENTOLIN HFA) 108 (90 Base) MCG/ACT inhaler, Inhale 2 puffs into the lungs every 4 (four) hours as needed for wheezing or shortness of breath. (Patient not taking: Reported on 03/12/2018), Disp: 1 Inhaler, Rfl: 0 .  Bepotastine Besilate (BEPREVE) 1.5 % SOLN, , Disp: , Rfl:  .  gabapentin (NEURONTIN) 600 MG tablet, Take 1.5 tablets (900 mg total) by mouth every 6 (six) hours., Disp: 540 tablet, Rfl: 3 .  lansoprazole (PREVACID) 30 MG capsule, , Disp: , Rfl:    Family History  Problem Relation Age of Onset  . Anuerysm Mother        Brain  . Dementia Mother   . Heart failure Father   . CAD Father   . Dementia Father   . Diabetes Father   . Lupus Sister   . Diabetes Brother   . Throat cancer Maternal Grandfather   . Breast cancer Neg Hx      Social History   Tobacco Use  . Smoking status: Never Smoker  . Smokeless tobacco: Never Used  Substance Use Topics  . Alcohol use: No    Alcohol/week: 0.0 standard drinks  . Drug use: No    Allergies as of 03/12/2018 - Review Complete 03/12/2018  Allergen Reaction Noted  . Lisinopril Cough 10/31/2014  . Lovastatin Other (See Comments) 10/31/2014  . Shellfish allergy Rash 04/02/2016    Review of Systems:    All systems reviewed and negative except where noted in HPI.   Physical Exam:  BP 126/85 (BP Location: Left Arm, Patient Position: Sitting, Cuff Size: Large)   Pulse 98   Resp 18   Wt 225 lb 6.4 oz (102.2 kg)   BMI 39.93 kg/m  No LMP recorded. Patient has had a hysterectomy.  General:   Alert,  Well-developed, well-nourished, pleasant and cooperative in NAD Head:  Normocephalic and atraumatic. Eyes:  Sclera clear, no icterus.   Conjunctiva pink. Ears:  Normal auditory acuity. Nose:  No deformity, discharge, or lesions. Mouth:  No deformity or lesions,oropharynx pink & moist. Neck:  Supple; no masses or  thyromegaly. Lungs:  Respirations even and unlabored.  Clear throughout to auscultation.   No wheezes, crackles, or rhonchi. No acute distress. Heart:  Regular rate and rhythm; no murmurs, clicks, rubs, or gallops. Abdomen:  Normal bowel sounds. Soft, non-tender and non-distended without masses, hepatosplenomegaly or hernias noted.  No guarding or rebound tenderness.   Rectal: Not performed Msk:  Symmetrical without gross deformities. Good, equal movement & strength bilaterally. Pulses:  Normal pulses noted. Extremities:  No clubbing or edema.  No cyanosis. Neurologic:  Alert and oriented x3;  grossly normal neurologically. Skin:  Intact without significant lesions or rashes. No jaundice. Lymph Nodes:  No significant cervical adenopathy. Psych:  Alert and cooperative. Normal mood and affect.  Imaging Studies: No  recent abdominal imaging to  Assessment and Plan:   LUCILLIA CORSON is a 67 y.o. African-American female with metabolic syndrome, BMI 39 with 1 year history of alternating episodes of diarrhea and constipation.  Her symptoms are consistent with irritable bowel syndrome-mixed type.  Currently, she is going through constipation phase  Recommend high-fiber diet, fiber supplements and stool softener daily Discontinue regular intake of carbonated beverages No further work-up at this time  Colonoscopy in 2020 for colon cancer screening  Follow up as needed   Cephas Darby, MD

## 2018-03-12 NOTE — Patient Instructions (Signed)
High-Fiber Diet  Fiber, also called dietary fiber, is a type of carbohydrate found in fruits, vegetables, whole grains, and beans. A high-fiber diet can have many health benefits. Your health care provider may recommend a high-fiber diet to help:  · Prevent constipation. Fiber can make your bowel movements more regular.  · Lower your cholesterol.  · Relieve hemorrhoids, uncomplicated diverticulosis, or irritable bowel syndrome.  · Prevent overeating as part of a weight-loss plan.  · Prevent heart disease, type 2 diabetes, and certain cancers.    What is my plan?  The recommended daily intake of fiber includes:  · 38 grams for men under age 50.  · 30 grams for men over age 50.  · 25 grams for women under age 50.  · 21 grams for women over age 50.    You can get the recommended daily intake of dietary fiber by eating a variety of fruits, vegetables, grains, and beans. Your health care provider may also recommend a fiber supplement if it is not possible to get enough fiber through your diet.  What do I need to know about a high-fiber diet?  · Fiber supplements have not been widely studied for their effectiveness, so it is better to get fiber through food sources.  · Always check the fiber content on the nutrition facts label of any prepackaged food. Look for foods that contain at least 5 grams of fiber per serving.  · Ask your dietitian if you have questions about specific foods that are related to your condition, especially if those foods are not listed in the following section.  · Increase your daily fiber consumption gradually. Increasing your intake of dietary fiber too quickly may cause bloating, cramping, or gas.  · Drink plenty of water. Water helps you to digest fiber.  What foods can I eat?  Grains  Whole-grain breads. Multigrain cereal. Oats and oatmeal. Brown rice. Barley. Bulgur wheat. Millet. Bran muffins. Popcorn. Rye wafer crackers.  Vegetables   Sweet potatoes. Spinach. Kale. Artichokes. Cabbage. Broccoli. Green peas. Carrots. Squash.  Fruits  Berries. Pears. Apples. Oranges. Avocados. Prunes and raisins. Dried figs.  Meats and Other Protein Sources  [place]y, kidney, pinto, and soy beans. Split peas. Lentils. Nuts and seeds.  Dairy  Fiber-fortified yogurt.  Beverages  Fiber-fortified soy milk. Fiber-fortified orange juice.  Other  Fiber bars.  The items listed above may not be a complete list of recommended foods or beverages. Contact your dietitian for more options.  What foods are not recommended?  Grains  White bread. Pasta made with refined flour. White rice.  Vegetables  Fried potatoes. Canned vegetables. Well-cooked vegetables.  Fruits  Fruit juice. Cooked, strained fruit.  Meats and Other Protein Sources  Fatty cuts of meat. Fried poultry or fried fish.  Dairy  Milk. Yogurt. Cream cheese. Sour cream.  Beverages  Soft drinks.  Other  Cakes and pastries. Butter and oils.  The items listed above may not be a complete list of foods and beverages to avoid. Contact your dietitian for more information.  What are some tips for including high-fiber foods in my diet?  · Eat a wide variety of high-fiber foods.  · Make sure that half of all grains consumed each day are whole grains.  · Replace breads and cereals made from refined flour or white flour with whole-grain breads and cereals.  · Replace white rice with brown rice, bulgur wheat, or millet.  · Start the day with a breakfast that is high in fiber,   such as a cereal that contains at least 5 grams of fiber per serving.  · Use beans in place of meat in soups, salads, or pasta.  · Eat high-fiber snacks, such as berries, raw vegetables, nuts, or popcorn.  This information is not intended to replace advice given to you by your health care provider. Make sure you discuss any questions you have with your health care provider.  Document Released: 06/02/2005 Document Revised: 11/08/2015 Document Reviewed: 11/15/2013   Elsevier Interactive Patient Education © 2018 Elsevier Inc.

## 2018-04-09 ENCOUNTER — Ambulatory Visit (INDEPENDENT_AMBULATORY_CARE_PROVIDER_SITE_OTHER): Payer: Medicare Other | Admitting: Family Medicine

## 2018-04-09 ENCOUNTER — Encounter: Payer: Self-pay | Admitting: Family Medicine

## 2018-04-09 ENCOUNTER — Ambulatory Visit (INDEPENDENT_AMBULATORY_CARE_PROVIDER_SITE_OTHER): Payer: Medicare Other

## 2018-04-09 VITALS — BP 132/66 | HR 92 | Temp 98.6°F | Ht 63.0 in | Wt 229.2 lb

## 2018-04-09 DIAGNOSIS — I10 Essential (primary) hypertension: Secondary | ICD-10-CM | POA: Diagnosis not present

## 2018-04-09 DIAGNOSIS — Z23 Encounter for immunization: Secondary | ICD-10-CM | POA: Diagnosis not present

## 2018-04-09 DIAGNOSIS — R809 Proteinuria, unspecified: Secondary | ICD-10-CM | POA: Diagnosis not present

## 2018-04-09 DIAGNOSIS — E7800 Pure hypercholesterolemia, unspecified: Secondary | ICD-10-CM

## 2018-04-09 DIAGNOSIS — M792 Neuralgia and neuritis, unspecified: Secondary | ICD-10-CM

## 2018-04-09 DIAGNOSIS — Z0000 Encounter for general adult medical examination without abnormal findings: Secondary | ICD-10-CM

## 2018-04-09 DIAGNOSIS — E1100 Type 2 diabetes mellitus with hyperosmolarity without nonketotic hyperglycemic-hyperosmolar coma (NKHHC): Secondary | ICD-10-CM

## 2018-04-09 LAB — POCT GLYCOSYLATED HEMOGLOBIN (HGB A1C): HEMOGLOBIN A1C: 6.8 % — AB (ref 4.0–5.6)

## 2018-04-09 MED ORDER — OMEPRAZOLE 20 MG PO CPDR: 20.0000 mg | DELAYED_RELEASE_CAPSULE | Freq: Every day | ORAL | 3 refills | Status: DC

## 2018-04-09 NOTE — Assessment & Plan Note (Signed)
Discussed diet and exercise Discussed bariatric surgery - will place referral

## 2018-04-09 NOTE — Assessment & Plan Note (Signed)
Improving Continue gabapentin and nortriptyline

## 2018-04-09 NOTE — Progress Notes (Signed)
Subjective:   [name] is a 67 y.o. female who presents for Medicare Annual (Subsequent) preventive examination.  Review of Systems:  N/A  Cardiac Risk Factors include: advanced age (>42men, >71 women);diabetes mellitus;dyslipidemia;hypertension;obesity (BMI >30kg/m2)     Objective:     Vitals: BP 132/66 (BP Location: Right Arm)   Pulse 92   Temp 98.6 F (37 C) (Oral)   Ht 5\' 3"  (1.6 m)   Wt 229 lb 3.2 oz (104 kg)   BMI 40.60 kg/m   Body mass index is 40.6 kg/m.  Advanced Directives 04/09/2018 10/28/2017 04/27/2017 02/18/2017 12/01/2016 12/01/2016 12/01/2016  Does Patient Have a Medical Advance Directive? Yes Yes Yes No Yes Yes Yes  Type of Paramedic of [place];Living will Living will Living will - - Living will Living will  Does patient want to make changes to medical advance directive? - - - - - No - Patient declined -  Copy of [place] in Chart? No - copy requested - - - - - -  Would patient like information on creating a medical advance directive? - - - No - Patient declined - - -    Tobacco Social History   Tobacco Use  Smoking Status Never Smoker  Smokeless Tobacco Never Used     Counseling given: Not Answered   Clinical Intake:  Pre-visit preparation completed: Yes  Pain : No/denies pain Pain Score: 0-No pain    Nutrition Risk Assessment:  Has the patient had any N/V/D within the last 2 months?  No  Does the patient have any non-healing wounds?  No  Has the patient had any unintentional weight loss or weight gain?  No   Diabetes:  Is the patient diabetic?  Yes , type 2 If diabetic, was a CBG obtained today?  No  Did the patient bring in their glucometer from home?  No  How often do you monitor your CBG's? None.   Financial Strains and Diabetes Management:  Are you having any financial strains with the device, your supplies or your medication? No .  Does the patient want to be seen by Chronic Care  Management for management of their diabetes?  No  Would the patient like to be referred to a Nutritionist or for Diabetic Management?  No   Diabetic Exams:  Diabetic Eye Exam: Completed 02/19/18.   Diabetic Foot Exam: Completed 01/29/18.    How often do you need to have someone help you when you read instructions, pamphlets, or other written materials from your doctor or pharmacy?: 1 - Never  Interpreter Needed?: No  Information entered by :: [facility], LPN  Past Medical History:  Diagnosis Date  . Anxiety   . Arthritis   . Asthma   . Bronchitis 09/04/2016   urgent care  . Diabetes mellitus without complication ([place])   . Elevated C-reactive protein (CRP) 10/06/2016  . GERD (gastroesophageal reflux disease)   . History of shingles   . Hyperlipidemia   . Hypertension   . Neuralgia    Right side of head and face  . Shingles    chronic on Rt side of head and face   Past Surgical History:  Procedure Laterality Date  . ABDOMINAL HYSTERECTOMY    . BREAST BIOPSY Right 2016   benign- tophat clip  . BREAST BIOPSY Left 04/08/2016   -benign-fibroadenomatous changes- tophat clip  . BREAST LUMPECTOMY Right 03/2016  . BREAST LUMPECTOMY WITH NEEDLE LOCALIZATION Right 05/02/2016   Procedure: BREAST  LUMPECTOMY WITH NEEDLE LOCALIZATION;  Surgeon: Clayburn Pert, MD;  Location: [facility] ORS;  Service: General;  Laterality: Right;  . CHOLECYSTECTOMY    . KNEE ARTHROPLASTY Left 12/01/2016   Procedure: COMPUTER ASSISTED TOTAL KNEE ARTHROPLASTY;  Surgeon: Dereck Leep, MD;  Location: [facility] ORS;  Service: Orthopedics;  Laterality: Left;  . TOTAL KNEE ARTHROPLASTY Right 2014  . TOTAL KNEE ARTHROPLASTY Right 02/16/2013   Family History  Problem Relation Age of Onset  . Anuerysm Mother        Brain  . Dementia Mother   . Heart failure Father   . CAD Father   . Dementia Father   . Diabetes Father   . Lupus Sister   . Diabetes Brother   . Throat cancer Maternal Grandfather   . Breast  cancer Neg Hx    Social History   Socioeconomic History  . Marital status: Widowed    Spouse name: Not on file  . Number of children: 2  . Years of education: Not on file  . Highest education level: High school graduate  Occupational History  . Occupation: retired  Scientific laboratory technician  . Financial resource strain: Not hard at all  . Food insecurity:    Worry: Never true    Inability: Never true  . Transportation needs:    Medical: No    Non-medical: No  Tobacco Use  . Smoking status: Never Smoker  . Smokeless tobacco: Never Used  Substance and Sexual Activity  . Alcohol use: No    Alcohol/week: 0.0 standard drinks  . Drug use: No  . Sexual activity: Not on file  Lifestyle  . Physical activity:    Days per week: 2 days    Minutes per session: 70 min  . Stress: Not at all  Relationships  . Social connections:    Talks on phone: Patient refused    Gets together: Patient refused    Attends religious service: Patient refused    Active member of club or organization: Patient refused    Attends meetings of clubs or organizations: Patient refused    Relationship status: Patient refused  Other Topics Concern  . Not on file  Social History Narrative  . Not on file    Outpatient Encounter Medications as of 04/09/2018  Medication Sig  . atorvastatin (LIPITOR) 40 MG tablet Take 1 tablet (40 mg total) by mouth daily.  . carbamazepine (TEGRETOL) 200 MG tablet Take 200 mg by mouth at bedtime.   . Carboxymethylcellulose Sod PF (REFRESH PLUS) 0.5 % SOLN Apply to eye daily.   . Carboxymethylcellulose Sodium (LUBRICANT EYE DROPS OP) Place 1 drop into both eyes 4 (four) times daily as needed (FOR DRY EYES).  Marland Kitchen gabapentin (NEURONTIN) 600 MG tablet Take 1.5 tablets (900 mg total) by mouth every 6 (six) hours.  . hydrochlorothiazide (HYDRODIURIL) 12.5 MG tablet Take 1 tablet (12.5 mg total) by mouth daily.  . hydrocortisone (ANUSOL-HC) 2.5 % rectal cream Place 1 application rectally 2 (two)  times daily.  Marland Kitchen losartan (COZAAR) 100 MG tablet Take 1 tablet (100 mg total) by mouth daily.  . nortriptyline (PAMELOR) 75 MG capsule Take 1 capsule (75 mg total) by mouth at bedtime.  Marland Kitchen albuterol (PROVENTIL HFA;VENTOLIN HFA) 108 (90 Base) MCG/ACT inhaler Inhale 2 puffs into the lungs every 4 (four) hours as needed for wheezing or shortness of breath. (Patient not taking: Reported on 03/12/2018)  . Bepotastine Besilate (BEPREVE) 1.5 % SOLN 2 drops daily.   . diazepam (VALIUM) 5 MG  tablet TAKE 1 TABLET BY MOUTH IN THE EVENING BEFORE GOING TO BED AND 1 T...  (REFER TO PRESCRIPTION NOTES).  . fluticasone (FLONASE) 50 MCG/ACT nasal spray Place 2 sprays into both nostrils daily. (Patient not taking: Reported on 04/09/2018)  . fluticasone furoate-vilanterol (BREO ELLIPTA) 200-25 MCG/INH AEPB   . lansoprazole (PREVACID) 30 MG capsule   . Vitamin D, Ergocalciferol, (DRISDOL) 50000 units CAPS capsule Take by mouth.   No facility-administered encounter medications on file as of 04/09/2018.     Activities of Daily Living In your present state of health, do you have any difficulty performing the following activities: 04/09/2018  Hearing? N  Vision? N  Comment Wears readers prn.   Difficulty concentrating or making decisions? N  Walking or climbing stairs? Y  Comment Due to knee pains going up stairs.   Dressing or bathing? N  Doing errands, shopping? N  Preparing Food and eating ? N  Using the Toilet? N  In the past six months, have you accidently leaked urine? N  Do you have problems with loss of bowel control? N  Managing your Medications? N  Managing your Finances? N  Housekeeping or managing your Housekeeping? N  Some recent data might be hidden    Patient Care Team: Bacigalupo, Dionne Bucy, MD as PCP - General (Family Medicine) Milinda Pointer, MD as Referring Physician (Pain Medicine) Marry Guan Laurice Record, MD as Consulting Physician (Orthopedic Surgery)    Assessment:   This is a routine  wellness examination for [place].  Exercise Activities and Dietary recommendations Current Exercise Habits: Structured exercise class, Time (Minutes): > 60(1 hour and 15 minutes), Frequency (Times/Week): 2(to 3 days), Weekly Exercise (Minutes/Week): 0, Intensity: Mild, Exercise limited by: orthopedic condition(s)  Goals    . Decrease soda or juice intake     Recommend decreasing soda intake to 1 a day and increase water intake 6 glasses of water a day.     . Have 3 meals a day     Recommend to eat 3 small meals a day with 2 healthy snacks in between.       Fall Risk Fall Risk  04/09/2018 10/28/2017 05/05/2017 02/18/2017 10/29/2016  Falls in the past year? No No No No No  Number falls in past yr: - - - - -  Injury with Fall? - - - - -  Risk for fall due to : - - - - -  Risk for fall due to: Comment - - - - -   FALL [place]:  Any stairs in or around the home WITH handrails? Yes  Home free of loose throw rugs in walkways, pet beds, electrical cords, etc? Yes  Adequate lighting in your home to reduce risk of falls? Yes   ASSISTIVE DEVICES UTILIZED TO PREVENT FALLS:  Life alert? No  Use of a cane, walker or w/c? No  Grab bars in the bathroom? No  Shower chair or bench in shower? No  Elevated toilet seat or a handicapped toilet? No    TIMED UP AND GO:  Was the test performed? No .     Depression Screen PHQ 2/9 Scores 04/09/2018 10/28/2017 05/05/2017 02/18/2017  PHQ - 2 Score 3 0 0 2  PHQ- 9 Score 6 - - 5     Cognitive Function: Declined today.         Immunization History  Administered Date(s) Administered  . Influenza Split 04/09/2010, 04/02/2012, 05/26/2012  . Influenza, High Dose Seasonal PF  04/04/2014, 02/29/2016, 02/24/2017, 04/09/2018  . Pneumococcal Conjugate-13 02/24/2017  . Pneumococcal Polysaccharide-23 04/09/2018  . Td 06/16/1998  . Tdap 07/25/2008    Qualifies for Shingles Vaccine? Yes . Due for Shingrix. Education has been  provided regarding the importance of this vaccine. Pt has been advised to call insurance company to determine out of pocket expense. Advised may also receive vaccine at local pharmacy or Health Dept. Verbalized acceptance and understanding.  Tdap: Up to date  Flu Vaccine: Due for Flu vaccine. Does the patient want to receive this vaccine today?  Yes .   Pneumococcal Vaccine: Due for Pneumococcal vaccine. Does the patient want to receive this vaccine today?  Yes .   Screening Tests Health Maintenance  Topic Date Due  . HEMOGLOBIN A1C  05/07/2018  . TETANUS/TDAP  07/25/2018  . COLONOSCOPY  12/15/2018  . FOOT EXAM  01/30/2019  . OPHTHALMOLOGY EXAM  02/20/2019  . MAMMOGRAM  10/23/2019  . INFLUENZA VACCINE  Completed  . DEXA SCAN  Completed  . Hepatitis C Screening  Completed  . PNA vac Low Risk Adult  Completed   Cancer Screenings:  Colorectal Screening: Completed 01/15/17. Repeat every 10 years.  Mammogram: Completed 10/22/17.   Bone Density: Completed 07/27/08. Results reflect NORMAL.  Lung Cancer Screening: (Low Dose CT Chest recommended if Age 53-80 years, 30 pack-year currently smoking OR have quit w/in 15years.) does not qualify.    Additional Screening:  Hepatitis C Screening: Up to date  Vision Screening: Recommended annual ophthalmology exams for early detection of glaucoma and other disorders of the eye. Dental Screening: Recommended annual dental exams for proper oral hygiene  Community Resource Referral:  CRR required this visit?  No       Plan:  I have personally reviewed and addressed the Medicare Annual Wellness questionnaire and have noted the following in the patient's chart:  A. Medical and social history B. Use of alcohol, tobacco or illicit drugs  C. Current medications and supplements D. Functional ability and status E.  Nutritional status F.  Physical activity G. Advance directives H. List of other physicians I.  Hospitalizations, surgeries,  and ER visits in previous 12 months J.  [place] such as hearing and vision if needed, cognitive and depression L. Referrals and appointments - none  In addition, I have reviewed and discussed with patient certain preventive protocols, quality metrics, and best practice recommendations. A written personalized care plan for preventive services as well as general preventive health recommendations were provided to patient.  See attached scanned questionnaire for additional information.   Signed,  Fabio Neighbors, LPN Nurse Health Advisor   Nurse Recommendations: None.

## 2018-04-09 NOTE — Patient Instructions (Addendum)
[name] , Thank you for taking time to come for your Medicare Wellness Visit. I appreciate your ongoing commitment to your health goals. Please review the following plan we discussed and let me know if I can assist you in the future.   Screening recommendations/referrals: Colonoscopy: Up to date Mammogram: Up to date Bone Density: Up to date Recommended yearly ophthalmology/optometry visit for glaucoma screening and checkup Recommended yearly dental visit for hygiene and checkup  Vaccinations: Influenza vaccine: Up to date Pneumococcal vaccine: Up to date Tdap vaccine: Up to date Shingles vaccine: Pt declines today.     Advanced directives: Please bring a copy of your POA (Power of Attorney) and/or Living Will to your next appointment.   Conditions/risks identified: Obesity- recommend to eat 3 small meals a day with 2 healthy snacks in between.  Next appointment: 10:00 AM today with Dr Brita Romp.    Preventive Care 33 Years and Older, Female Preventive care refers to lifestyle choices and visits with your health care provider that can promote health and wellness. What does preventive care include?  A yearly physical exam. This is also called an annual well check.  Dental exams once or twice a year.  Routine eye exams. Ask your health care provider how often you should have your eyes checked.  Personal lifestyle choices, including:  Daily care of your teeth and gums.  Regular physical activity.  Eating a healthy diet.  Avoiding tobacco and drug use.  Limiting alcohol use.  Practicing safe sex.  Taking low-dose aspirin every day.  Taking vitamin and mineral supplements as recommended by your health care provider. What happens during an annual well check? The services and screenings done by your health care provider during your annual well check will depend on your age, overall health, lifestyle risk factors, and family history of disease. Counseling  Your health  care provider may ask you questions about your:  Alcohol use.  Tobacco use.  Drug use.  Emotional well-being.  Home and relationship well-being.  Sexual activity.  Eating habits.  History of falls.  Memory and ability to understand (cognition).  Work and work Statistician.  Reproductive health. Screening  You may have the following tests or measurements:  Height, weight, and BMI.  Blood pressure.  Lipid and cholesterol levels. These may be checked every 5 years, or more frequently if you are over 32 years old.  Skin check.  Lung cancer screening. You may have this screening every year starting at age 93 if you have a 30-pack-year history of smoking and currently smoke or have quit within the past 15 years.  Fecal occult blood test (FOBT) of the stool. You may have this test every year starting at age 21.  Flexible sigmoidoscopy or colonoscopy. You may have a sigmoidoscopy every 5 years or a colonoscopy every 10 years starting at age 66.  Hepatitis C blood test.  Hepatitis B blood test.  Sexually transmitted disease (STD) testing.  Diabetes screening. This is done by checking your blood sugar (glucose) after you have not eaten for a while (fasting). You may have this done every 1-3 years.  Bone density scan. This is done to screen for osteoporosis. You may have this done starting at age 41.  Mammogram. This may be done every 1-2 years. Talk to your health care provider about how often you should have regular mammograms. Talk with your health care provider about your test results, treatment options, and if necessary, the need for more tests. Vaccines  Your  health care provider may recommend certain vaccines, such as:  Influenza vaccine. This is recommended every year.  Tetanus, diphtheria, and acellular pertussis (Tdap, Td) vaccine. You may need a Td booster every 10 years.  Zoster vaccine. You may need this after age 71.  Pneumococcal 13-valent conjugate  (PCV13) vaccine. One dose is recommended after age 104.  Pneumococcal polysaccharide (PPSV23) vaccine. One dose is recommended after age 31. Talk to your health care provider about which screenings and vaccines you need and how often you need them. This information is not intended to replace advice given to you by your health care provider. Make sure you discuss any questions you have with your health care provider. Document Released: 06/29/2015 Document Revised: 02/20/2016 Document Reviewed: 04/03/2015 Elsevier Interactive Patient Education  2017 [employer] Prevention in the Home Falls can cause injuries. They can happen to people of all ages. There are many things you can do to make your home safe and to help prevent falls. What can I do on the outside of my home?  Regularly fix the edges of walkways and driveways and fix any cracks.  Remove anything that might make you trip as you walk through a door, such as a raised step or threshold.  Trim any bushes or trees on the path to your home.  Use bright outdoor lighting.  Clear any walking paths of anything that might make someone trip, such as rocks or tools.  Regularly check to see if handrails are loose or broken. Make sure that both sides of any steps have handrails.  Any raised decks and porches should have guardrails on the edges.  Have any leaves, snow, or ice cleared regularly.  Use sand or salt on walking paths during winter.  Clean up any spills in your garage right away. This includes oil or grease spills. What can I do in the bathroom?  Use night lights.  Install grab bars by the toilet and in the tub and shower. Do not use towel bars as grab bars.  Use non-skid mats or decals in the tub or shower.  If you need to sit down in the shower, use a plastic, non-slip stool.  Keep the floor dry. Clean up any water that spills on the floor as soon as it happens.  Remove soap buildup in the tub or shower  regularly.  Attach bath mats securely with double-sided non-slip rug tape.  Do not have throw rugs and other things on the floor that can make you trip. What can I do in the bedroom?  Use night lights.  Make sure that you have a light by your bed that is easy to reach.  Do not use any sheets or blankets that are too big for your bed. They should not hang down onto the floor.  Have a firm chair that has side arms. You can use this for support while you get dressed.  Do not have throw rugs and other things on the floor that can make you trip. What can I do in the kitchen?  Clean up any spills right away.  Avoid walking on wet floors.  Keep items that you use a lot in easy-to-reach places.  If you need to reach something above you, use a strong step stool that has a grab bar.  Keep electrical cords out of the way.  Do not use floor polish or wax that makes floors slippery. If you must use wax, use non-skid floor wax.  Do not  have throw rugs and other things on the floor that can make you trip. What can I do with my stairs?  Do not leave any items on the stairs.  Make sure that there are handrails on both sides of the stairs and use them. Fix handrails that are broken or loose. Make sure that handrails are as long as the stairways.  Check any carpeting to make sure that it is firmly attached to the stairs. Fix any carpet that is loose or worn.  Avoid having throw rugs at the top or bottom of the stairs. If you do have throw rugs, attach them to the floor with carpet tape.  Make sure that you have a light switch at the top of the stairs and the bottom of the stairs. If you do not have them, ask someone to add them for you. What else can I do to help prevent falls?  Wear shoes that:  Do not have high heels.  Have rubber bottoms.  Are comfortable and fit you well.  Are closed at the toe. Do not wear sandals.  If you use a stepladder:  Make sure that it is fully  opened. Do not climb a closed stepladder.  Make sure that both sides of the stepladder are locked into place.  Ask someone to hold it for you, if possible.  Clearly mark and make sure that you can see:  Any grab bars or handrails.  First and last steps.  Where the edge of each step is.  Use tools that help you move around (mobility aids) if they are needed. These include:  Canes.  Walkers.  Scooters.  Crutches.  Turn on the lights when you go into a dark area. Replace any light bulbs as soon as they burn out.  Set up your furniture so you have a clear path. Avoid moving your furniture around.  If any of your floors are uneven, fix them.  If there are any pets around you, be aware of where they are.  Review your medicines with your doctor. Some medicines can make you feel dizzy. This can increase your chance of falling. Ask your doctor what other things that you can do to help prevent falls. This information is not intended to replace advice given to you by your health care provider. Make sure you discuss any questions you have with your health care provider. Document Released: 03/29/2009 Document Revised: 11/08/2015 Document Reviewed: 07/07/2014 Elsevier Interactive Patient Education  2017 [employer].

## 2018-04-09 NOTE — Assessment & Plan Note (Signed)
Recheck lipid panel and CMP Continue Lipitor

## 2018-04-09 NOTE — Assessment & Plan Note (Signed)
Well controlled  Continue losartan and HCTZ at current doses

## 2018-04-09 NOTE — Patient Instructions (Addendum)
The CDC recommends two doses of Shingrix (the shingles vaccine) separated by 2 to 6 months for adults age 69 years and older. I recommend checking with your insurance plan regarding coverage for this vaccine.    Obesity, Adult Obesity is the condition of having too much total body fat. Being overweight or obese means that your weight is greater than what is considered healthy for your body size. Obesity is determined by a measurement called BMI. BMI is an estimate of body fat and is calculated from height and weight. For adults, a BMI of 30 or higher is considered obese. Obesity can eventually lead to other health concerns and major illnesses, including:  Stroke.  Coronary artery disease (CAD).  Type 2 diabetes.  Some types of cancer, including cancers of the colon, breast, uterus, and gallbladder.  Osteoarthritis.  High blood pressure (hypertension).  High cholesterol.  Sleep apnea.  Gallbladder stones.  Infertility problems.  What are the causes? The main cause of obesity is taking in (consuming) more calories than your body uses for energy. Other factors that contribute to this condition may include:  Being born with genes that make you more likely to become obese.  Having a medical condition that causes obesity. These conditions include: ? Hypothyroidism. ? Polycystic ovarian syndrome (PCOS). ? Binge-eating disorder. ? Cushing syndrome.  Taking certain medicines, such as steroids, antidepressants, and seizure medicines.  Not being physically active (sedentary lifestyle).  Living where there are limited places to exercise safely or buy healthy foods.  Not getting enough sleep.  What increases the risk? The following factors may increase your risk of this condition:  Having a family history of obesity.  Being a woman of African-American descent.  Being a man of Hispanic descent.  What are the signs or symptoms? Having excessive body fat is the main symptom  of this condition. How is this diagnosed? This condition may be diagnosed based on:  Your symptoms.  Your medical history.  A physical exam. Your health care provider may measure: ? Your BMI. If you are an adult with a BMI between 25 and less than 30, you are considered overweight. If you are an adult with a BMI of 30 or higher, you are considered obese. ? The distances around your hips and your waist (circumferences). These may be compared to each other to help diagnose your condition. ? Your skinfold thickness. Your health care provider may gently pinch a fold of your skin and measure it.  How is this treated? Treatment for this condition often includes changing your lifestyle. Treatment may include some or all of the following:  Dietary changes. Work with your health care provider and a dietitian to set a weight-loss goal that is healthy and reasonable for you. Dietary changes may include eating: ? Smaller portions. A portion size is the amount of a particular food that is healthy for you to eat at one time. This varies from person to person. ? Low-calorie or low-fat options. ? More whole grains, fruits, and vegetables.  Regular physical activity. This may include aerobic activity (cardio) and strength training.  Medicine to help you lose weight. Your health care provider may prescribe medicine if you are unable to lose 1 pound a week after 6 weeks of eating more healthily and doing more physical activity.  Surgery. Surgical options may include gastric banding and gastric bypass. Surgery may be done if: ? Other treatments have not helped to improve your condition. ? You have a BMI of  40 or higher. ? You have life-threatening health problems related to obesity.  Follow these instructions at home:  Eating and drinking   Follow recommendations from your health care provider about what you eat and drink. Your health care provider may advise you to: ? Limit fast foods, sweets, and  processed snack foods. ? Choose low-fat options, such as low-fat milk instead of whole milk. ? Eat 5 or more servings of fruits or vegetables every day. ? Eat at home more often. This gives you more control over what you eat. ? Choose healthy foods when you eat out. ? Learn what a healthy portion size is. ? Keep low-fat snacks on hand. ? Avoid sugary drinks, such as soda, fruit juice, iced tea sweetened with sugar, and flavored milk. ? Eat a healthy breakfast.  Drink enough water to keep your urine clear or pale yellow.  Do not go without eating for long periods of time (do not fast) or follow a fad diet. Fasting and fad diets can be unhealthy and even dangerous. Physical Activity  Exercise regularly, as told by your health care provider. Ask your health care provider what types of exercise are safe for you and how often you should exercise.  Warm up and stretch before being active.  Cool down and stretch after being active.  Rest between periods of activity. Lifestyle  Limit the time that you spend in front of your TV, computer, or video game system.  Find ways to reward yourself that do not involve food.  Limit alcohol intake to no more than 1 drink a day for nonpregnant women and 2 drinks a day for men. One drink equals 12 oz of beer, 5 oz of wine, or 1 oz of hard liquor. General instructions  Keep a weight loss journal to keep track of the food you eat and how much you exercise you get.  Take over-the-counter and prescription medicines only as told by your health care provider.  Take vitamins and supplements only as told by your health care provider.  Consider joining a support group. Your health care provider may be able to recommend a support group.  Keep all follow-up visits as told by your health care provider. This is important. Contact a health care provider if:  You are unable to meet your weight loss goal after 6 weeks of dietary and lifestyle changes. This  information is not intended to replace advice given to you by your health care provider. Make sure you discuss any questions you have with your health care provider. Document Released: 07/10/2004 Document Revised: 11/05/2015 Document Reviewed: 03/21/2015 Elsevier Interactive Patient Education  2018 [employer].

## 2018-04-09 NOTE — Assessment & Plan Note (Signed)
Well-controlled with diet no medications Advised on diet and exercise Up-to-date on screenings and vaccinations

## 2018-04-09 NOTE — Progress Notes (Signed)
Patient: [name], Female    DOB: 10/18/1950, 67 y.o.   MRN: [id number] Visit Date: 04/09/2018  Today's Provider: Lavon Paganini, MD   Chief Complaint  Patient presents with  . Hypertension  . Hyperlipidemia   Subjective:    Diabetes Mellitus Type II, Follow-up:   Lab Results  Component Value Date   HGBA1C 6.8 (A) 04/09/2018   HGBA1C 6.5 (A) 11/04/2017   HGBA1C 6.4 06/26/2017    Last seen for diabetes 6 months ago.  Management since then includes no changes She reports good compliance with treatment. She is not having side effects.  Current symptoms include none Home blood sugar records: fasting range: not being checked at home  Current Insulin Regimen: none Most Recent Eye Exam: 02/19/2018 Weight trend: stable Current exercise: cardiovascular workout on exercise equipment  Pertinent Labs:    Component Value Date/Time   CHOL 182 03/26/2017 0905   CHOL 181 02/29/2016 0956   CHOL 210 (H) 11/03/2013 0921   TRIG 132 03/26/2017 0905   TRIG 97 11/03/2013 0921   HDL 46 (L) 03/26/2017 0905   HDL 48 02/29/2016 0956   HDL 50 11/03/2013 0921   LDLCALC 111 (H) 03/26/2017 0905   LDLCALC 141 (H) 11/03/2013 0921   CREATININE 0.96 03/26/2017 0905    Wt Readings from Last 3 Encounters:  04/09/18 229 lb 3.2 oz (104 kg)  04/09/18 229 lb 3.2 oz (104 kg)  03/12/18 225 lb 6.4 oz (102.2 kg)    ------------------------------------------------------------------------   Hypertension, follow-up:  BP Readings from Last 3 Encounters:  04/09/18 132/66  04/09/18 132/66  03/12/18 126/85    She was last seen for hypertension 2 months ago.  BP at that visit was 154/104. Management since that visit includes continue medications.She reports good compliance with treatment. She is not having side effects.  She is exercising. She is adherent to low salt diet.   Outside blood pressures are not being checked. She is experiencing none.  Patient denies chest pain, chest  pressure/discomfort, claudication, dyspnea, exertional chest pressure/discomfort, fatigue, irregular heart beat, lower extremity edema, near-syncope, orthopnea, palpitations, paroxysmal nocturnal dyspnea, syncope and tachypnea.   Cardiovascular risk factors include advanced age (older than 59 for men, 73 for women), dyslipidemia and hypertension.  Use of agents associated with hypertension: none.   ------------------------------------------------------------------------   Lipid/Cholesterol, Follow-up:   Last seen for this 2 months ago.  Management since that visit includes no changes.  Last Lipid Panel:    Component Value Date/Time   CHOL 182 03/26/2017 0905   CHOL 181 02/29/2016 0956   CHOL 210 (H) 11/03/2013 0921   TRIG 132 03/26/2017 0905   TRIG 97 11/03/2013 0921   HDL 46 (L) 03/26/2017 0905   HDL 48 02/29/2016 0956   HDL 50 11/03/2013 0921   CHOLHDL 4.0 03/26/2017 0905   VLDL 19 11/03/2013 0921   LDLCALC 111 (H) 03/26/2017 0905   LDLCALC 141 (H) 11/03/2013 0921    She reports good compliance with treatment. She is not having side effects.   Wt Readings from Last 3 Encounters:  04/09/18 229 lb 3.2 oz (104 kg)  04/09/18 229 lb 3.2 oz (104 kg)  03/12/18 225 lb 6.4 oz (102.2 kg)    ------------------------------------------------------------------------ Patient states that since increasing her nortriptyline to 75 mg nightly, she is doing much better.  Her neuropathic pain from her shingles has improved significantly and she is cut back on her gabapentin dose.  Patient is also concerned about her weight.  She knows  she is gained about 10 pounds over the last year.  She is interested in discussing options of her bariatric surgery. -----------------------------------------------------------   Review of Systems  Constitutional: Negative.   HENT: Negative.   Eyes: Positive for pain.  Respiratory: Negative.   Cardiovascular: Negative.   Gastrointestinal: Negative.    Endocrine: Negative.   Genitourinary: Negative.   Musculoskeletal: Negative.   Skin: Negative.   Allergic/Immunologic: Negative.   Neurological: Negative.   Hematological: Negative.   Psychiatric/Behavioral: Negative.     Social History   Socioeconomic History  . Marital status: Widowed    Spouse name: Not on file  . Number of children: 2  . Years of education: Not on file  . Highest education level: High school graduate  Occupational History  . Occupation: retired  Scientific laboratory technician  . Financial resource strain: Not hard at all  . Food insecurity:    Worry: Never true    Inability: Never true  . Transportation needs:    Medical: No    Non-medical: No  Tobacco Use  . Smoking status: Never Smoker  . Smokeless tobacco: Never Used  Substance and Sexual Activity  . Alcohol use: No    Alcohol/week: 0.0 standard drinks  . Drug use: No  . Sexual activity: Not on file  Lifestyle  . Physical activity:    Days per week: 2 days    Minutes per session: 70 min  . Stress: Not at all  Relationships  . Social connections:    Talks on phone: Patient refused    Gets together: Patient refused    Attends religious service: Patient refused    Active member of club or organization: Patient refused    Attends meetings of clubs or organizations: Patient refused    Relationship status: Patient refused  . Intimate partner violence:    Fear of current or ex partner: Patient refused    Emotionally abused: Patient refused    Physically abused: Patient refused    Forced sexual activity: Patient refused  Other Topics Concern  . Not on file  Social History Narrative  . Not on file    Patient Active Problem List   Diagnosis Date Noted  . Change in bowel habits 01/29/2018  . Rectal irritation 01/29/2018  . Postnasal drip 11/04/2017  . Other viral warts 11/04/2017  . Acute pain of both knees 08/07/2017  . Microalbuminuria 03/26/2017  . Primary osteoarthritis involving multiple joints  12/05/2016  . Status post total left knee replacement 12/01/2016  . Primary osteoarthritis of left knee 09/07/2016  . Hypertension 07/24/2016  . Occipital headache 01/21/2016  . Bilateral occipital neuralgia 01/21/2016  . Chronic pain syndrome 05/07/2015  . Gasserian ganglionitis 03/12/2015  . Neuropathic pain 03/12/2015  . Herpes zoster ophthalmicus 03/12/2015  . Anxiety 01/11/2015  . Diabetes ([place]) 01/11/2015  . Photopsia 01/11/2015  . Avitaminosis A 01/11/2015  . Depression 01/10/2015  . Obesity (BMI 35.0-39.9 without comorbidity) ([place]) 04/18/2014  . Neuropathic postherpetic trigeminal neuralgia (Location of Primary Source of Pain) (Right) (V1) 12/21/2013  . Cannot sleep 03/13/2009  . Asthma 02/07/2009  . Calcium deficiency disease 10/16/2008  . Hypercholesterolemia without hypertriglyceridemia 07/06/2008  . Avitaminosis D 07/05/2008  . History of colon polyps 10/17/2005    Past Surgical History:  Procedure Laterality Date  . ABDOMINAL HYSTERECTOMY    . BREAST BIOPSY Right 2016   benign- tophat clip  . BREAST BIOPSY Left 04/08/2016   -benign-fibroadenomatous changes- tophat clip  . BREAST LUMPECTOMY Right 03/2016  .  BREAST LUMPECTOMY WITH NEEDLE LOCALIZATION Right 05/02/2016   Procedure: BREAST LUMPECTOMY WITH NEEDLE LOCALIZATION;  Surgeon: Clayburn Pert, MD;  Location: [facility] ORS;  Service: General;  Laterality: Right;  . CHOLECYSTECTOMY    . KNEE ARTHROPLASTY Left 12/01/2016   Procedure: COMPUTER ASSISTED TOTAL KNEE ARTHROPLASTY;  Surgeon: Dereck Leep, MD;  Location: [facility] ORS;  Service: Orthopedics;  Laterality: Left;  . TOTAL KNEE ARTHROPLASTY Right 2014  . TOTAL KNEE ARTHROPLASTY Right 02/16/2013    Her family history includes Anuerysm in her mother; CAD in her father; Dementia in her father and mother; Diabetes in her brother and father; Heart failure in her father; Lupus in her sister; Throat cancer in her maternal grandfather.     Previous Medications    ALBUTEROL (PROVENTIL HFA;VENTOLIN HFA) 108 (90 BASE) MCG/ACT INHALER    Inhale 2 puffs into the lungs every 4 (four) hours as needed for wheezing or shortness of breath.   ATORVASTATIN (LIPITOR) 40 MG TABLET    Take 1 tablet (40 mg total) by mouth daily.   BEPOTASTINE BESILATE (BEPREVE) 1.5 % SOLN    2 drops daily.    CARBAMAZEPINE (TEGRETOL) 200 MG TABLET    Take 200 mg by mouth at bedtime.    CARBOXYMETHYLCELLULOSE SOD PF (REFRESH PLUS) 0.5 % SOLN    Apply to eye daily.    CARBOXYMETHYLCELLULOSE SODIUM (LUBRICANT EYE DROPS OP)    Place 1 drop into both eyes 4 (four) times daily as needed (FOR DRY EYES).   DIAZEPAM (VALIUM) 5 MG TABLET    TAKE 1 TABLET BY MOUTH IN THE EVENING BEFORE GOING TO BED AND 1 T...  (REFER TO PRESCRIPTION NOTES).   FLUTICASONE (FLONASE) 50 MCG/ACT NASAL SPRAY    Place 2 sprays into both nostrils daily.   FLUTICASONE FUROATE-VILANTEROL (BREO ELLIPTA) 200-25 MCG/INH AEPB       GABAPENTIN (NEURONTIN) 600 MG TABLET    Take 1.5 tablets (900 mg total) by mouth every 6 (six) hours.   HYDROCHLOROTHIAZIDE (HYDRODIURIL) 12.5 MG TABLET    Take 1 tablet (12.5 mg total) by mouth daily.   HYDROCORTISONE (ANUSOL-HC) 2.5 % RECTAL CREAM    Place 1 application rectally 2 (two) times daily.   LANSOPRAZOLE (PREVACID) 30 MG CAPSULE       LOSARTAN (COZAAR) 100 MG TABLET    Take 1 tablet (100 mg total) by mouth daily.   NORTRIPTYLINE (PAMELOR) 75 MG CAPSULE    Take 1 capsule (75 mg total) by mouth at bedtime.   VITAMIN D, ERGOCALCIFEROL, (DRISDOL) 50000 UNITS CAPS CAPSULE    Take by mouth.    Patient Care Team: Virginia Crews, MD as PCP - General (Family Medicine) Milinda Pointer, MD as Referring Physician (Pain Medicine) Marry Guan Laurice Record, MD as Consulting Physician (Orthopedic Surgery)      Objective:   Vitals: BP 132/66 (BP Location: Right Arm, Patient Position: Sitting, Cuff Size: Normal)   Pulse 92   Temp 98.6 F (37 C) (Oral)   Ht 5\' 3"  (1.6 m)   Wt 229 lb 3.2 oz (104  kg)   BMI 40.60 kg/m   Physical Exam  Constitutional: She is oriented to person, place, and time. She appears well-developed and well-nourished. No distress.  HENT:  Head: Normocephalic and atraumatic.  Right Ear: External ear normal.  Left Ear: External ear normal.  Nose: Nose normal.  Mouth/Throat: Oropharynx is clear and moist.  Eyes: Pupils are equal, round, and reactive to light. Conjunctivae are normal. Right eye exhibits no discharge.  Left eye exhibits no discharge. No scleral icterus.  Neck: Neck supple. No thyromegaly present.  Cardiovascular: Normal rate, regular rhythm, normal heart sounds and intact distal pulses.  No murmur heard. Pulmonary/Chest: Effort normal and breath sounds normal. No respiratory distress. She has no wheezes. She has no rales.  Abdominal: Soft. She exhibits no distension. There is no tenderness.  Musculoskeletal: She exhibits no edema.  Lymphadenopathy:    She has no cervical adenopathy.  Neurological: She is alert and oriented to person, place, and time.  Skin: Skin is warm and dry. Capillary refill takes less than 2 seconds. No rash noted.  Psychiatric: She has a normal mood and affect. Her behavior is normal.  Vitals reviewed.   Results for orders placed or performed in visit on 04/09/18  POCT HgB A1C  Result Value Ref Range   Hemoglobin A1C 6.8 (A) 4.0 - 5.6 %   HbA1c POC (<> result, manual entry)     HbA1c, POC (prediabetic range)     HbA1c, POC (controlled diabetic range)       Activities of Daily Living In your present state of health, do you have any difficulty performing the following activities: 04/09/2018  Hearing? N  Vision? N  Comment Wears readers prn.   Difficulty concentrating or making decisions? N  Walking or climbing stairs? Y  Comment Due to knee pains going up stairs.   Dressing or bathing? N  Doing errands, shopping? N  Preparing Food and eating ? N  Using the Toilet? N  In the past six months, have you  accidently leaked urine? N  Do you have problems with loss of bowel control? N  Managing your Medications? N  Managing your Finances? N  Housekeeping or managing your Housekeeping? N  Some recent data might be hidden    Fall Risk Assessment Fall Risk  04/09/2018 10/28/2017 05/05/2017 02/18/2017 10/29/2016  Falls in the past year? No No No No No  Number falls in past yr: - - - - -  Injury with Fall? - - - - -  Risk for fall due to : - - - - -  Risk for fall due to: Comment - - - - -     Depression Screen PHQ 2/9 Scores 04/09/2018 10/28/2017 05/05/2017 02/18/2017  PHQ - 2 Score 3 0 0 2  PHQ- 9 Score 6 - - 5    Assessment & Plan:     Annual Wellness Visit  Reviewed patient's Family Medical History Reviewed and updated list of patient's medical providers Assessment of cognitive impairment was done Assessed patient's functional ability Established a written schedule for health screening [place] Completed and Reviewed  Exercise Activities and Dietary recommendations Goals    . Decrease soda or juice intake     Recommend decreasing soda intake to 1 a day and increase water intake 6 glasses of water a day.     . Have 3 meals a day     Recommend to eat 3 small meals a day with 2 healthy snacks in between.       Immunization History  Administered Date(s) Administered  . Influenza Split 04/09/2010, 04/02/2012, 05/26/2012  . Influenza, High Dose Seasonal PF 04/04/2014, 02/29/2016, 02/24/2017, 04/09/2018  . Pneumococcal Conjugate-13 02/24/2017  . Pneumococcal Polysaccharide-23 04/09/2018  . Tdap 07/25/2008    Health Maintenance  Topic Date Due  . TETANUS/TDAP  07/25/2018  . HEMOGLOBIN A1C  10/09/2018  . COLONOSCOPY  12/15/2018  . FOOT EXAM  01/30/2019  .  OPHTHALMOLOGY EXAM  02/20/2019  . MAMMOGRAM  10/23/2019  . INFLUENZA VACCINE  Completed  . DEXA SCAN  Completed  . Hepatitis C Screening  Completed  . PNA vac Low Risk Adult  Completed      Discussed health benefits of physical activity, and encouraged her to engage in regular exercise appropriate for her age and condition.    ------------------------------------------------------------------------------------------------------------  Problem List Items Addressed This Visit      Cardiovascular and Mediastinum   Hypertension    Well controlled  Continue losartan and HCTZ at current doses      Relevant Orders   Comprehensive metabolic panel     Endocrine   Diabetes ([place]) - Primary    Well-controlled with diet no medications Advised on diet and exercise Up-to-date on screenings and vaccinations      Relevant Orders   POCT HgB A1C (Completed)     Other   Neuropathic pain (Chronic)    Improving Continue gabapentin and nortriptyline      Morbid obesity ([place])    Discussed diet and exercise Discussed bariatric surgery - will place referral      Relevant Orders   Amb Referral to Bariatric Surgery   Hypercholesterolemia without hypertriglyceridemia    Recheck lipid panel and CMP Continue Lipitor      Relevant Orders   Comprehensive metabolic panel   Lipid panel   Microalbuminuria    Secondary to diabetes Discussed importance of tight diabetes control Continue losartan          Return in about 6 months (around 10/09/2018) for chronic disease f/u.   The entirety of the information documented in the History of Present Illness, Review of Systems and Physical Exam were personally obtained by me. Portions of this information were initially documented by Tiburcio Pea and Lyndel Pleasure, CMA and reviewed by me for thoroughness and accuracy.    Virginia Crews, MD, MPH [facility] 04/09/2018 2:44 PM

## 2018-04-09 NOTE — Assessment & Plan Note (Signed)
Secondary to diabetes Discussed importance of tight diabetes control Continue losartan

## 2018-04-10 LAB — LIPID PANEL
CHOL/HDL RATIO: 3 ratio (ref 0.0–4.4)
Cholesterol, Total: 140 mg/dL (ref 100–199)
HDL: 47 mg/dL (ref >39)
LDL Calculated: 76 mg/dL (ref 0–99)
TRIGLYCERIDES: 87 mg/dL (ref 0–149)
VLDL Cholesterol Cal: 17 mg/dL (ref 5–40)

## 2018-04-10 LAB — COMPREHENSIVE METABOLIC PANEL
A/G RATIO: 1.1 — AB (ref 1.2–2.2)
ALT: 15 IU/L (ref 0–32)
AST: 17 IU/L (ref 0–40)
Albumin: 3.7 g/dL (ref 3.6–4.8)
Alkaline Phosphatase: 106 IU/L (ref 39–117)
BUN/Creatinine Ratio: 13 (ref 12–28)
BUN: 12 mg/dL (ref 8–27)
Bilirubin Total: 0.3 mg/dL (ref 0.0–1.2)
CALCIUM: 9.3 mg/dL (ref 8.7–10.3)
CO2: 24 mmol/L (ref 20–29)
Chloride: 100 mmol/L (ref 96–106)
Creatinine, Ser: 0.9 mg/dL (ref 0.57–1.00)
GFR calc Af Amer: 75 mL/min/{1.73_m2} (ref >59)
GFR, EST NON AFRICAN AMERICAN: 65 mL/min/{1.73_m2} (ref >59)
GLUCOSE: 101 mg/dL — AB (ref 65–99)
Globulin, Total: 3.3 g/dL (ref 1.5–4.5)
POTASSIUM: 4.1 mmol/L (ref 3.5–5.2)
Sodium: 141 mmol/L (ref 134–144)
TOTAL PROTEIN: 7 g/dL (ref 6.0–8.5)

## 2018-04-12 ENCOUNTER — Telehealth: Payer: Self-pay

## 2018-04-12 NOTE — Telephone Encounter (Signed)
-----   Message from Virginia Crews, MD sent at 04/12/2018  8:23 AM EDT ----- Normal labs  Bacigalupo, Dionne Bucy, MD, MPH [facility] 04/12/2018 8:23 AM

## 2018-04-12 NOTE — Telephone Encounter (Signed)
LMTCB

## 2018-04-13 NOTE — Telephone Encounter (Signed)
Patient advised.

## 2018-04-26 ENCOUNTER — Encounter: Payer: Medicare Other | Admitting: Nurse Practitioner

## 2018-06-21 IMAGING — MG MM DIGITAL SCREENING BILAT W/ TOMO W/ CAD
8 series · 9 of 24 positions shown · non-contrast
Comparison: Previous exam(s).

CLINICAL DATA: Screening.

EXAM:
DIGITAL SCREENING BILATERAL MAMMOGRAM WITH TOMO AND CAD

[R MLO synth-2D]
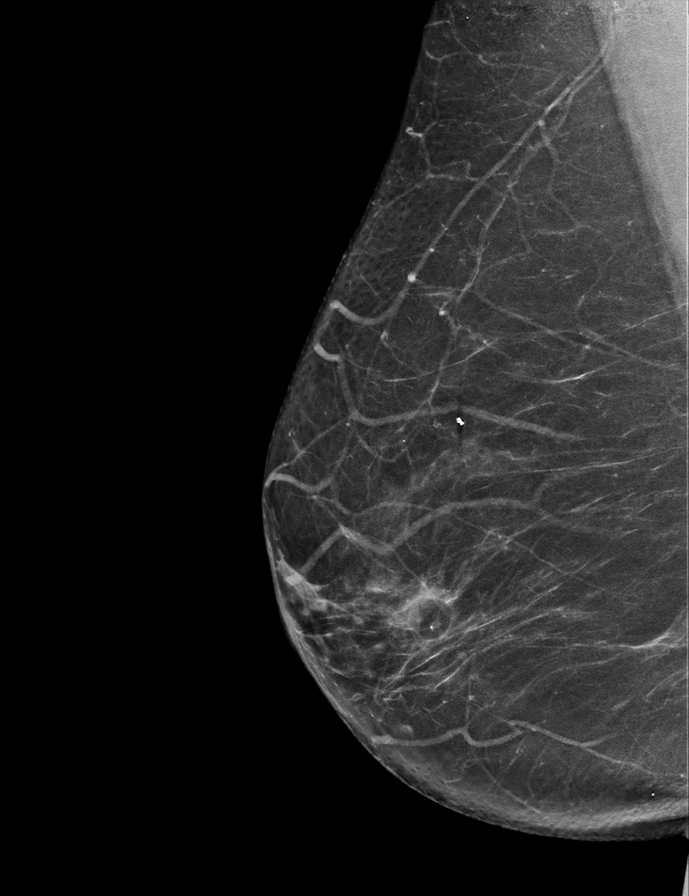

[L CC synth-2D]
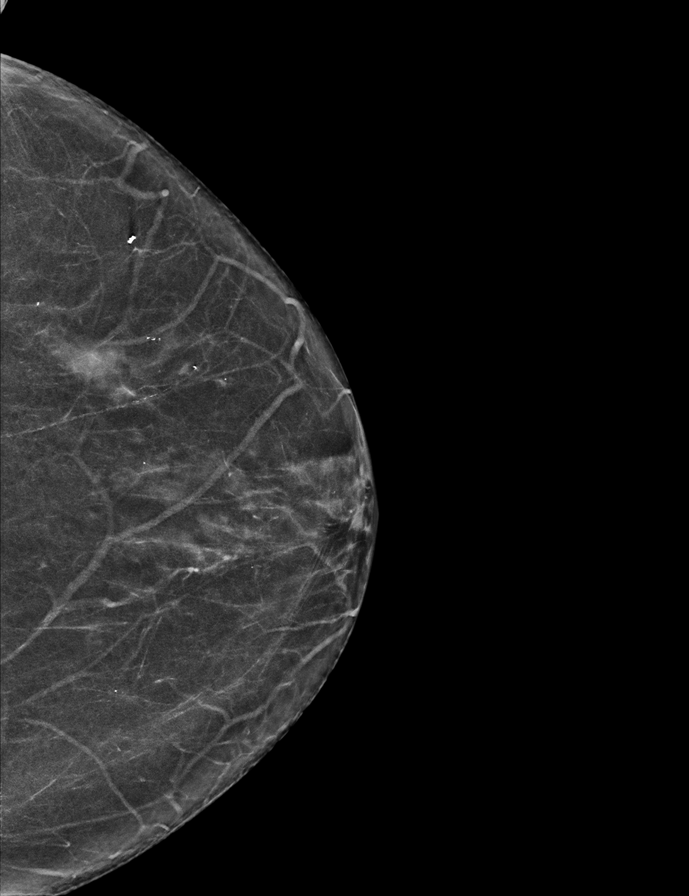

[R CC synth-2D]
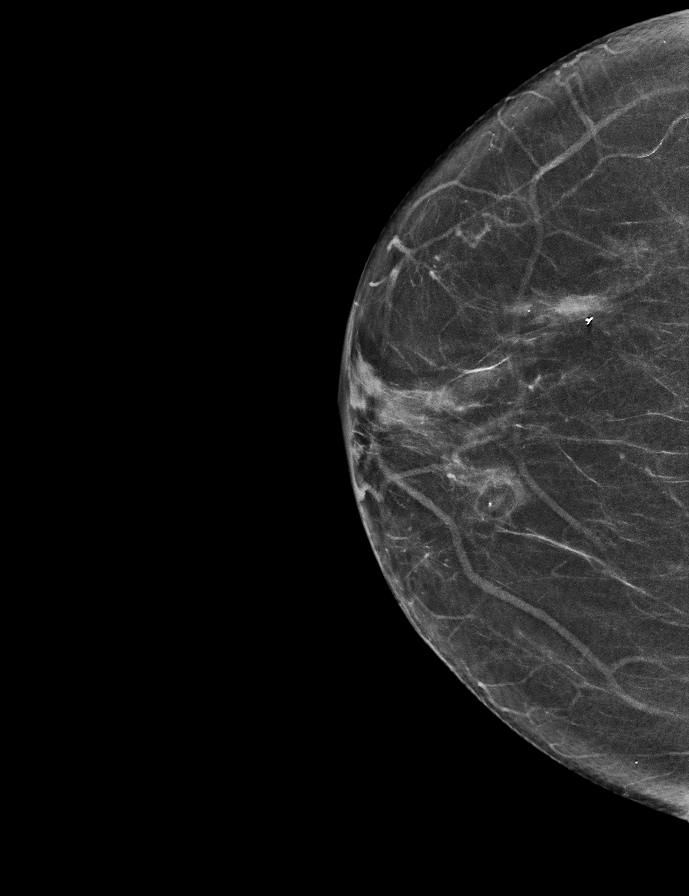

[L MLO synth-2D]
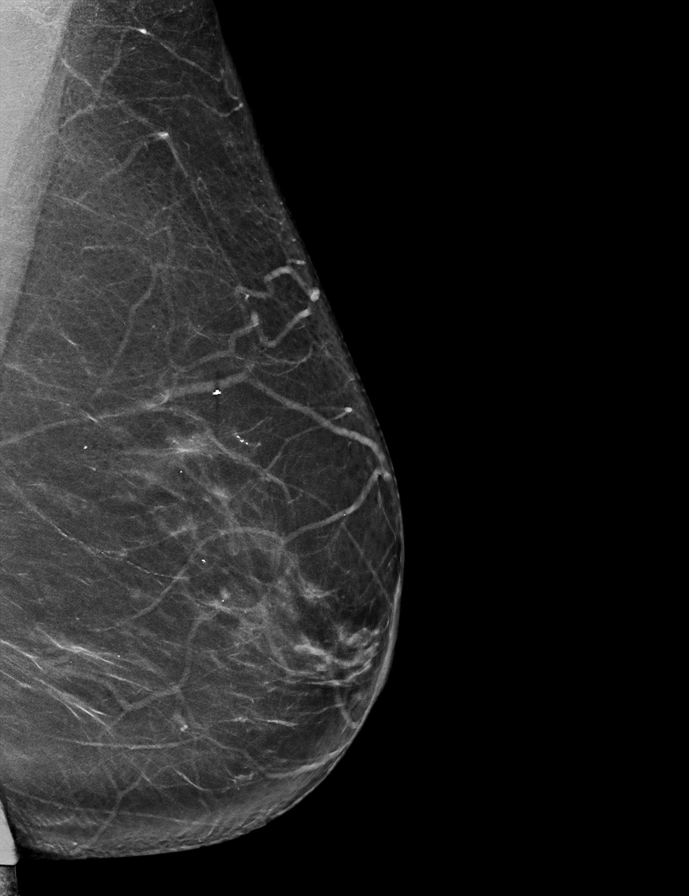

[R MLO tomo · 2 of 68 frames shown]
[frame 22/68]
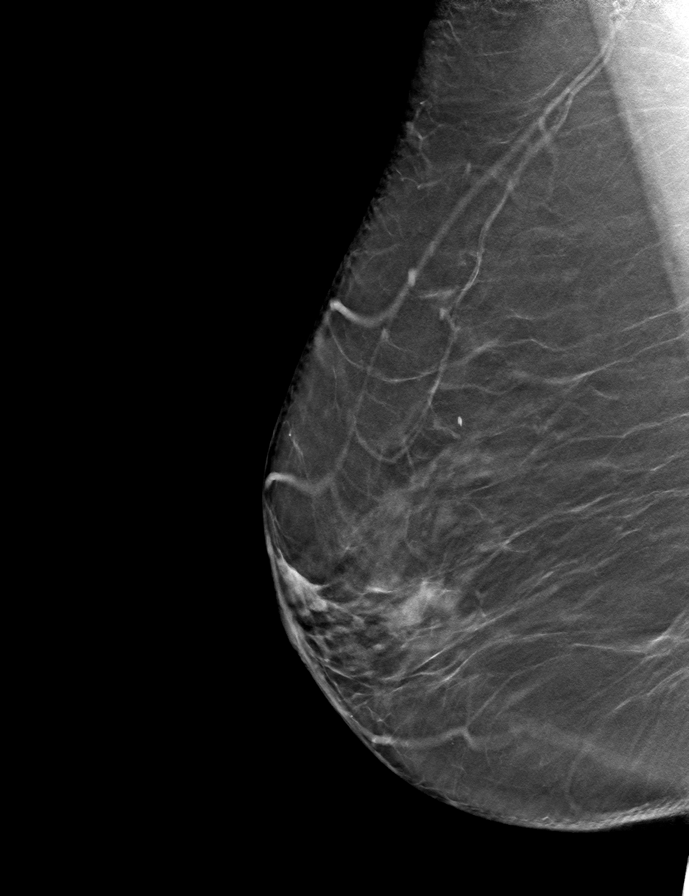
[frame 35/68]
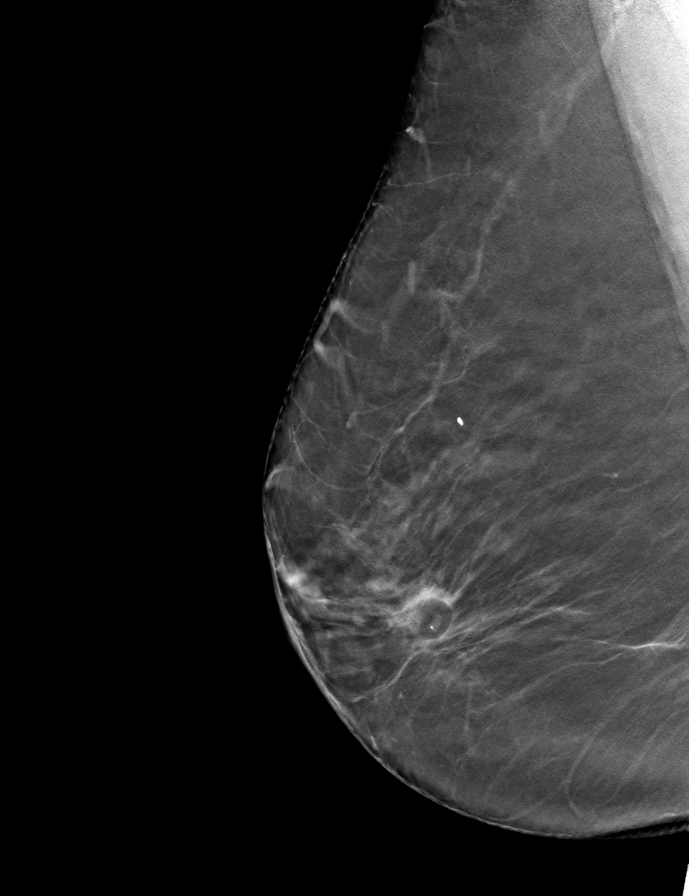

[L CC tomo · tomo slice 31/62.0]
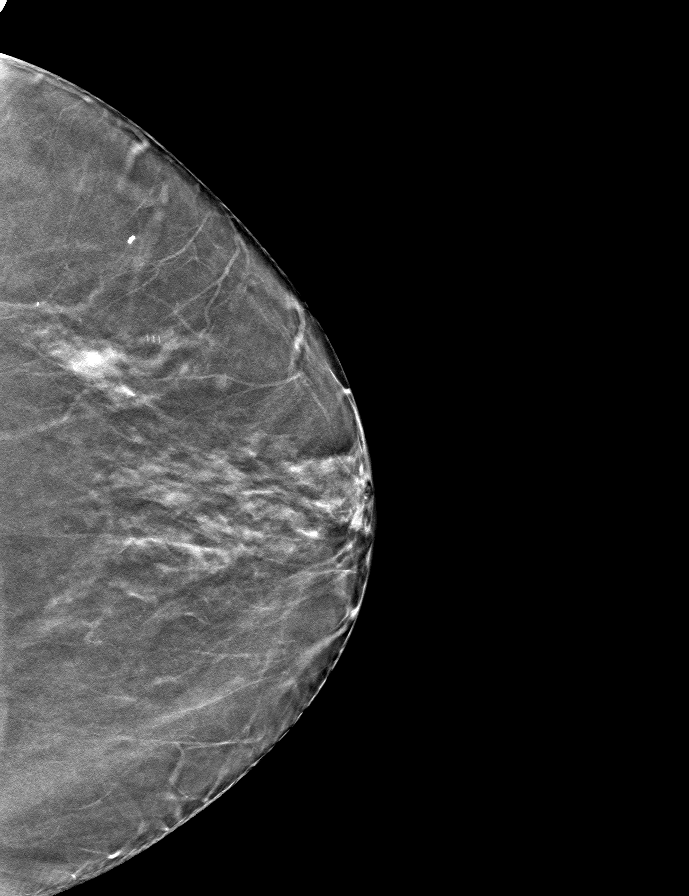

[L MLO tomo · tomo slice 38/75.0]
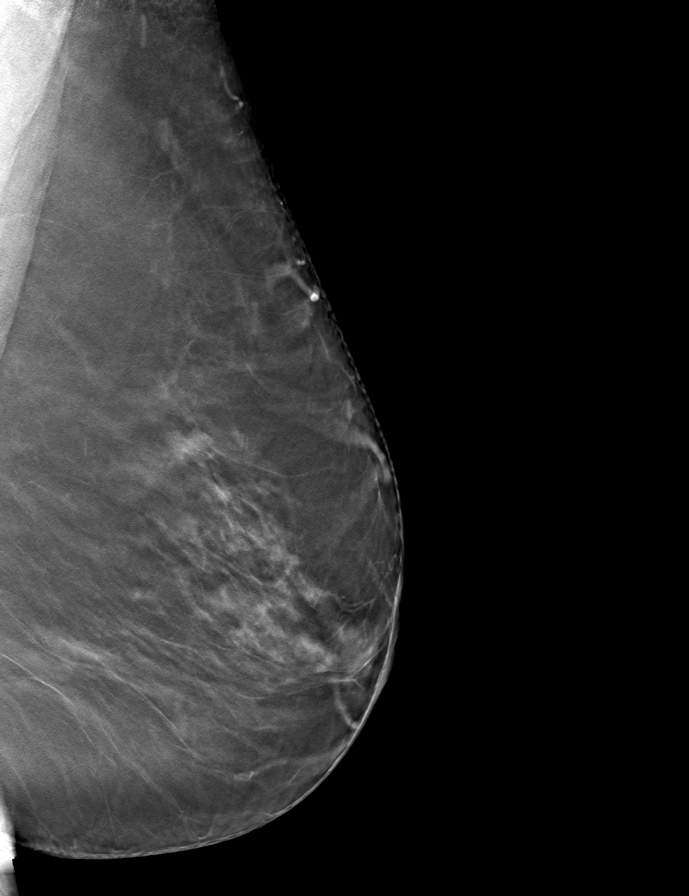

[R CC tomo · tomo slice 31/61.0]
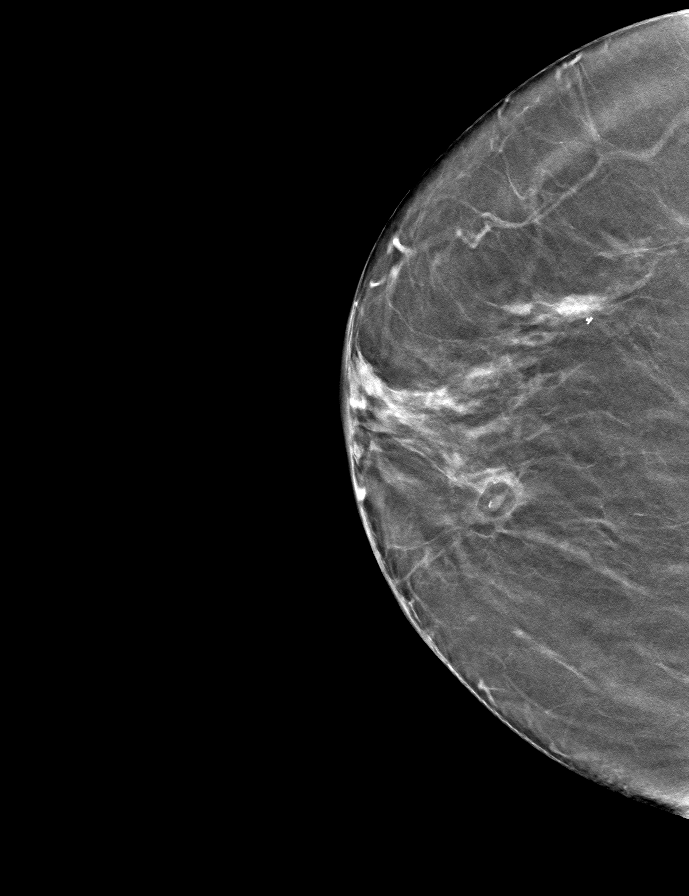

[9 of 24 positions shown; findings below may reference images not displayed]

ACR Breast Density Category b: There are scattered areas of
fibroglandular density.
FINDINGS: There are no findings suspicious for malignancy. Images were
processed with CAD.
IMPRESSION: No mammographic evidence of malignancy. A result letter of this
screening mammogram will be mailed directly to the patient.

RECOMMENDATION:
Screening mammogram in one year. (Code:[id number])

BI-RADS CATEGORY  1: Negative.

## 2018-07-22 ENCOUNTER — Ambulatory Visit
Admission: EM | Admit: 2018-07-22 | Discharge: 2018-07-22 | Disposition: A | Discharge status: Home / Self Care | Payer: Medicare Other | Attending: Family Medicine | Admitting: Family Medicine

## 2018-07-22 ENCOUNTER — Other Ambulatory Visit: Payer: Self-pay

## 2018-07-22 DIAGNOSIS — R05 Cough: Secondary | ICD-10-CM

## 2018-07-22 DIAGNOSIS — R059 Cough, unspecified: Secondary | ICD-10-CM

## 2018-07-22 MED ORDER — BENZONATATE 100 MG PO CAPS: 100.0000 mg | ORAL_CAPSULE | Freq: Three times a day (TID) | ORAL | 0 refills | Status: DC | PRN

## 2018-07-22 NOTE — ED Provider Notes (Signed)
[facility]    CSN: [id number] Arrival date & time: 07/22/18  5400  History   Chief Complaint Chief Complaint  Patient presents with  . Cough    APPT   HPI  68 year old female presenting cough.  Patient reports cough since Tuesday.  No documented fever.  No shortness of breath.  Granddaughter is also sick.  She has taken over-the-counter cough syrup with improvement.  No known exacerbating factors.  No other complaints.  PMH, Surgical Hx, Family Hx, Social History reviewed and updated as below.  Past Medical History:  Diagnosis Date  . Anxiety   . Arthritis   . Asthma   . Bronchitis 09/04/2016   urgent care  . Diabetes mellitus without complication ([place])   . Elevated C-reactive protein (CRP) 10/06/2016  . GERD (gastroesophageal reflux disease)   . History of shingles   . Hyperlipidemia   . Hypertension   . Neuralgia    Right side of head and face  . Shingles    chronic on Rt side of head and face    Patient Active Problem List   Diagnosis Date Noted  . Change in bowel habits 01/29/2018  . Rectal irritation 01/29/2018  . Postnasal drip 11/04/2017  . Other viral warts 11/04/2017  . Acute pain of both knees 08/07/2017  . Microalbuminuria 03/26/2017  . Primary osteoarthritis involving multiple joints 12/05/2016  . Status post total left knee replacement 12/01/2016  . Primary osteoarthritis of left knee 09/07/2016  . Hypertension 07/24/2016  . Occipital headache 01/21/2016  . Bilateral occipital neuralgia 01/21/2016  . Chronic pain syndrome 05/07/2015  . Gasserian ganglionitis 03/12/2015  . Neuropathic pain 03/12/2015  . Herpes zoster ophthalmicus 03/12/2015  . Anxiety 01/11/2015  . Diabetes ([place]) 01/11/2015  . Photopsia 01/11/2015  . Avitaminosis A 01/11/2015  . Depression 01/10/2015  . Morbid obesity ([place]) 04/18/2014  . Neuropathic postherpetic trigeminal neuralgia (Location of Primary Source of Pain) (Right) (V1) 12/21/2013  . Cannot sleep  03/13/2009  . Asthma 02/07/2009  . Calcium deficiency disease 10/16/2008  . Hypercholesterolemia without hypertriglyceridemia 07/06/2008  . Avitaminosis D 07/05/2008  . History of colon polyps 10/17/2005    Past Surgical History:  Procedure Laterality Date  . ABDOMINAL HYSTERECTOMY    . BREAST BIOPSY Right 2016   benign- tophat clip  . BREAST BIOPSY Left 04/08/2016   -benign-fibroadenomatous changes- tophat clip  . BREAST LUMPECTOMY Right 03/2016  . BREAST LUMPECTOMY WITH NEEDLE LOCALIZATION Right 05/02/2016   Procedure: BREAST LUMPECTOMY WITH NEEDLE LOCALIZATION;  Surgeon: Clayburn Pert, MD;  Location: [facility] ORS;  Service: General;  Laterality: Right;  . CHOLECYSTECTOMY    . KNEE ARTHROPLASTY Left 12/01/2016   Procedure: COMPUTER ASSISTED TOTAL KNEE ARTHROPLASTY;  Surgeon: Dereck Leep, MD;  Location: [facility] ORS;  Service: Orthopedics;  Laterality: Left;  . TOTAL KNEE ARTHROPLASTY Right 2014  . TOTAL KNEE ARTHROPLASTY Right 02/16/2013    OB History    Gravida  2   Para  2   Term      Preterm      AB      Living        SAB      TAB      Ectopic      Multiple      Live Births               Home Medications    Prior to Admission medications   Medication Sig Start Date End Date Taking? Authorizing Provider  atorvastatin (  LIPITOR) 40 MG tablet Take 1 tablet (40 mg total) by mouth daily. 01/29/18  Yes Bacigalupo, Dionne Bucy, MD  carbamazepine (TEGRETOL) 200 MG tablet Take 200 mg by mouth at bedtime.    Yes [provider]  hydrochlorothiazide (HYDRODIURIL) 12.5 MG tablet Take 1 tablet (12.5 mg total) by mouth daily. 01/29/18  Yes Bacigalupo, Dionne Bucy, MD  losartan (COZAAR) 100 MG tablet Take 1 tablet (100 mg total) by mouth daily. 01/29/18  Yes Bacigalupo, Dionne Bucy, MD  nortriptyline (PAMELOR) 75 MG capsule Take 1 capsule (75 mg total) by mouth at bedtime. 01/29/18  Yes Bacigalupo, Dionne Bucy, MD  omeprazole (PRILOSEC) 20 MG capsule Take 1 capsule (20 mg  total) by mouth daily. 04/09/18  Yes Bacigalupo, Dionne Bucy, MD  Vitamin D, Ergocalciferol, (DRISDOL) 50000 units CAPS capsule Take by mouth. 11/23/17  Yes [provider]  benzonatate (TESSALON) 100 MG capsule Take 1 capsule (100 mg total) by mouth 3 (three) times daily as needed. 07/22/18   Coral Spikes, DO  fluticasone (FLONASE) 50 MCG/ACT nasal spray Place 2 sprays into both nostrils daily. Patient not taking: Reported on 04/09/2018 11/04/17   Virginia Crews, MD  gabapentin (NEURONTIN) 600 MG tablet Take 1.5 tablets (900 mg total) by mouth every 6 (six) hours. 05/05/17 04/09/18  Vevelyn Francois, NP    Family History Family History  Problem Relation Age of Onset  . Anuerysm Mother        Brain  . Dementia Mother   . Heart failure Father   . CAD Father   . Dementia Father   . Diabetes Father   . Lupus Sister   . Diabetes Brother   . Throat cancer Maternal Grandfather   . Breast cancer Neg Hx     Social History Social History   Tobacco Use  . Smoking status: Never Smoker  . Smokeless tobacco: Never Used  Substance Use Topics  . Alcohol use: No    Alcohol/week: 0.0 standard drinks  . Drug use: No     Allergies   Lisinopril; Lovastatin; and Shellfish allergy   Review of Systems Review of Systems  Constitutional: Negative for fever.  Respiratory: Positive for cough.    Physical Exam Triage Vital Signs ED Triage Vitals [07/22/18 1015]  Enc Vitals Group     BP 122/86     Pulse Rate 95     Resp 18     Temp 98 F (36.7 C)     Temp Source Oral     SpO2 98 %     Weight 223 lb (101.2 kg)     Height 5\' 3"  (1.6 m)     Head Circumference      Peak Flow      Pain Score 6     Pain Loc      Pain Edu?      Excl. in [place]?    No data found.  Updated Vital Signs BP 122/86 (BP Location: Left Arm)   Pulse 95   Temp 98 F (36.7 C) (Oral)   Resp 18   Ht 5\' 3"  (1.6 m)   Wt 101.2 kg   SpO2 98%   BMI 39.50 kg/m   Visual Acuity Right Eye Distance:   Left  Eye Distance:   Bilateral Distance:    Right Eye Near:   Left Eye Near:    Bilateral Near:     Physical Exam Vitals signs and nursing note reviewed.  Constitutional:  General: She is not in acute distress.    Appearance: Normal appearance. She is obese.  HENT:     Head: Normocephalic and atraumatic.  Eyes:     General:        Right eye: No discharge.        Left eye: No discharge.     Conjunctiva/sclera: Conjunctivae normal.  Cardiovascular:     Rate and Rhythm: Normal rate and regular rhythm.  Pulmonary:     Effort: Pulmonary effort is normal.     Breath sounds: Normal breath sounds.  Neurological:     Mental Status: She is alert.  Psychiatric:        Mood and Affect: Mood normal.        Behavior: Behavior normal.    UC Treatments / Results  Labs (all labs ordered are listed, but only abnormal results are displayed) Labs Reviewed - No data to display  EKG None  Radiology No results found.  Procedures Procedures (including critical care time)  Medications Ordered in UC Medications - No data to display  Initial Impression / Assessment and Plan / UC Course  I have reviewed the triage vital signs and the nursing notes.  Pertinent labs & imaging results that were available during my care of the patient were reviewed by me and considered in my medical decision making (see chart for details).    68 year old female presents with cough.  Viral in origin.  Tessalon Perles for cough.  Supportive care.  Final Clinical Impressions(s) / UC Diagnoses   Final diagnoses:  Cough   Discharge Instructions   None    ED Prescriptions    Medication Sig Dispense Auth. Provider   benzonatate (TESSALON) 100 MG capsule Take 1 capsule (100 mg total) by mouth 3 (three) times daily as needed. 30 capsule Coral Spikes, DO     Controlled Substance Prescriptions [place] Controlled Substance Registry consulted? Not Applicable   Coral Spikes, DO 07/22/18 1150

## 2018-07-22 NOTE — ED Triage Notes (Signed)
Patient complains of cough and congestion since Tuesday.

## 2018-09-16 ENCOUNTER — Other Ambulatory Visit: Payer: Self-pay | Admitting: Family Medicine

## 2018-09-16 DIAGNOSIS — M792 Neuralgia and neuritis, unspecified: Secondary | ICD-10-CM

## 2018-09-16 DIAGNOSIS — B0229 Other postherpetic nervous system involvement: Secondary | ICD-10-CM

## 2018-09-16 NOTE — Telephone Encounter (Signed)
Pt needs a refill on  Gabapentin 600 mg  Losartan 100 mg  Hydrochlorothiazide 12.5  Atorvastatin 40 mg  Nortriptline 75 mg  Omeprazole 20 mg  [facility] mail order  CB#  [phone]  Thanks,  Con Memos

## 2018-09-16 NOTE — Telephone Encounter (Signed)
L.O.V. 04/09/2018, please advise.

## 2018-09-17 MED ORDER — OMEPRAZOLE 20 MG PO CPDR: 20.0000 mg | DELAYED_RELEASE_CAPSULE | Freq: Every day | ORAL | 3 refills | Status: DC

## 2018-09-17 MED ORDER — NORTRIPTYLINE HCL 75 MG PO CAPS: 75.0000 mg | ORAL_CAPSULE | Freq: Every day | ORAL | 3 refills | Status: DC

## 2018-09-17 MED ORDER — GABAPENTIN 600 MG PO TABS: 900.0000 mg | ORAL_TABLET | Freq: Four times a day (QID) | ORAL | 3 refills | Status: DC

## 2018-09-17 MED ORDER — LOSARTAN POTASSIUM 100 MG PO TABS: 100.0000 mg | ORAL_TABLET | Freq: Every day | ORAL | 3 refills | Status: DC

## 2018-09-17 MED ORDER — ATORVASTATIN CALCIUM 40 MG PO TABS: 40.0000 mg | ORAL_TABLET | Freq: Every day | ORAL | 3 refills | Status: DC

## 2018-09-17 MED ORDER — HYDROCHLOROTHIAZIDE 12.5 MG PO TABS: 12.5000 mg | ORAL_TABLET | Freq: Every day | ORAL | 3 refills | Status: DC

## 2018-10-11 ENCOUNTER — Ambulatory Visit: Payer: Medicare Other | Admitting: Family Medicine

## 2018-10-11 ENCOUNTER — Other Ambulatory Visit: Payer: Self-pay

## 2018-10-11 DIAGNOSIS — Z1231 Encounter for screening mammogram for malignant neoplasm of breast: Secondary | ICD-10-CM

## 2018-10-12 ENCOUNTER — Other Ambulatory Visit: Payer: Self-pay

## 2018-10-12 ENCOUNTER — Encounter: Payer: Self-pay | Admitting: Family Medicine

## 2018-10-12 ENCOUNTER — Ambulatory Visit (INDEPENDENT_AMBULATORY_CARE_PROVIDER_SITE_OTHER): Payer: Medicare Other | Admitting: Family Medicine

## 2018-10-12 VITALS — BP 122/74 | HR 95 | Temp 98.5°F | Wt 229.0 lb

## 2018-10-12 DIAGNOSIS — M21611 Bunion of right foot: Secondary | ICD-10-CM | POA: Insufficient documentation

## 2018-10-12 DIAGNOSIS — M21612 Bunion of left foot: Secondary | ICD-10-CM | POA: Diagnosis not present

## 2018-10-12 DIAGNOSIS — E1100 Type 2 diabetes mellitus with hyperosmolarity without nonketotic hyperglycemic-hyperosmolar coma (NKHHC): Secondary | ICD-10-CM

## 2018-10-12 LAB — POCT GLYCOSYLATED HEMOGLOBIN (HGB A1C): Hemoglobin A1C: 6.7 % — AB (ref 4.0–5.6)

## 2018-10-12 NOTE — Patient Instructions (Signed)
Bunion  A bunion is a bump on the base of the big toe that forms when the bones of the big toe joint move out of position. Bunions may be small at first, but they often get larger over time. They can make walking painful. What are the causes? A bunion may be caused by:  Wearing narrow or pointed shoes that force the big toe to press against the other toes.  Abnormal foot development that causes the foot to roll inward (pronate).  Changes in the foot that are caused by certain diseases, such as rheumatoid arthritis or polio.  A foot injury. What increases the risk? The following factors may make you more likely to develop this condition:  Wearing shoes that squeeze the toes together.  Having certain diseases, such as: ? Rheumatoid arthritis. ? Polio. ? Cerebral palsy.  Having family members who have bunions.  Being born with a foot deformity, such as flat feet or low arches.  Doing activities that put a lot of pressure on the feet, such as ballet dancing. What are the signs or symptoms? The main symptom of a bunion is a noticeable bump on the big toe. Other symptoms may include:  Pain.  Swelling around the big toe.  Redness and inflammation.  Thick or hardened skin on the big toe or between the toes.  Stiffness or loss of motion in the big toe.  Trouble with walking. How is this diagnosed? A bunion may be diagnosed based on your symptoms, medical history, and activities. You may have tests, such as:  X-rays. These allow your health care provider to check the position of the bones in your foot and look for damage to your joint. They also help your health care provider determine the severity of your bunion and the best way to treat it.  Joint aspiration. In this test, a sample of fluid is removed from the toe joint. This test may be done if you are in a lot of pain. It helps rule out diseases that cause painful swelling of the joints, such as arthritis. How is this  treated? Treatment depends on the severity of your symptoms. The goal of treatment is to relieve symptoms and prevent the bunion from getting worse. Your health care provider may recommend:  Wearing shoes that have a wide toe box.  Using bunion pads to cushion the affected area.  Taping your toes together to keep them in a normal position.  Placing a device inside your shoe (orthotics) to help reduce pressure on your toe joint.  Taking medicine to ease pain, inflammation, and swelling.  Applying heat or ice to the affected area.  Doing stretching exercises.  Surgery to remove scar tissue and move the toes back into their normal position. This treatment is rare. Follow these instructions at home: Managing pain, stiffness, and swelling   If directed, put ice on the painful area: ? Put ice in a plastic bag. ? Place a towel between your skin and the bag. ? Leave the ice on for 20 minutes, 2-3 times a day. Activity   If directed, apply heat to the affected area before you exercise. Use the heat source that your health care provider recommends, such as a moist heat pack or a heating pad. ? Place a towel between your skin and the heat source. ? Leave the heat on for 20-30 minutes. ? Remove the heat if your skin turns bright red. This is especially important if you are unable to feel pain,   heat, or cold. You may have a greater risk of getting burned.  Do exercises as told by your health care provider. General instructions  Support your toe joint with proper footwear, shoe padding, or taping as told by your health care provider.  Take over-the-counter and prescription medicines only as told by your health care provider.  Keep all follow-up visits as told by your health care provider. This is important. Contact a health care provider if your symptoms:  Get worse.  Do not improve in 2 weeks. Get help right away if you have:  Severe pain and trouble with walking. Summary  A  bunion is a bump on the base of the big toe that forms when the bones of the big toe joint move out of position.  Bunions can make walking painful.  Treatment depends on the severity of your symptoms.  Support your toe joint with proper footwear, shoe padding, or taping as told by your health care provider. This information is not intended to replace advice given to you by your health care provider. Make sure you discuss any questions you have with your health care provider. Document Released: 06/02/2005 Document Revised: 10/13/2017 Document Reviewed: 10/13/2017 Elsevier Interactive Patient Education  2019 Elsevier Inc.  

## 2018-10-12 NOTE — Progress Notes (Signed)
[name]  MRN: [id number] DOB: 1951/05/27  Subjective:  HPI   The patient is a 68 year old female who presents for follow up of diabetes only.  She is a patient of Dr Brita Romp and did not want to wait on her appointment until Dr B could see her in the office for the check of her A1C. She does not check her glucose at home but reports no signs or symptoms to indicate to her any hypoglycemic events.  Patient Active Problem List   Diagnosis Date Noted  . Change in bowel habits 01/29/2018  . Rectal irritation 01/29/2018  . Postnasal drip 11/04/2017  . Other viral warts 11/04/2017  . Acute pain of both knees 08/07/2017  . Microalbuminuria 03/26/2017  . Primary osteoarthritis involving multiple joints 12/05/2016  . Status post total left knee replacement 12/01/2016  . Primary osteoarthritis of left knee 09/07/2016  . Hypertension 07/24/2016  . Occipital headache 01/21/2016  . Bilateral occipital neuralgia 01/21/2016  . Chronic pain syndrome 05/07/2015  . Gasserian ganglionitis 03/12/2015  . Neuropathic pain 03/12/2015  . Herpes zoster ophthalmicus 03/12/2015  . Anxiety 01/11/2015  . Diabetes ([place]) 01/11/2015  . Photopsia 01/11/2015  . Avitaminosis A 01/11/2015  . Depression 01/10/2015  . Morbid obesity ([place]) 04/18/2014  . Neuropathic postherpetic trigeminal neuralgia (Location of Primary Source of Pain) (Right) (V1) 12/21/2013  . Cannot sleep 03/13/2009  . Asthma 02/07/2009  . Calcium deficiency disease 10/16/2008  . Hypercholesterolemia without hypertriglyceridemia 07/06/2008  . Avitaminosis D 07/05/2008  . History of colon polyps 10/17/2005    Past Medical History:  Diagnosis Date  . Anxiety   . Arthritis   . Asthma   . Bronchitis 09/04/2016   urgent care  . Diabetes mellitus without complication ([place])   . Elevated C-reactive protein (CRP) 10/06/2016  . GERD (gastroesophageal reflux disease)   . History of shingles   . Hyperlipidemia   . Hypertension   .  Neuralgia    Right side of head and face  . Shingles    chronic on Rt side of head and face   Past Surgical History:  Procedure Laterality Date  . ABDOMINAL HYSTERECTOMY    . BREAST BIOPSY Right 2016   benign- tophat clip  . BREAST BIOPSY Left 04/08/2016   -benign-fibroadenomatous changes- tophat clip  . BREAST LUMPECTOMY Right 03/2016  . BREAST LUMPECTOMY WITH NEEDLE LOCALIZATION Right 05/02/2016   Procedure: BREAST LUMPECTOMY WITH NEEDLE LOCALIZATION;  Surgeon: Clayburn Pert, MD;  Location: [facility] ORS;  Service: General;  Laterality: Right;  . CHOLECYSTECTOMY    . KNEE ARTHROPLASTY Left 12/01/2016   Procedure: COMPUTER ASSISTED TOTAL KNEE ARTHROPLASTY;  Surgeon: Dereck Leep, MD;  Location: [facility] ORS;  Service: Orthopedics;  Laterality: Left;  . TOTAL KNEE ARTHROPLASTY Right 2014  . TOTAL KNEE ARTHROPLASTY Right 02/16/2013   Family History  Problem Relation Age of Onset  . Anuerysm Mother        Brain  . Dementia Mother   . Heart failure Father   . CAD Father   . Dementia Father   . Diabetes Father   . Lupus Sister   . Diabetes Brother   . Throat cancer Maternal Grandfather   . Breast cancer Neg Hx    Social History   Socioeconomic History  . Marital status: Widowed    Spouse name: Not on file  . Number of children: 2  . Years of education: Not on file  . Highest education level: High school graduate  Occupational History  . Occupation: retired  Scientific laboratory technician  . Financial resource strain: Not hard at all  . Food insecurity:    Worry: Never true    Inability: Never true  . Transportation needs:    Medical: No    Non-medical: No  Tobacco Use  . Smoking status: Never Smoker  . Smokeless tobacco: Never Used  Substance and Sexual Activity  . Alcohol use: No    Alcohol/week: 0.0 standard drinks  . Drug use: No  . Sexual activity: Not on file  Lifestyle  . Physical activity:    Days per week: 2 days    Minutes per session: 70 min  . Stress: Not at all   Relationships  . Social connections:    Talks on phone: Patient refused    Gets together: Patient refused    Attends religious service: Patient refused    Active member of club or organization: Patient refused    Attends meetings of clubs or organizations: Patient refused    Relationship status: Patient refused  . Intimate partner violence:    Fear of current or ex partner: Patient refused    Emotionally abused: Patient refused    Physically abused: Patient refused    Forced sexual activity: Patient refused  Other Topics Concern  . Not on file  Social History Narrative  . Not on file   Outpatient Encounter Medications as of 10/12/2018  Medication Sig  . atorvastatin (LIPITOR) 40 MG tablet Take 1 tablet (40 mg total) by mouth daily.  . carbamazepine (TEGRETOL) 200 MG tablet Take 200 mg by mouth at bedtime.   . gabapentin (NEURONTIN) 600 MG tablet Take 1.5 tablets (900 mg total) by mouth every 6 (six) hours.  . hydrochlorothiazide (HYDRODIURIL) 12.5 MG tablet Take 1 tablet (12.5 mg total) by mouth daily.  Marland Kitchen losartan (COZAAR) 100 MG tablet Take 1 tablet (100 mg total) by mouth daily.  . nortriptyline (PAMELOR) 75 MG capsule Take 1 capsule (75 mg total) by mouth at bedtime.  Marland Kitchen omeprazole (PRILOSEC) 20 MG capsule Take 1 capsule (20 mg total) by mouth daily.  . fluticasone (FLONASE) 50 MCG/ACT nasal spray Place 2 sprays into both nostrils daily. (Patient not taking: Reported on 04/09/2018)  . Vitamin D, Ergocalciferol, (DRISDOL) 50000 units CAPS capsule Take by mouth.  . [DISCONTINUED] benzonatate (TESSALON) 100 MG capsule Take 1 capsule (100 mg total) by mouth 3 (three) times daily as needed.   No facility-administered encounter medications on file as of 10/12/2018.     Allergies  Allergen Reactions  . Lisinopril Cough  . Lovastatin Other (See Comments)    Myalgia  . Shellfish Allergy Rash   Review of Systems  Constitutional: Negative for fever.  Respiratory: Negative for cough  and shortness of breath.   Cardiovascular: Negative for chest pain and palpitations.  Genitourinary: Negative for frequency.  Endo/Heme/Allergies: Negative for polydipsia.    Objective:  BP 122/74 (BP Location: Right Arm, Patient Position: Sitting, Cuff Size: Large)   Pulse 95   Temp 98.5 F (36.9 C) (Oral)   Wt 229 lb (103.9 kg)   SpO2 99%   BMI 40.57 kg/m   Wt Readings from Last 3 Encounters:  10/12/18 229 lb (103.9 kg)  07/22/18 223 lb (101.2 kg)  04/09/18 229 lb 3.2 oz (104 kg)    Physical Exam  Constitutional: She is oriented to person, place, and time and well-developed, well-nourished, and in no distress.  HENT:  Head: Normocephalic.  Right Ear: External ear normal.  Left Ear: External ear normal.  Nose: Nose normal.  Mouth/Throat: Oropharynx is clear and moist.  Eyes: Conjunctivae are normal.  Neck: Neck supple.  Cardiovascular: Normal rate and regular rhythm.  Pulmonary/Chest: Effort normal and breath sounds normal.  Abdominal: Soft. Bowel sounds are normal.  Musculoskeletal: Normal range of motion.     Comments: Some callus formation ball of each foot and 1st MTP bunions bilaterally. Normal sensation to nylon string test.  Lymphadenopathy:    She has no cervical adenopathy.  Neurological: She is alert and oriented to person, place, and time.  Skin: No rash noted.  Psychiatric: Mood, affect and judgment normal.   Diabetic Foot Form - Detailed   Diabetic Foot Exam - detailed Diabetic Foot exam was performed with the following findings:  Yes 10/12/2018 10:06 AM  Visual Foot Exam completed.:  Yes  Can the patient see the bottom of their feet?:  Yes Are the shoes appropriate in style and fit?:  Yes Is there swelling or and abnormal foot shape?:  No Is there a claw toe deformity?:  No Is there elevated skin temparature?:  No Is there foot or ankle muscle weakness?:  No Normal Range of Motion:  Yes Pulse Foot Exam completed.:  Yes  Right posterior Tibialias:   Present Left posterior Tibialias:  Present  Right Dorsalis Pedis:  Present Left Dorsalis Pedis:  Present  Sensory Foot Exam Completed.:  Yes Semmes-Weinstein Monofilament Test R Site 1-Great Toe:  Pos L Site 1-Great Toe:  Pos    Comments:  Bilateral 1st MTP bunions and callus formation plantar surface over metatarsal heads of both feet.      Assessment and Plan :  1. Type 2 diabetes mellitus with hyperosmolarity without coma, without long-term current use of insulin ([place]) Feeling very well today. Hgb A1C 6.7% today. No significant polyuria, polydipsia but has gained some weight the past 2-3 months during the pandemic restrictions. Continues to try to maintain diabetic diet but could use more exercise walking in her yard now. Does not check FBS at home. Refused any additional labs today. Recommend she maintain regular follow up in 4-6 months. - POCT glycosylated hemoglobin (Hb A1C)  2. Bilateral bunions Some discomfort in bilateral bunions and callus formation ball of both feet. Wants to discuss with Dr. Marry Guan (orthopedist) before referral to podiatrist. Normal sensation and pulses.

## 2018-12-27 ENCOUNTER — Ambulatory Visit
Admission: RE | Admit: 2018-12-27 | Discharge: 2018-12-27 | Disposition: A | Discharge status: Home / Self Care | Payer: Medicare Other | Source: Ambulatory Visit | Attending: Surgery | Admitting: Surgery

## 2018-12-27 ENCOUNTER — Other Ambulatory Visit: Payer: Self-pay

## 2018-12-27 DIAGNOSIS — Z1231 Encounter for screening mammogram for malignant neoplasm of breast: Secondary | ICD-10-CM | POA: Diagnosis not present

## 2019-01-05 DIAGNOSIS — B0229 Other postherpetic nervous system involvement: Secondary | ICD-10-CM | POA: Diagnosis not present

## 2019-01-06 ENCOUNTER — Ambulatory Visit: Payer: Medicare Other | Admitting: Surgery

## 2019-01-10 ENCOUNTER — Other Ambulatory Visit: Payer: Self-pay

## 2019-01-10 ENCOUNTER — Ambulatory Visit (INDEPENDENT_AMBULATORY_CARE_PROVIDER_SITE_OTHER): Payer: Medicare Other | Admitting: General Surgery

## 2019-01-10 ENCOUNTER — Encounter: Payer: Self-pay | Admitting: General Surgery

## 2019-01-10 VITALS — BP 140/82 | HR 74 | Temp 98.1°F | Ht 63.0 in | Wt 231.0 lb

## 2019-01-10 DIAGNOSIS — Z9889 Other specified postprocedural states: Secondary | ICD-10-CM

## 2019-01-10 NOTE — Patient Instructions (Signed)
Return to her PCP for Mammograms and breast checks. The patient is aware to call back for any questions or concerns.

## 2019-01-10 NOTE — Progress Notes (Signed)
Patient ID: NYIA TSAO, female   DOB: 1951/05/07, 68 y.o.   MRN: [id number]  Chief Complaint  Patient presents with   Follow-up    mammogram     HPI [name] is a 68 y.o. female.  She was previously seen in this office by Dr. Adonis Huguenin.  He performed a wire localized biopsy of an abnormal finding on mammogram in 2017.  This was benign on final pathology.  Follow-up mammogram about 6 months later showed some calcifications in the left breast.  These were benign on biopsy.  Her subsequent imaging has been benign.  She was last seen 1 year ago by Dr. Tama High.  She had a mammogram performed on December 28, 2018.  The results are copied here:  CLINICAL DATA:  Screening.  EXAM: DIGITAL SCREENING BILATERAL MAMMOGRAM WITH TOMO AND CAD  COMPARISON:  Previous exam(s).  ACR Breast Density Category c: The breast tissue is heterogeneously dense, which may obscure small masses.  FINDINGS: There are no findings suspicious for malignancy. Images were processed with CAD.  IMPRESSION: No mammographic evidence of malignancy. A result letter of this screening mammogram will be mailed directly to the patient.  RECOMMENDATION: Screening mammogram in one year. (Code:SM-B-01Y)  BI-RADS CATEGORY  1: Negative.  [name] states that she has not noticed any new lumps or bumps in either breast.  She denies any nipple discharge.  She states that she does perform breast self exams.   Past Medical History:  Diagnosis Date   Anxiety    Arthritis    Asthma    Bronchitis 09/04/2016   urgent care   Diabetes mellitus without complication ([facility])    Elevated C-reactive protein (CRP) 10/06/2016   GERD (gastroesophageal reflux disease)    History of shingles    Hyperlipidemia    Hypertension    Neuralgia    Right side of head and face   Shingles    chronic on Rt side of head and face    Past Surgical History:  Procedure Laterality Date   ABDOMINAL HYSTERECTOMY      BREAST BIOPSY Right 2016   benign- tophat clip   BREAST BIOPSY Left 04/08/2016   -benign-fibroadenomatous changes- tophat clip   BREAST LUMPECTOMY Right 03/2016   BREAST LUMPECTOMY WITH NEEDLE LOCALIZATION Right 05/02/2016   Procedure: BREAST LUMPECTOMY WITH NEEDLE LOCALIZATION;  Surgeon: Clayburn Pert, MD;  Location: [facility] ORS;  Service: General;  Laterality: Right;   CHOLECYSTECTOMY     KNEE ARTHROPLASTY Left 12/01/2016   Procedure: COMPUTER ASSISTED TOTAL KNEE ARTHROPLASTY;  Surgeon: Dereck Leep, MD;  Location: [facility] ORS;  Service: Orthopedics;  Laterality: Left;   TOTAL KNEE ARTHROPLASTY Right 2014   TOTAL KNEE ARTHROPLASTY Right 02/16/2013    Family History  Problem Relation Age of Onset   Anuerysm Mother        Brain   Dementia Mother    Heart failure Father    CAD Father    Dementia Father    Diabetes Father    Lupus Sister    Diabetes Brother    Throat cancer Maternal Grandfather    Breast cancer Neg Hx     Social History Social History   Tobacco Use   Smoking status: Never Smoker   Smokeless tobacco: Never Used  Substance Use Topics   Alcohol use: No    Alcohol/week: 0.0 standard drinks   Drug use: No    Allergies  Allergen Reactions   Lisinopril Cough   Lovastatin Other (See  Comments)    Myalgia   Shellfish Allergy Rash    Current Outpatient Medications  Medication Sig Dispense Refill   atorvastatin (LIPITOR) 40 MG tablet Take 1 tablet (40 mg total) by mouth daily. 90 tablet 3   carbamazepine (TEGRETOL) 200 MG tablet Take 200 mg by mouth at bedtime.      hydrochlorothiazide (HYDRODIURIL) 12.5 MG tablet Take 1 tablet (12.5 mg total) by mouth daily. 90 tablet 3   losartan (COZAAR) 100 MG tablet Take 1 tablet (100 mg total) by mouth daily. 90 tablet 3   nortriptyline (PAMELOR) 75 MG capsule Take 1 capsule (75 mg total) by mouth at bedtime. 90 capsule 3   omeprazole (PRILOSEC) 20 MG capsule Take 1 capsule (20 mg total) by  mouth daily. 90 capsule 3   Vitamin D, Ergocalciferol, (DRISDOL) 50000 units CAPS capsule Take by mouth.     gabapentin (NEURONTIN) 600 MG tablet Take 1.5 tablets (900 mg total) by mouth every 6 (six) hours. 540 tablet 3   No current facility-administered medications for this visit.     Review of Systems Review of Systems  All other systems reviewed and are negative.   Blood pressure 140/82, pulse 74, temperature 98.1 F (36.7 C), temperature source Skin, height 5\' 3"  (1.6 m), weight 231 lb (104.8 kg), SpO2 96 %.  Physical Exam Physical Exam Vitals signs reviewed.  Constitutional:      General: She is not in acute distress.    Appearance: Normal appearance. She is obese.  HENT:     Head: Normocephalic and atraumatic.     Nose:     Comments: Covered with a mask secondary to COVID-19 precautions Eyes:     General: No scleral icterus. Neck:     Musculoskeletal: Normal range of motion and neck supple.  Cardiovascular:     Rate and Rhythm: Normal rate and regular rhythm.     Pulses: Normal pulses.     Heart sounds: No murmur.  Pulmonary:     Effort: Pulmonary effort is normal.     Breath sounds: Normal breath sounds.  Chest:     Breasts:        Right: Normal.        Left: Normal.       Comments: Scar from prior lumpectomy, no other masses or concerning findings. Abdominal:     General: Bowel sounds are normal.     Palpations: Abdomen is soft.  Lymphadenopathy:     Cervical: No cervical adenopathy.     Upper Body:     Right upper body: No supraclavicular, axillary or pectoral adenopathy.     Left upper body: No supraclavicular, axillary or pectoral adenopathy.  Neurological:     Mental Status: She is alert.     Data Reviewed I reviewed the history of mammograms with the findings discussed in the history of present illness.  I also reviewed her operative note from Dr. Adonis Huguenin as well as the pathology from both of her more recent biopsies.  These demonstrate benign  findings and can be found in the results section of the electronic medical record.  Surgical Pathology  CASE: [id number]  PATIENT: [facility]  Surgical Pathology Report      SPECIMEN SUBMITTED:  A. Breast, left UOQ   CLINICAL HISTORY:  Left breast calcifications   PRE-OPERATIVE DIAGNOSIS:  Fibroadenomatous calcifications vs DCIS   POST-OPERATIVE DIAGNOSIS:  None provided.      DIAGNOSIS:  A. BREAST, LEFT UPPER-OUTER QUADRANT; STEREOTACTIC-GUIDED CORE BIOPSY:  -  FIBROADENOMATOUS CHANGES WITH STROMAL HYALINIZATION AND  CALCIFICATIONS.  - NEGATIVE FOR ATYPIA AND MALIGNANCY  Surgical Pathology  CASE: [id number]  PATIENT: [name]  Surgical Pathology Report      SPECIMEN SUBMITTED:  A. Breast mass, right   CLINICAL HISTORY:  None provided   PRE-OPERATIVE DIAGNOSIS:  Mass right breast   POST-OPERATIVE DIAGNOSIS:  Same as pre-op      DIAGNOSIS:  A. BREAST MASS, RIGHT; NEEDLE LOCALIZED EXCISION:  - BIOPSY CAVITY.  - COLUMNAR CELL CHANGE.  - NEGATIVE FOR RESIDUAL NEOPLASM.  - NEGATIVE FOR ATYPIA AND MALIGNANCY.  - THE SPECIMEN WAS ENTIRELY SUBMITTED FOR MICROSCOPIC EXAMINATION.   Assessment This is a 68 year old woman who has had a number of breast biopsies.  She has never had breast cancer.  She has had 2 BI-RADS 1 mammograms.  At this time, she does not need ongoing surgical follow-up.  Plan We will return [name] to the care of her usual providers.  She should continue to perform self exams on a monthly basis.  She should have annual screening mammography.  This can be ordered and monitored by her primary care doctor.  Should there be any new suspicious findings, she is welcome to return to our office at any time.    Fredirick Maudlin 01/10/2019, 10:27 AM

## 2019-01-13 DIAGNOSIS — M170 Bilateral primary osteoarthritis of knee: Secondary | ICD-10-CM | POA: Diagnosis not present

## 2019-01-13 DIAGNOSIS — Z96653 Presence of artificial knee joint, bilateral: Secondary | ICD-10-CM | POA: Diagnosis not present

## 2019-01-25 DIAGNOSIS — Z20828 Contact with and (suspected) exposure to other viral communicable diseases: Secondary | ICD-10-CM | POA: Diagnosis not present

## 2019-03-16 ENCOUNTER — Other Ambulatory Visit: Payer: Self-pay

## 2019-03-16 DIAGNOSIS — Z20822 Contact with and (suspected) exposure to covid-19: Secondary | ICD-10-CM

## 2019-03-17 LAB — NOVEL CORONAVIRUS, NAA: SARS-CoV-2, NAA: NOT DETECTED

## 2019-04-05 ENCOUNTER — Other Ambulatory Visit: Payer: Self-pay

## 2019-04-05 ENCOUNTER — Ambulatory Visit (INDEPENDENT_AMBULATORY_CARE_PROVIDER_SITE_OTHER): Payer: Medicare Other

## 2019-04-05 DIAGNOSIS — Z23 Encounter for immunization: Secondary | ICD-10-CM

## 2019-04-12 NOTE — Progress Notes (Signed)
Subjective:   [name] is a 68 y.o. female who presents for Medicare Annual (Subsequent) preventive examination.    This visit is being conducted through telemedicine due to the COVID-19 pandemic. This patient has given me verbal consent via doximity to conduct this visit, patient states they are participating from their home address. Some vital signs may be absent or patient reported.    Patient identification: identified by name, DOB, and current address  Review of Systems:  N/A  Cardiac Risk Factors include: advanced age (>49men, >37 women);diabetes mellitus;dyslipidemia;hypertension;obesity (BMI >30kg/m2)     Objective:     Vitals: There were no vitals taken for this visit.  There is no height or weight on file to calculate BMI. Unable to obtain vitals due to visit being conducted via telephonically.   Advanced Directives 04/13/2019 07/22/2018 04/09/2018 10/28/2017 04/27/2017 02/18/2017 12/01/2016  Does Patient Have a Medical Advance Directive? Yes No Yes Yes Yes No Yes  Type of Paramedic of [place];Living will - [place];Living will Living will Living will - -  Does patient want to make changes to medical advance directive? - - - - - - -  Copy of [place] in Chart? No - copy requested - No - copy requested - - - -  Would patient like information on creating a medical advance directive? - - - - - No - Patient declined -    Tobacco Social History   Tobacco Use  Smoking Status Never Smoker  Smokeless Tobacco Never Used     Counseling given: Not Answered   Clinical Intake:  Pre-visit preparation completed: Yes  Pain : No/denies pain Pain Score: 0-No pain     Nutritional Risks: None Diabetes: Yes  How often do you need to have someone help you when you read instructions, pamphlets, or other written materials from your doctor or pharmacy?: 1 - Never   Diabetes:  Is the patient diabetic?  Yes  type 2 If diabetic, was a CBG obtained today?  No  Did the patient bring in their glucometer from home?  No  How often do you monitor your CBG's? Does not check BS at home.   Financial Strains and Diabetes Management:  Are you having any financial strains with the device, your supplies or your medication? No .  Does the patient want to be seen by Chronic Care Management for management of their diabetes?  No  Would the patient like to be referred to a Nutritionist or for Diabetic Management?  No   Diabetic Exams:  Diabetic Eye Exam: Completed 02/19/18. Overdue for diabetic eye exam. Pt has been advised about the importance in completing this exam.   Diabetic Foot Exam: Completed 10/12/18. Repeat yearly.    Interpreter Needed?: No  Information entered by :: [facility], LPN  Past Medical History:  Diagnosis Date  . Anxiety   . Arthritis   . Asthma   . Bronchitis 09/04/2016   urgent care  . Diabetes mellitus without complication ([place])   . Elevated C-reactive protein (CRP) 10/06/2016  . GERD (gastroesophageal reflux disease)   . History of shingles   . Hyperlipidemia   . Hypertension   . Neuralgia    Right side of head and face  . Shingles    chronic on Rt side of head and face   Past Surgical History:  Procedure Laterality Date  . ABDOMINAL HYSTERECTOMY    . BREAST BIOPSY Right 2016   benign- tophat  clip  . BREAST BIOPSY Left 04/08/2016   -benign-fibroadenomatous changes- tophat clip  . BREAST LUMPECTOMY Right 03/2016  . BREAST LUMPECTOMY WITH NEEDLE LOCALIZATION Right 05/02/2016   Procedure: BREAST LUMPECTOMY WITH NEEDLE LOCALIZATION;  Surgeon: Clayburn Pert, MD;  Location: [facility] ORS;  Service: General;  Laterality: Right;  . CHOLECYSTECTOMY    . KNEE ARTHROPLASTY Left 12/01/2016   Procedure: COMPUTER ASSISTED TOTAL KNEE ARTHROPLASTY;  Surgeon: Dereck Leep, MD;  Location: [facility] ORS;  Service: Orthopedics;  Laterality: Left;  . TOTAL KNEE ARTHROPLASTY Right 2014  .  TOTAL KNEE ARTHROPLASTY Right 02/16/2013   Family History  Problem Relation Age of Onset  . Anuerysm Mother        Brain  . Dementia Mother   . Diabetes Mother   . Heart failure Father   . CAD Father   . Dementia Father   . Diabetes Father   . Lupus Sister   . Throat cancer Maternal Grandfather   . Diabetes Brother   . Breast cancer Neg Hx    Social History   Socioeconomic History  . Marital status: Widowed    Spouse name: Not on file  . Number of children: 2  . Years of education: Not on file  . Highest education level: High school graduate  Occupational History  . Occupation: retired  Scientific laboratory technician  . Financial resource strain: Not hard at all  . Food insecurity    Worry: Never true    Inability: Never true  . Transportation needs    Medical: No    Non-medical: No  Tobacco Use  . Smoking status: Never Smoker  . Smokeless tobacco: Never Used  Substance and Sexual Activity  . Alcohol use: No    Alcohol/week: 0.0 standard drinks  . Drug use: No  . Sexual activity: Not on file  Lifestyle  . Physical activity    Days per week: 0 days    Minutes per session: 0 min  . Stress: Only a little  Relationships  . Social Herbalist on phone: Patient refused    Gets together: Patient refused    Attends religious service: Patient refused    Active member of club or organization: Patient refused    Attends meetings of clubs or organizations: Patient refused    Relationship status: Patient refused  Other Topics Concern  . Not on file  Social History Narrative  . Not on file    Outpatient Encounter Medications as of 04/13/2019  Medication Sig  . aspirin EC 81 MG tablet Take 81 mg by mouth daily.  Marland Kitchen atorvastatin (LIPITOR) 40 MG tablet Take 1 tablet (40 mg total) by mouth daily.  . carbamazepine (TEGRETOL) 200 MG tablet Take 200 mg by mouth at bedtime.   . gabapentin (NEURONTIN) 600 MG tablet Take 1.5 tablets (900 mg total) by mouth every 6 (six) hours.  .  hydrochlorothiazide (HYDRODIURIL) 12.5 MG tablet Take 1 tablet (12.5 mg total) by mouth daily.  Marland Kitchen losartan (COZAAR) 100 MG tablet Take 1 tablet (100 mg total) by mouth daily.  . nortriptyline (PAMELOR) 75 MG capsule Take 1 capsule (75 mg total) by mouth at bedtime.  Marland Kitchen omeprazole (PRILOSEC) 20 MG capsule Take 1 capsule (20 mg total) by mouth daily.  . Vitamin D, Ergocalciferol, (DRISDOL) 50000 units CAPS capsule Take 50,000 Units by mouth every 7 (seven) days.    No facility-administered encounter medications on file as of 04/13/2019.     Activities of Daily Living In your  present state of health, do you have any difficulty performing the following activities: 04/13/2019  Hearing? N  Vision? N  Difficulty concentrating or making decisions? N  Walking or climbing stairs? N  Dressing or bathing? N  Doing errands, shopping? N  Preparing Food and eating ? N  Using the Toilet? N  In the past six months, have you accidently leaked urine? N  Do you have problems with loss of bowel control? N  Managing your Medications? N  Managing your Finances? N  Housekeeping or managing your Housekeeping? N  Some recent data might be hidden    Patient Care Team: Bacigalupo, Dionne Bucy, MD as PCP - General (Family Medicine) Milinda Pointer, MD as Referring Physician (Pain Medicine) Marry Guan Laurice Record, MD as Consulting Physician (Orthopedic Surgery)    Assessment:   This is a routine wellness examination for [place].  Exercise Activities and Dietary recommendations Current Exercise Habits: Home exercise routine, Type of exercise: walking, Time (Minutes): 20, Frequency (Times/Week): 3, Weekly Exercise (Minutes/Week): 60, Intensity: Mild, Exercise limited by: None identified  Goals    . Decrease soda or juice intake     Recommend decreasing soda intake to 1 a day and increase water intake 6 glasses of water a day.     . Have 3 meals a day     Recommend to eat 3 small meals a day with 2 healthy snacks in  between.    . Prevent falls     Recommend to remove any items from the home that may cause slips or trips.       Fall Risk: Fall Risk  04/13/2019 01/10/2019 04/09/2018 10/28/2017 05/05/2017  Falls in the past year? 1 0 No No No  Number falls in past yr: 0 0 - - -  Injury with Fall? 0 0 - - -  Risk for fall due to : - - - - -  Risk for fall due to: Comment - - - - -  Follow up Falls prevention discussed - - - -    FALL RISK PREVENTION PERTAINING TO THE HOME:  Any stairs in or around the home? Yes  If so, are there any without handrails? No   Home free of loose throw rugs in walkways, pet beds, electrical cords, etc? Yes  Adequate lighting in your home to reduce risk of falls? Yes   ASSISTIVE DEVICES UTILIZED TO PREVENT FALLS:  Life alert? No  Use of a cane, walker or w/c? No  Grab bars in the bathroom? No  Shower chair or bench in shower? No  Elevated toilet seat or a handicapped toilet? No   TIMED UP AND GO:  Was the test performed? No .    Depression Screen PHQ 2/9 Scores 04/13/2019 04/09/2018 10/28/2017 05/05/2017  PHQ - 2 Score 0 3 0 0  PHQ- 9 Score - 6 - -     Cognitive Function: Declined today.         Immunization History  Administered Date(s) Administered  . Fluad Quad(high Dose 65+) 04/05/2019  . Influenza Split 04/09/2010, 04/02/2012, 05/26/2012  . Influenza, High Dose Seasonal PF 04/04/2014, 02/29/2016, 02/24/2017, 04/09/2018  . Pneumococcal Conjugate-13 02/24/2017  . Pneumococcal Polysaccharide-23 04/09/2018  . Tdap 07/25/2008    Qualifies for Shingles Vaccine? Yes . Due for Shingrix. Education has been provided regarding the importance of this vaccine. Pt has been advised to call insurance company to determine out of pocket expense. Advised may also receive vaccine at local pharmacy or Health Dept.  Verbalized acceptance and understanding.  Tdap: Although this vaccine is not a covered service during a Wellness Exam, does the patient still wish to  receive this vaccine today?  No .   Flu Vaccine: Up to date  Pneumococcal Vaccine: Completed series  Screening Tests Health Maintenance  Topic Date Due  . COLONOSCOPY  12/15/2018  . OPHTHALMOLOGY EXAM  02/20/2019  . HEMOGLOBIN A1C  04/13/2019  . TETANUS/TDAP  04/12/2020 (Originally 07/25/2018)  . FOOT EXAM  10/12/2019  . MAMMOGRAM  12/26/2020  . INFLUENZA VACCINE  Completed  . DEXA SCAN  Completed  . Hepatitis C Screening  Completed  . PNA vac Low Risk Adult  Completed    Cancer Screenings:  Colorectal Screening: Completed 12/14/08. Repeat every 10 years. Referral to GI placed today. Pt aware the office will call re: appt.  Mammogram: Completed 12/27/18.   Bone Density: Completed 07/27/08. Results reflect NORMAL. No repeat needed unless advised by a physician.   Lung Cancer Screening: (Low Dose CT Chest recommended if Age 46-80 years, 30 pack-year currently smoking OR have quit w/in 15years.) does not qualify.   Additional Screening:  Hepatitis C Screening: Up to date  Dental Screening: Recommended annual dental exams for proper oral hygiene   Community Resource Referral:  CRR required this visit?  No       Plan:  I have personally reviewed and addressed the Medicare Annual Wellness questionnaire and have noted the following in the patient's chart:  A. Medical and social history B. Use of alcohol, tobacco or illicit drugs  C. Current medications and supplements D. Functional ability and status E.  Nutritional status F.  Physical activity G. Advance directives H. List of other physicians I.  Hospitalizations, surgeries, and ER visits in previous 12 months J.  [place] such as hearing and vision if needed, cognitive and depression L. Referrals and appointments   In addition, I have reviewed and discussed with patient certain preventive protocols, quality metrics, and best practice recommendations. A written personalized care plan for preventive services  as well as general preventive health recommendations were provided to patient.   Glendora Score, [place]  [id number] Nurse Health Advisor   Nurse Notes: Pt needs her A1c check at tomorrow office visit. Pt plans to schedule an eye exam this year.

## 2019-04-13 ENCOUNTER — Other Ambulatory Visit: Payer: Self-pay

## 2019-04-13 ENCOUNTER — Ambulatory Visit (INDEPENDENT_AMBULATORY_CARE_PROVIDER_SITE_OTHER): Payer: Medicare Other

## 2019-04-13 ENCOUNTER — Telehealth: Payer: Self-pay

## 2019-04-13 DIAGNOSIS — Z1211 Encounter for screening for malignant neoplasm of colon: Secondary | ICD-10-CM

## 2019-04-13 DIAGNOSIS — Z0000 Encounter for general adult medical examination without abnormal findings: Secondary | ICD-10-CM | POA: Diagnosis not present

## 2019-04-13 NOTE — Patient Instructions (Signed)
[name] , Thank you for taking time to come for your Medicare Wellness Visit. I appreciate your ongoing commitment to your health goals. Please review the following plan we discussed and let me know if I can assist you in the future.   Screening recommendations/referrals: Colonoscopy: Referral to GI placed today. Pt aware the office will call re: appt. Mammogram: Up to date, due 12/2020 Bone Density: Up to date, previous DEXA was normal. No repeat needed unless advised by a physician.  Recommended yearly ophthalmology/optometry visit for glaucoma screening and checkup Recommended yearly dental visit for hygiene and checkup  Vaccinations: Influenza vaccine: Up to date Pneumococcal vaccine: Completed series Tdap vaccine: Pt declines today.  Shingles vaccine: Pt declines today.     Advanced directives: Please bring a copy of your POA (Power of Attorney) and/or Living Will to your next appointment.   Conditions/risks identified: Fall risk prevention discussed today.   Next appointment: 04/14/19 @ 10:40 AM with Dr Brita Romp.   Preventive Care 49 Years and Older, Female Preventive care refers to lifestyle choices and visits with your health care provider that can promote health and wellness. What does preventive care include?  A yearly physical exam. This is also called an annual well check.  Dental exams once or twice a year.  Routine eye exams. Ask your health care provider how often you should have your eyes checked.  Personal lifestyle choices, including:  Daily care of your teeth and gums.  Regular physical activity.  Eating a healthy diet.  Avoiding tobacco and drug use.  Limiting alcohol use.  Practicing safe sex.  Taking low-dose aspirin every day.  Taking vitamin and mineral supplements as recommended by your health care provider. What happens during an annual well check? The services and screenings done by your health care provider during your annual well  check will depend on your age, overall health, lifestyle risk factors, and family history of disease. Counseling  Your health care provider may ask you questions about your:  Alcohol use.  Tobacco use.  Drug use.  Emotional well-being.  Home and relationship well-being.  Sexual activity.  Eating habits.  History of falls.  Memory and ability to understand (cognition).  Work and work Statistician.  Reproductive health. Screening  You may have the following tests or measurements:  Height, weight, and BMI.  Blood pressure.  Lipid and cholesterol levels. These may be checked every 5 years, or more frequently if you are over 13 years old.  Skin check.  Lung cancer screening. You may have this screening every year starting at age 74 if you have a 30-pack-year history of smoking and currently smoke or have quit within the past 15 years.  Fecal occult blood test (FOBT) of the stool. You may have this test every year starting at age 57.  Flexible sigmoidoscopy or colonoscopy. You may have a sigmoidoscopy every 5 years or a colonoscopy every 10 years starting at age 63.  Hepatitis C blood test.  Hepatitis B blood test.  Sexually transmitted disease (STD) testing.  Diabetes screening. This is done by checking your blood sugar (glucose) after you have not eaten for a while (fasting). You may have this done every 1-3 years.  Bone density scan. This is done to screen for osteoporosis. You may have this done starting at age 18.  Mammogram. This may be done every 1-2 years. Talk to your health care provider about how often you should have regular mammograms. Talk with your health care provider about  your test results, treatment options, and if necessary, the need for more tests. Vaccines  Your health care provider may recommend certain vaccines, such as:  Influenza vaccine. This is recommended every year.  Tetanus, diphtheria, and acellular pertussis (Tdap, Td) vaccine. You  may need a Td booster every 10 years.  Zoster vaccine. You may need this after age 59.  Pneumococcal 13-valent conjugate (PCV13) vaccine. One dose is recommended after age 66.  Pneumococcal polysaccharide (PPSV23) vaccine. One dose is recommended after age 32. Talk to your health care provider about which screenings and vaccines you need and how often you need them. This information is not intended to replace advice given to you by your health care provider. Make sure you discuss any questions you have with your health care provider. Document Released: 06/29/2015 Document Revised: 02/20/2016 Document Reviewed: 04/03/2015 Elsevier Interactive Patient Education  2017 [place] Prevention in the Home Falls can cause injuries. They can happen to people of all ages. There are many things you can do to make your home safe and to help prevent falls. What can I do on the outside of my home?  Regularly fix the edges of walkways and driveways and fix any cracks.  Remove anything that might make you trip as you walk through a door, such as a raised step or threshold.  Trim any bushes or trees on the path to your home.  Use bright outdoor lighting.  Clear any walking paths of anything that might make someone trip, such as rocks or tools.  Regularly check to see if handrails are loose or broken. Make sure that both sides of any steps have handrails.  Any raised decks and porches should have guardrails on the edges.  Have any leaves, snow, or ice cleared regularly.  Use sand or salt on walking paths during winter.  Clean up any spills in your garage right away. This includes oil or grease spills. What can I do in the bathroom?  Use night lights.  Install grab bars by the toilet and in the tub and shower. Do not use towel bars as grab bars.  Use non-skid mats or decals in the tub or shower.  If you need to sit down in the shower, use a plastic, non-slip stool.  Keep the floor  dry. Clean up any water that spills on the floor as soon as it happens.  Remove soap buildup in the tub or shower regularly.  Attach bath mats securely with double-sided non-slip rug tape.  Do not have throw rugs and other things on the floor that can make you trip. What can I do in the bedroom?  Use night lights.  Make sure that you have a light by your bed that is easy to reach.  Do not use any sheets or blankets that are too big for your bed. They should not hang down onto the floor.  Have a firm chair that has side arms. You can use this for support while you get dressed.  Do not have throw rugs and other things on the floor that can make you trip. What can I do in the kitchen?  Clean up any spills right away.  Avoid walking on wet floors.  Keep items that you use a lot in easy-to-reach places.  If you need to reach something above you, use a strong step stool that has a grab bar.  Keep electrical cords out of the way.  Do not use floor polish or wax  that makes floors slippery. If you must use wax, use non-skid floor wax.  Do not have throw rugs and other things on the floor that can make you trip. What can I do with my stairs?  Do not leave any items on the stairs.  Make sure that there are handrails on both sides of the stairs and use them. Fix handrails that are broken or loose. Make sure that handrails are as long as the stairways.  Check any carpeting to make sure that it is firmly attached to the stairs. Fix any carpet that is loose or worn.  Avoid having throw rugs at the top or bottom of the stairs. If you do have throw rugs, attach them to the floor with carpet tape.  Make sure that you have a light switch at the top of the stairs and the bottom of the stairs. If you do not have them, ask someone to add them for you. What else can I do to help prevent falls?  Wear shoes that:  Do not have high heels.  Have rubber bottoms.  Are comfortable and fit you  well.  Are closed at the toe. Do not wear sandals.  If you use a stepladder:  Make sure that it is fully opened. Do not climb a closed stepladder.  Make sure that both sides of the stepladder are locked into place.  Ask someone to hold it for you, if possible.  Clearly mark and make sure that you can see:  Any grab bars or handrails.  First and last steps.  Where the edge of each step is.  Use tools that help you move around (mobility aids) if they are needed. These include:  Canes.  Walkers.  Scooters.  Crutches.  Turn on the lights when you go into a dark area. Replace any light bulbs as soon as they burn out.  Set up your furniture so you have a clear path. Avoid moving your furniture around.  If any of your floors are uneven, fix them.  If there are any pets around you, be aware of where they are.  Review your medicines with your doctor. Some medicines can make you feel dizzy. This can increase your chance of falling. Ask your doctor what other things that you can do to help prevent falls. This information is not intended to replace advice given to you by your health care provider. Make sure you discuss any questions you have with your health care provider. Document Released: 03/29/2009 Document Revised: 11/08/2015 Document Reviewed: 07/07/2014 Elsevier Interactive Patient Education  2017 [employer].

## 2019-04-13 NOTE — Telephone Encounter (Signed)
Gastroenterology Pre-Procedure Review  Request Date: Monday 05/02/19 Requesting Physician: Dr. Allen Norris  PATIENT REVIEW QUESTIONS: The patient responded to the following health history questions as indicated:    1. Are you having any GI issues? yes (occasional constipation) 2. Do you have a personal history of Polyps? no 3. Do you have a family history of Colon Cancer or Polyps? no 4. Diabetes Mellitus? yes (controlled) 5. Joint replacements in the past 12 months?no 6. Major health problems in the past 3 months?no 7. Any artificial heart valves, MVP, or defibrillator?no    MEDICATIONS & ALLERGIES:    Patient reports the following regarding taking any anticoagulation/antiplatelet therapy:   Plavix, Coumadin, Eliquis, Xarelto, Lovenox, Pradaxa, Brilinta, or Effient? no Aspirin? yes (81 mg daily)  Patient confirms/reports the following medications:  Current Outpatient Medications  Medication Sig Dispense Refill  . aspirin EC 81 MG tablet Take 81 mg by mouth daily.    Marland Kitchen atorvastatin (LIPITOR) 40 MG tablet Take 1 tablet (40 mg total) by mouth daily. 90 tablet 3  . carbamazepine (TEGRETOL) 200 MG tablet Take 200 mg by mouth at bedtime.     . gabapentin (NEURONTIN) 600 MG tablet Take 1.5 tablets (900 mg total) by mouth every 6 (six) hours. 540 tablet 3  . hydrochlorothiazide (HYDRODIURIL) 12.5 MG tablet Take 1 tablet (12.5 mg total) by mouth daily. 90 tablet 3  . losartan (COZAAR) 100 MG tablet Take 1 tablet (100 mg total) by mouth daily. 90 tablet 3  . nortriptyline (PAMELOR) 75 MG capsule Take 1 capsule (75 mg total) by mouth at bedtime. 90 capsule 3  . omeprazole (PRILOSEC) 20 MG capsule Take 1 capsule (20 mg total) by mouth daily. 90 capsule 3  . Vitamin D, Ergocalciferol, (DRISDOL) 50000 units CAPS capsule Take 50,000 Units by mouth every 7 (seven) days.      No current facility-administered medications for this visit.     Patient confirms/reports the following allergies:  Allergies   Allergen Reactions  . Lisinopril Cough  . Lovastatin Other (See Comments)    Myalgia  . Shellfish Allergy Rash    No orders of the defined types were placed in this encounter.   AUTHORIZATION INFORMATION Primary Insurance: 1D#: Group #:  Secondary Insurance: 1D#: Group #:  SCHEDULE INFORMATION: Date: 05/02/19 Time: Location:[facility]

## 2019-04-14 ENCOUNTER — Other Ambulatory Visit: Payer: Self-pay

## 2019-04-14 ENCOUNTER — Ambulatory Visit: Payer: Medicare Other

## 2019-04-14 ENCOUNTER — Encounter: Payer: Self-pay | Admitting: Family Medicine

## 2019-04-14 ENCOUNTER — Ambulatory Visit (INDEPENDENT_AMBULATORY_CARE_PROVIDER_SITE_OTHER): Payer: Medicare Other | Admitting: Family Medicine

## 2019-04-14 VITALS — BP 112/76 | HR 106 | Temp 97.1°F | Ht 63.0 in | Wt 227.0 lb

## 2019-04-14 DIAGNOSIS — E785 Hyperlipidemia, unspecified: Secondary | ICD-10-CM

## 2019-04-14 DIAGNOSIS — E559 Vitamin D deficiency, unspecified: Secondary | ICD-10-CM | POA: Diagnosis not present

## 2019-04-14 DIAGNOSIS — E1129 Type 2 diabetes mellitus with other diabetic kidney complication: Secondary | ICD-10-CM | POA: Diagnosis not present

## 2019-04-14 DIAGNOSIS — R5382 Chronic fatigue, unspecified: Secondary | ICD-10-CM | POA: Diagnosis not present

## 2019-04-14 DIAGNOSIS — I10 Essential (primary) hypertension: Secondary | ICD-10-CM | POA: Diagnosis not present

## 2019-04-14 DIAGNOSIS — R809 Proteinuria, unspecified: Secondary | ICD-10-CM | POA: Diagnosis not present

## 2019-04-14 DIAGNOSIS — E1169 Type 2 diabetes mellitus with other specified complication: Secondary | ICD-10-CM | POA: Diagnosis not present

## 2019-04-14 DIAGNOSIS — Z1211 Encounter for screening for malignant neoplasm of colon: Secondary | ICD-10-CM

## 2019-04-14 MED ORDER — HYDROCORTISONE (PERIANAL) 2.5 % EX CREA: 1.0000 "application " | TOPICAL_CREAM | Freq: Two times a day (BID) | CUTANEOUS | 5 refills | Status: DC

## 2019-04-14 NOTE — Assessment & Plan Note (Signed)
Discussed the importance of good diabetes control Continue losartan

## 2019-04-14 NOTE — Assessment & Plan Note (Signed)
Discussed importance of healthy weight management Discussed diet and exercise  

## 2019-04-14 NOTE — Assessment & Plan Note (Signed)
Advised on continuing OTC supplementation with

## 2019-04-14 NOTE — Progress Notes (Signed)
Patient: [name] Female    DOB: 1951-02-03   68 y.o.   MRN: [id number] Visit Date: 04/14/2019  Today's Provider: Lavon Paganini, MD   Chief Complaint  Patient presents with  . Diabetes  . Hyperlipidemia  . Hypertension   Subjective:     HPI     Diabetes Mellitus Type II, Follow-up:   Lab Results  Component Value Date   HGBA1C 6.7 (A) 10/12/2018   HGBA1C 6.8 (A) 04/09/2018   HGBA1C 6.5 (A) 11/04/2017   Last seen for diabetes 6 months ago.  Management since then includes no changes. She reports excellent compliance with treatment. She is not having side effects.  Current symptoms include none and have been unchanged. Home blood sugar records: not checked  Episodes of hypoglycemia? no   Current Insulin Regimen: none Most Recent Eye Exam: 02/2018 Weight trend: stable Prior visit with dietician: no Current diet: diabetic Current exercise: no regular exercise and walking  ------------------------------------------------------------------------   Hypertension, follow-up:  BP Readings from Last 3 Encounters:  04/14/19 112/76  01/10/19 140/82  10/12/18 122/74    She was last seen for hypertension 6 months ago.  BP at that visit was well controlled. Management since that visit includes no changes to medications.She reports excellent compliance with treatment. She is not having side effects.  She is exercising. She is adherent to low salt diet.   Outside blood pressures are not checked. She is experiencing none.  Patient denies chest pain, chest pressure/discomfort, claudication, dyspnea, irregular heart beat, lower extremity edema and palpitations.   Cardiovascular risk factors include advanced age (older than 78 for men, 63 for women), diabetes mellitus, dyslipidemia, hypertension, microalbuminuria and obesity (BMI >= 30 kg/m2).  Use of agents associated with hypertension: none.    ------------------------------------------------------------------------    Lipid/Cholesterol, Follow-up:   Last seen for this 1 years ago.  Management since that visit includes no changes.  Last Lipid Panel:    Component Value Date/Time   CHOL 140 04/09/2018 1052   CHOL 210 (H) 11/03/2013 0921   TRIG 87 04/09/2018 1052   TRIG 97 11/03/2013 0921   HDL 47 04/09/2018 1052   HDL 50 11/03/2013 0921   CHOLHDL 3.0 04/09/2018 1052   CHOLHDL 4.0 03/26/2017 0905   VLDL 19 11/03/2013 0921   LDLCALC 76 04/09/2018 1052   LDLCALC 111 (H) 03/26/2017 0905   LDLCALC 141 (H) 11/03/2013 0921    She reports excellent compliance with treatment. She is not having side effects.  Wt Readings from Last 3 Encounters:  04/14/19 227 lb (103 kg)  01/10/19 231 lb (104.8 kg)  10/12/18 229 lb (103.9 kg)    ------------------------------------------------------------------------  Fatigue for last 2-3 months.  No CP, SOB, headaches, N/V.   Allergies  Allergen Reactions  . Lisinopril Cough  . Lovastatin Other (See Comments)    Myalgia  . Shellfish Allergy Rash     Current Outpatient Medications:  .  aspirin EC 81 MG tablet, Take 81 mg by mouth daily., Disp: , Rfl:  .  atorvastatin (LIPITOR) 40 MG tablet, Take 1 tablet (40 mg total) by mouth daily., Disp: 90 tablet, Rfl: 3 .  carbamazepine (TEGRETOL) 200 MG tablet, Take 200 mg by mouth at bedtime. , Disp: , Rfl:  .  hydrochlorothiazide (HYDRODIURIL) 12.5 MG tablet, Take 1 tablet (12.5 mg total) by mouth daily., Disp: 90 tablet, Rfl: 3 .  losartan (COZAAR) 100 MG tablet, Take 1 tablet (100 mg total) by mouth  daily., Disp: 90 tablet, Rfl: 3 .  nortriptyline (PAMELOR) 75 MG capsule, Take 1 capsule (75 mg total) by mouth at bedtime., Disp: 90 capsule, Rfl: 3 .  omeprazole (PRILOSEC) 20 MG capsule, Take 1 capsule (20 mg total) by mouth daily., Disp: 90 capsule, Rfl: 3 .  Vitamin D, Ergocalciferol, (DRISDOL) 50000 units CAPS capsule, Take 50,000  Units by mouth every 7 (seven) days. , Disp: , Rfl:  .  gabapentin (NEURONTIN) 600 MG tablet, Take 1.5 tablets (900 mg total) by mouth every 6 (six) hours., Disp: 540 tablet, Rfl: 3 .  hydrocortisone (ANUSOL-HC) 2.5 % rectal cream, Place 1 application rectally 2 (two) times daily., Disp: 60 g, Rfl: 5  Review of Systems  Constitutional: Negative.   HENT: Negative.   Eyes: Negative.   Respiratory: Negative.   Cardiovascular: Negative.   Gastrointestinal: Negative.   Endocrine: Negative.   Genitourinary: Negative.   Musculoskeletal: Negative.   Skin: Negative.   Allergic/Immunologic: Negative.   Neurological: Positive for headaches. Negative for dizziness, tremors, seizures, syncope, facial asymmetry, speech difficulty, weakness, light-headedness and numbness.  Hematological: Negative.   Psychiatric/Behavioral: Negative.     Social History   Tobacco Use  . Smoking status: Never Smoker  . Smokeless tobacco: Never Used  Substance Use Topics  . Alcohol use: No    Alcohol/week: 0.0 standard drinks      Objective:   BP 112/76 (BP Location: Left Arm, Patient Position: Sitting, Cuff Size: Large)   Pulse (!) 106   Temp (!) 97.1 F (36.2 C) (Temporal)   Ht 5\' 3"  (1.6 m)   Wt 227 lb (103 kg)   SpO2 98%   BMI 40.21 kg/m  Vitals:   04/14/19 1027  BP: 112/76  Pulse: (!) 106  Temp: (!) 97.1 F (36.2 C)  TempSrc: Temporal  SpO2: 98%  Weight: 227 lb (103 kg)  Height: 5\' 3"  (1.6 m)  Body mass index is 40.21 kg/m.   Physical Exam Vitals signs reviewed.  Constitutional:      General: She is not in acute distress.    Appearance: Normal appearance. She is well-developed. She is not diaphoretic.  HENT:     Head: Normocephalic and atraumatic.     Right Ear: Tympanic membrane, ear canal and external ear normal.     Left Ear: Tympanic membrane, ear canal and external ear normal.  Eyes:     General: No scleral icterus.    Conjunctiva/sclera: Conjunctivae normal.     Pupils:  Pupils are equal, round, and reactive to light.  Neck:     Musculoskeletal: Neck supple.     Thyroid: No thyromegaly.  Cardiovascular:     Rate and Rhythm: Normal rate and regular rhythm.     Pulses: Normal pulses.     Heart sounds: Normal heart sounds. No murmur.  Pulmonary:     Effort: Pulmonary effort is normal. No respiratory distress.     Breath sounds: Normal breath sounds. No wheezing or rales.  Abdominal:     General: There is no distension.     Palpations: Abdomen is soft.     Tenderness: There is no abdominal tenderness.  Musculoskeletal:        General: No deformity.     Right lower leg: No edema.     Left lower leg: No edema.  Lymphadenopathy:     Cervical: No cervical adenopathy.  Skin:    General: Skin is warm and dry.     Capillary Refill: Capillary refill takes less  than 2 seconds.     Findings: No rash.  Neurological:     Mental Status: She is alert and oriented to person, place, and time.  Psychiatric:        Mood and Affect: Mood normal.        Behavior: Behavior normal.        Thought Content: Thought content normal.      No results found for any visits on 04/14/19.     Assessment & Plan   Problem List Items Addressed This Visit      Cardiovascular and Mediastinum   Hypertension - Primary    Well controlled Continue current medications Recheck metabolic panel F/u in 6 months      Relevant Orders   Comprehensive metabolic panel     Endocrine   Diabetes ([place])    Previously well controlled with diet Associated with hyperlipidemia and hypertension as well as microalbuminuria On no medications Advised on diet and exercise Advised patient to schedule next eye exam Up-to-date on vaccinations and other screenings Continue losartan for microalbuminuria Continue statin Recheck A1c Follow-up in 6 months      Relevant Orders   Hemoglobin A1c   Microalbuminuria due to type 2 diabetes mellitus ([place])    Discussed the importance of good  diabetes control Continue losartan        Other   Morbid obesity ([place])    Discussed importance of healthy weight management Discussed diet and exercise      Relevant Orders   Comprehensive metabolic panel   CBC   Hemoglobin A1c   Lipid panel   TSH   Avitaminosis D    Advised on continuing OTC supplementation with       Other Visit Diagnoses    Hyperlipidemia associated with type 2 diabetes mellitus ([place])       Relevant Orders   Comprehensive metabolic panel   Lipid panel   Chronic fatigue       Relevant Orders   Comprehensive metabolic panel   CBC   TSH       Return in about 6 months (around 10/13/2019) for chronic disease f/u. 1 month Nurse visit for 1st Shingrix vaccine   The entirety of the information documented in the History of Present Illness, Review of Systems and Physical Exam were personally obtained by me. Portions of this information were initially documented by Ashley Royalty, CMA and reviewed by me for thoroughness and accuracy.    Railee Bonillas, Dionne Bucy, MD MPH [facility]

## 2019-04-14 NOTE — Assessment & Plan Note (Signed)
Previously well controlled with diet Associated with hyperlipidemia and hypertension as well as microalbuminuria On no medications Advised on diet and exercise Advised patient to schedule next eye exam Up-to-date on vaccinations and other screenings Continue losartan for microalbuminuria Continue statin Recheck A1c Follow-up in 6 months

## 2019-04-14 NOTE — Assessment & Plan Note (Signed)
Well controlled Continue current medications Recheck metabolic panel F/u in 6 months  

## 2019-04-14 NOTE — Patient Instructions (Signed)
Schedule next eye exam  Start taking Vit D3 2000-5000 units daily   Preventive Care 70 Years and Older, Female Preventive care refers to lifestyle choices and visits with your health care provider that can promote health and wellness. This includes:  A yearly physical exam. This is also called an annual well check.  Regular dental and eye exams.  Immunizations.  Screening for certain conditions.  Healthy lifestyle choices, such as diet and exercise. What can I expect for my preventive care visit? Physical exam Your health care provider will check:  Height and weight. These may be used to calculate body mass index (BMI), which is a measurement that tells if you are at a healthy weight.  Heart rate and blood pressure.  Your skin for abnormal spots. Counseling Your health care provider may ask you questions about:  Alcohol, tobacco, and drug use.  Emotional well-being.  Home and relationship well-being.  Sexual activity.  Eating habits.  History of falls.  Memory and ability to understand (cognition).  Work and work Statistician.  Pregnancy and menstrual history. What immunizations do I need?  Influenza (flu) vaccine  This is recommended every year. Tetanus, diphtheria, and pertussis (Tdap) vaccine  You may need a Td booster every 10 years. Varicella (chickenpox) vaccine  You may need this vaccine if you have not already been vaccinated. Zoster (shingles) vaccine  You may need this after age 70. Pneumococcal conjugate (PCV13) vaccine  One dose is recommended after age 70. Pneumococcal polysaccharide (PPSV23) vaccine  One dose is recommended after age 70. Measles, mumps, and rubella (MMR) vaccine  You may need at least one dose of MMR if you were born in 1957 or later. You may also need a second dose. Meningococcal conjugate (MenACWY) vaccine  You may need this if you have certain conditions. Hepatitis A vaccine  You may need this if you have  certain conditions or if you travel or work in places where you may be exposed to hepatitis A. Hepatitis B vaccine  You may need this if you have certain conditions or if you travel or work in places where you may be exposed to hepatitis B. Haemophilus influenzae type b (Hib) vaccine  You may need this if you have certain conditions. You may receive vaccines as individual doses or as more than one vaccine together in one shot (combination vaccines). Talk with your health care provider about the risks and benefits of combination vaccines. What tests do I need? Blood tests  Lipid and cholesterol levels. These may be checked every 5 years, or more frequently depending on your overall health.  Hepatitis C test.  Hepatitis B test. Screening  Lung cancer screening. You may have this screening every year starting at age 68 if you have a 30-pack-year history of smoking and currently smoke or have quit within the past 15 years.  Colorectal cancer screening. All adults should have this screening starting at age 40 and continuing until age 70. Your health care provider may recommend screening at age 70 if you are at increased risk. You will have tests every 1-10 years, depending on your results and the type of screening test.  Diabetes screening. This is done by checking your blood sugar (glucose) after you have not eaten for a while (fasting). You may have this done every 1-3 years.  Mammogram. This may be done every 1-2 years. Talk with your health care provider about how often you should have regular mammograms.  BRCA-related cancer screening. This may be  done if you have a family history of breast, ovarian, tubal, or peritoneal cancers. Other tests  Sexually transmitted disease (STD) testing.  Bone density scan. This is done to screen for osteoporosis. You may have this done starting at age 70. Follow these instructions at home: Eating and drinking  Eat a diet that includes fresh fruits  and vegetables, whole grains, lean protein, and low-fat dairy products. Limit your intake of foods with high amounts of sugar, saturated fats, and salt.  Take vitamin and mineral supplements as recommended by your health care provider.  Do not drink alcohol if your health care provider tells you not to drink.  If you drink alcohol: ? Limit how much you have to 0-1 drink a day. ? Be aware of how much alcohol is in your drink. In the [place] one drink equals one 12 oz bottle of beer (355 mL), one 5 oz glass of wine (148 mL), or one 1 oz glass of hard liquor (44 mL). Lifestyle  Take daily care of your teeth and gums.  Stay active. Exercise for at least 30 minutes on 5 or more days each week.  Do not use any products that contain nicotine or tobacco, such as cigarettes, e-cigarettes, and chewing tobacco. If you need help quitting, ask your health care provider.  If you are sexually active, practice safe sex. Use a condom or other form of protection in order to prevent STIs (sexually transmitted infections).  Talk with your health care provider about taking a low-dose aspirin or statin. What's next?  Go to your health care provider once a year for a well check visit.  Ask your health care provider how often you should have your eyes and teeth checked.  Stay up to date on all vaccines. This information is not intended to replace advice given to you by your health care provider. Make sure you discuss any questions you have with your health care provider. Document Released: 06/29/2015 Document Revised: 05/27/2018 Document Reviewed: 05/27/2018 Elsevier Patient Education  2020 [employer].

## 2019-04-15 LAB — COMPREHENSIVE METABOLIC PANEL
ALT: 13 IU/L (ref 0–32)
AST: 15 IU/L (ref 0–40)
Albumin/Globulin Ratio: 1.3 (ref 1.2–2.2)
Albumin: 3.9 g/dL (ref 3.8–4.8)
Alkaline Phosphatase: 98 IU/L (ref 39–117)
BUN/Creatinine Ratio: 12 (ref 12–28)
BUN: 13 mg/dL (ref 8–27)
Bilirubin Total: 0.6 mg/dL (ref 0.0–1.2)
CO2: 22 mmol/L (ref 20–29)
Calcium: 9.5 mg/dL (ref 8.7–10.3)
Chloride: 101 mmol/L (ref 96–106)
Creatinine, Ser: 1.06 mg/dL — ABNORMAL HIGH (ref 0.57–1.00)
GFR calc Af Amer: 61 mL/min/{1.73_m2} (ref >59)
GFR calc non Af Amer: 53 mL/min/{1.73_m2} — ABNORMAL LOW (ref >59)
Globulin, Total: 3.1 g/dL (ref 1.5–4.5)
Glucose: 106 mg/dL — ABNORMAL HIGH (ref 65–99)
Potassium: 4.1 mmol/L (ref 3.5–5.2)
Sodium: 137 mmol/L (ref 134–144)
Total Protein: 7 g/dL (ref 6.0–8.5)

## 2019-04-15 LAB — CBC
Hematocrit: 43.3 % (ref 34.0–46.6)
Hemoglobin: 13.5 g/dL (ref 11.1–15.9)
MCH: 25.8 pg — ABNORMAL LOW (ref 26.6–33.0)
MCHC: 31.2 g/dL — ABNORMAL LOW (ref 31.5–35.7)
MCV: 83 fL (ref 79–97)
Platelets: 292 10*3/uL (ref 150–450)
RBC: 5.23 x10E6/uL (ref 3.77–5.28)
RDW: 12.9 % (ref 11.7–15.4)
WBC: 9.5 10*3/uL (ref 3.4–10.8)

## 2019-04-15 LAB — HEMOGLOBIN A1C
Est. average glucose Bld gHb Est-mCnc: 143 mg/dL
Hgb A1c MFr Bld: 6.6 % — ABNORMAL HIGH (ref 4.8–5.6)

## 2019-04-15 LAB — LIPID PANEL
Chol/HDL Ratio: 3.4 ratio (ref 0.0–4.4)
Cholesterol, Total: 139 mg/dL (ref 100–199)
HDL: 41 mg/dL (ref >39)
LDL Chol Calc (NIH): 75 mg/dL (ref 0–99)
Triglycerides: 130 mg/dL (ref 0–149)
VLDL Cholesterol Cal: 23 mg/dL (ref 5–40)

## 2019-04-15 LAB — TSH: TSH: 2.25 u[IU]/mL (ref 0.450–4.500)

## 2019-04-21 ENCOUNTER — Encounter: Payer: Self-pay | Admitting: *Deleted

## 2019-04-21 ENCOUNTER — Other Ambulatory Visit: Payer: Self-pay

## 2019-04-25 ENCOUNTER — Telehealth: Payer: Self-pay | Admitting: Family Medicine

## 2019-04-25 NOTE — Telephone Encounter (Signed)
Pt returned missed call. Please call pt back at [phone].  Thanks, [employer]

## 2019-04-28 ENCOUNTER — Other Ambulatory Visit
Admission: RE | Admit: 2019-04-28 | Discharge: 2019-04-28 | Disposition: A | Discharge status: Home / Self Care | Payer: Medicare Other | Source: Ambulatory Visit | Attending: Gastroenterology | Admitting: Gastroenterology

## 2019-04-28 ENCOUNTER — Other Ambulatory Visit: Payer: Self-pay

## 2019-04-28 DIAGNOSIS — Z01812 Encounter for preprocedural laboratory examination: Secondary | ICD-10-CM | POA: Diagnosis not present

## 2019-04-28 DIAGNOSIS — Z20828 Contact with and (suspected) exposure to other viral communicable diseases: Secondary | ICD-10-CM | POA: Insufficient documentation

## 2019-04-28 LAB — SARS CORONAVIRUS 2 (TAT 6-24 HRS): SARS Coronavirus 2: NEGATIVE

## 2019-04-28 NOTE — Anesthesia Preprocedure Evaluation (Addendum)
Anesthesia Evaluation  Patient identified by MRN, date of birth, ID band Patient awake    Reviewed: Allergy & Precautions, NPO status , Patient's Chart, lab work & pertinent test results  History of Anesthesia Complications Negative for: history of anesthetic complications  Airway Mallampati: III   Neck ROM: Full    Dental  (+)    Pulmonary asthma ,    Pulmonary exam normal breath sounds clear to auscultation       Cardiovascular hypertension, Normal cardiovascular exam Rhythm:Regular Rate:Normal     Neuro/Psych PSYCHIATRIC DISORDERS Anxiety Depression negative neurological ROS     GI/Hepatic GERD  ,  Endo/Other  diabetes, Type 2Morbid obesity  Renal/GU negative Renal ROS     Musculoskeletal  (+) Arthritis ,   Abdominal   Peds  Hematology negative hematology ROS (+)   Anesthesia Other Findings   Reproductive/Obstetrics                            Anesthesia Physical Anesthesia Plan  ASA: III  Anesthesia Plan: General   Post-op Pain Management:    Induction: Intravenous  PONV Risk Score and Plan: 3 and Propofol infusion, TIVA and Treatment may vary due to age or medical condition  Airway Management Planned: Natural Airway  Additional Equipment:   Intra-op Plan:   Post-operative Plan:   Informed Consent: I have reviewed the patients History and Physical, chart, labs and discussed the procedure including the risks, benefits and alternatives for the proposed anesthesia with the patient or authorized representative who has indicated his/her understanding and acceptance.       Plan Discussed with: CRNA  Anesthesia Plan Comments:        Anesthesia Quick Evaluation

## 2019-04-29 NOTE — Discharge Instructions (Signed)

## 2019-05-02 ENCOUNTER — Ambulatory Visit: Payer: Medicare Other | Admitting: Anesthesiology

## 2019-05-02 ENCOUNTER — Ambulatory Visit
Admission: RE | Admit: 2019-05-02 | Discharge: 2019-05-02 | Disposition: A | Discharge status: Home / Self Care | Payer: Medicare Other | Attending: Gastroenterology | Admitting: Gastroenterology

## 2019-05-02 ENCOUNTER — Other Ambulatory Visit: Payer: Self-pay

## 2019-05-02 ENCOUNTER — Encounter
Admission: RE | Disposition: A | Discharge status: Home / Self Care | Payer: Self-pay | Source: Home / Self Care | Attending: Gastroenterology

## 2019-05-02 DIAGNOSIS — M792 Neuralgia and neuritis, unspecified: Secondary | ICD-10-CM | POA: Insufficient documentation

## 2019-05-02 DIAGNOSIS — J45909 Unspecified asthma, uncomplicated: Secondary | ICD-10-CM | POA: Insufficient documentation

## 2019-05-02 DIAGNOSIS — Z79899 Other long term (current) drug therapy: Secondary | ICD-10-CM | POA: Diagnosis not present

## 2019-05-02 DIAGNOSIS — M1990 Unspecified osteoarthritis, unspecified site: Secondary | ICD-10-CM | POA: Diagnosis not present

## 2019-05-02 DIAGNOSIS — Z7982 Long term (current) use of aspirin: Secondary | ICD-10-CM | POA: Insufficient documentation

## 2019-05-02 DIAGNOSIS — E785 Hyperlipidemia, unspecified: Secondary | ICD-10-CM | POA: Diagnosis not present

## 2019-05-02 DIAGNOSIS — I10 Essential (primary) hypertension: Secondary | ICD-10-CM | POA: Insufficient documentation

## 2019-05-02 DIAGNOSIS — F419 Anxiety disorder, unspecified: Secondary | ICD-10-CM | POA: Insufficient documentation

## 2019-05-02 DIAGNOSIS — Z6841 Body Mass Index (BMI) 40.0 and over, adult: Secondary | ICD-10-CM | POA: Diagnosis not present

## 2019-05-02 DIAGNOSIS — E119 Type 2 diabetes mellitus without complications: Secondary | ICD-10-CM | POA: Insufficient documentation

## 2019-05-02 DIAGNOSIS — Z96652 Presence of left artificial knee joint: Secondary | ICD-10-CM | POA: Insufficient documentation

## 2019-05-02 DIAGNOSIS — K219 Gastro-esophageal reflux disease without esophagitis: Secondary | ICD-10-CM | POA: Insufficient documentation

## 2019-05-02 DIAGNOSIS — D120 Benign neoplasm of cecum: Secondary | ICD-10-CM | POA: Insufficient documentation

## 2019-05-02 DIAGNOSIS — K640 First degree hemorrhoids: Secondary | ICD-10-CM | POA: Diagnosis not present

## 2019-05-02 DIAGNOSIS — K5730 Diverticulosis of large intestine without perforation or abscess without bleeding: Secondary | ICD-10-CM | POA: Insufficient documentation

## 2019-05-02 DIAGNOSIS — Z888 Allergy status to other drugs, medicaments and biological substances status: Secondary | ICD-10-CM | POA: Diagnosis not present

## 2019-05-02 DIAGNOSIS — Z1211 Encounter for screening for malignant neoplasm of colon: Secondary | ICD-10-CM

## 2019-05-02 HISTORY — DX: Presence of dental prosthetic device (complete) (partial): Z97.2

## 2019-05-02 HISTORY — PX: POLYPECTOMY: SHX5525

## 2019-05-02 HISTORY — PX: COLONOSCOPY WITH PROPOFOL: SHX5780

## 2019-05-02 SURGERY — COLONOSCOPY WITH PROPOFOL
Anesthesia: General | Site: Rectum

## 2019-05-02 MED ORDER — LACTATED RINGERS IV SOLN
10.0000 mL/h | INTRAVENOUS | Status: DC
  Administered 2019-05-02: 08:00:00 10 mL/h via INTRAVENOUS

## 2019-05-02 MED ORDER — PROPOFOL 10 MG/ML IV BOLUS
INTRAVENOUS | Status: DC | PRN
  Administered 2019-05-02: 70 mg via INTRAVENOUS
  Administered 2019-05-02: 20 mg via INTRAVENOUS
  Administered 2019-05-02: 50 mg via INTRAVENOUS

## 2019-05-02 MED ORDER — STERILE WATER FOR IRRIGATION IR SOLN
Status: DC | PRN
  Administered 2019-05-02: 09:00:00 100 mL

## 2019-05-02 MED ORDER — ATORVASTATIN CALCIUM 80 MG PO TABS: 80.0000 mg | ORAL_TABLET | Freq: Every day | ORAL | 3 refills | Status: DC

## 2019-05-02 MED ORDER — LIDOCAINE HCL (CARDIAC) PF 100 MG/5ML IV SOSY
PREFILLED_SYRINGE | INTRAVENOUS | Status: DC | PRN
  Administered 2019-05-02: 60 mg via INTRAVENOUS

## 2019-05-02 SURGICAL SUPPLY — 16 items
CANISTER SUCT 1200ML W/VALVE (MISCELLANEOUS) ×3 IMPLANT
CLIP HMST 235XBRD CATH ROT (MISCELLANEOUS) IMPLANT
CLIP RESOLUTION 360 11X235 (MISCELLANEOUS)
ELECT REM PT RETURN 9FT ADLT (ELECTROSURGICAL)
ELECTRODE REM PT RTRN 9FT ADLT (ELECTROSURGICAL) IMPLANT
FORCEPS BIOP RAD 4 LRG CAP 4 (CUTTING FORCEPS) ×2 IMPLANT
GOWN CVR UNV OPN BCK APRN NK (MISCELLANEOUS) ×2 IMPLANT
GOWN ISOL THUMB LOOP REG UNIV (MISCELLANEOUS) ×6
INJECTOR VARIJECT VIN23 (MISCELLANEOUS) IMPLANT
KIT ENDO PROCEDURE OLY (KITS) ×3 IMPLANT
MARKER SPOT ENDO TATTOO 5ML (MISCELLANEOUS) IMPLANT
SNARE SHORT THROW 13M SML OVAL (MISCELLANEOUS) IMPLANT
SPOT EX ENDOSCOPIC TATTOO (MISCELLANEOUS)
TRAP ETRAP POLY (MISCELLANEOUS) IMPLANT
VARIJECT INJECTOR VIN23 (MISCELLANEOUS)
WATER STERILE IRR 250ML POUR (IV SOLUTION) ×3 IMPLANT

## 2019-05-02 NOTE — Telephone Encounter (Signed)
Patient advised as below. Sent in atorvastatin 80 mg to pharmacy.    Notes recorded by Virginia Crews, MD on 04/15/2019 at 4:45 PM EDT  Normal/stable labs, except cholesterol is not quite to goal in the setting of diabetes. Is she taking her atorvastatin regularly? If she is, would recommend increasing to 80 mg daily. Okay to send a new prescription for 90-day supply with 3 refills if she agrees. Would recheck her lipid panel and metabolic panel again in 3 to 6 months at next visit.

## 2019-05-02 NOTE — H&P (Signed)
Lucilla Lame, MD [facility] [address], [place], [place] [address] Phone: [phone] Fax : [phone]  Primary Care Physician:  Virginia Crews, MD Primary Gastroenterologist:  Dr. Allen Norris  Pre-Procedure History & Physical: HPI:  [name] is a 68 y.o. female is here for a screening colonoscopy.   Past Medical History:  Diagnosis Date  . Anxiety   . Arthritis   . Asthma   . Bronchitis 09/04/2016   urgent care  . Diabetes mellitus without complication ([place])   . Elevated C-reactive protein (CRP) 10/06/2016  . GERD (gastroesophageal reflux disease)   . History of shingles   . Hyperlipidemia   . Hypertension   . Neuralgia    Right side of head and face, s/p shingles  . Shingles    chronic on Rt side of head and face  . Wears dentures    partial upper    Past Surgical History:  Procedure Laterality Date  . ABDOMINAL HYSTERECTOMY    . BREAST BIOPSY Right 2016   benign- tophat clip  . BREAST BIOPSY Left 04/08/2016   -benign-fibroadenomatous changes- tophat clip  . BREAST LUMPECTOMY Right 03/2016  . BREAST LUMPECTOMY WITH NEEDLE LOCALIZATION Right 05/02/2016   Procedure: BREAST LUMPECTOMY WITH NEEDLE LOCALIZATION;  Surgeon: Clayburn Pert, MD;  Location: [facility] ORS;  Service: General;  Laterality: Right;  . CHOLECYSTECTOMY    . KNEE ARTHROPLASTY Left 12/01/2016   Procedure: COMPUTER ASSISTED TOTAL KNEE ARTHROPLASTY;  Surgeon: Dereck Leep, MD;  Location: [facility] ORS;  Service: Orthopedics;  Laterality: Left;  . TOTAL KNEE ARTHROPLASTY Right 2014  . TOTAL KNEE ARTHROPLASTY Right 02/16/2013    Prior to Admission medications   Medication Sig Start Date End Date Taking? Authorizing Provider  aspirin EC 81 MG tablet Take 81 mg by mouth daily.   Yes [provider]  atorvastatin (LIPITOR) 40 MG tablet Take 1 tablet (40 mg total) by mouth daily. 09/17/18  Yes Bacigalupo, Dionne Bucy, MD  gabapentin (NEURONTIN) 600 MG tablet Take 1.5 tablets (900 mg total) by  mouth every 6 (six) hours. 09/17/18 04/21/19 Yes Bacigalupo, Dionne Bucy, MD  hydrochlorothiazide (HYDRODIURIL) 12.5 MG tablet Take 1 tablet (12.5 mg total) by mouth daily. 09/17/18  Yes Bacigalupo, Dionne Bucy, MD  hydrocortisone (ANUSOL-HC) 2.5 % rectal cream Place 1 application rectally 2 (two) times daily. 04/14/19  Yes Bacigalupo, Dionne Bucy, MD  losartan (COZAAR) 100 MG tablet Take 1 tablet (100 mg total) by mouth daily. 09/17/18  Yes Bacigalupo, Dionne Bucy, MD  nortriptyline (PAMELOR) 75 MG capsule Take 1 capsule (75 mg total) by mouth at bedtime. 09/17/18  Yes Bacigalupo, Dionne Bucy, MD  omeprazole (PRILOSEC) 20 MG capsule Take 1 capsule (20 mg total) by mouth daily. 09/17/18  Yes Bacigalupo, Dionne Bucy, MD  VITAMIN D PO Take by mouth daily.   Yes [provider]  carbamazepine (TEGRETOL) 200 MG tablet Take 200 mg by mouth at bedtime.     [provider]    Allergies as of 04/14/2019 - Review Complete 04/14/2019  Allergen Reaction Noted  . Lisinopril Cough 10/31/2014  . Lovastatin Other (See Comments) 10/31/2014  . Shellfish allergy Rash 04/02/2016    Family History  Problem Relation Age of Onset  . Anuerysm Mother        Brain  . Dementia Mother   . Diabetes Mother   . Heart failure Father   . CAD Father   . Dementia Father   . Diabetes Father   . Lupus Sister   .  Throat cancer Maternal Grandfather   . Diabetes Brother   . Breast cancer Neg Hx     Social History   Socioeconomic History  . Marital status: Widowed    Spouse name: Not on file  . Number of children: 2  . Years of education: Not on file  . Highest education level: High school graduate  Occupational History  . Occupation: retired  Scientific laboratory technician  . Financial resource strain: Not hard at all  . Food insecurity    Worry: Never true    Inability: Never true  . Transportation needs    Medical: No    Non-medical: No  Tobacco Use  . Smoking status: Never Smoker  . Smokeless tobacco: Never Used  Substance and  Sexual Activity  . Alcohol use: No    Alcohol/week: 0.0 standard drinks  . Drug use: No  . Sexual activity: Not on file  Lifestyle  . Physical activity    Days per week: 0 days    Minutes per session: 0 min  . Stress: Only a little  Relationships  . Social Herbalist on phone: Patient refused    Gets together: Patient refused    Attends religious service: Patient refused    Active member of club or organization: Patient refused    Attends meetings of clubs or organizations: Patient refused    Relationship status: Patient refused  . Intimate partner violence    Fear of current or ex partner: Patient refused    Emotionally abused: Patient refused    Physically abused: Patient refused    Forced sexual activity: Patient refused  Other Topics Concern  . Not on file  Social History Narrative  . Not on file    Review of Systems: See HPI, otherwise negative ROS  Physical Exam: BP (!) 139/93   Pulse (!) 111   Temp 97.9 F (36.6 C) (Temporal)   Resp 15   Ht 5\' 3"  (1.6 m)   Wt 102.5 kg   SpO2 99%   BMI 40.03 kg/m  General:   Alert,  pleasant and cooperative in NAD Head:  Normocephalic and atraumatic. Neck:  Supple; no masses or thyromegaly. Lungs:  Clear throughout to auscultation.    Heart:  Regular rate and rhythm. Abdomen:  Soft, nontender and nondistended. Normal bowel sounds, without guarding, and without rebound.   Neurologic:  Alert and  oriented x4;  grossly normal neurologically.  Impression/Plan: [name] is now here to undergo a screening colonoscopy.  Risks, benefits, and alternatives regarding colonoscopy have been reviewed with the patient.  Questions have been answered.  All parties agreeable.

## 2019-05-02 NOTE — Op Note (Signed)
[facility] Gastroenterology Patient Name: [name] Procedure Date: 05/02/2019 8:19 AM MRN: [id number] Account #: [id number] Date of Birth: 09/09/1950 Admit Type: Outpatient Age: 68 Room: [facility] OR ROOM 01 Gender: Female Note Status: Finalized Procedure:             Colonoscopy Indications:           Screening for colorectal malignant neoplasm Providers:             Lucilla Lame MD, MD Referring MD:          Dionne Bucy. Bacigalupo (Referring MD) Medicines:             Propofol per Anesthesia Complications:         No immediate complications. Procedure:             Pre-Anesthesia Assessment:                        - Prior to the procedure, a History and Physical was                         performed, and patient medications and allergies were                         reviewed. The patient's tolerance of previous                         anesthesia was also reviewed. The risks and benefits                         of the procedure and the sedation options and risks                         were discussed with the patient. All questions were                         answered, and informed consent was obtained. Prior                         Anticoagulants: The patient has taken no previous                         anticoagulant or antiplatelet agents. ASA Grade                         Assessment: III - A patient with severe systemic                         disease. After reviewing the risks and benefits, the                         patient was deemed in satisfactory condition to                         undergo the procedure.                        After obtaining informed consent, the colonoscope was  passed under direct vision. Throughout the procedure,                         the patient's blood pressure, pulse, and oxygen                         saturations were monitored continuously. The was                         introduced through the anus and  advanced to the the                         cecum, identified by appendiceal orifice and ileocecal                         valve. The colonoscopy was performed without                         difficulty. The patient tolerated the procedure well.                         The quality of the bowel preparation was excellent. Findings:      The perianal and digital rectal examinations were normal.      A 2 mm polyp was found in the cecum. The polyp was sessile. The polyp       was removed with a cold biopsy forceps. Resection and retrieval were       complete.      A single small-mouthed diverticulum was found in the ascending colon.      Non-bleeding internal hemorrhoids were found during retroflexion. The       hemorrhoids were Grade I (internal hemorrhoids that do not prolapse). Impression:            - One 2 mm polyp in the cecum, removed with a cold                         biopsy forceps. Resected and retrieved.                        - Diverticulosis in the ascending colon.                        - Non-bleeding internal hemorrhoids. Recommendation:        - Discharge patient to home.                        - Resume previous diet.                        - Continue present medications.                        - Repeat colonoscopy in 5 years if polyp adenoma and                         10 years if hyperplastic Procedure Code(s):     --- Professional ---                        [phone], Colonoscopy, flexible;  with biopsy, single or                         multiple Diagnosis Code(s):     --- Professional ---                        Z12.11, Encounter for screening for malignant neoplasm                         of colon                        K63.5, Polyp of colon CPT copyright 2019 American Medical Association. All rights reserved. The codes documented in this report are preliminary and upon coder review may  be revised to meet current compliance requirements. Lucilla Lame MD, MD 05/02/2019  8:46:26 AM This report has been signed electronically. Number of Addenda: 0 Note Initiated On: 05/02/2019 8:19 AM Scope Withdrawal Time: 0 hours 12 minutes 28 seconds  Total Procedure Duration: 0 hours 15 minutes 5 seconds  Estimated Blood Loss:  Estimated blood loss: none.      [facility]

## 2019-05-02 NOTE — Transfer of Care (Signed)
Immediate Anesthesia Transfer of Care Note  Patient: [name]  Procedure(s) Performed: COLONOSCOPY WITH BIOPSY (N/A Rectum) POLYPECTOMY (N/A Rectum)  Patient Location: PACU  Anesthesia Type: General  Level of Consciousness: awake, alert  and patient cooperative  Airway and Oxygen Therapy: Patient Spontanous Breathing and Patient connected to supplemental oxygen  Post-op Assessment: Post-op Vital signs reviewed, Patient's Cardiovascular Status Stable, Respiratory Function Stable, Patent Airway and No signs of Nausea or vomiting  Post-op Vital Signs: Reviewed and stable  Complications: No apparent anesthesia complications

## 2019-05-02 NOTE — Anesthesia Postprocedure Evaluation (Signed)
Anesthesia Post Note  Patient: [name]  Procedure(s) Performed: COLONOSCOPY WITH BIOPSY (N/A Rectum) POLYPECTOMY (N/A Rectum)     Patient location during evaluation: PACU Anesthesia Type: General Level of consciousness: awake and alert, oriented and patient cooperative Pain management: pain level controlled Vital Signs Assessment: post-procedure vital signs reviewed and stable Respiratory status: spontaneous breathing, nonlabored ventilation and respiratory function stable Cardiovascular status: blood pressure returned to baseline and stable Postop Assessment: adequate PO intake Anesthetic complications: no    Darrin Nipper

## 2019-05-03 ENCOUNTER — Encounter: Payer: Self-pay | Admitting: Gastroenterology

## 2019-05-04 ENCOUNTER — Encounter: Payer: Self-pay | Admitting: Gastroenterology

## 2019-05-10 DIAGNOSIS — B0229 Other postherpetic nervous system involvement: Secondary | ICD-10-CM | POA: Diagnosis not present

## 2019-05-10 DIAGNOSIS — Z79899 Other long term (current) drug therapy: Secondary | ICD-10-CM | POA: Diagnosis not present

## 2019-05-10 DIAGNOSIS — E538 Deficiency of other specified B group vitamins: Secondary | ICD-10-CM | POA: Diagnosis not present

## 2019-05-16 ENCOUNTER — Ambulatory Visit: Payer: Medicare Other | Admitting: Family Medicine

## 2019-06-02 ENCOUNTER — Ambulatory Visit: Payer: Medicare Other | Admitting: Family Medicine

## 2019-06-02 ENCOUNTER — Other Ambulatory Visit: Payer: Self-pay

## 2019-06-02 ENCOUNTER — Encounter: Payer: Self-pay | Admitting: Family Medicine

## 2019-06-02 ENCOUNTER — Ambulatory Visit (INDEPENDENT_AMBULATORY_CARE_PROVIDER_SITE_OTHER): Payer: Medicare Other | Admitting: Family Medicine

## 2019-06-02 VITALS — Temp 97.3°F

## 2019-06-02 DIAGNOSIS — Z23 Encounter for immunization: Secondary | ICD-10-CM

## 2019-06-02 NOTE — Progress Notes (Signed)
Patient here for Shingrix vaccination only.  I did not examine the patient.  I did review his medical history, medications, and allergies and vaccine consent form.  CMA gave vaccination. Patient tolerated well.  Virginia Crews, MD, MPH [facility] 06/02/2019 2:05 PM

## 2019-08-02 DIAGNOSIS — G43719 Chronic migraine without aura, intractable, without status migrainosus: Secondary | ICD-10-CM | POA: Diagnosis not present

## 2019-08-04 ENCOUNTER — Ambulatory Visit: Payer: Self-pay | Admitting: Family Medicine

## 2019-08-04 DIAGNOSIS — G43719 Chronic migraine without aura, intractable, without status migrainosus: Secondary | ICD-10-CM | POA: Insufficient documentation

## 2019-08-08 ENCOUNTER — Other Ambulatory Visit: Payer: Self-pay

## 2019-08-08 ENCOUNTER — Encounter: Payer: Self-pay | Admitting: Family Medicine

## 2019-08-08 ENCOUNTER — Ambulatory Visit (INDEPENDENT_AMBULATORY_CARE_PROVIDER_SITE_OTHER): Payer: Medicare Other | Admitting: Family Medicine

## 2019-08-08 DIAGNOSIS — Z23 Encounter for immunization: Secondary | ICD-10-CM | POA: Diagnosis not present

## 2019-08-08 MED ORDER — PREGABALIN 300 MG PO CAPS: 300.0000 mg | ORAL_CAPSULE | Freq: Two times a day (BID) | ORAL | 1 refills | Status: DC

## 2019-08-08 NOTE — Progress Notes (Signed)
Patient here for Shingrix vaccination only.  I did not examine the patient.  I did review his medical history, medications, and allergies and vaccine consent form.  CMA gave vaccination. Patient tolerated well.  Virginia Crews, MD, MPH [facility] 08/08/2019 1:32 PM

## 2019-08-09 DIAGNOSIS — G43719 Chronic migraine without aura, intractable, without status migrainosus: Secondary | ICD-10-CM | POA: Diagnosis not present

## 2019-08-16 DIAGNOSIS — G43719 Chronic migraine without aura, intractable, without status migrainosus: Secondary | ICD-10-CM | POA: Diagnosis not present

## 2019-09-30 DIAGNOSIS — B0229 Other postherpetic nervous system involvement: Secondary | ICD-10-CM | POA: Diagnosis not present

## 2019-10-07 DIAGNOSIS — E782 Mixed hyperlipidemia: Secondary | ICD-10-CM | POA: Diagnosis not present

## 2019-10-07 DIAGNOSIS — R0602 Shortness of breath: Secondary | ICD-10-CM | POA: Diagnosis not present

## 2019-10-07 DIAGNOSIS — Z6841 Body Mass Index (BMI) 40.0 and over, adult: Secondary | ICD-10-CM | POA: Diagnosis not present

## 2019-10-07 DIAGNOSIS — I10 Essential (primary) hypertension: Secondary | ICD-10-CM | POA: Diagnosis not present

## 2019-10-07 DIAGNOSIS — R002 Palpitations: Secondary | ICD-10-CM | POA: Diagnosis not present

## 2019-10-07 DIAGNOSIS — Z7689 Persons encountering health services in other specified circumstances: Secondary | ICD-10-CM | POA: Diagnosis not present

## 2019-10-07 DIAGNOSIS — E1142 Type 2 diabetes mellitus with diabetic polyneuropathy: Secondary | ICD-10-CM | POA: Diagnosis not present

## 2019-10-11 NOTE — Progress Notes (Deleted)
Established patient visit   Patient: [name]   DOB: August 02, 1950   69 y.o. Female  MRN: [id number] Visit Date: 10/11/2019  Today's healthcare provider: Lavon Paganini, MD   No chief complaint on file.  Subjective    HPI Hypertension, follow-up  BP Readings from Last 3 Encounters:  05/02/19 114/75  04/14/19 112/76  01/10/19 140/82   Wt Readings from Last 3 Encounters:  05/02/19 226 lb (102.5 kg)  04/14/19 227 lb (103 kg)  01/10/19 231 lb (104.8 kg)     She was last seen for hypertension 6 months ago.  BP at that visit was 112/76. Management since that visit includes none. Well controlled.  She reports {excellent/good/fair/poor:19665} compliance with treatment. She {is/is not:9024} having side effects. {document side effects if present:1} She is following a {diet:21022986} diet. She {is/is not:9024} exercising. She {does/does not:200015} smoke.  Use of agents associated with hypertension: {bp agents assoc with hypertension:511::"none"}.   Outside blood pressures are {***enter patient reported home BP readings, or 'not being checked':1}.  Symptoms:  YES NO    []    []    Chest Pain   []    []    Chest pressure/discomfort   []    []    Palpitations   []    []    Dyspnea   []    []    Orthopnea   []    []    Paroxysmal nocturnal dyspnea   []   []    Lower extremity edema   []    []   Syncope   Pertinent labs: Lab Results  Component Value Date   CHOL 139 04/14/2019   HDL 41 04/14/2019   LDLCALC 75 04/14/2019   TRIG 130 04/14/2019   CHOLHDL 3.4 04/14/2019   Lab Results  Component Value Date   NA 137 04/14/2019   K 4.1 04/14/2019   CO2 22 04/14/2019   GLUCOSE 106 (H) 04/14/2019   BUN 13 04/14/2019   CREATININE 1.06 (H) 04/14/2019   CALCIUM 9.5 04/14/2019   GFRNONAA 53 (L) 04/14/2019   GFRAA 61 04/14/2019     The 10-year ASCVD risk score Mikey Bussing DC Jr., et al., 2013) is: 18.5%    --------------------------------------------------------------------------------------------------- Diabetes Mellitus Type II, Follow-up  Lab Results  Component Value Date   HGBA1C 6.6 (H) 04/14/2019   HGBA1C 6.7 (A) 10/12/2018   HGBA1C 6.8 (A) 04/09/2018   Last seen for diabetes 6 months ago. Controlled with diet. Not on medication Management since then includes none.Continue losartan for microalbuminuria Symptoms: {Yes/No:20286} fatigue {Yes/No:20286} foot ulcerations {Yes/No:20286} appetite changes {Yes/No:20286} nausea {Yes/No:20286} paresthesia (numbness or tingling) of the feet  {Yes/No:20286} polydipsia (excessive thirst) {Yes/No:20286} polyuria (frequent urination) {Yes/No:20286} visual disturbances  {Yes/No:20286} vomiting Lipid/Cholesterol, Follow-up  Last lipid panel Other pertinent labs  Lab Results  Component Value Date   CHOL 139 04/14/2019   HDL 41 04/14/2019   LDLCALC 75 04/14/2019   TRIG 130 04/14/2019   CHOLHDL 3.4 04/14/2019   Lab Results  Component Value Date   ALT 13 04/14/2019   AST 15 04/14/2019   PLT 292 04/14/2019   TSH 2.250 04/14/2019     She was last seen for this 6 months ago.  Management since that visit includes cholesterol is not quite to goal in the setting of diabetes .atorvastatin increase to 80 mg daily.  She reports {excellent/good/fair/poor:19665} compliance with treatment. She {is/is not:9024} having side effects. {document side effects if present:1} Symptoms: {Yes/No:20286} chest pain {Yes/No:20286} chest pressure/discomfort {Yes/No:20286} dyspnea {Yes/No:20286} lower extremity edema {Yes/No:20286} numbness or tingling  of extremity {Yes/No:20286} orthopnea {Yes/No:20286} palpitations {Yes/No:20286} paroxysmal nocturnal dyspnea {Yes/No:20286} speech difficulty {Yes/No:20286} syncope  Current diet: {diet habits:16563} Current exercise: {exercise types:16438}  Wt Readings from Last 3 Encounters:  05/02/19 226 lb  (102.5 kg)  04/14/19 227 lb (103 kg)  01/10/19 231 lb (104.8 kg)   The 10-year ASCVD risk score Mikey Bussing DC Jr., et al., 2013) is: 18.5%  -----------------------------------------------------------------------------------------  {Show patient history (optional):23778::" "}   Medications: Outpatient Medications Prior to Visit  Medication Sig  . aspirin EC 81 MG tablet Take 81 mg by mouth daily.  Marland Kitchen atorvastatin (LIPITOR) 80 MG tablet Take 1 tablet (80 mg total) by mouth daily.  . carbamazepine (TEGRETOL) 200 MG tablet Take 200 mg by mouth at bedtime.   . gabapentin (NEURONTIN) 600 MG tablet Take 1.5 tablets (900 mg total) by mouth every 6 (six) hours.  . hydrochlorothiazide (HYDRODIURIL) 12.5 MG tablet Take 1 tablet (12.5 mg total) by mouth daily.  . hydrocortisone (ANUSOL-HC) 2.5 % rectal cream Place 1 application rectally 2 (two) times daily.  Marland Kitchen losartan (COZAAR) 100 MG tablet Take 1 tablet (100 mg total) by mouth daily.  . nortriptyline (PAMELOR) 75 MG capsule Take 1 capsule (75 mg total) by mouth at bedtime.  Marland Kitchen omeprazole (PRILOSEC) 20 MG capsule Take 1 capsule (20 mg total) by mouth daily.  . pregabalin (LYRICA) 300 MG capsule Take 1 capsule (300 mg total) by mouth 2 (two) times daily.  Marland Kitchen VITAMIN D PO Take by mouth daily.   No facility-administered medications prior to visit.    Review of Systems  {Show previous labs (optional):23779::" "}   Objective    There were no vitals taken for this visit. {Show previous vital signs (optional):23777::" "}  Physical Exam  ***  No results found for any visits on 10/12/19.  Assessment & Plan    ***  No follow-ups on file.      {provider attestation***:1}   Lavon Paganini, MD  [facility] [phone] (phone) [phone] (fax)  [place]

## 2019-10-12 ENCOUNTER — Ambulatory Visit: Payer: Medicare Other | Admitting: Family Medicine

## 2019-10-25 ENCOUNTER — Telehealth: Payer: Self-pay | Admitting: Family Medicine

## 2019-10-25 NOTE — Chronic Care Management (AMB) (Signed)
  Chronic Care Management   Outreach Note  10/25/2019 Name: [name] MRN: [id number] DOB: 1951/05/26  [name] is a 69 y.o. year old female who is a primary care patient of Brita Romp, Dionne Bucy, MD. I reached out to Ronnie Doss by phone today in response to a referral sent by [name]'s health plan.     An unsuccessful telephone outreach was attempted today. The patient was referred to the case management team for assistance with care management and care coordination.   Follow Up Plan: The care management team will reach out to the patient again over the next 7 days.  If patient returns call to provider office, please advise to call [facility] at [place], [place], [place], [place] [address] Direct Dial: [phone] [email or web address]

## 2019-11-02 NOTE — Chronic Care Management (AMB) (Signed)
  Chronic Care Management   Outreach Note  11/02/2019 Name: JEYLAH HOWORTH MRN: [id number] DOB: 1950/10/03  SETH BORRUSO is a 69 y.o. year old female who is a primary care patient of Brita Romp, Dionne Bucy, MD. I reached out to Ronnie Doss by phone today in response to a referral sent by Ms. Etheleen Mayhew Helder's health plan.     A second unsuccessful telephone outreach was attempted today. The patient was referred to the case management team for assistance with care management and care coordination.   Follow Up Plan: A HIPPA compliant phone message was left for the patient providing contact information and requesting a return call.  The care management team will reach out to the patient again over the next 7 days.  If patient returns call to provider office, please advise to call [facility] at [place], [place], [place], [place] [address] Direct Dial: [phone] [email or web address]: [email or web address]

## 2019-11-03 ENCOUNTER — Other Ambulatory Visit: Payer: Self-pay | Admitting: Family Medicine

## 2019-11-03 DIAGNOSIS — I10 Essential (primary) hypertension: Secondary | ICD-10-CM

## 2019-11-03 DIAGNOSIS — B0229 Other postherpetic nervous system involvement: Secondary | ICD-10-CM

## 2019-11-03 DIAGNOSIS — M792 Neuralgia and neuritis, unspecified: Secondary | ICD-10-CM

## 2019-11-03 MED ORDER — HYDROCHLOROTHIAZIDE 12.5 MG PO TABS: 12.5000 mg | ORAL_TABLET | Freq: Every day | ORAL | 3 refills | Status: DC

## 2019-11-03 MED ORDER — OMEPRAZOLE 20 MG PO CPDR: 20.0000 mg | DELAYED_RELEASE_CAPSULE | Freq: Every day | ORAL | 3 refills | Status: DC

## 2019-11-03 MED ORDER — LOSARTAN POTASSIUM 100 MG PO TABS: 100.0000 mg | ORAL_TABLET | Freq: Every day | ORAL | 3 refills | Status: DC

## 2019-11-03 MED ORDER — NORTRIPTYLINE HCL 75 MG PO CAPS: 75.0000 mg | ORAL_CAPSULE | Freq: Every day | ORAL | 3 refills | Status: DC

## 2019-11-03 MED ORDER — GABAPENTIN 600 MG PO TABS: 900.0000 mg | ORAL_TABLET | Freq: Four times a day (QID) | ORAL | 3 refills | Status: DC

## 2019-11-03 NOTE — Telephone Encounter (Signed)
OK to send refills

## 2019-11-03 NOTE — Telephone Encounter (Signed)
Medication Refill - Medication: all medication  Has the patient contacted their pharmacy? No. (Agent: If no, request that the patient contact the pharmacy for the refill.) (Agent: If yes, when and what did the pharmacy advise?)  Preferred Pharmacy (with phone number or street name): [facility]  Agent: Please be advised that RX refills may take up to 3 business days. We ask that you follow-up with your pharmacy.

## 2019-11-04 DIAGNOSIS — R0602 Shortness of breath: Secondary | ICD-10-CM | POA: Diagnosis not present

## 2019-11-07 DIAGNOSIS — B0229 Other postherpetic nervous system involvement: Secondary | ICD-10-CM | POA: Diagnosis not present

## 2019-11-08 NOTE — Chronic Care Management (AMB) (Signed)
  Chronic Care Management   Outreach Note  11/08/2019 Name: [name] MRN: [id number] DOB: [birth year]  [name] is a 69 y.o. year old female who is a primary care patient of Brita Romp, Dionne Bucy, MD. I reached out to Ronnie Doss by phone today in response to a referral sent by Ms. Etheleen Mayhew Lynne's health plan.     Third unsuccessful telephone outreach was attempted today. The patient was referred to the case management team for assistance with care management and care coordination. The patient's primary care provider has been notified of our unsuccessful attempts to make or maintain contact with the patient. The care management team is pleased to engage with this patient at any time in the future should he/she be interested in assistance from the care management team.   Follow Up Plan: A HIPPA compliant phone message was left for the patient providing contact information and requesting a return call.  The care management team is available to follow up with the patient after provider conversation with the patient regarding recommendation for care management engagement and subsequent re-referral to the care management team.   Noreene Larsson, [place], [place], [place] [address] Direct Dial: [phone] [email or web address] Website: [email or web address]

## 2020-01-17 DIAGNOSIS — Z96653 Presence of artificial knee joint, bilateral: Secondary | ICD-10-CM | POA: Diagnosis not present

## 2020-01-17 DIAGNOSIS — D120 Benign neoplasm of cecum: Secondary | ICD-10-CM

## 2020-02-16 DIAGNOSIS — E538 Deficiency of other specified B group vitamins: Secondary | ICD-10-CM | POA: Diagnosis not present

## 2020-03-06 ENCOUNTER — Other Ambulatory Visit: Payer: Self-pay

## 2020-03-06 ENCOUNTER — Ambulatory Visit (INDEPENDENT_AMBULATORY_CARE_PROVIDER_SITE_OTHER): Payer: Medicare Other | Admitting: Family Medicine

## 2020-03-06 ENCOUNTER — Encounter: Payer: Self-pay | Admitting: Family Medicine

## 2020-03-06 VITALS — BP 129/85 | HR 78 | Temp 98.8°F | Wt 235.0 lb

## 2020-03-06 DIAGNOSIS — B0222 Postherpetic trigeminal neuralgia: Secondary | ICD-10-CM

## 2020-03-06 DIAGNOSIS — E559 Vitamin D deficiency, unspecified: Secondary | ICD-10-CM | POA: Diagnosis not present

## 2020-03-06 DIAGNOSIS — E1169 Type 2 diabetes mellitus with other specified complication: Secondary | ICD-10-CM | POA: Diagnosis not present

## 2020-03-06 DIAGNOSIS — Z0000 Encounter for general adult medical examination without abnormal findings: Secondary | ICD-10-CM

## 2020-03-06 DIAGNOSIS — E1129 Type 2 diabetes mellitus with other diabetic kidney complication: Secondary | ICD-10-CM | POA: Diagnosis not present

## 2020-03-06 DIAGNOSIS — E785 Hyperlipidemia, unspecified: Secondary | ICD-10-CM | POA: Diagnosis not present

## 2020-03-06 DIAGNOSIS — Z79899 Other long term (current) drug therapy: Secondary | ICD-10-CM | POA: Diagnosis not present

## 2020-03-06 DIAGNOSIS — I10 Essential (primary) hypertension: Secondary | ICD-10-CM

## 2020-03-06 DIAGNOSIS — R809 Proteinuria, unspecified: Secondary | ICD-10-CM

## 2020-03-06 DIAGNOSIS — Z23 Encounter for immunization: Secondary | ICD-10-CM | POA: Diagnosis not present

## 2020-03-06 DIAGNOSIS — Z1231 Encounter for screening mammogram for malignant neoplasm of breast: Secondary | ICD-10-CM

## 2020-03-06 NOTE — Assessment & Plan Note (Signed)
Discussed importance of good diabetes control Continue losartan

## 2020-03-06 NOTE — Assessment & Plan Note (Signed)
Previously well controlled Goal LDL less than 70 in the setting of diabetes Continue statin at current dose Recheck FLP and CMP

## 2020-03-06 NOTE — Assessment & Plan Note (Signed)
Previously well controlled Diet controlled with no medications Recheck A1c ROI sent for last eye exam Foot exam completed today Up-to-date on vaccinations Advised on diet and exercise On statin and ARB Follow-up in 6 months

## 2020-03-06 NOTE — Assessment & Plan Note (Signed)
Discussed importance of healthy weight management Discussed diet and exercise  

## 2020-03-06 NOTE — Patient Instructions (Signed)
Preventive Care 38 Years and Older, Female Preventive care refers to lifestyle choices and visits with your health care provider that can promote health and wellness. This includes:  A yearly physical exam. This is also called an annual well check.  Regular dental and eye exams.  Immunizations.  Screening for certain conditions.  Healthy lifestyle choices, such as diet and exercise. What can I expect for my preventive care visit? Physical exam Your health care provider will check:  Height and weight. These may be used to calculate body mass index (BMI), which is a measurement that tells if you are at a healthy weight.  Heart rate and blood pressure.  Your skin for abnormal spots. Counseling Your health care provider may ask you questions about:  Alcohol, tobacco, and drug use.  Emotional well-being.  Home and relationship well-being.  Sexual activity.  Eating habits.  History of falls.  Memory and ability to understand (cognition).  Work and work Statistician.  Pregnancy and menstrual history. What immunizations do I need?  Influenza (flu) vaccine  This is recommended every year. Tetanus, diphtheria, and pertussis (Tdap) vaccine  You may need a Td booster every 10 years. Varicella (chickenpox) vaccine  You may need this vaccine if you have not already been vaccinated. Zoster (shingles) vaccine  You may need this after age 71. Pneumococcal conjugate (PCV13) vaccine  One dose is recommended after age 71. Pneumococcal polysaccharide (PPSV23) vaccine  One dose is recommended after age 71. Measles, mumps, and rubella (MMR) vaccine  You may need at least one dose of MMR if you were born in 1957 or later. You may also need a second dose. Meningococcal conjugate (MenACWY) vaccine  You may need this if you have certain conditions. Hepatitis A vaccine  You may need this if you have certain conditions or if you travel or work in places where you may be exposed  to hepatitis A. Hepatitis B vaccine  You may need this if you have certain conditions or if you travel or work in places where you may be exposed to hepatitis B. Haemophilus influenzae type b (Hib) vaccine  You may need this if you have certain conditions. You may receive vaccines as individual doses or as more than one vaccine together in one shot (combination vaccines). Talk with your health care provider about the risks and benefits of combination vaccines. What tests do I need? Blood tests  Lipid and cholesterol levels. These may be checked every 5 years, or more frequently depending on your overall health.  Hepatitis C test.  Hepatitis B test. Screening  Lung cancer screening. You may have this screening every year starting at age 71 if you have a 30-pack-year history of smoking and currently smoke or have quit within the past 15 years.  Colorectal cancer screening. All adults should have this screening starting at age 71 and continuing until age 15. Your health care provider may recommend screening at age 71 if you are at increased risk. You will have tests every 1-10 years, depending on your results and the type of screening test.  Diabetes screening. This is done by checking your blood sugar (glucose) after you have not eaten for a while (fasting). You may have this done every 1-3 years.  Mammogram. This may be done every 1-2 years. Talk with your health care provider about how often you should have regular mammograms.  BRCA-related cancer screening. This may be done if you have a family history of breast, ovarian, tubal, or peritoneal cancers.  Other tests  Sexually transmitted disease (STD) testing.  Bone density scan. This is done to screen for osteoporosis. You may have this done starting at age 71. Follow these instructions at home: Eating and drinking  Eat a diet that includes fresh fruits and vegetables, whole grains, lean protein, and low-fat dairy products. Limit  your intake of foods with high amounts of sugar, saturated fats, and salt.  Take vitamin and mineral supplements as recommended by your health care provider.  Do not drink alcohol if your health care provider tells you not to drink.  If you drink alcohol: ? Limit how much you have to 0-1 drink a day. ? Be aware of how much alcohol is in your drink. In the [place] one drink equals one 12 oz bottle of beer (355 mL), one 5 oz glass of wine (148 mL), or one 1 oz glass of hard liquor (44 mL). Lifestyle  Take daily care of your teeth and gums.  Stay active. Exercise for at least 30 minutes on 5 or more days each week.  Do not use any products that contain nicotine or tobacco, such as cigarettes, e-cigarettes, and chewing tobacco. If you need help quitting, ask your health care provider.  If you are sexually active, practice safe sex. Use a condom or other form of protection in order to prevent STIs (sexually transmitted infections).  Talk with your health care provider about taking a low-dose aspirin or statin. What's next?  Go to your health care provider once a year for a well check visit.  Ask your health care provider how often you should have your eyes and teeth checked.  Stay up to date on all vaccines. This information is not intended to replace advice given to you by your health care provider. Make sure you discuss any questions you have with your health care provider. Document Revised: 05/27/2018 Document Reviewed: 05/27/2018 Elsevier Patient Education  2020 [employer].

## 2020-03-06 NOTE — Assessment & Plan Note (Signed)
Recheck level Continue OTC supplementation

## 2020-03-06 NOTE — Progress Notes (Signed)
Annual Wellness Visit     Patient: [name], Female    DOB: 01/10/1951, 69 y.o.   MRN: [id number] Visit Date: 03/06/2020  Today's Provider: Lavon Paganini, MD   Chief Complaint  Patient presents with  . Annual Exam   Subjective    [name] is a 69 y.o. female who presents today for her Annual Wellness Visit. She reports consuming a general and low fat diet.  She generally feels well. She reports sleeping fairly well. She does not have additional problems to discuss today.   HPI   Patient Active Problem List   Diagnosis Date Noted  . Hyperlipidemia associated with type 2 diabetes mellitus ([place]) 03/06/2020  . Special screening for malignant neoplasms, colon   . Benign neoplasm of cecum   . Bilateral bunions 10/12/2018  . Change in bowel habits 01/29/2018  . Rectal irritation 01/29/2018  . Postnasal drip 11/04/2017  . Other viral warts 11/04/2017  . Acute pain of both knees 08/07/2017  . Microalbuminuria due to type 2 diabetes mellitus ([place]) 03/26/2017  . Primary osteoarthritis involving multiple joints 12/05/2016  . Status post total left knee replacement 12/01/2016  . Primary osteoarthritis of left knee 09/07/2016  . Hypertension 07/24/2016  . Occipital headache 01/21/2016  . Bilateral occipital neuralgia 01/21/2016  . Chronic pain syndrome 05/07/2015  . Gasserian ganglionitis 03/12/2015  . Neuropathic pain 03/12/2015  . Herpes zoster ophthalmicus 03/12/2015  . Anxiety 01/11/2015  . Diabetes ([place]) 01/11/2015  . Photopsia 01/11/2015  . Avitaminosis A 01/11/2015  . Depression 01/10/2015  . Morbid obesity ([place]) 04/18/2014  . Neuropathic postherpetic trigeminal neuralgia (Location of Primary Source of Pain) (Right) (V1) 12/21/2013  . Cannot sleep 03/13/2009  . Asthma 02/07/2009  . Calcium deficiency disease 10/16/2008  . Avitaminosis D 07/05/2008  . History of colon polyps 10/17/2005   Past Surgical History:  Procedure Laterality Date  .  ABDOMINAL HYSTERECTOMY    . BREAST BIOPSY Right 2016   benign- tophat clip  . BREAST BIOPSY Left 04/08/2016   -benign-fibroadenomatous changes- tophat clip  . BREAST LUMPECTOMY Right 03/2016  . BREAST LUMPECTOMY WITH NEEDLE LOCALIZATION Right 05/02/2016   Procedure: BREAST LUMPECTOMY WITH NEEDLE LOCALIZATION;  Surgeon: Clayburn Pert, MD;  Location: [facility];  Service: General;  Laterality: Right;  . CHOLECYSTECTOMY    . COLONOSCOPY WITH PROPOFOL N/A 05/02/2019   Procedure: COLONOSCOPY WITH BIOPSY;  Surgeon: Lucilla Lame, MD;  Location: [place];  Service: Endoscopy;  Laterality: N/A;  diabetic - diet controlled  . KNEE ARTHROPLASTY Left 12/01/2016   Procedure: COMPUTER ASSISTED TOTAL KNEE ARTHROPLASTY;  Surgeon: Dereck Leep, MD;  Location: [facility];  Service: Orthopedics;  Laterality: Left;  . POLYPECTOMY N/A 05/02/2019   Procedure: POLYPECTOMY;  Surgeon: Lucilla Lame, MD;  Location: [facility];  Service: Endoscopy;  Laterality: N/A;  . TOTAL KNEE ARTHROPLASTY Right 2014  . TOTAL KNEE ARTHROPLASTY Right 02/16/2013   Social History   Tobacco Use  . Smoking status: Never Smoker  . Smokeless tobacco: Never Used  Vaping Use  . Vaping Use: Never used  Substance Use Topics  . Alcohol use: No    Alcohol/week: 0.0 standard drinks  . Drug use: No   Allergies  Allergen Reactions  . Lisinopril Cough  . Lovastatin Other (See Comments)    Myalgia  . Shellfish Allergy Rash     Medications: Outpatient Medications Prior to Visit  Medication Sig  . aspirin EC 81 MG tablet Take 81 mg by  mouth daily.  Marland Kitchen atorvastatin (LIPITOR) 80 MG tablet Take 1 tablet (80 mg total) by mouth daily.  . hydrochlorothiazide (HYDRODIURIL) 12.5 MG tablet Take 1 tablet (12.5 mg total) by mouth daily.  . hydrocortisone (ANUSOL-HC) 2.5 % rectal cream Place 1 application rectally 2 (two) times daily.  Marland Kitchen losartan (COZAAR) 100 MG tablet Take 1 tablet (100 mg total) by mouth daily.  Marland Kitchen omeprazole  (PRILOSEC) 20 MG capsule Take 1 capsule (20 mg total) by mouth daily.  . pregabalin (LYRICA) 300 MG capsule Take 1 capsule (300 mg total) by mouth 2 (two) times daily.  Marland Kitchen VITAMIN D PO Take by mouth daily.  . [DISCONTINUED] carbamazepine (TEGRETOL) 200 MG tablet Take 200 mg by mouth at bedtime.   . [DISCONTINUED] nortriptyline (PAMELOR) 75 MG capsule Take 1 capsule (75 mg total) by mouth at bedtime.  . gabapentin (NEURONTIN) 600 MG tablet Take 1.5 tablets (900 mg total) by mouth every 6 (six) hours.   No facility-administered medications prior to visit.    Allergies  Allergen Reactions  . Lisinopril Cough  . Lovastatin Other (See Comments)    Myalgia  . Shellfish Allergy Rash    Patient Care Team: Virginia Crews, MD as PCP - General (Family Medicine) Milinda Pointer, MD as Referring Physician (Pain Medicine) Marry Guan Laurice Record, MD as Consulting Physician (Orthopedic Surgery)  Review of Systems  Constitutional: Negative.   HENT: Negative.   Eyes: Negative.   Respiratory: Negative.   Cardiovascular: Negative.   Gastrointestinal: Negative.   Endocrine: Negative.   Genitourinary: Negative.   Musculoskeletal: Negative.   Skin: Negative.   Allergic/Immunologic: Negative.   Neurological: Negative.   Hematological: Negative.   Psychiatric/Behavioral: Negative.       Objective    Vitals: BP 129/85 (BP Location: Left Arm, Patient Position: Sitting, Cuff Size: Large)   Pulse 78   Temp 98.8 F (37.1 C) (Oral)   Wt 235 lb (106.6 kg)   SpO2 100%   BMI 41.63 kg/m    Physical Exam Vitals reviewed.  Constitutional:      General: She is not in acute distress.    Appearance: Normal appearance. She is well-developed. She is not diaphoretic.  HENT:     Head: Normocephalic and atraumatic.     Right Ear: Tympanic membrane, ear canal and external ear normal.     Left Ear: Tympanic membrane, ear canal and external ear normal.  Eyes:     General: No scleral icterus.     Conjunctiva/sclera: Conjunctivae normal.     Pupils: Pupils are equal, round, and reactive to light.  Neck:     Thyroid: No thyromegaly.  Cardiovascular:     Rate and Rhythm: Normal rate and regular rhythm.     Pulses: Normal pulses.     Heart sounds: Normal heart sounds. No murmur heard.   Pulmonary:     Effort: Pulmonary effort is normal. No respiratory distress.     Breath sounds: Normal breath sounds. No wheezing or rales.  Abdominal:     General: There is no distension.     Palpations: Abdomen is soft.     Tenderness: There is no abdominal tenderness.  Musculoskeletal:        General: No deformity.     Cervical back: Neck supple.     Right lower leg: No edema.     Left lower leg: No edema.  Lymphadenopathy:     Cervical: No cervical adenopathy.  Skin:    General: Skin is warm and  dry.     Findings: No rash.  Neurological:     Mental Status: She is alert and oriented to person, place, and time. Mental status is at baseline.     Sensory: No sensory deficit.     Motor: No weakness.     Gait: Gait normal.  Psychiatric:        Mood and Affect: Mood normal.        Behavior: Behavior normal.        Thought Content: Thought content normal.      Most recent functional status assessment: In your present state of health, do you have any difficulty performing the following activities: 03/06/2020  Hearing? N  Vision? N  Difficulty concentrating or making decisions? N  Walking or climbing stairs? N  Dressing or bathing? N  Doing errands, shopping? N  Preparing Food and eating ? -  Using the Toilet? -  In the past six months, have you accidently leaked urine? -  Do you have problems with loss of bowel control? -  Managing your Medications? -  Managing your Finances? -  Housekeeping or managing your Housekeeping? -  Some recent data might be hidden   Most recent fall risk assessment: Fall Risk  03/06/2020  Falls in the past year? 0  Number falls in past yr: 0  Injury  with Fall? 0  Risk for fall due to : No Fall Risks  Risk for fall due to: Comment -  Follow up Follow up appointment    Most recent depression screenings: PHQ 2/9 Scores 03/06/2020 04/13/2019  PHQ - 2 Score 0 0  PHQ- 9 Score 0 -   Most recent cognitive screening: No flowsheet data found. Most recent Audit-C alcohol use screening Alcohol Use Disorder Test (AUDIT) 03/06/2020  1. How often do you have a drink containing alcohol? 0  2. How many drinks containing alcohol do you have on a typical day when you are drinking? 0  3. How often do you have six or more drinks on one occasion? 0  AUDIT-C Score 0  Alcohol Brief Interventions/Follow-up AUDIT Score <7 follow-up not indicated   A score of 3 or more in women, and 4 or more in men indicates increased risk for alcohol abuse, EXCEPT if all of the points are from question 1   No results found for any visits on 03/06/20.  Assessment & Plan     Annual wellness visit done today including the all of the following: Reviewed patient's Family Medical History Reviewed and updated list of patient's medical providers Assessment of cognitive impairment was done Assessed patient's functional ability Established a written schedule for health screening [place] Completed and Reviewed  Exercise Activities and Dietary recommendations Goals    . Decrease soda or juice intake     Recommend decreasing soda intake to 1 a day and increase water intake 6 glasses of water a day.     . Have 3 meals a day     Recommend to eat 3 small meals a day with 2 healthy snacks in between.    . Prevent falls     Recommend to remove any items from the home that may cause slips or trips.       Immunization History  Administered Date(s) Administered  . Fluad Quad(high Dose 65+) 04/05/2019  . Influenza Split 04/09/2010, 04/02/2012, 05/26/2012  . Influenza, High Dose Seasonal PF 04/04/2014, 02/29/2016, 02/24/2017, 04/09/2018  . Pneumococcal  Conjugate-13 02/24/2017  . Pneumococcal Polysaccharide-23  04/09/2018  . Tdap 07/25/2008  . Zoster Recombinat (Shingrix) 06/02/2019, 08/08/2019    Health Maintenance  Topic Date Due  . COVID-19 Vaccine (1) Never done  . OPHTHALMOLOGY EXAM  02/20/2019  . HEMOGLOBIN A1C  10/13/2019  . INFLUENZA VACCINE  01/15/2020  . TETANUS/TDAP  04/12/2020 (Originally 07/25/2018)  . MAMMOGRAM  12/26/2020  . FOOT EXAM  03/06/2021  . COLONOSCOPY  05/01/2024  . DEXA SCAN  Completed  . Hepatitis C Screening  Completed  . PNA vac Low Risk Adult  Completed     Discussed health benefits of physical activity, and encouraged her to engage in regular exercise appropriate for her age and condition.    Problem List Items Addressed This Visit      Cardiovascular and Mediastinum   Hypertension    Well controlled Continue current medications Recheck metabolic panel F/u in 6 months       Relevant Orders   Comprehensive metabolic panel     Endocrine   Diabetes ([place])    Previously well controlled Diet controlled with no medications Recheck A1c ROI sent for last eye exam Foot exam completed today Up-to-date on vaccinations Advised on diet and exercise On statin and ARB Follow-up in 6 months      Relevant Orders   Hemoglobin A1c   Microalbuminuria due to type 2 diabetes mellitus ([place])    Discussed importance of good diabetes control Continue losartan      Hyperlipidemia associated with type 2 diabetes mellitus ([place])    Previously well controlled Goal LDL less than 70 in the setting of diabetes Continue statin at current dose Recheck FLP and CMP      Relevant Orders   Comprehensive metabolic panel   Lipid panel     Nervous and Auditory   Neuropathic postherpetic trigeminal neuralgia (Location of Primary Source of Pain) (Right) (V1)    Followed by neurology No longer on Tegretol Continue gabapentin and Lyrica per neurology        Other   Morbid obesity ([place])    Discussed  importance of healthy weight management Discussed diet and exercise       Avitaminosis D    Recheck level Continue OTC supplementation      Relevant Orders   VITAMIN D 25 Hydroxy (Vit-D Deficiency, Fractures)    Other Visit Diagnoses    Encounter for annual wellness visit (AWV) in Medicare patient    -  Primary   Long-term use of high-risk medication       Relevant Orders   CBC w/Diff/Platelet   TSH   VITAMIN D 25 Hydroxy (Vit-D Deficiency, Fractures)   Breast cancer screening by mammogram       Relevant Orders   MM 3D SCREEN BREAST BILATERAL       Return in about 6 months (around 09/03/2020) for chronic disease f/u.     I, Lavon Paganini, MD, have reviewed all documentation for this visit. The documentation on 03/06/20 for the exam, diagnosis, procedures, and orders are all accurate and complete.   Ziyan Schoon, Dionne Bucy, MD, MPH [facility]

## 2020-03-06 NOTE — Addendum Note (Signed)
Addended by: Ashley Royalty E on: 03/06/2020 04:46 PM   Modules accepted: Orders

## 2020-03-06 NOTE — Assessment & Plan Note (Signed)
Well controlled Continue current medications Recheck metabolic panel F/u in 6 months  

## 2020-03-06 NOTE — Assessment & Plan Note (Signed)
Followed by neurology No longer on Tegretol Continue gabapentin and Lyrica per neurology

## 2020-03-09 DIAGNOSIS — G43719 Chronic migraine without aura, intractable, without status migrainosus: Secondary | ICD-10-CM | POA: Diagnosis not present

## 2020-03-09 DIAGNOSIS — B0229 Other postherpetic nervous system involvement: Secondary | ICD-10-CM | POA: Diagnosis not present

## 2020-03-13 DIAGNOSIS — E1169 Type 2 diabetes mellitus with other specified complication: Secondary | ICD-10-CM | POA: Diagnosis not present

## 2020-03-13 DIAGNOSIS — I10 Essential (primary) hypertension: Secondary | ICD-10-CM | POA: Diagnosis not present

## 2020-03-13 DIAGNOSIS — E785 Hyperlipidemia, unspecified: Secondary | ICD-10-CM | POA: Diagnosis not present

## 2020-03-13 DIAGNOSIS — E559 Vitamin D deficiency, unspecified: Secondary | ICD-10-CM | POA: Diagnosis not present

## 2020-03-13 DIAGNOSIS — Z79899 Other long term (current) drug therapy: Secondary | ICD-10-CM | POA: Diagnosis not present

## 2020-03-14 LAB — COMPREHENSIVE METABOLIC PANEL
ALT: 10 IU/L (ref 0–32)
AST: 12 IU/L (ref 0–40)
Albumin/Globulin Ratio: 1.3 (ref 1.2–2.2)
Albumin: 3.8 g/dL (ref 3.7–4.7)
Alkaline Phosphatase: 95 IU/L (ref 44–121)
BUN/Creatinine Ratio: 11 — ABNORMAL LOW (ref 12–28)
BUN: 11 mg/dL (ref 8–27)
Bilirubin Total: 0.6 mg/dL (ref 0.0–1.2)
CO2: 23 mmol/L (ref 20–29)
Calcium: 9.6 mg/dL (ref 8.7–10.3)
Chloride: 102 mmol/L (ref 96–106)
Creatinine, Ser: 0.98 mg/dL (ref 0.57–1.00)
GFR calc Af Amer: 67 mL/min/{1.73_m2} (ref >59)
GFR calc non Af Amer: 58 mL/min/{1.73_m2} — ABNORMAL LOW (ref >59)
Globulin, Total: 2.9 g/dL (ref 1.5–4.5)
Glucose: 113 mg/dL — ABNORMAL HIGH (ref 65–99)
Potassium: 4 mmol/L (ref 3.5–5.2)
Sodium: 140 mmol/L (ref 134–144)
Total Protein: 6.7 g/dL (ref 6.0–8.5)

## 2020-03-14 LAB — HEMOGLOBIN A1C
Est. average glucose Bld gHb Est-mCnc: 143 mg/dL
Hgb A1c MFr Bld: 6.6 % — ABNORMAL HIGH (ref 4.8–5.6)

## 2020-03-14 LAB — LIPID PANEL
Chol/HDL Ratio: 3.4 ratio (ref 0.0–4.4)
Cholesterol, Total: 137 mg/dL (ref 100–199)
HDL: 40 mg/dL (ref >39)
LDL Chol Calc (NIH): 77 mg/dL (ref 0–99)
Triglycerides: 109 mg/dL (ref 0–149)
VLDL Cholesterol Cal: 20 mg/dL (ref 5–40)

## 2020-03-14 LAB — CBC WITH DIFFERENTIAL/PLATELET
Basophils Absolute: 0.1 10*3/uL (ref 0.0–0.2)
Basos: 1 %
EOS (ABSOLUTE): 0.2 10*3/uL (ref 0.0–0.4)
Eos: 2 %
Hematocrit: 42.3 % (ref 34.0–46.6)
Hemoglobin: 13.7 g/dL (ref 11.1–15.9)
Immature Grans (Abs): 0 10*3/uL (ref 0.0–0.1)
Immature Granulocytes: 0 %
Lymphocytes Absolute: 2.6 10*3/uL (ref 0.7–3.1)
Lymphs: 27 %
MCH: 26.8 pg (ref 26.6–33.0)
MCHC: 32.4 g/dL (ref 31.5–35.7)
MCV: 83 fL (ref 79–97)
Monocytes Absolute: 0.7 10*3/uL (ref 0.1–0.9)
Monocytes: 7 %
Neutrophils Absolute: 6.2 10*3/uL (ref 1.4–7.0)
Neutrophils: 63 %
Platelets: 254 10*3/uL (ref 150–450)
RBC: 5.12 x10E6/uL (ref 3.77–5.28)
RDW: 13.9 % (ref 11.7–15.4)
WBC: 9.7 10*3/uL (ref 3.4–10.8)

## 2020-03-14 LAB — VITAMIN D 25 HYDROXY (VIT D DEFICIENCY, FRACTURES): Vit D, 25-Hydroxy: 56.7 ng/mL (ref 30.0–100.0)

## 2020-03-14 LAB — TSH: TSH: 1.91 u[IU]/mL (ref 0.450–4.500)

## 2020-03-15 ENCOUNTER — Telehealth: Payer: Self-pay

## 2020-03-15 NOTE — Telephone Encounter (Signed)
-----   Message from Virginia Crews, MD sent at 03/15/2020  9:27 AM EDT ----- Normal/stable labs

## 2020-03-15 NOTE — Telephone Encounter (Signed)
Pt advised.   Thanks,   -Sherita Decoste  

## 2020-03-30 DIAGNOSIS — E119 Type 2 diabetes mellitus without complications: Secondary | ICD-10-CM | POA: Diagnosis not present

## 2020-03-30 LAB — HM DIABETES EYE EXAM

## 2020-04-11 ENCOUNTER — Telehealth: Payer: Self-pay | Admitting: Family Medicine

## 2020-04-11 ENCOUNTER — Encounter: Payer: Self-pay | Admitting: Family Medicine

## 2020-04-11 NOTE — Telephone Encounter (Signed)
Copied from [place] [id number]. Topic: Medicare AWV >> Apr 11, 2020  2:32 PM Cher Nakai R wrote: Reason for CRM:  Left message for patient to call back and schedule Medicare Annual Wellness Visit (AWV) either virtually or in office.  Last AWV 04/13/2019  Please schedule at anytime with [facility] Health Advisor.  If any questions, please contact me at [phone]

## 2020-04-18 ENCOUNTER — Ambulatory Visit: Payer: Medicare Other | Admitting: Family Medicine

## 2020-04-23 ENCOUNTER — Other Ambulatory Visit: Payer: Self-pay

## 2020-04-23 ENCOUNTER — Ambulatory Visit
Admission: RE | Admit: 2020-04-23 | Discharge: 2020-04-23 | Disposition: A | Discharge status: Home / Self Care | Payer: Medicare Other | Source: Ambulatory Visit | Attending: Family Medicine | Admitting: Family Medicine

## 2020-04-23 DIAGNOSIS — Z1231 Encounter for screening mammogram for malignant neoplasm of breast: Secondary | ICD-10-CM | POA: Insufficient documentation

## 2020-04-25 ENCOUNTER — Other Ambulatory Visit: Payer: Self-pay | Admitting: Family Medicine

## 2020-04-25 DIAGNOSIS — R921 Mammographic calcification found on diagnostic imaging of breast: Secondary | ICD-10-CM

## 2020-04-25 DIAGNOSIS — R928 Other abnormal and inconclusive findings on diagnostic imaging of breast: Secondary | ICD-10-CM

## 2020-05-04 ENCOUNTER — Ambulatory Visit
Admission: RE | Admit: 2020-05-04 | Discharge: 2020-05-04 | Disposition: A | Discharge status: Home / Self Care | Payer: Medicare Other | Source: Ambulatory Visit | Attending: Family Medicine | Admitting: Family Medicine

## 2020-05-04 ENCOUNTER — Other Ambulatory Visit: Payer: Self-pay

## 2020-05-04 DIAGNOSIS — R921 Mammographic calcification found on diagnostic imaging of breast: Secondary | ICD-10-CM | POA: Diagnosis not present

## 2020-05-04 DIAGNOSIS — R928 Other abnormal and inconclusive findings on diagnostic imaging of breast: Secondary | ICD-10-CM

## 2020-06-05 DIAGNOSIS — G43719 Chronic migraine without aura, intractable, without status migrainosus: Secondary | ICD-10-CM | POA: Diagnosis not present

## 2020-06-05 DIAGNOSIS — R0602 Shortness of breath: Secondary | ICD-10-CM | POA: Diagnosis not present

## 2020-06-05 DIAGNOSIS — B0229 Other postherpetic nervous system involvement: Secondary | ICD-10-CM | POA: Diagnosis not present

## 2020-07-02 NOTE — Progress Notes (Signed)
Subjective:   [name] is a 70 y.o. female who presents for Medicare Annual (Subsequent) preventive examination.  I connected with Ayah Cozzolino today by telephone and verified that I am speaking with the correct person using two identifiers. Location patient: home Location provider: work Persons participating in the virtual visit: patient, provider.   I discussed the limitations, risks, security and privacy concerns of performing an evaluation and management service by telephone and the availability of in person appointments. I also discussed with the patient that there may be a patient responsible charge related to this service. The patient expressed understanding and verbally consented to this telephonic visit.    Interactive audio and video telecommunications were attempted between this provider and patient, however failed, due to patient having technical difficulties OR patient did not have access to video capability.  We continued and completed visit with audio only.   Review of Systems    N/A  Cardiac Risk Factors include: advanced age (>17men, >73 women);diabetes mellitus;dyslipidemia;hypertension;obesity (BMI >30kg/m2)     Objective:    There were no vitals filed for this visit. There is no height or weight on file to calculate BMI.  Advanced Directives 07/03/2020 05/02/2019 04/13/2019 07/22/2018 04/09/2018 10/28/2017 04/27/2017  Does Patient Have a Medical Advance Directive? Yes Yes Yes No Yes Yes Yes  Type of Paramedic of [place];Living will [place];Living will [place];Living will - [place];Living will Living will Living will  Does patient want to make changes to medical advance directive? - No - Patient declined - - - - -  Copy of [place] in Chart? No - copy requested No - copy requested No - copy requested - No - copy requested - -  Would patient like  information on creating a medical advance directive? - - - - - - -    Current Medications (verified) Outpatient Encounter Medications as of 07/03/2020  Medication Sig  . aspirin EC 81 MG tablet Take 81 mg by mouth daily.  Marland Kitchen atorvastatin (LIPITOR) 80 MG tablet Take 1 tablet (80 mg total) by mouth daily.  Marland Kitchen gabapentin (NEURONTIN) 600 MG tablet Take 1.5 tablets (900 mg total) by mouth every 6 (six) hours.  . hydrochlorothiazide (HYDRODIURIL) 12.5 MG tablet Take 1 tablet (12.5 mg total) by mouth daily.  . hydrocortisone (ANUSOL-HC) 2.5 % rectal cream Place 1 application rectally 2 (two) times daily.  Marland Kitchen losartan (COZAAR) 100 MG tablet Take 1 tablet (100 mg total) by mouth daily.  Marland Kitchen omeprazole (PRILOSEC) 20 MG capsule Take 1 capsule (20 mg total) by mouth daily.  . pregabalin (LYRICA) 300 MG capsule Take 1 capsule (300 mg total) by mouth 2 (two) times daily.  Marland Kitchen VITAMIN D PO Take by mouth daily.  . carboxymethylcellulose (REFRESH PLUS) 0.5 % SOLN Apply 2 drops to eye 2 (two) times daily as needed.  . fluticasone furoate-vilanterol (BREO ELLIPTA) 100-25 MCG/INH AEPB Inhale into the lungs. (Patient not taking: Reported on 07/03/2020)   No facility-administered encounter medications on file as of 07/03/2020.    Allergies (verified) Lisinopril, Lovastatin, and Shellfish allergy   History: Past Medical History:  Diagnosis Date  . Anxiety   . Arthritis   . Asthma   . Bronchitis 09/04/2016   urgent care  . Diabetes mellitus without complication ([place])   . Elevated C-reactive protein (CRP) 10/06/2016  . GERD (gastroesophageal reflux disease)   . History of shingles   . Hyperlipidemia   .  Hypertension   . Neuralgia    Right side of head and face, s/p shingles  . Shingles    chronic on Rt side of head and face  . Wears dentures    partial upper   Past Surgical History:  Procedure Laterality Date  . ABDOMINAL HYSTERECTOMY    . BREAST BIOPSY Right 2016   benign- tophat clip  . BREAST BIOPSY  Left 04/08/2016   -benign-fibroadenomatous changes- tophat clip  . BREAST LUMPECTOMY Right 03/2016  . BREAST LUMPECTOMY WITH NEEDLE LOCALIZATION Right 05/02/2016   Procedure: BREAST LUMPECTOMY WITH NEEDLE LOCALIZATION;  Surgeon: Clayburn Pert, MD;  Location: [facility];  Service: General;  Laterality: Right;  . CHOLECYSTECTOMY    . COLONOSCOPY WITH PROPOFOL N/A 05/02/2019   Procedure: COLONOSCOPY WITH BIOPSY;  Surgeon: Lucilla Lame, MD;  Location: [place];  Service: Endoscopy;  Laterality: N/A;  diabetic - diet controlled  . KNEE ARTHROPLASTY Left 12/01/2016   Procedure: COMPUTER ASSISTED TOTAL KNEE ARTHROPLASTY;  Surgeon: Dereck Leep, MD;  Location: [facility];  Service: Orthopedics;  Laterality: Left;  . POLYPECTOMY N/A 05/02/2019   Procedure: POLYPECTOMY;  Surgeon: Lucilla Lame, MD;  Location: [place];  Service: Endoscopy;  Laterality: N/A;  . TOTAL KNEE ARTHROPLASTY Right 2014  . TOTAL KNEE ARTHROPLASTY Right 02/16/2013   Family History  Problem Relation Age of Onset  . Anuerysm Mother        Brain  . Dementia Mother   . Diabetes Mother   . Heart failure Father   . CAD Father   . Dementia Father   . Diabetes Father   . Lupus Sister   . Throat cancer Maternal Grandfather   . Diabetes Brother   . Breast cancer Neg Hx    Social History   Socioeconomic History  . Marital status: Widowed    Spouse name: Not on file  . Number of children: 2  . Years of education: Not on file  . Highest education level: High school graduate  Occupational History  . Occupation: retired  Tobacco Use  . Smoking status: Never Smoker  . Smokeless tobacco: Never Used  Vaping Use  . Vaping Use: Never used  Substance and Sexual Activity  . Alcohol use: No    Alcohol/week: 0.0 standard drinks  . Drug use: No  . Sexual activity: Not on file  Other Topics Concern  . Not on file  Social History Narrative  . Not on file   Social Determinants of Health   Financial  Resource Strain: Low Risk   . Difficulty of Paying Living Expenses: Not hard at all  Food Insecurity: No Food Insecurity  . Worried About Charity fundraiser in the Last Year: Never true  . Ran Out of Food in the Last Year: Never true  Transportation Needs: No Transportation Needs  . Lack of Transportation (Medical): No  . Lack of Transportation (Non-Medical): No  Physical Activity: Inactive  . Days of Exercise per Week: 0 days  . Minutes of Exercise per Session: 0 min  Stress: No Stress Concern Present  . Feeling of Stress : Not at all  Social Connections: Moderately Isolated  . Frequency of Communication with Friends and Family: More than three times a week  . Frequency of Social Gatherings with Friends and Family: More than three times a week  . Attends Religious Services: More than 4 times per year  . Active Member of Clubs or Organizations: No  . Attends Archivist Meetings:  Never  . Marital Status: Widowed    Tobacco Counseling Counseling given: Not Answered   Clinical Intake:  Pre-visit preparation completed: Yes  Pain : No/denies pain     Nutritional Risks: None Diabetes: Yes  How often do you need to have someone help you when you read instructions, pamphlets, or other written materials from your doctor or pharmacy?: 1 - Never  Diabetic? Yes  Nutrition Risk Assessment:  Has the patient had any N/V/D within the last 2 months?  No  Does the patient have any non-healing wounds?  No  Has the patient had any unintentional weight loss or weight gain?  No   Diabetes:  Is the patient diabetic?  Yes  If diabetic, was a CBG obtained today?  No  Did the patient bring in their glucometer from home?  No  How often do you monitor your CBG's? Does not check BS.   Financial Strains and Diabetes Management:  Are you having any financial strains with the device, your supplies or your medication? No .  Does the patient want to be seen by Chronic Care  Management for management of their diabetes?  No  Would the patient like to be referred to a Nutritionist or for Diabetic Management?  No   Diabetic Exams:  Diabetic Eye Exam: Completed 03/30/20 Diabetic Foot Exam: Completed 03/06/20   Interpreter Needed?: No  Information entered by :: [facility], LPN   Activities of Daily Living In your present state of health, do you have any difficulty performing the following activities: 07/03/2020 03/06/2020  Hearing? N N  Vision? N N  Difficulty concentrating or making decisions? N N  Walking or climbing stairs? N N  Dressing or bathing? N N  Doing errands, shopping? N N  Preparing Food and eating ? N -  Using the Toilet? N -  In the past six months, have you accidently leaked urine? N -  Do you have problems with loss of bowel control? N -  Managing your Medications? N -  Managing your Finances? N -  Housekeeping or managing your Housekeeping? N -  Some recent data might be hidden    Patient Care Team: Bacigalupo, Dionne Bucy, MD as PCP - General (Family Medicine) Milinda Pointer, MD as Referring Physician (Pain Medicine) Marry Guan Laurice Record, MD as Consulting Physician (Orthopedic Surgery) Eulogio Bear, MD as Consulting Physician (Ophthalmology) Vladimir Crofts, MD as Consulting Physician (Neurology) Sunday Corn Criss Alvine Hubbard Hartshorn (Neurology)  Indicate any recent Medical Services you may have received from other than [facility] providers in the past year (date may be approximate).     Assessment:   This is a routine wellness examination for [place].  Hearing/Vision screen No exam data present  Dietary issues and exercise activities discussed: Current Exercise Habits: The patient does not participate in regular exercise at present, Exercise limited by: None identified  Goals    . DIET - INCREASE WATER INTAKE     Recommend to drink at least 6-8 8oz glasses of water per day.    . Have 3 meals a day     Recommend to eat 3 small meals a day  with 2 healthy snacks in between.      Depression Screen PHQ 2/9 Scores 07/03/2020 03/06/2020 04/13/2019 04/09/2018 10/28/2017 05/05/2017 02/18/2017  PHQ - 2 Score 0 0 0 3 0 0 2  PHQ- 9 Score - 0 - 6 - - 5    Fall Risk Fall Risk  07/03/2020 03/06/2020 04/13/2019 01/10/2019 04/09/2018  Falls in the past year? 0 0 1 0 No  Number falls in past yr: 0 0 0 0 -  Injury with Fall? 0 0 0 0 -  Risk for fall due to : - No Fall Risks - - -  Risk for fall due to: Comment - - - - -  Follow up - Follow up appointment Falls prevention discussed - -    FALL RISK PREVENTION PERTAINING TO THE HOME:  Any stairs in or around the home? Yes  If so, are there any without handrails? No  Home free of loose throw rugs in walkways, pet beds, electrical cords, etc? Yes  Adequate lighting in your home to reduce risk of falls? Yes   ASSISTIVE DEVICES UTILIZED TO PREVENT FALLS:  Life alert? No  Use of a cane, walker or w/c? No  Grab bars in the bathroom? No  Shower chair or bench in shower? No  Elevated toilet seat or a handicapped toilet? Yes    Cognitive Function: Normal cognitive status assessed by observation by this Nurse Health Advisor. No abnormalities found.          Immunizations Immunization History  Administered Date(s) Administered  . Fluad Quad(high Dose 65+) 04/05/2019, 03/06/2020  . Influenza Split 04/09/2010, 04/02/2012, 05/26/2012  . Influenza, High Dose Seasonal PF 04/04/2014, 02/29/2016, 02/24/2017, 04/09/2018  . Moderna Sars-Covid-2 Vaccination 08/29/2019, 09/29/2019  . Pneumococcal Conjugate-13 02/24/2017  . Pneumococcal Polysaccharide-23 04/09/2018  . Tdap 07/25/2008  . Zoster Recombinat (Shingrix) 06/02/2019, 08/08/2019    TDAP status: Due, Education has been provided regarding the importance of this vaccine. Advised may receive this vaccine at local pharmacy or Health Dept. Aware to provide a copy of the vaccination record if obtained from local pharmacy or Health Dept.  Verbalized acceptance and understanding.  Flu Vaccine status: Up to date  Pneumococcal vaccine status: Up to date  Covid-19 vaccine status: Completed vaccines  Qualifies for Shingles Vaccine? Yes   Zostavax completed No   Shingrix Completed?: Yes  Screening Tests Health Maintenance  Topic Date Due  . COVID-19 Vaccine (3 - Moderna risk 4-dose series) 10/27/2019  . TETANUS/TDAP  07/03/2021 (Originally 07/25/2018)  . HEMOGLOBIN A1C  09/10/2020  . FOOT EXAM  03/06/2021  . OPHTHALMOLOGY EXAM  03/30/2021  . MAMMOGRAM  05/04/2022  . COLONOSCOPY (Pts 45-39yrs Insurance coverage will need to be confirmed)  05/01/2024  . INFLUENZA VACCINE  Completed  . DEXA SCAN  Completed  . Hepatitis C Screening  Completed  . PNA vac Low Risk Adult  Completed    Health Maintenance  Health Maintenance Due  Topic Date Due  . COVID-19 Vaccine (3 - Moderna risk 4-dose series) 10/27/2019    Colorectal cancer screening: Type of screening: Colonoscopy. Completed 05/02/19. Repeat every 5 years  Mammogram status: Completed 05/04/20. Repeat every year  Bone Density status: Completed 07/27/08. Results reflect: Previous DEXA scan was normal. No repeat needed unless advised by a physician.  Lung Cancer Screening: (Low Dose CT Chest recommended if Age 97-80 years, 30 pack-year currently smoking OR have quit w/in 15years.) does not qualify.   Additional Screening:  Hepatitis C Screening: Up to date  Vision Screening: Recommended annual ophthalmology exams for early detection of glaucoma and other disorders of the eye. Is the patient up to date with their annual eye exam?  Yes  Who is the provider or what is the name of the office in which the patient attends annual eye exams? Dr Edison Pace @ [place] If pt is not established  with a provider, would they like to be referred to a provider to establish care? No .   Dental Screening: Recommended annual dental exams for proper oral hygiene  Community Resource Referral /  Chronic Care Management: CRR required this visit?  No   CCM required this visit?  No      Plan:     I have personally reviewed and noted the following in the patient's chart:   . Medical and social history . Use of alcohol, tobacco or illicit drugs  . Current medications and supplements . Functional ability and status . Nutritional status . Physical activity . Advanced directives . List of other physicians . Hospitalizations, surgeries, and ER visits in previous 12 months . Vitals . Screenings to include cognitive, depression, and falls . Referrals and appointments  In addition, I have reviewed and discussed with patient certain preventive protocols, quality metrics, and best practice recommendations. A written personalized care plan for preventive services as well as general preventive health recommendations were provided to patient.     Rogan Ecklund [place], [place]   [id number]   Nurse Notes: Pt plans to receive the Covid booster in the near future.

## 2020-07-03 ENCOUNTER — Ambulatory Visit (INDEPENDENT_AMBULATORY_CARE_PROVIDER_SITE_OTHER): Payer: Medicare Other

## 2020-07-03 ENCOUNTER — Other Ambulatory Visit: Payer: Self-pay

## 2020-07-03 DIAGNOSIS — Z0000 Encounter for general adult medical examination without abnormal findings: Secondary | ICD-10-CM | POA: Diagnosis not present

## 2020-07-03 NOTE — Patient Instructions (Signed)
[name] , Thank you for taking time to come for your Medicare Wellness Visit. I appreciate your ongoing commitment to your health goals. Please review the following plan we discussed and let me know if I can assist you in the future.   Screening recommendations/referrals: Colonoscopy: Up to date, due 04/2024 Mammogram: Up to date, due 04/2021 Bone Density: Up to date, previous DEXA scan was normal. No repeat needed unless advised by a physician. Recommended yearly ophthalmology/optometry visit for glaucoma screening and checkup Recommended yearly dental visit for hygiene and checkup  Vaccinations: Influenza vaccine: Done 03/06/20 Pneumococcal vaccine: Completed series Tdap vaccine: Currently due, declined receiving. Shingles vaccine: Completed series    Advanced directives: Please bring a copy of your POA (Power of Attorney) and/or Living Will to your next appointment.   Conditions/risks identified: Recommend to increasing water intake to 6-8 8 oz glasses a day.   Next appointment: 07/05/20 @ 8:00 AM with Dr Brita Romp    Preventive Care 65 Years and Older, Female Preventive care refers to lifestyle choices and visits with your health care provider that can promote health and wellness. What does preventive care include?  A yearly physical exam. This is also called an annual well check.  Dental exams once or twice a year.  Routine eye exams. Ask your health care provider how often you should have your eyes checked.  Personal lifestyle choices, including:  Daily care of your teeth and gums.  Regular physical activity.  Eating a healthy diet.  Avoiding tobacco and drug use.  Limiting alcohol use.  Practicing safe sex.  Taking low-dose aspirin every day.  Taking vitamin and mineral supplements as recommended by your health care provider. What happens during an annual well check? The services and screenings done by your health care provider during your annual well check  will depend on your age, overall health, lifestyle risk factors, and family history of disease. Counseling  Your health care provider may ask you questions about your:  Alcohol use.  Tobacco use.  Drug use.  Emotional well-being.  Home and relationship well-being.  Sexual activity.  Eating habits.  History of falls.  Memory and ability to understand (cognition).  Work and work Statistician.  Reproductive health. Screening  You may have the following tests or measurements:  Height, weight, and BMI.  Blood pressure.  Lipid and cholesterol levels. These may be checked every 5 years, or more frequently if you are over 66 years old.  Skin check.  Lung cancer screening. You may have this screening every year starting at age 36 if you have a 30-pack-year history of smoking and currently smoke or have quit within the past 15 years.  Fecal occult blood test (FOBT) of the stool. You may have this test every year starting at age 59.  Flexible sigmoidoscopy or colonoscopy. You may have a sigmoidoscopy every 5 years or a colonoscopy every 10 years starting at age 60.  Hepatitis C blood test.  Hepatitis B blood test.  Sexually transmitted disease (STD) testing.  Diabetes screening. This is done by checking your blood sugar (glucose) after you have not eaten for a while (fasting). You may have this done every 1-3 years.  Bone density scan. This is done to screen for osteoporosis. You may have this done starting at age 68.  Mammogram. This may be done every 1-2 years. Talk to your health care provider about how often you should have regular mammograms. Talk with your health care provider about your test results,  treatment options, and if necessary, the need for more tests. Vaccines  Your health care provider may recommend certain vaccines, such as:  Influenza vaccine. This is recommended every year.  Tetanus, diphtheria, and acellular pertussis (Tdap, Td) vaccine. You may  need a Td booster every 10 years.  Zoster vaccine. You may need this after age 21.  Pneumococcal 13-valent conjugate (PCV13) vaccine. One dose is recommended after age 49.  Pneumococcal polysaccharide (PPSV23) vaccine. One dose is recommended after age 30. Talk to your health care provider about which screenings and vaccines you need and how often you need them. This information is not intended to replace advice given to you by your health care provider. Make sure you discuss any questions you have with your health care provider. Document Released: 06/29/2015 Document Revised: 02/20/2016 Document Reviewed: 04/03/2015 Elsevier Interactive Patient Education  2017 [place] Prevention in the Home Falls can cause injuries. They can happen to people of all ages. There are many things you can do to make your home safe and to help prevent falls. What can I do on the outside of my home?  Regularly fix the edges of walkways and driveways and fix any cracks.  Remove anything that might make you trip as you walk through a door, such as a raised step or threshold.  Trim any bushes or trees on the path to your home.  Use bright outdoor lighting.  Clear any walking paths of anything that might make someone trip, such as rocks or tools.  Regularly check to see if handrails are loose or broken. Make sure that both sides of any steps have handrails.  Any raised decks and porches should have guardrails on the edges.  Have any leaves, snow, or ice cleared regularly.  Use sand or salt on walking paths during winter.  Clean up any spills in your garage right away. This includes oil or grease spills. What can I do in the bathroom?  Use night lights.  Install grab bars by the toilet and in the tub and shower. Do not use towel bars as grab bars.  Use non-skid mats or decals in the tub or shower.  If you need to sit down in the shower, use a plastic, non-slip stool.  Keep the floor  dry. Clean up any water that spills on the floor as soon as it happens.  Remove soap buildup in the tub or shower regularly.  Attach bath mats securely with double-sided non-slip rug tape.  Do not have throw rugs and other things on the floor that can make you trip. What can I do in the bedroom?  Use night lights.  Make sure that you have a light by your bed that is easy to reach.  Do not use any sheets or blankets that are too big for your bed. They should not hang down onto the floor.  Have a firm chair that has side arms. You can use this for support while you get dressed.  Do not have throw rugs and other things on the floor that can make you trip. What can I do in the kitchen?  Clean up any spills right away.  Avoid walking on wet floors.  Keep items that you use a lot in easy-to-reach places.  If you need to reach something above you, use a strong step stool that has a grab bar.  Keep electrical cords out of the way.  Do not use floor polish or wax that makes floors  slippery. If you must use wax, use non-skid floor wax.  Do not have throw rugs and other things on the floor that can make you trip. What can I do with my stairs?  Do not leave any items on the stairs.  Make sure that there are handrails on both sides of the stairs and use them. Fix handrails that are broken or loose. Make sure that handrails are as long as the stairways.  Check any carpeting to make sure that it is firmly attached to the stairs. Fix any carpet that is loose or worn.  Avoid having throw rugs at the top or bottom of the stairs. If you do have throw rugs, attach them to the floor with carpet tape.  Make sure that you have a light switch at the top of the stairs and the bottom of the stairs. If you do not have them, ask someone to add them for you. What else can I do to help prevent falls?  Wear shoes that:  Do not have high heels.  Have rubber bottoms.  Are comfortable and fit you  well.  Are closed at the toe. Do not wear sandals.  If you use a stepladder:  Make sure that it is fully opened. Do not climb a closed stepladder.  Make sure that both sides of the stepladder are locked into place.  Ask someone to hold it for you, if possible.  Clearly mark and make sure that you can see:  Any grab bars or handrails.  First and last steps.  Where the edge of each step is.  Use tools that help you move around (mobility aids) if they are needed. These include:  Canes.  Walkers.  Scooters.  Crutches.  Turn on the lights when you go into a dark area. Replace any light bulbs as soon as they burn out.  Set up your furniture so you have a clear path. Avoid moving your furniture around.  If any of your floors are uneven, fix them.  If there are any pets around you, be aware of where they are.  Review your medicines with your doctor. Some medicines can make you feel dizzy. This can increase your chance of falling. Ask your doctor what other things that you can do to help prevent falls. This information is not intended to replace advice given to you by your health care provider. Make sure you discuss any questions you have with your health care provider. Document Released: 03/29/2009 Document Revised: 11/08/2015 Document Reviewed: 07/07/2014 Elsevier Interactive Patient Education  2017 [employer].

## 2020-07-04 ENCOUNTER — Other Ambulatory Visit: Payer: Self-pay

## 2020-07-04 DIAGNOSIS — B0229 Other postherpetic nervous system involvement: Secondary | ICD-10-CM

## 2020-07-04 DIAGNOSIS — M792 Neuralgia and neuritis, unspecified: Secondary | ICD-10-CM

## 2020-07-04 DIAGNOSIS — I10 Essential (primary) hypertension: Secondary | ICD-10-CM

## 2020-07-04 NOTE — Telephone Encounter (Signed)
Pt wanting to know if she can just have her medications refilled since her she cannot come in for her visit on Thurs. She also doesn't have the capability of doing a virtual visit.  Please call pt back to let her know.  Thanks, [employer]

## 2020-07-05 ENCOUNTER — Ambulatory Visit: Payer: Medicare Other | Admitting: Family Medicine

## 2020-07-05 MED ORDER — HYDROCHLOROTHIAZIDE 12.5 MG PO TABS: 12.5000 mg | ORAL_TABLET | Freq: Every day | ORAL | 3 refills | Status: DC

## 2020-07-05 MED ORDER — LOSARTAN POTASSIUM 100 MG PO TABS: 100.0000 mg | ORAL_TABLET | Freq: Every day | ORAL | 3 refills | Status: DC

## 2020-07-05 MED ORDER — OMEPRAZOLE 20 MG PO CPDR: 20.0000 mg | DELAYED_RELEASE_CAPSULE | Freq: Every day | ORAL | 3 refills | Status: DC

## 2020-07-05 MED ORDER — ATORVASTATIN CALCIUM 80 MG PO TABS: 80.0000 mg | ORAL_TABLET | Freq: Every day | ORAL | 3 refills | Status: DC

## 2020-07-05 NOTE — Telephone Encounter (Signed)
I have arranged appointment with you for 07/19/20 at 10:20 and refilled medications, I cannot fill the Lyrica can you please refill, I was going  To refill the Gabapentin but it shows as expired and wasn't sure if you wanted patient to be taking both medications as they can contradict . Thanks [employer]

## 2020-07-05 NOTE — Telephone Encounter (Signed)
Ok to send refills of chronic meds and try to reschedule for ~2 wks from now for in office

## 2020-07-06 ENCOUNTER — Other Ambulatory Visit: Payer: Self-pay

## 2020-07-06 NOTE — Telephone Encounter (Signed)
Her mail order pharmacy does not accept e-Rx for controlled substances.  Is she taking Lyrica or gabapentin? And if Lyrica, then does she want it to go to local pharmacy?

## 2020-07-06 NOTE — Telephone Encounter (Signed)
Dr. B The patient said she takes both Gabapentin and Lyrica.  I explained to the patient about the controlled substances.  She actually gets it from the [place] itself.  She did not want it sent to the local pharmacy because of the cost.  She said she didn't realize it was controlled and said she probably shouldn't be taking it anyway.  She said she would talk to you about it on her next visit and if she is to stay on it she will get a hard copy from you and send it to the [place].

## 2020-07-06 NOTE — Telephone Encounter (Signed)
Ok. Will plan to reassess at upcoming visit. Should not be on Gabapentin and Lyrica together.

## 2020-07-09 ENCOUNTER — Ambulatory Visit
Admission: EM | Admit: 2020-07-09 | Discharge: 2020-07-09 | Disposition: A | Discharge status: Home / Self Care | Payer: Medicare Other | Attending: Family Medicine | Admitting: Family Medicine

## 2020-07-09 ENCOUNTER — Encounter: Payer: Self-pay | Admitting: Emergency Medicine

## 2020-07-09 ENCOUNTER — Other Ambulatory Visit: Payer: Self-pay

## 2020-07-09 DIAGNOSIS — J988 Other specified respiratory disorders: Secondary | ICD-10-CM | POA: Diagnosis not present

## 2020-07-09 DIAGNOSIS — U071 COVID-19: Secondary | ICD-10-CM | POA: Insufficient documentation

## 2020-07-09 DIAGNOSIS — Z7951 Long term (current) use of inhaled steroids: Secondary | ICD-10-CM | POA: Diagnosis not present

## 2020-07-09 DIAGNOSIS — J45909 Unspecified asthma, uncomplicated: Secondary | ICD-10-CM | POA: Insufficient documentation

## 2020-07-09 DIAGNOSIS — B9789 Other viral agents as the cause of diseases classified elsewhere: Secondary | ICD-10-CM | POA: Diagnosis not present

## 2020-07-09 MED ORDER — BENZONATATE 200 MG PO CAPS: 200.0000 mg | ORAL_CAPSULE | Freq: Three times a day (TID) | ORAL | 0 refills | Status: DC | PRN

## 2020-07-09 MED ORDER — ALBUTEROL SULFATE HFA 108 (90 BASE) MCG/ACT IN AERS: 1.0000 | INHALATION_SPRAY | Freq: Four times a day (QID) | RESPIRATORY_TRACT | 0 refills | Status: DC | PRN

## 2020-07-09 NOTE — ED Triage Notes (Signed)
Pt presents today with sorethroat, cough, nasal congestion and body aches x 4 days.

## 2020-07-09 NOTE — ED Provider Notes (Signed)
[facility]    CSN: [id number] Arrival date & time: 07/09/20  0943      History   Chief Complaint Chief Complaint  Patient presents with  . Sore Throat  . Headache  . Cough   HPI  70 year old female presents with the above complaints.  4 day history of symptoms.  Patient reports sore throat, headache, backache, runny nose, cough.  Cough is worse at night.  Denies any sick contacts.  No relieving factors.  Patient also notes that she is short of breath.  Pain 7/10 in severity.  No fever.  No other reported symptoms.  No other complaints.  Past Medical History:  Diagnosis Date  . Anxiety   . Arthritis   . Asthma   . Bronchitis 09/04/2016   urgent care  . Diabetes mellitus without complication ([place])   . Elevated C-reactive protein (CRP) 10/06/2016  . GERD (gastroesophageal reflux disease)   . History of shingles   . Hyperlipidemia   . Hypertension   . Neuralgia    Right side of head and face, s/p shingles  . Shingles    chronic on Rt side of head and face  . Wears dentures    partial upper    Patient Active Problem List   Diagnosis Date Noted  . Hyperlipidemia associated with type 2 diabetes mellitus ([place]) 03/06/2020  . Special screening for malignant neoplasms, colon   . Benign neoplasm of cecum   . Bilateral bunions 10/12/2018  . Change in bowel habits 01/29/2018  . Rectal irritation 01/29/2018  . Postnasal drip 11/04/2017  . Other viral warts 11/04/2017  . Acute pain of both knees 08/07/2017  . Microalbuminuria due to type 2 diabetes mellitus ([place]) 03/26/2017  . Primary osteoarthritis involving multiple joints 12/05/2016  . Status post total left knee replacement 12/01/2016  . Primary osteoarthritis of left knee 09/07/2016  . Hypertension 07/24/2016  . Occipital headache 01/21/2016  . Bilateral occipital neuralgia 01/21/2016  . Chronic pain syndrome 05/07/2015  . Gasserian ganglionitis 03/12/2015  . Neuropathic pain 03/12/2015  . Herpes  zoster ophthalmicus 03/12/2015  . Anxiety 01/11/2015  . Diabetes ([place]) 01/11/2015  . Photopsia 01/11/2015  . Avitaminosis A 01/11/2015  . Depression 01/10/2015  . Morbid obesity ([place]) 04/18/2014  . Neuropathic postherpetic trigeminal neuralgia (Location of Primary Source of Pain) (Right) (V1) 12/21/2013  . Cannot sleep 03/13/2009  . Asthma 02/07/2009  . Calcium deficiency disease 10/16/2008  . Avitaminosis D 07/05/2008  . History of colon polyps 10/17/2005    Past Surgical History:  Procedure Laterality Date  . ABDOMINAL HYSTERECTOMY    . BREAST BIOPSY Right 2016   benign- tophat clip  . BREAST BIOPSY Left 04/08/2016   -benign-fibroadenomatous changes- tophat clip  . BREAST LUMPECTOMY Right 03/2016  . BREAST LUMPECTOMY WITH NEEDLE LOCALIZATION Right 05/02/2016   Procedure: BREAST LUMPECTOMY WITH NEEDLE LOCALIZATION;  Surgeon: Clayburn Pert, MD;  Location: [facility];  Service: General;  Laterality: Right;  . CHOLECYSTECTOMY    . COLONOSCOPY WITH PROPOFOL N/A 05/02/2019   Procedure: COLONOSCOPY WITH BIOPSY;  Surgeon: Lucilla Lame, MD;  Location: [facility];  Service: Endoscopy;  Laterality: N/A;  diabetic - diet controlled  . KNEE ARTHROPLASTY Left 12/01/2016   Procedure: COMPUTER ASSISTED TOTAL KNEE ARTHROPLASTY;  Surgeon: Dereck Leep, MD;  Location: [facility];  Service: Orthopedics;  Laterality: Left;  . POLYPECTOMY N/A 05/02/2019   Procedure: POLYPECTOMY;  Surgeon: Lucilla Lame, MD;  Location: [facility];  Service: Endoscopy;  Laterality: N/A;  . TOTAL KNEE ARTHROPLASTY Right 2014  . TOTAL KNEE ARTHROPLASTY Right 02/16/2013    OB History    Gravida  2   Para  2   Term      Preterm      AB      Living        SAB      IAB      Ectopic      Multiple      Live Births               Home Medications    Prior to Admission medications   Medication Sig Start Date End Date Taking? Authorizing Provider  albuterol (VENTOLIN HFA) 108 (90  Base) MCG/ACT inhaler Inhale 1-2 puffs into the lungs every 6 (six) hours as needed for wheezing or shortness of breath. 07/09/20  Yes Mickie Kozikowski G, DO  benzonatate (TESSALON) 200 MG capsule Take 1 capsule (200 mg total) by mouth 3 (three) times daily as needed for cough. 07/09/20  Yes Coral Spikes, DO  aspirin EC 81 MG tablet Take 81 mg by mouth daily.    [provider]  atorvastatin (LIPITOR) 80 MG tablet Take 1 tablet (80 mg total) by mouth daily. 07/05/20   Virginia Crews, MD  carboxymethylcellulose (REFRESH PLUS) 0.5 % SOLN Apply 2 drops to eye 2 (two) times daily as needed.    [provider]  fluticasone furoate-vilanterol (BREO ELLIPTA) 100-25 MCG/INH AEPB Inhale into the lungs. Patient not taking: Reported on 07/03/2020 10/07/19   [provider]  gabapentin (NEURONTIN) 600 MG tablet Take 1.5 tablets (900 mg total) by mouth every 6 (six) hours. 11/03/19 02/01/20  Virginia Crews, MD  hydrochlorothiazide (HYDRODIURIL) 12.5 MG tablet Take 1 tablet (12.5 mg total) by mouth daily. 07/05/20   Virginia Crews, MD  hydrocortisone (ANUSOL-HC) 2.5 % rectal cream Place 1 application rectally 2 (two) times daily. 04/14/19   Virginia Crews, MD  losartan (COZAAR) 100 MG tablet Take 1 tablet (100 mg total) by mouth daily. 07/05/20   Virginia Crews, MD  omeprazole (PRILOSEC) 20 MG capsule Take 1 capsule (20 mg total) by mouth daily. 07/05/20   Bacigalupo, Dionne Bucy, MD  pregabalin (LYRICA) 300 MG capsule Take 1 capsule (300 mg total) by mouth 2 (two) times daily. 08/08/19 08/07/20  Virginia Crews, MD  VITAMIN D PO Take by mouth daily.    [provider]    Family History Family History  Problem Relation Age of Onset  . Anuerysm Mother        Brain  . Dementia Mother   . Diabetes Mother   . Heart failure Father   . CAD Father   . Dementia Father   . Diabetes Father   . Lupus Sister   . Throat cancer Maternal Grandfather   . Diabetes  Brother   . Breast cancer Neg Hx     Social History Social History   Tobacco Use  . Smoking status: Never Smoker  . Smokeless tobacco: Never Used  Vaping Use  . Vaping Use: Never used  Substance Use Topics  . Alcohol use: No    Alcohol/week: 0.0 standard drinks  . Drug use: No     Allergies   Lisinopril, Lovastatin, and Shellfish allergy   Review of Systems Review of Systems Per HPI  Physical Exam Triage Vital Signs ED Triage Vitals  Enc Vitals Group     BP 07/09/20 0952 Marland Kitchen)  99/52     Pulse Rate 07/09/20 0952 94     Resp 07/09/20 0952 18     Temp 07/09/20 0952 98 F (36.7 C)     Temp Source 07/09/20 0952 Oral     SpO2 07/09/20 0952 99 %     Weight 07/09/20 0953 240 lb (108.9 kg)     Height 07/09/20 0953 5\' 3"  (1.6 m)     Head Circumference --      Peak Flow --      Pain Score 07/09/20 0952 7     Pain Loc --      Pain Edu? --      Excl. in [place]? --    Updated Vital Signs BP (!) 99/52 (BP Location: Left Arm)   Pulse 94   Temp 98 F (36.7 C) (Oral)   Resp 18   Ht 5\' 3"  (1.6 m)   Wt 108.9 kg   SpO2 99%   BMI 42.51 kg/m   Visual Acuity Right Eye Distance:   Left Eye Distance:   Bilateral Distance:    Right Eye Near:   Left Eye Near:    Bilateral Near:     Physical Exam Vitals and nursing note reviewed.  Constitutional:      General: She is not in acute distress.    Appearance: She is well-developed. She is not ill-appearing.  HENT:     Head: Normocephalic and atraumatic.  Eyes:     General:        Right eye: No discharge.     Conjunctiva/sclera: Conjunctivae normal.  Cardiovascular:     Rate and Rhythm: Normal rate and regular rhythm.     Heart sounds: No murmur heard.   Pulmonary:     Effort: Pulmonary effort is normal.     Breath sounds: Normal breath sounds. No wheezing, rhonchi or rales.  Neurological:     Mental Status: She is alert.  Psychiatric:        Mood and Affect: Mood normal.        Behavior: Behavior normal.    UC  Treatments / Results  Labs (all labs ordered are listed, but only abnormal results are displayed) Labs Reviewed  SARS CORONAVIRUS 2 (TAT 6-24 HRS)    EKG   Radiology No results found.  Procedures Procedures (including critical care time)  Medications Ordered in UC Medications - No data to display  Initial Impression / Assessment and Plan / UC Course  I have reviewed the triage vital signs and the nursing notes.  Pertinent labs & imaging results that were available during my care of the patient were reviewed by me and considered in my medical decision making (see chart for details).    70 year old female presents with a viral illness.  Possible COVID-19.  Awaiting test results.  Tessalon Perles for cough.  Albuterol as needed for shortness of breath.  Supportive care.  Final Clinical Impressions(s) / UC Diagnoses   Final diagnoses:  Viral respiratory infection     Discharge Instructions     Rest. Fluids.  Medication as prescribed.  Stay home.  Check my chart for COVID test results.  Take care  Dr. Lacinda Axon     ED Prescriptions    Medication Sig Dispense Auth. Provider   benzonatate (TESSALON) 200 MG capsule Take 1 capsule (200 mg total) by mouth 3 (three) times daily as needed for cough. 30 capsule Elray Dains G, DO   albuterol (VENTOLIN HFA) 108 (90 Base) MCG/ACT inhaler Inhale  1-2 puffs into the lungs every 6 (six) hours as needed for wheezing or shortness of breath. 18 g Coral Spikes, DO     PDMP not reviewed this encounter.   Coral Spikes, [place] 07/09/20 1141

## 2020-07-09 NOTE — Discharge Instructions (Signed)
Rest. Fluids.  Medication as prescribed.  Stay home.  Check my chart for COVID test results.  Take care  Dr. Lacinda Axon

## 2020-07-10 LAB — SARS CORONAVIRUS 2 (TAT 6-24 HRS): SARS Coronavirus 2: POSITIVE — AB

## 2020-07-11 ENCOUNTER — Telehealth: Payer: Self-pay | Admitting: Family

## 2020-07-11 MED ORDER — PROMETHAZINE-DM 6.25-15 MG/5ML PO SYRP: 5.0000 mL | ORAL_SOLUTION | Freq: Four times a day (QID) | ORAL | 0 refills | Status: DC | PRN

## 2020-07-11 NOTE — Telephone Encounter (Signed)
Called to discuss with patient about COVID-19 symptoms and the use of one of the available treatments for those with mild to moderate Covid symptoms and at a high risk of hospitalization.  Pt appears to qualify for outpatient treatment due to co-morbid conditions and/or a member of an at-risk group in accordance with the FDA Emergency Use Authorization.    Symptom onset: 07/05/2020 Vaccinated: Yes Booster? No Immunocompromised? No Qualifiers: Age  Spoke with [name]. Tells me her symptoms are overall improving. We discussed the possibility of IV Remdesivir but she tells me as her symptoms are improving she prefers to proceed with symptom management and I agree. Reports some mild diarrhea, encouraged to stay well hydrated and eat a bland diet (toast, bananas, rice). She does not feel the Tessalon perles are working for her and as such, will Rx Promethazine cough syrup. Sent to her preferred pharmacy. She was appreciative of the call.   [name]

## 2020-07-20 ENCOUNTER — Ambulatory Visit: Payer: Self-pay | Admitting: Family Medicine

## 2020-07-21 DIAGNOSIS — Z23 Encounter for immunization: Secondary | ICD-10-CM | POA: Diagnosis not present

## 2020-07-25 ENCOUNTER — Other Ambulatory Visit: Payer: Self-pay | Admitting: Family Medicine

## 2020-08-03 NOTE — Progress Notes (Signed)
Established patient visit   Patient: [name]   DOB: Feb 17, 1951   70 y.o. Female  MRN: [id number] Visit Date: 08/06/2020  Today's healthcare provider: Lavon Paganini, MD   Chief Complaint  Patient presents with  . Medication Refill    Pt needs refill on Breo Ellipta and Gabapentin.   Subjective    HPI HPI    Medication Refill     Additional comments: Pt needs refill on Breo Ellipta and Gabapentin.       Last edited by Sylvester Harder, [place] on 08/06/2020  9:23 AM. (History)      07/03/2020 AWV with [facility]  05/04/2020 Mammogram-BI-RADS 2 05/02/2019 Colonoscopy-polyp, Diverticulosis, non-internal bleeding  Patient Active Problem List   Diagnosis Date Noted  . Hyperlipidemia associated with type 2 diabetes mellitus ([place]) 03/06/2020  . Special screening for malignant neoplasms, colon   . Benign neoplasm of cecum   . Bilateral bunions 10/12/2018  . Change in bowel habits 01/29/2018  . Rectal irritation 01/29/2018  . Postnasal drip 11/04/2017  . Other viral warts 11/04/2017  . Acute pain of both knees 08/07/2017  . Microalbuminuria due to type 2 diabetes mellitus ([place]) 03/26/2017  . Primary osteoarthritis involving multiple joints 12/05/2016  . Status post total left knee replacement 12/01/2016  . Primary osteoarthritis of left knee 09/07/2016  . Hypertension associated with diabetes ([place]) 07/24/2016  . Occipital headache 01/21/2016  . Bilateral occipital neuralgia 01/21/2016  . Chronic pain syndrome 05/07/2015  . Gasserian ganglionitis 03/12/2015  . Neuropathic pain 03/12/2015  . Herpes zoster ophthalmicus 03/12/2015  . Anxiety 01/11/2015  . Diabetes ([place]) 01/11/2015  . Photopsia 01/11/2015  . Avitaminosis A 01/11/2015  . Depression 01/10/2015  . Postherpetic neuralgia 11/22/2014  . Morbid obesity ([place]) 04/18/2014  . Neuropathic postherpetic trigeminal neuralgia (Location of Primary Source of Pain) (Right) (V1) 12/21/2013  . Cannot sleep  03/13/2009  . Asthma 02/07/2009  . Calcium deficiency disease 10/16/2008  . Avitaminosis D 07/05/2008  . History of colon polyps 10/17/2005   Past Surgical History:  Procedure Laterality Date  . ABDOMINAL HYSTERECTOMY    . BREAST BIOPSY Right 2016   benign- tophat clip  . BREAST BIOPSY Left 04/08/2016   -benign-fibroadenomatous changes- tophat clip  . BREAST LUMPECTOMY Right 03/2016  . BREAST LUMPECTOMY WITH NEEDLE LOCALIZATION Right 05/02/2016   Procedure: BREAST LUMPECTOMY WITH NEEDLE LOCALIZATION;  Surgeon: Clayburn Pert, MD;  Location: [facility];  Service: General;  Laterality: Right;  . CHOLECYSTECTOMY    . COLONOSCOPY WITH PROPOFOL N/A 05/02/2019   Procedure: COLONOSCOPY WITH BIOPSY;  Surgeon: Lucilla Lame, MD;  Location: [facility];  Service: Endoscopy;  Laterality: N/A;  diabetic - diet controlled  . KNEE ARTHROPLASTY Left 12/01/2016   Procedure: COMPUTER ASSISTED TOTAL KNEE ARTHROPLASTY;  Surgeon: Dereck Leep, MD;  Location: [facility];  Service: Orthopedics;  Laterality: Left;  . POLYPECTOMY N/A 05/02/2019   Procedure: POLYPECTOMY;  Surgeon: Lucilla Lame, MD;  Location: [facility];  Service: Endoscopy;  Laterality: N/A;  . TOTAL KNEE ARTHROPLASTY Right 2014  . TOTAL KNEE ARTHROPLASTY Right 02/16/2013   Social History   Socioeconomic History  . Marital status: Widowed    Spouse name: Not on file  . Number of children: 2  . Years of education: Not on file  . Highest education level: High school graduate  Occupational History  . Occupation: retired  Tobacco Use  . Smoking status: Never Smoker  . Smokeless tobacco: Never Used  Vaping Use  .  Vaping Use: Never used  Substance and Sexual Activity  . Alcohol use: No    Alcohol/week: 0.0 standard drinks  . Drug use: No  . Sexual activity: Not on file  Other Topics Concern  . Not on file  Social History Narrative  . Not on file   Social Determinants of Health   Financial Resource Strain: Low Risk    . Difficulty of Paying Living Expenses: Not hard at all  Food Insecurity: No Food Insecurity  . Worried About Charity fundraiser in the Last Year: Never true  . Ran Out of Food in the Last Year: Never true  Transportation Needs: No Transportation Needs  . Lack of Transportation (Medical): No  . Lack of Transportation (Non-Medical): No  Physical Activity: Inactive  . Days of Exercise per Week: 0 days  . Minutes of Exercise per Session: 0 min  Stress: No Stress Concern Present  . Feeling of Stress : Not at all  Social Connections: Moderately Isolated  . Frequency of Communication with Friends and Family: More than three times a week  . Frequency of Social Gatherings with Friends and Family: More than three times a week  . Attends Religious Services: More than 4 times per year  . Active Member of Clubs or Organizations: No  . Attends Archivist Meetings: Never  . Marital Status: Widowed  Intimate Partner Violence: Not At Risk  . Fear of Current or Ex-Partner: No  . Emotionally Abused: No  . Physically Abused: No  . Sexually Abused: No   Family History  Problem Relation Age of Onset  . Anuerysm Mother        Brain  . Dementia Mother   . Diabetes Mother   . Heart failure Father   . CAD Father   . Dementia Father   . Diabetes Father   . Lupus Sister   . Throat cancer Maternal Grandfather   . Diabetes Brother   . Breast cancer Neg Hx    Allergies  Allergen Reactions  . Lisinopril Cough  . Lovastatin Other (See Comments)    Myalgia  . Shellfish Allergy Rash       Medications: Outpatient Medications Prior to Visit  Medication Sig  . albuterol (VENTOLIN HFA) 108 (90 Base) MCG/ACT inhaler Inhale 1-2 puffs into the lungs every 6 (six) hours as needed for wheezing or shortness of breath.  Marland Kitchen aspirin EC 81 MG tablet Take 81 mg by mouth daily.  Marland Kitchen atorvastatin (LIPITOR) 80 MG tablet Take 1 tablet (80 mg total) by mouth daily.  . carboxymethylcellulose (REFRESH  PLUS) 0.5 % SOLN Apply 2 drops to eye 2 (two) times daily as needed.  . hydrochlorothiazide (HYDRODIURIL) 12.5 MG tablet Take 1 tablet (12.5 mg total) by mouth daily.  . hydrocortisone (ANUSOL-HC) 2.5 % rectal cream Place 1 application rectally 2 (two) times daily.  Marland Kitchen losartan (COZAAR) 100 MG tablet Take 1 tablet (100 mg total) by mouth daily.  Marland Kitchen omeprazole (PRILOSEC) 20 MG capsule Take 1 capsule (20 mg total) by mouth daily.  . pregabalin (LYRICA) 300 MG capsule Take 1 capsule (300 mg total) by mouth 2 (two) times daily.  Marland Kitchen VITAMIN D PO Take by mouth daily.  . [DISCONTINUED] benzonatate (TESSALON) 200 MG capsule Take 1 capsule (200 mg total) by mouth 3 (three) times daily as needed for cough. (Patient not taking: Reported on 08/06/2020)  . [DISCONTINUED] fluticasone furoate-vilanterol (BREO ELLIPTA) 100-25 MCG/INH AEPB Inhale into the lungs. (Patient not taking: Reported on  08/06/2020)  . [DISCONTINUED] gabapentin (NEURONTIN) 600 MG tablet Take 1.5 tablets (900 mg total) by mouth every 6 (six) hours.  . [DISCONTINUED] promethazine-dextromethorphan (PROMETHAZINE-DM) 6.25-15 MG/5ML syrup Take 5 mLs by mouth 4 (four) times daily as needed for cough. (Patient not taking: Reported on 08/06/2020)   No facility-administered medications prior to visit.    Review of Systems  Constitutional: Negative.   HENT: Negative.   Eyes: Negative.   Respiratory: Negative.   Cardiovascular: Negative.   Gastrointestinal: Negative.   Endocrine: Negative.   Genitourinary: Negative.   Musculoskeletal: Negative.   Skin: Negative.   Allergic/Immunologic: Negative.   Neurological: Negative.   Hematological: Negative.   Psychiatric/Behavioral: Negative.     Last CBC Lab Results  Component Value Date   WBC 9.7 03/13/2020   HGB 13.7 03/13/2020   HCT 42.3 03/13/2020   MCV 83 03/13/2020   MCH 26.8 03/13/2020   RDW 13.9 03/13/2020   PLT 254 63/78/5885   Last metabolic panel Lab Results  Component Value Date    GLUCOSE 113 (H) 03/13/2020   NA 140 03/13/2020   K 4.0 03/13/2020   CL 102 03/13/2020   CO2 23 03/13/2020   BUN 11 03/13/2020   CREATININE 0.98 03/13/2020   GFRNONAA 58 (L) 03/13/2020   GFRAA 67 03/13/2020   CALCIUM 9.6 03/13/2020   PROT 6.7 03/13/2020   ALBUMIN 3.8 03/13/2020   LABGLOB 2.9 03/13/2020   AGRATIO 1.3 03/13/2020   BILITOT 0.6 03/13/2020   ALKPHOS 95 03/13/2020   AST 12 03/13/2020   ALT 10 03/13/2020   ANIONGAP 6 12/03/2016   Last lipids Lab Results  Component Value Date   CHOL 137 03/13/2020   HDL 40 03/13/2020   LDLCALC 77 03/13/2020   TRIG 109 03/13/2020   CHOLHDL 3.4 03/13/2020   Last hemoglobin A1c Lab Results  Component Value Date   HGBA1C 6.6 (H) 03/13/2020   Last thyroid functions Lab Results  Component Value Date   TSH 1.910 03/13/2020   Last vitamin D Lab Results  Component Value Date   25OHVITD2 5.1 07/25/2016   25OHVITD3 27 07/25/2016   VD25OH 56.7 03/13/2020   Last vitamin B12 and Folate Lab Results  Component Value Date   VITAMINB12 375 07/25/2016        Objective    BP 122/68 (BP Location: Right Arm, Patient Position: Sitting, Cuff Size: Large)   Pulse 98   Temp 97.8 F (36.6 C) (Oral)   Ht 5\' 3"  (1.6 m)   Wt 236 lb (107 kg)   SpO2 100%   BMI 41.81 kg/m  BP Readings from Last 3 Encounters:  08/06/20 122/68  07/09/20 (!) 99/52  03/06/20 129/85   Wt Readings from Last 3 Encounters:  08/06/20 236 lb (107 kg)  07/09/20 240 lb (108.9 kg)  03/06/20 235 lb (106.6 kg)      Physical Exam Vitals reviewed.  Constitutional:      General: She is not in acute distress.    Appearance: Normal appearance. She is well-developed. She is not diaphoretic.  HENT:     Head: Normocephalic and atraumatic.  Eyes:     General: No scleral icterus.    Conjunctiva/sclera: Conjunctivae normal.  Neck:     Thyroid: No thyromegaly.  Cardiovascular:     Rate and Rhythm: Normal rate and regular rhythm.     Pulses: Normal pulses.      Heart sounds: Normal heart sounds. No murmur heard.   Pulmonary:     Effort: Pulmonary  effort is normal. No respiratory distress.     Breath sounds: Normal breath sounds. No wheezing, rhonchi or rales.  Musculoskeletal:     Cervical back: Neck supple.     Right lower leg: No edema.     Left lower leg: No edema.  Lymphadenopathy:     Cervical: No cervical adenopathy.  Skin:    General: Skin is warm and dry.     Findings: No rash.  Neurological:     Mental Status: She is alert and oriented to person, place, and time. Mental status is at baseline.  Psychiatric:        Mood and Affect: Mood normal.        Behavior: Behavior normal.       No results found for any visits on 08/06/20.  Assessment & Plan     Problem List Items Addressed This Visit      Cardiovascular and Mediastinum   Hypertension associated with diabetes ([place])    Well controlled Continue current medications Recheck metabolic panel F/u in 6 months       Relevant Medications   Semaglutide,0.25 or 0.5MG /DOS, (OZEMPIC, 0.25 OR 0.5 MG/DOSE,) 2 MG/1.5ML SOPN   Other Relevant Orders   Basic Metabolic Panel (BMET)     Respiratory   Asthma    Resume breo - refills given today      Relevant Medications   fluticasone furoate-vilanterol (BREO ELLIPTA) 100-25 MCG/INH AEPB     Endocrine   Diabetes ([place]) - Primary    Previously well controlled with diet no medications Recheck A1c Up-to-date on foot exam, eye exam, vaccinations On statin and ARB Advised on diet and exercise Given morbid obesity and difficulty losing weight, we did discuss GLP-1 agonist and will start Ozempic at low-dose today      Relevant Medications   Semaglutide,0.25 or 0.5MG /DOS, (OZEMPIC, 0.25 OR 0.5 MG/DOSE,) 2 MG/1.5ML SOPN   Other Relevant Orders   Hemoglobin A1c   Microalbuminuria due to type 2 diabetes mellitus (HCC)    Continue losartan      Relevant Medications   Semaglutide,0.25 or 0.5MG /DOS, (OZEMPIC, 0.25 OR 0.5  MG/DOSE,) 2 MG/1.5ML SOPN   Hyperlipidemia associated with type 2 diabetes mellitus ([place])    Previously well controlled Reviewed last lipid panel Continue statin at current dose      Relevant Medications   Semaglutide,0.25 or 0.5MG /DOS, (OZEMPIC, 0.25 OR 0.5 MG/DOSE,) 2 MG/1.5ML SOPN     Nervous and Auditory   Postherpetic neuralgia (Chronic)    Chronic pain resulting from postherpetic neuralgia Continue gabapentin and Lyrica Followed by neurology Gabapentin refilled today      Relevant Medications   gabapentin (NEURONTIN) 600 MG tablet     Other   Neuropathic pain (Chronic)    Followed by neurology Chronic and stable Continue gabapentin and Lyrica at current doses      Relevant Medications   gabapentin (NEURONTIN) 600 MG tablet   Morbid obesity ([place])    Discussed importance of healthy weight management Discussed diet and exercise Start Ozempic as above      Relevant Medications   Semaglutide,0.25 or 0.5MG /DOS, (OZEMPIC, 0.25 OR 0.5 MG/DOSE,) 2 MG/1.5ML SOPN   Avitaminosis D    Recheck level Continue supplement       Other Visit Diagnoses    BMI 40.0-44.9, adult (HCC)       Relevant Medications   Semaglutide,0.25 or 0.5MG /DOS, (OZEMPIC, 0.25 OR 0.5 MG/DOSE,) 2 MG/1.5ML SOPN      Return in about 3 months (around  11/03/2020) for chronic disease f/u.      I, Lavon Paganini, MD, have reviewed all documentation for this visit. The documentation on 08/06/20 for the exam, diagnosis, procedures, and orders are all accurate and complete.   Duffy Dantonio, Dionne Bucy, MD, MPH [facility]

## 2020-08-03 NOTE — Patient Instructions (Addendum)
Semaglutide injection solution What is this medicine? SEMAGLUTIDE (Sem a GLOO tide) is used to improve blood sugar control in adults with type 2 diabetes. This medicine may be used with other diabetes medicines. This drug may also reduce the risk of heart attack or stroke if you have type 2 diabetes and risk factors for heart disease. This medicine may be used for other purposes; ask your health care provider or pharmacist if you have questions. COMMON BRAND NAME(S): OZEMPIC What should I tell my health care provider before I take this medicine? They need to know if you have any of these conditions:  endocrine tumors (MEN 2) or if someone in your family had these tumors  eye disease, vision problems  history of pancreatitis  kidney disease  stomach problems  thyroid cancer or if someone in your family had thyroid cancer  an unusual or allergic reaction to semaglutide, other medicines, foods, dyes, or preservatives  pregnant or trying to get pregnant  breast-feeding How should I use this medicine? This medicine is for injection under the skin of your upper leg (thigh), stomach area, or upper arm. It is given once every week (every 7 days). You will be taught how to prepare and give this medicine. Use exactly as directed. Take your medicine at regular intervals. Do not take it more often than directed. If you use this medicine with insulin, you should inject this medicine and the insulin separately. Do not mix them together. Do not give the injections right next to each other. Change (rotate) injection sites with each injection. It is important that you put your used needles and syringes in a special sharps container. Do not put them in a trash can. If you do not have a sharps container, call your pharmacist or healthcare provider to get one. A special MedGuide will be given to you by the pharmacist with each prescription and refill. Be sure to read this information carefully each  time. This drug comes with INSTRUCTIONS FOR USE. Ask your pharmacist for directions on how to use this drug. Read the information carefully. Talk to your pharmacist or health care provider if you have questions. Talk to your pediatrician regarding the use of this medicine in children. Special care may be needed. Overdosage: If you think you have taken too much of this medicine contact a poison control center or emergency room at once. NOTE: This medicine is only for you. Do not share this medicine with others. What if I miss a dose? If you miss a dose, take it as soon as you can within 5 days after the missed dose. Then take your next dose at your regular weekly time. If it has been longer than 5 days after the missed dose, do not take the missed dose. Take the next dose at your regular time. Do not take double or extra doses. If you have questions about a missed dose, contact your health care provider for advice. What may interact with this medicine?  other medicines for diabetes Many medications may cause changes in blood sugar, these include:  alcohol containing beverages  antiviral medicines for HIV or AIDS  aspirin and aspirin-like drugs  certain medicines for blood pressure, heart disease, irregular heart beat  chromium  diuretics  female hormones, such as estrogens or progestins, birth control pills  fenofibrate  gemfibrozil  isoniazid  lanreotide  female hormones or anabolic steroids  MAOIs like Carbex, Eldepryl, Marplan, Nardil, and Parnate  medicines for weight loss  medicines for   allergies, asthma, cold, or cough  medicines for depression, anxiety, or psychotic disturbances  niacin  nicotine  NSAIDs, medicines for pain and inflammation, like ibuprofen or naproxen  octreotide  pasireotide  pentamidine  phenytoin  probenecid  quinolone antibiotics such as ciprofloxacin, levofloxacin, ofloxacin  some herbal dietary supplements  steroid medicines  such as prednisone or cortisone  sulfamethoxazole; trimethoprim  thyroid hormones Some medications can hide the warning symptoms of low blood sugar (hypoglycemia). You may need to monitor your blood sugar more closely if you are taking one of these medications. These include:  beta-blockers, often used for high blood pressure or heart problems (examples include atenolol, metoprolol, propranolol)  clonidine  guanethidine  reserpine This list may not describe all possible interactions. Give your health care provider a list of all the medicines, herbs, non-prescription drugs, or dietary supplements you use. Also tell them if you smoke, drink alcohol, or use illegal drugs. Some items may interact with your medicine. What should I watch for while using this medicine? Visit your doctor or health care professional for regular checks on your progress. Drink plenty of fluids while taking this medicine. Check with your doctor or health care professional if you get an attack of severe diarrhea, nausea, and vomiting. The loss of too much body fluid can make it dangerous for you to take this medicine. A test called the HbA1C (A1C) will be monitored. This is a simple blood test. It measures your blood sugar control over the last 2 to 3 months. You will receive this test every 3 to 6 months. Learn how to check your blood sugar. Learn the symptoms of low and high blood sugar and how to manage them. Always carry a quick-source of sugar with you in case you have symptoms of low blood sugar. Examples include hard sugar candy or glucose tablets. Make sure others know that you can choke if you eat or drink when you develop serious symptoms of low blood sugar, such as seizures or unconsciousness. They must get medical help at once. Tell your doctor or health care professional if you have high blood sugar. You might need to change the dose of your medicine. If you are sick or exercising more than usual, you might need  to change the dose of your medicine. Do not skip meals. Ask your doctor or health care professional if you should avoid alcohol. Many nonprescription cough and cold products contain sugar or alcohol. These can affect blood sugar. Pens should never be shared. Even if the needle is changed, sharing may result in passing of viruses like hepatitis or HIV. Wear a medical ID bracelet or chain, and carry a card that describes your disease and details of your medicine and dosage times. Do not become pregnant while taking this medicine. Women should inform their doctor if they wish to become pregnant or think they might be pregnant. There is a potential for serious side effects to an unborn child. Talk to your health care professional or pharmacist for more information. What side effects may I notice from receiving this medicine? Side effects that you should report to your doctor or health care professional as soon as possible:  allergic reactions like skin rash, itching or hives, swelling of the face, lips, or tongue  breathing problems  changes in vision  diarrhea that continues or is severe  lump or swelling on the neck  severe nausea  signs and symptoms of infection like fever or chills; cough; sore throat; pain or trouble   trouble swallowing unusual stomach upset or pain vomiting Side effects that usually do not require medical attention (report to your doctor or health care professional if they continue or are bothersome): constipation diarrhea nausea pain, redness, or irritation at site where injected stomach upset This list may not describe all possible side effects. Call your doctor for medical advice about side effects. You may report side  effects to FDA at 1-800-FDA-1088. Where should I keep my medicine? Keep out of the reach of children. Store unopened pens in a refrigerator between 2 and 8 degrees C (36 and 46 degrees F). Do not freeze. Protect from light and heat. After you first use the pen, it can be stored for 56 days at room temperature between 15 and 30 degrees C (59 and 86 degrees F) or in a refrigerator. Throw away your used pen after 56 days or after the expiration date, whichever comes first. Do not store your pen with the needle attached. If the needle is left on, medicine may leak from the pen. NOTE: This sheet is a summary. It may not cover all possible information. If you have questions about this medicine, talk to your doctor, pharmacist, or health care provider.  2021 Elsevier/Gold Standard (2019-02-15 09:41:51)  

## 2020-08-06 ENCOUNTER — Other Ambulatory Visit: Payer: Self-pay

## 2020-08-06 ENCOUNTER — Encounter: Payer: Self-pay | Admitting: Family Medicine

## 2020-08-06 ENCOUNTER — Ambulatory Visit (INDEPENDENT_AMBULATORY_CARE_PROVIDER_SITE_OTHER): Payer: Medicare Other | Admitting: Family Medicine

## 2020-08-06 VITALS — BP 122/68 | HR 98 | Temp 97.8°F | Ht 63.0 in | Wt 236.0 lb

## 2020-08-06 DIAGNOSIS — E785 Hyperlipidemia, unspecified: Secondary | ICD-10-CM | POA: Diagnosis not present

## 2020-08-06 DIAGNOSIS — Z6841 Body Mass Index (BMI) 40.0 and over, adult: Secondary | ICD-10-CM | POA: Diagnosis not present

## 2020-08-06 DIAGNOSIS — E559 Vitamin D deficiency, unspecified: Secondary | ICD-10-CM

## 2020-08-06 DIAGNOSIS — I10 Essential (primary) hypertension: Secondary | ICD-10-CM

## 2020-08-06 DIAGNOSIS — E1129 Type 2 diabetes mellitus with other diabetic kidney complication: Secondary | ICD-10-CM | POA: Diagnosis not present

## 2020-08-06 DIAGNOSIS — E1159 Type 2 diabetes mellitus with other circulatory complications: Secondary | ICD-10-CM | POA: Diagnosis not present

## 2020-08-06 DIAGNOSIS — J4540 Moderate persistent asthma, uncomplicated: Secondary | ICD-10-CM

## 2020-08-06 DIAGNOSIS — I152 Hypertension secondary to endocrine disorders: Secondary | ICD-10-CM

## 2020-08-06 DIAGNOSIS — M792 Neuralgia and neuritis, unspecified: Secondary | ICD-10-CM | POA: Diagnosis not present

## 2020-08-06 DIAGNOSIS — B0229 Other postherpetic nervous system involvement: Secondary | ICD-10-CM | POA: Diagnosis not present

## 2020-08-06 DIAGNOSIS — Z0000 Encounter for general adult medical examination without abnormal findings: Secondary | ICD-10-CM

## 2020-08-06 DIAGNOSIS — E1169 Type 2 diabetes mellitus with other specified complication: Secondary | ICD-10-CM

## 2020-08-06 DIAGNOSIS — R809 Proteinuria, unspecified: Secondary | ICD-10-CM

## 2020-08-06 MED ORDER — OZEMPIC (0.25 OR 0.5 MG/DOSE) 2 MG/1.5ML ~~LOC~~ SOPN: 0.5000 mg | PEN_INJECTOR | SUBCUTANEOUS | 3 refills | Status: DC

## 2020-08-06 MED ORDER — BD PEN NEEDLE MINI U/F 31G X 5 MM MISC: 1 refills | Status: DC

## 2020-08-06 MED ORDER — GABAPENTIN 600 MG PO TABS: 1200.0000 mg | ORAL_TABLET | Freq: Three times a day (TID) | ORAL | 3 refills | Status: DC

## 2020-08-06 MED ORDER — BREO ELLIPTA 100-25 MCG/INH IN AEPB: 1.0000 | INHALATION_SPRAY | Freq: Two times a day (BID) | RESPIRATORY_TRACT | 5 refills | Status: DC

## 2020-08-06 NOTE — Assessment & Plan Note (Signed)
Resume breo - refills given today

## 2020-08-06 NOTE — Assessment & Plan Note (Signed)
Followed by neurology Chronic and stable Continue gabapentin and Lyrica at current doses

## 2020-08-06 NOTE — Assessment & Plan Note (Signed)
Discussed importance of healthy weight management Discussed diet and exercise Start Ozempic as above

## 2020-08-06 NOTE — Assessment & Plan Note (Signed)
Chronic pain resulting from postherpetic neuralgia Continue gabapentin and Lyrica Followed by neurology Gabapentin refilled today

## 2020-08-06 NOTE — Assessment & Plan Note (Signed)
Previously well controlled Reviewed last lipid panel Continue statin at current dose

## 2020-08-06 NOTE — Assessment & Plan Note (Signed)
Recheck level Continue supplement

## 2020-08-06 NOTE — Assessment & Plan Note (Signed)
Well controlled Continue current medications Recheck metabolic panel F/u in 6 months  

## 2020-08-06 NOTE — Assessment & Plan Note (Signed)
Previously well controlled with diet no medications Recheck A1c Up-to-date on foot exam, eye exam, vaccinations On statin and ARB Advised on diet and exercise Given morbid obesity and difficulty losing weight, we did discuss GLP-1 agonist and will start Ozempic at low-dose today

## 2020-08-06 NOTE — Assessment & Plan Note (Signed)
Continue losartan. 

## 2020-08-07 LAB — BASIC METABOLIC PANEL
BUN/Creatinine Ratio: 10 — ABNORMAL LOW (ref 12–28)
BUN: 11 mg/dL (ref 8–27)
CO2: 24 mmol/L (ref 20–29)
Calcium: 9.5 mg/dL (ref 8.7–10.3)
Chloride: 103 mmol/L (ref 96–106)
Creatinine, Ser: 1.06 mg/dL — ABNORMAL HIGH (ref 0.57–1.00)
GFR calc Af Amer: 61 mL/min/{1.73_m2} (ref >59)
GFR calc non Af Amer: 53 mL/min/{1.73_m2} — ABNORMAL LOW (ref >59)
Glucose: 106 mg/dL — ABNORMAL HIGH (ref 65–99)
Potassium: 4.1 mmol/L (ref 3.5–5.2)
Sodium: 143 mmol/L (ref 134–144)

## 2020-08-07 LAB — HEMOGLOBIN A1C
Est. average glucose Bld gHb Est-mCnc: 146 mg/dL
Hgb A1c MFr Bld: 6.7 % — ABNORMAL HIGH (ref 4.8–5.6)

## 2020-09-06 ENCOUNTER — Other Ambulatory Visit: Payer: Self-pay

## 2020-09-06 ENCOUNTER — Ambulatory Visit (INDEPENDENT_AMBULATORY_CARE_PROVIDER_SITE_OTHER): Payer: Medicare Other | Admitting: Family Medicine

## 2020-09-06 ENCOUNTER — Encounter: Payer: Self-pay | Admitting: Family Medicine

## 2020-09-06 VITALS — BP 123/79 | HR 80 | Temp 98.6°F | Resp 16 | Ht 61.0 in | Wt 230.0 lb

## 2020-09-06 DIAGNOSIS — Z6841 Body Mass Index (BMI) 40.0 and over, adult: Secondary | ICD-10-CM

## 2020-09-06 DIAGNOSIS — I152 Hypertension secondary to endocrine disorders: Secondary | ICD-10-CM | POA: Diagnosis not present

## 2020-09-06 DIAGNOSIS — E1159 Type 2 diabetes mellitus with other circulatory complications: Secondary | ICD-10-CM | POA: Diagnosis not present

## 2020-09-06 DIAGNOSIS — E1169 Type 2 diabetes mellitus with other specified complication: Secondary | ICD-10-CM

## 2020-09-06 MED ORDER — OZEMPIC (1 MG/DOSE) 2 MG/1.5ML ~~LOC~~ SOPN: 1.0000 mg | PEN_INJECTOR | SUBCUTANEOUS | 3 refills | Status: DC

## 2020-09-06 NOTE — Assessment & Plan Note (Signed)
Well-controlled today No changes to medications Next labs at visit in 2 months

## 2020-09-06 NOTE — Progress Notes (Signed)
Established patient visit   Patient: [name]   DOB: 11/03/1950   70 y.o. Female  MRN: [id number] Visit Date: 09/06/2020  Today's healthcare provider: Lavon Paganini, MD   Chief Complaint  Patient presents with  . Obesity   Subjective    HPI  Follow up for weight  The patient was last seen for this 1 months ago. Changes made at last visit include start Ozempic.  She reports excellent compliance with treatment. She feels that condition is Improved. She is not having side effects.   Wt Readings from Last 3 Encounters:  09/06/20 230 lb (104.3 kg)  08/06/20 236 lb (107 kg)  07/09/20 240 lb (108.9 kg)   -----------------------------------------------------------------------------------------  Patient Active Problem List   Diagnosis Date Noted  . Hyperlipidemia associated with type 2 diabetes mellitus ([place]) 03/06/2020  . Special screening for malignant neoplasms, colon   . Benign neoplasm of cecum   . Bilateral bunions 10/12/2018  . Change in bowel habits 01/29/2018  . Rectal irritation 01/29/2018  . Postnasal drip 11/04/2017  . Other viral warts 11/04/2017  . Acute pain of both knees 08/07/2017  . Microalbuminuria due to type 2 diabetes mellitus ([place]) 03/26/2017  . Primary osteoarthritis involving multiple joints 12/05/2016  . Status post total left knee replacement 12/01/2016  . Primary osteoarthritis of left knee 09/07/2016  . Hypertension associated with diabetes ([place]) 07/24/2016  . Occipital headache 01/21/2016  . Bilateral occipital neuralgia 01/21/2016  . Chronic pain syndrome 05/07/2015  . [place]ian ganglionitis 03/12/2015  . Neuropathic pain 03/12/2015  . Herpes zoster ophthalmicus 03/12/2015  . Anxiety 01/11/2015  . Diabetes ([place]) 01/11/2015  . Photopsia 01/11/2015  . Avitaminosis A 01/11/2015  . Depression 01/10/2015  . Postherpetic neuralgia 11/22/2014  . Morbid obesity ([place]) 04/18/2014  . Neuropathic postherpetic trigeminal neuralgia  (Location of Primary Source of Pain) (Right) (V1) 12/21/2013  . Cannot sleep 03/13/2009  . Asthma 02/07/2009  . Calcium deficiency disease 10/16/2008  . Avitaminosis D 07/05/2008  . History of colon polyps 10/17/2005   Social History   Tobacco Use  . Smoking status: Never Smoker  . Smokeless tobacco: Never Used  Vaping Use  . Vaping Use: Never used  Substance Use Topics  . Alcohol use: No    Alcohol/week: 0.0 standard drinks  . Drug use: No   Allergies  Allergen Reactions  . Lisinopril Cough  . Lovastatin Other (See Comments)    Myalgia  . Shellfish Allergy Rash       Medications: Outpatient Medications Prior to Visit  Medication Sig  . albuterol (VENTOLIN HFA) 108 (90 Base) MCG/ACT inhaler Inhale 1-2 puffs into the lungs every 6 (six) hours as needed for wheezing or shortness of breath.  Marland Kitchen aspirin EC 81 MG tablet Take 81 mg by mouth daily.  Marland Kitchen atorvastatin (LIPITOR) 80 MG tablet Take 1 tablet (80 mg total) by mouth daily.  . carboxymethylcellulose (REFRESH PLUS) 0.5 % SOLN Apply 2 drops to eye 2 (two) times daily as needed.  . fluticasone furoate-vilanterol (BREO ELLIPTA) 100-25 MCG/INH AEPB Inhale 1 puff into the lungs 2 (two) times daily.  Marland Kitchen gabapentin (NEURONTIN) 600 MG tablet Take 2 tablets (1,200 mg total) by mouth 3 (three) times daily.  . hydrochlorothiazide (HYDRODIURIL) 12.5 MG tablet Take 1 tablet (12.5 mg total) by mouth daily.  . hydrocortisone (ANUSOL-HC) 2.5 % rectal cream Place 1 application rectally 2 (two) times daily.  . Insulin Pen Needle (B-D UF III MINI PEN NEEDLES) 31G X 5  MM MISC Use as directed  . losartan (COZAAR) 100 MG tablet Take 1 tablet (100 mg total) by mouth daily.  Marland Kitchen omeprazole (PRILOSEC) 20 MG capsule Take 1 capsule (20 mg total) by mouth daily.  . pregabalin (LYRICA) 300 MG capsule Take 1 capsule (300 mg total) by mouth 2 (two) times daily.  Marland Kitchen VITAMIN D PO Take by mouth daily.  . [DISCONTINUED] Semaglutide,0.25 or 0.5MG /DOS, (OZEMPIC,  0.25 OR 0.5 MG/DOSE,) 2 MG/1.5ML SOPN Inject 0.5 mg into the skin once a week.   No facility-administered medications prior to visit.    Review of Systems  Constitutional: Positive for appetite change and fatigue.  Respiratory: Negative for cough, chest tightness and shortness of breath.   Cardiovascular: Negative for chest pain and palpitations.    Last CBC Lab Results  Component Value Date   WBC 9.7 03/13/2020   HGB 13.7 03/13/2020   HCT 42.3 03/13/2020   MCV 83 03/13/2020   MCH 26.8 03/13/2020   RDW 13.9 03/13/2020   PLT 254 53/61/4431   Last metabolic panel Lab Results  Component Value Date   GLUCOSE 106 (H) 08/06/2020   NA 143 08/06/2020   K 4.1 08/06/2020   CL 103 08/06/2020   CO2 24 08/06/2020   BUN 11 08/06/2020   CREATININE 1.06 (H) 08/06/2020   GFRNONAA 53 (L) 08/06/2020   GFRAA 61 08/06/2020   CALCIUM 9.5 08/06/2020   PROT 6.7 03/13/2020   ALBUMIN 3.8 03/13/2020   LABGLOB 2.9 03/13/2020   AGRATIO 1.3 03/13/2020   BILITOT 0.6 03/13/2020   ALKPHOS 95 03/13/2020   AST 12 03/13/2020   ALT 10 03/13/2020   ANIONGAP 6 12/03/2016   Last lipids Lab Results  Component Value Date   CHOL 137 03/13/2020   HDL 40 03/13/2020   LDLCALC 77 03/13/2020   TRIG 109 03/13/2020   CHOLHDL 3.4 03/13/2020        Objective    BP 123/79 (BP Location: Left Arm, Patient Position: Sitting, Cuff Size: Large)   Pulse 80   Temp 98.6 F (37 C) (Oral)   Resp 16   Ht 5\' 1"  (1.549 m)   Wt 230 lb (104.3 kg)   SpO2 99%   BMI 43.46 kg/m  BP Readings from Last 3 Encounters:  09/06/20 123/79  08/06/20 122/68  07/09/20 (!) 99/52   Wt Readings from Last 3 Encounters:  09/06/20 230 lb (104.3 kg)  08/06/20 236 lb (107 kg)  07/09/20 240 lb (108.9 kg)      Physical Exam Vitals reviewed.  Constitutional:      General: She is not in acute distress.    Appearance: Normal appearance. She is well-developed. She is not diaphoretic.  HENT:     Head: Normocephalic and  atraumatic.  Eyes:     General: No scleral icterus.    Conjunctiva/sclera: Conjunctivae normal.  Neck:     Thyroid: No thyromegaly.  Cardiovascular:     Rate and Rhythm: Normal rate and regular rhythm.     Pulses: Normal pulses.     Heart sounds: Normal heart sounds. No murmur heard.   Pulmonary:     Effort: Pulmonary effort is normal. No respiratory distress.     Breath sounds: Normal breath sounds. No wheezing, rhonchi or rales.  Musculoskeletal:     Cervical back: Neck supple.     Right lower leg: No edema.     Left lower leg: No edema.  Lymphadenopathy:     Cervical: No cervical adenopathy.  Skin:  General: Skin is warm and dry.     Findings: No rash.  Neurological:     Mental Status: She is alert and oriented to person, place, and time. Mental status is at baseline.  Psychiatric:        Mood and Affect: Mood normal.        Behavior: Behavior normal.       No results found for any visits on 09/06/20.  Assessment & Plan     Problem List Items Addressed This Visit      Cardiovascular and Mediastinum   Hypertension associated with diabetes ([place])    Well-controlled today No changes to medications Next labs at visit in 2 months      Relevant Medications   Semaglutide, 1 MG/DOSE, (OZEMPIC, 1 MG/DOSE,) 2 MG/1.5ML SOPN     Endocrine   Diabetes ([place]) - Primary    Next routine DM check upcoming in 2 months Will check screenings and vaccinations at that time and recheck A1c Doing well on Ozempic since starting it and side effects have resolved Increase dose to 1 mg weekly      Relevant Medications   Semaglutide, 1 MG/DOSE, (OZEMPIC, 1 MG/DOSE,) 2 MG/1.5ML SOPN     Other   Morbid obesity ([place])    As above, tolerating Ozempic well Congratulated on 6 pound weight loss Increase Ozempic to 1 mg weekly Discussed importance of healthy weight management Discussed diet and exercise       Relevant Medications   Semaglutide, 1 MG/DOSE, (OZEMPIC, 1 MG/DOSE,) 2  MG/1.5ML SOPN    Other Visit Diagnoses    BMI 40.0-44.9, adult (H[facility])       Relevant Medications   Semaglutide, 1 MG/DOSE, (OZEMPIC, 1 MG/DOSE,) 2 MG/1.5ML SOPN       Return for as scheduled.      I, Lavon Paganini, MD, have reviewed all documentation for this visit. The documentation on 09/06/20 for the exam, diagnosis, procedures, and orders are all accurate and complete.   Bacigalupo, Dionne Bucy, MD, MPH [facility]

## 2020-09-06 NOTE — Assessment & Plan Note (Signed)
Next routine DM check upcoming in 2 months Will check screenings and vaccinations at that time and recheck A1c Doing well on Ozempic since starting it and side effects have resolved Increase dose to 1 mg weekly

## 2020-09-06 NOTE — Assessment & Plan Note (Signed)
As above, tolerating Ozempic well Congratulated on 6 pound weight loss Increase Ozempic to 1 mg weekly Discussed importance of healthy weight management Discussed diet and exercise

## 2020-10-03 ENCOUNTER — Ambulatory Visit: Payer: Medicare Other | Admitting: Family Medicine

## 2020-10-03 ENCOUNTER — Encounter: Payer: Self-pay | Admitting: Adult Health

## 2020-10-03 ENCOUNTER — Other Ambulatory Visit: Payer: Self-pay

## 2020-10-03 ENCOUNTER — Ambulatory Visit (INDEPENDENT_AMBULATORY_CARE_PROVIDER_SITE_OTHER): Payer: Medicare Other | Admitting: Adult Health

## 2020-10-03 VITALS — BP 130/80 | HR 81 | Resp 16 | Wt 230.8 lb

## 2020-10-03 DIAGNOSIS — B372 Candidiasis of skin and nail: Secondary | ICD-10-CM | POA: Diagnosis not present

## 2020-10-03 DIAGNOSIS — R21 Rash and other nonspecific skin eruption: Secondary | ICD-10-CM | POA: Diagnosis not present

## 2020-10-03 MED ORDER — KETOCONAZOLE 2 % EX CREA: 1.0000 "application " | TOPICAL_CREAM | Freq: Every day | CUTANEOUS | 0 refills | Status: DC

## 2020-10-03 MED ORDER — NYSTATIN 100000 UNIT/GM EX POWD: 1.0000 "application " | Freq: Three times a day (TID) | CUTANEOUS | 0 refills | Status: DC

## 2020-10-03 MED ORDER — FLUCONAZOLE 150 MG PO TABS: 150.0000 mg | ORAL_TABLET | ORAL | 0 refills | Status: DC

## 2020-10-03 NOTE — Patient Instructions (Signed)
Intertrigo Intertrigo is skin irritation (inflammation) that happens in warm, moist areas of the body. The irritation can cause a rash and make skin raw and itchy. The rash is usually pink or red. It happens mostly between folds of skin or where skin rubs together, such as:  Between the toes.  In the armpits.  In the groin area.  Under the belly.  Under the breasts.  Around the butt area. This condition is not passed from person to person (is not contagious). What are the causes?  Heat, moisture, rubbing, and not enough air movement.  The condition can be made worse by: ? Sweat. ? Bacteria. ? A fungus, such as yeast. What increases the risk?  Moisture in your skin folds.  You are more likely to develop this condition if you: ? Have diabetes. ? Are overweight. ? Are not able to move around. ? Live in a warm and moist climate. ? Wear splints, braces, or other medical devices. ? Are not able to control your pee (urine) or poop (stool). What are the signs or symptoms?  A pink or red skin rash in the skin fold or near the skin fold.  Raw or scaly skin.  Itching.  A burning feeling.  Bleeding.  Leaking fluid.  A bad smell. How is this treated?  Cleaning and drying your skin.  Taking an antibiotic medicine or using an antibiotic skin cream for a bacterial infection.  Using an antifungal cream on your skin or taking pills for an infection that was caused by a fungus, such as yeast.  Using a steroid ointment to stop the itching and irritation.  Separating the skin fold with a clean cotton cloth to absorb moisture and allow air to flow into the area. Follow these instructions at home:  Keep the affected area clean and dry.  Do not scratch your skin.  Stay cool as much as you can. Use an air conditioner or a fan, if you have one.  Apply over-the-counter and prescription medicines only as told by your doctor.  If you were prescribed an antibiotic medicine,  use it as told by your doctor. Do not stop using the antibiotic even if your condition starts to get better.  Keep all follow-up visits as told by your doctor. This is important. How is this prevented?  Stay at a healthy weight.  Take care of your feet. This is very important if you have diabetes. You should: ? Wear shoes that fit well. ? Keep your feet dry. ? Wear clean cotton or wool socks.  Protect the skin in your groin and butt area as told by your doctor. To do this: ? Follow a regular cleaning routine. ? Use creams, powders, or ointments that protect your skin. ? Change protection pads often.  Do not wear tight clothes. Wear clothes that: ? Are loose. ? Take moisture away from your body. ? Are made of cotton.  Wear a bra that gives good support, if needed.  Shower and dry yourself well after being active. Use a hair dryer on a cool setting to dry between skin folds.  Keep your blood sugar under control if you have diabetes.   Contact a doctor if:  Your symptoms do not get better with treatment.  Your symptoms get worse or they spread.  You notice more redness and warmth.  You have a fever. Summary  Intertrigo is skin irritation that occurs when folds of skin rub together.  This condition is caused by heat,   moisture, and rubbing.  This condition may be treated by cleaning and drying your skin and with medicines.  Apply over-the-counter and prescription medicines only as told by your doctor.  Keep all follow-up visits as told by your doctor. This is important. This information is not intended to replace advice given to you by your health care provider. Make sure you discuss any questions you have with your health care provider. Document Revised: 03/11/2018 Document Reviewed: 03/11/2018 Elsevier Patient Education  2021 [place].   Fluconazole tablets What is this medicine? FLUCONAZOLE (floo KON na zole) is an antifungal medicine. It is used to treat  certain kinds of fungal or yeast infections. This medicine may be used for other purposes; ask your health care provider or pharmacist if you have questions. COMMON BRAND NAME(S): Diflucan What should I tell my health care provider before I take this medicine? They need to know if you have any of these conditions:  irregular heartbeat or rhythm  kidney disease  liver disease  low levels of potassium in the blood  an unusual or allergic reaction to fluconazole, other azole antifungals, medicines, foods, dyes, or preservatives  pregnant or trying to get pregnant  breast-feeding How should I use this medicine? Take this medicine by mouth. Follow the directions on the prescription label. Do not take your medicine more often than directed. Talk to your pediatrician regarding the use of this medicine in children. Special care may be needed. This medicine has been used in children as young as 51 months of age. Overdosage: If you think you have taken too much of this medicine contact a poison control center or emergency room at once. NOTE: This medicine is only for you. Do not share this medicine with others. What if I miss a dose? If you miss a dose, take it as soon as you can. If it is almost time for your next dose, take only that dose. Do not take double or extra doses. What may interact with this medicine? Do not take this medicine with any of the following medications:  flibanserin  lomitapide  lonafarnib  other medicines that prolong the QT interval (cause an abnormal heart rhythm)  triazolam This medicine may also interact with the following medications:  certain antibiotics like rifabutin, rifampin  certain antivirals for HIV or hepatitis  certain medicines for blood pressure, heart disease, irregular heartbeat  certain medicines for cholesterol like atorvastatin, lovastatin, and simvastatin  certain medicines for depression, like amitriptyline,  nortriptyline  certain medicines for diabetes like glipizide or glyburide  certain medicines for seizures like carbamazepine, phenytoin  certain medicines that treat or prevent blood clots like warfarin  certain narcotic medicines for pain like alfentanil, fentanyl, methadone  cyclophosphamide  cyclosporine  ibrutinib  lemborexant  midazolam  NSAIDS, medicines for pain and inflammation, like ibuprofen or naproxen  olaparib  sirolimus  steroid medicines like prednisone  tacrolimus  theophylline  tofacitinib  tolvaptan  vinblastine  vincristine  vitamin A  voriconazole This list may not describe all possible interactions. Give your health care provider a list of all the medicines, herbs, non-prescription drugs, or dietary supplements you use. Also tell them if you smoke, drink alcohol, or use illegal drugs. Some items may interact with your medicine. What should I watch for while using this medicine? Visit your doctor or health care professional for regular checkups. If you are taking this medicine for a long time you may need blood work. Tell your doctor if your symptoms do not  improve. Some fungal infections need many weeks or months of treatment to cure. Alcohol can increase possible damage to your liver. Avoid alcoholic drinks. If you have a vaginal infection, do not have sex until you have finished your treatment. You can wear a sanitary napkin. Do not use tampons. Wear freshly washed cotton, not synthetic, panties. What side effects may I notice from receiving this medicine? Side effects that you should report to your doctor or health care professional as soon as possible:  allergic reactions like skin rash or itching, hives, swelling of the lips, mouth, tongue, or throat  dark urine  feeling dizzy or faint  irregular heartbeat or chest pain  redness, blistering, peeling or loosening of the skin, including inside the mouth  trouble breathing  unusual  bruising or bleeding  vomiting  yellowing of the eyes or skin Side effects that usually do not require medical attention (report to your doctor or health care professional if they continue or are bothersome):  changes in how food tastes  diarrhea  headache  stomach upset or nausea This list may not describe all possible side effects. Call your doctor for medical advice about side effects. You may report side effects to FDA at 1-800-FDA-1088. Where should I keep my medicine? Keep out of the reach of children. Store at room temperature below 30 degrees C (86 degrees F). Throw away any medicine after the expiration date. NOTE: This sheet is a summary. It may not cover all possible information. If you have questions about this medicine, talk to your doctor, pharmacist, or health care provider.  2021 Elsevier/Gold Standard (2020-04-11 08:51:42)

## 2020-10-03 NOTE — Progress Notes (Signed)
Established patient visit   Patient: [name]   DOB: [birth year]   70 y.o. Female  MRN: [id number] Visit Date: 10/03/2020  Today's healthcare provider: Marcille Buffy, FNP   Chief Complaint  Patient presents with  . Rash   Subjective    Rash This is a new problem. The current episode started more than 1 month ago (3 months ago. ). The problem is unchanged. The affected locations include the abdomen. The rash is characterized by itchiness and redness. She was exposed to a new detergent/soap. Pertinent negatives include no anorexia, congestion, cough, diarrhea, eye pain, facial edema, fatigue, fever, joint pain, nail changes, rhinorrhea, shortness of breath, sore throat or vomiting. Past treatments include anti-itch cream (vaseline). The treatment provided mild relief. There is no history of eczema.   Diabetic A 1c 6.7 2 months ago.   Denies any antibiotic use.  Patient  denies any fever, body aches,chills, rash, chest pain, shortness of breath, nausea, vomiting, or diarrhea.       Medications: Outpatient Medications Prior to Visit  Medication Sig  . albuterol (VENTOLIN HFA) 108 (90 Base) MCG/ACT inhaler Inhale 1-2 puffs into the lungs every 6 (six) hours as needed for wheezing or shortness of breath.  Marland Kitchen aspirin EC 81 MG tablet Take 81 mg by mouth daily.  Marland Kitchen atorvastatin (LIPITOR) 80 MG tablet Take 1 tablet (80 mg total) by mouth daily.  . carboxymethylcellulose (REFRESH PLUS) 0.5 % SOLN Apply 2 drops to eye 2 (two) times daily as needed.  . fluticasone furoate-vilanterol (BREO ELLIPTA) 100-25 MCG/INH AEPB Inhale 1 puff into the lungs 2 (two) times daily.  Marland Kitchen gabapentin (NEURONTIN) 600 MG tablet Take 2 tablets (1,200 mg total) by mouth 3 (three) times daily.  . hydrochlorothiazide (HYDRODIURIL) 12.5 MG tablet Take 1 tablet (12.5 mg total) by mouth daily.  . hydrocortisone (ANUSOL-HC) 2.5 % rectal cream Place 1 application rectally 2 (two) times daily.  . Insulin  Pen Needle (B-D UF III MINI PEN NEEDLES) 31G X 5 MM MISC Use as directed  . losartan (COZAAR) 100 MG tablet Take 1 tablet (100 mg total) by mouth daily.  Marland Kitchen omeprazole (PRILOSEC) 20 MG capsule Take 1 capsule (20 mg total) by mouth daily.  . Semaglutide, 1 MG/DOSE, (OZEMPIC, 1 MG/DOSE,) 2 MG/1.5ML SOPN Inject 1 mg into the skin once a week.  Marland Kitchen VITAMIN D PO Take by mouth daily.  . pregabalin (LYRICA) 300 MG capsule Take 1 capsule (300 mg total) by mouth 2 (two) times daily.   No facility-administered medications prior to visit.    Review of Systems  Constitutional: Negative.  Negative for fatigue and fever.  HENT: Negative.  Negative for congestion, rhinorrhea and sore throat.   Eyes: Negative for pain.  Respiratory: Negative.  Negative for cough and shortness of breath.   Cardiovascular: Negative.   Gastrointestinal: Negative.  Negative for anorexia, diarrhea and vomiting.  Genitourinary: Negative.   Musculoskeletal: Negative.  Negative for joint pain.  Skin: Positive for color change and rash. Negative for nail changes, pallor and wound.  Neurological: Negative.   Psychiatric/Behavioral: Negative.        Objective    BP 130/80   Pulse 81   Resp 16   Wt 230 lb 12.8 oz (104.7 kg)   SpO2 99%   BMI 43.61 kg/m  BP Readings from Last 3 Encounters:  10/03/20 130/80  09/06/20 123/79  08/06/20 122/68   Wt Readings from Last 3 Encounters:  10/03/20 230  lb 12.8 oz (104.7 kg)  09/06/20 230 lb (104.3 kg)  08/06/20 236 lb (107 kg)       Physical Exam Vitals reviewed.  Constitutional:      Appearance: She is obese. She is not ill-appearing or diaphoretic.  HENT:     Head: Normocephalic and atraumatic.     Right Ear: External ear normal.  Eyes:     Conjunctiva/sclera: Conjunctivae normal.  Cardiovascular:     Rate and Rhythm: Normal rate and regular rhythm.     Pulses: Normal pulses.     Heart sounds: Normal heart sounds.  Pulmonary:     Effort: Pulmonary effort is normal.      Breath sounds: Normal breath sounds.  Abdominal:     General: There is no distension.     Palpations: Abdomen is soft.     Tenderness: There is no abdominal tenderness.  Musculoskeletal:        General: Normal range of motion.     Cervical back: Normal range of motion.  Skin:    Findings: Erythema and rash present.          Comments: See diagram bilateral thigh crease/ groin and under abdominal fold of skin erythematous, moist, skin. No drainage or warmth. Fungal in appearance.   Neurological:     Mental Status: She is oriented to person, place, and time.  Psychiatric:        Mood and Affect: Mood normal.        Behavior: Behavior normal.        Thought Content: Thought content normal.        Judgment: Judgment normal.       No results found for any visits on 10/03/20.  Assessment & Plan     Rash - Plan: CBC with Differential/Platelet, Comprehensive Metabolic Panel (CMET)  Intertriginous candidiasis - Plan: CBC with Differential/Platelet, Comprehensive Metabolic Panel (CMET), ketoconazole (NIZORAL) 2 % cream, nystatin (MYCOSTATIN/NYSTOP) powder, fluconazole (DIFLUCAN) 150 MG tablet   Meds ordered this encounter  Medications  . ketoconazole (NIZORAL) 2 % cream    Sig: Apply 1 application topically daily.    Dispense:  60 g    Refill:  0  . nystatin (MYCOSTATIN/NYSTOP) powder    Sig: Apply 1 application topically 3 (three) times daily.    Dispense:  60 g    Refill:  0  . fluconazole (DIFLUCAN) 150 MG tablet    Sig: Take 1 tablet (150 mg total) by mouth as directed. Take one tablet by mouth on day 1. May repeat dose of one tablet by mouth on day five.    Dispense:  2 tablet    Refill:  0    Will give her oral diflucan given the appearence of rash, no signs of infection or cellulitis, will check CBC,  Nystatin powder for future prevention as she has large abdominal skin fold increased fungal infection risk.  Will give ketoconazole cream for use as directed likely  for 6 months.   Ok to send to dermatology if not improving.   Prevention discussed, and skin care patting dry, avoiding moisture.  Return in about 6 weeks (around 11/14/2020), or if symptoms worsen or fail to improve, for at any time for any worsening symptoms, Go to Emergency room/ urgent care if worse.      The entirety of the information documented in the History of Present Illness, Review of Systems and Physical Exam were personally obtained by me. Portions of this information were initially documented by  the CMA and reviewed by me for thoroughness and accuracy.  Marcille Buffy, [place] [phone] (phone) [phone] (fax)  [place]

## 2020-10-04 LAB — COMPREHENSIVE METABOLIC PANEL
ALT: 11 IU/L (ref 0–32)
AST: 12 IU/L (ref 0–40)
Albumin/Globulin Ratio: 1.5 (ref 1.2–2.2)
Albumin: 4 g/dL (ref 3.7–4.7)
Alkaline Phosphatase: 82 IU/L (ref 44–121)
BUN/Creatinine Ratio: 13 (ref 12–28)
BUN: 13 mg/dL (ref 8–27)
Bilirubin Total: 0.5 mg/dL (ref 0.0–1.2)
CO2: 21 mmol/L (ref 20–29)
Calcium: 9 mg/dL (ref 8.7–10.3)
Chloride: 102 mmol/L (ref 96–106)
Creatinine, Ser: 1.02 mg/dL — ABNORMAL HIGH (ref 0.57–1.00)
Globulin, Total: 2.7 g/dL (ref 1.5–4.5)
Glucose: 85 mg/dL (ref 65–99)
Potassium: 3.7 mmol/L (ref 3.5–5.2)
Sodium: 140 mmol/L (ref 134–144)
Total Protein: 6.7 g/dL (ref 6.0–8.5)
eGFR: 59 mL/min/{1.73_m2} — ABNORMAL LOW (ref >59)

## 2020-10-04 LAB — CBC WITH DIFFERENTIAL/PLATELET
Basophils Absolute: 0.1 10*3/uL (ref 0.0–0.2)
Basos: 1 %
EOS (ABSOLUTE): 0.2 10*3/uL (ref 0.0–0.4)
Eos: 1 %
Hematocrit: 42.3 % (ref 34.0–46.6)
Hemoglobin: 13.5 g/dL (ref 11.1–15.9)
Immature Grans (Abs): 0.1 10*3/uL (ref 0.0–0.1)
Immature Granulocytes: 1 %
Lymphocytes Absolute: 2 10*3/uL (ref 0.7–3.1)
Lymphs: 18 %
MCH: 26.4 pg — ABNORMAL LOW (ref 26.6–33.0)
MCHC: 31.9 g/dL (ref 31.5–35.7)
MCV: 83 fL (ref 79–97)
Monocytes Absolute: 1 10*3/uL — ABNORMAL HIGH (ref 0.1–0.9)
Monocytes: 8 %
Neutrophils Absolute: 8.1 10*3/uL — ABNORMAL HIGH (ref 1.4–7.0)
Neutrophils: 71 %
Platelets: 280 10*3/uL (ref 150–450)
RBC: 5.11 x10E6/uL (ref 3.77–5.28)
RDW: 13.4 % (ref 11.7–15.4)
WBC: 11.5 10*3/uL — ABNORMAL HIGH (ref 3.4–10.8)

## 2020-10-10 ENCOUNTER — Ambulatory Visit (INDEPENDENT_AMBULATORY_CARE_PROVIDER_SITE_OTHER): Payer: Medicare Other | Admitting: Family Medicine

## 2020-10-10 ENCOUNTER — Encounter: Payer: Self-pay | Admitting: Family Medicine

## 2020-10-10 ENCOUNTER — Other Ambulatory Visit: Payer: Self-pay

## 2020-10-10 VITALS — BP 134/84 | HR 83 | Temp 97.9°F | Wt 226.0 lb

## 2020-10-10 DIAGNOSIS — L299 Pruritus, unspecified: Secondary | ICD-10-CM

## 2020-10-10 MED ORDER — TRIAMCINOLONE ACETONIDE 0.5 % EX OINT: 1.0000 "application " | TOPICAL_OINTMENT | Freq: Two times a day (BID) | CUTANEOUS | 0 refills | Status: DC

## 2020-10-10 NOTE — Patient Instructions (Signed)
Rash, Adult  A rash is a change in the color of your skin. A rash can also change the way your skin feels. There are many different conditions and factors that can cause a rash. Follow these instructions at home: The goal of treatment is to stop the itching and keep the rash from spreading. Watch for any changes in your symptoms. Let your doctor know about them. Follow these instructions to help with your condition: Medicine Take or apply over-the-counter and prescription medicines only as told by your doctor. These may include medicines:  To treat red or swollen skin (corticosteroid creams).  To treat itching.  To treat an allergy (oral antihistamines).  To treat very bad symptoms (oral corticosteroids).   Skin care  Put cool cloths (compresses) on the affected areas.  Do not scratch or rub your skin.  Avoid covering the rash. Make sure that the rash is exposed to air as much as possible. Managing itching and discomfort  Avoid hot showers or baths. These can make itching worse. A cold shower may help.  Try taking a bath with: ? Epsom salts. You can get these at your local pharmacy or grocery store. Follow the instructions on the package. ? Baking soda. Pour a small amount into the bath as told by your doctor. ? Colloidal oatmeal. You can get this at your local pharmacy or grocery store. Follow the instructions on the package.  Try putting baking soda paste onto your skin. Stir water into baking soda until it gets like a paste.  Try putting on a lotion that relieves itchiness (calamine lotion).  Keep cool and out of the sun. Sweating and being hot can make itching worse. General instructions  Rest as needed.  Drink enough fluid to keep your pee (urine) pale yellow.  Wear loose-fitting clothing.  Avoid scented soaps, detergents, and perfumes. Use gentle soaps, detergents, perfumes, and other cosmetic products.  Avoid anything that causes your rash. Keep a journal to help  track what causes your rash. Write down: ? What you eat. ? What cosmetic products you use. ? What you drink. ? What you wear. This includes jewelry.  Keep all follow-up visits as told by your doctor. This is important.   Contact a doctor if:  You sweat at night.  You lose weight.  You pee (urinate) more than normal.  You pee less than normal, or you notice that your pee is a darker color than normal.  You feel weak.  You throw up (vomit).  Your skin or the whites of your eyes look yellow (jaundice).  Your skin: ? Tingles. ? Is numb.  Your rash: ? Does not go away after a few days. ? Gets worse.  You are: ? More thirsty than normal. ? More tired than normal.  You have: ? New symptoms. ? Pain in your belly (abdomen). ? A fever. ? Watery poop (diarrhea). Get help right away if:  You have a fever and your symptoms suddenly get worse.  You start to feel mixed up (confused).  You have a very bad headache or a stiff neck.  You have very bad joint pains or stiffness.  You have jerky movements that you cannot control (seizure).  Your rash covers all or most of your body. The rash may or may not be painful.  You have blisters that: ? Are on top of the rash. ? Grow larger. ? Grow together. ? Are painful. ? Are inside your nose or mouth.  You have   a rash that: ? Looks like purple pinprick-sized spots all over your body. ? Has a "bull's eye" or looks like a target. ? Is red and painful, causes your skin to peel, and is not from being in the sun too long. Summary  A rash is a change in the color of your skin. A rash can also change the way your skin feels.  The goal of treatment is to stop the itching and keep the rash from spreading.  Take or apply over-the-counter and prescription medicines only as told by your doctor.  Contact a doctor if you have new symptoms or symptoms that get worse.  Keep all follow-up visits as told by your doctor. This is  important. This information is not intended to replace advice given to you by your health care provider. Make sure you discuss any questions you have with your health care provider. Document Revised: 09/24/2018 Document Reviewed: 01/04/2018 Elsevier Patient Education  2021 [employer] Inc.  

## 2020-10-10 NOTE — Progress Notes (Signed)
Established patient visit   Patient: [name]   DOB: November 15, 1950   70 y.o. Female  MRN: [id number] Visit Date: 10/10/2020  Today's healthcare provider: Laurita Quint Yailene Badia, FNP   Chief Complaint  Patient presents with  . Rash   Subjective    Rash This is a new problem. Episode onset: Started about three months ago. The problem has been gradually improving since onset. The affected locations include the abdomen. The rash is characterized by itchiness and redness (Pt reports this has improved.).    [name] on 4/20 Feels rash is much better since then Was prescribed Ketoconazole, Nystatin and fluconazole Still having issues with itching  Patient Active Problem List   Diagnosis Date Noted  . Rash 10/03/2020  . Intertriginous candidiasis 10/03/2020  . Hyperlipidemia associated with type 2 diabetes mellitus ([place]) 03/06/2020  . Special screening for malignant neoplasms, colon   . Benign neoplasm of cecum   . Bilateral bunions 10/12/2018  . Change in bowel habits 01/29/2018  . Rectal irritation 01/29/2018  . Postnasal drip 11/04/2017  . Other viral warts 11/04/2017  . Acute pain of both knees 08/07/2017  . Microalbuminuria due to type 2 diabetes mellitus ([place]) 03/26/2017  . Primary osteoarthritis involving multiple joints 12/05/2016  . Status post total left knee replacement 12/01/2016  . Primary osteoarthritis of left knee 09/07/2016  . Hypertension associated with diabetes ([place]) 07/24/2016  . Occipital headache 01/21/2016  . Bilateral occipital neuralgia 01/21/2016  . Chronic pain syndrome 05/07/2015  . Gasserian ganglionitis 03/12/2015  . Neuropathic pain 03/12/2015  . Herpes zoster ophthalmicus 03/12/2015  . Anxiety 01/11/2015  . Diabetes ([place]) 01/11/2015  . Photopsia 01/11/2015  . Avitaminosis A 01/11/2015  . Depression 01/10/2015  . Postherpetic neuralgia 11/22/2014  . Morbid obesity ([place]) 04/18/2014  . Neuropathic postherpetic trigeminal neuralgia  (Location of Primary Source of Pain) (Right) (V1) 12/21/2013  . Cannot sleep 03/13/2009  . Asthma 02/07/2009  . Calcium deficiency disease 10/16/2008  . Avitaminosis D 07/05/2008  . History of colon polyps 10/17/2005   Past Medical History:  Diagnosis Date  . Anxiety   . Arthritis   . Asthma   . Bronchitis 09/04/2016   urgent care  . Diabetes mellitus without complication ([place])   . Elevated C-reactive protein (CRP) 10/06/2016  . GERD (gastroesophageal reflux disease)   . History of shingles   . Hyperlipidemia   . Hypertension   . Neuralgia    Right side of head and face, s/p shingles  . Shingles    chronic on Rt side of head and face  . Wears dentures    partial upper   Social History   Tobacco Use  . Smoking status: Never Smoker  . Smokeless tobacco: Never Used  Vaping Use  . Vaping Use: Never used  Substance Use Topics  . Alcohol use: No    Alcohol/week: 0.0 standard drinks  . Drug use: No   Allergies  Allergen Reactions  . Lisinopril Cough  . Lovastatin Other (See Comments)    Myalgia  . Shellfish Allergy Rash     Medications: Outpatient Medications Prior to Visit  Medication Sig  . albuterol (VENTOLIN HFA) 108 (90 Base) MCG/ACT inhaler Inhale 1-2 puffs into the lungs every 6 (six) hours as needed for wheezing or shortness of breath.  Marland Kitchen aspirin EC 81 MG tablet Take 81 mg by mouth daily.  Marland Kitchen atorvastatin (LIPITOR) 80 MG tablet Take 1 tablet (80 mg total) by mouth daily.  Marland Kitchen  carboxymethylcellulose (REFRESH PLUS) 0.5 % SOLN Apply 2 drops to eye 2 (two) times daily as needed.  . fluticasone furoate-vilanterol (BREO ELLIPTA) 100-25 MCG/INH AEPB Inhale 1 puff into the lungs 2 (two) times daily.  Marland Kitchen gabapentin (NEURONTIN) 600 MG tablet Take 2 tablets (1,200 mg total) by mouth 3 (three) times daily.  . hydrochlorothiazide (HYDRODIURIL) 12.5 MG tablet Take 1 tablet (12.5 mg total) by mouth daily.  . Insulin Pen Needle (B-D UF III MINI PEN NEEDLES) 31G X 5 MM MISC Use as  directed  . ketoconazole (NIZORAL) 2 % cream Apply 1 application topically daily.  Marland Kitchen losartan (COZAAR) 100 MG tablet Take 1 tablet (100 mg total) by mouth daily.  Marland Kitchen nystatin (MYCOSTATIN/NYSTOP) powder Apply 1 application topically 3 (three) times daily.  Marland Kitchen omeprazole (PRILOSEC) 20 MG capsule Take 1 capsule (20 mg total) by mouth daily.  . Semaglutide, 1 MG/DOSE, (OZEMPIC, 1 MG/DOSE,) 2 MG/1.5ML SOPN Inject 1 mg into the skin once a week.  Marland Kitchen VITAMIN D PO Take by mouth daily.  . fluconazole (DIFLUCAN) 150 MG tablet Take 1 tablet (150 mg total) by mouth as directed. Take one tablet by mouth on day 1. May repeat dose of one tablet by mouth on day five. (Patient not taking: Reported on 10/10/2020)  . hydrocortisone (ANUSOL-HC) 2.5 % rectal cream Place 1 application rectally 2 (two) times daily. (Patient not taking: Reported on 10/10/2020)  . pregabalin (LYRICA) 300 MG capsule Take 1 capsule (300 mg total) by mouth 2 (two) times daily.   No facility-administered medications prior to visit.    Review of Systems  Constitutional: Negative.   Respiratory: Negative.   Cardiovascular: Negative.   Skin: Positive for rash.        Objective    BP 134/84 (BP Location: Left Arm, Patient Position: Sitting, Cuff Size: Large)   Pulse 83   Temp 97.9 F (36.6 C) (Oral)   Wt 226 lb (102.5 kg)   BMI 42.70 kg/m    Physical Exam Constitutional:      General: She is not in acute distress.    Appearance: Normal appearance. She is not ill-appearing.  HENT:     Head: Normocephalic.  Cardiovascular:     Rate and Rhythm: Normal rate and regular rhythm.     Pulses: Normal pulses.     Heart sounds: Normal heart sounds. No murmur heard. No friction rub. No gallop.   Pulmonary:     Effort: Pulmonary effort is normal. No respiratory distress.     Breath sounds: Normal breath sounds. No stridor. No wheezing, rhonchi or rales.  Abdominal:     General: Bowel sounds are normal.     Palpations: Abdomen is  soft.     Tenderness: There is no abdominal tenderness.  Musculoskeletal:     Right lower leg: No edema.     Left lower leg: No edema.  Skin:    General: Skin is warm and dry.     Findings: Rash (Under abdomen, pigmentation darkened noted) present.  Neurological:     Mental Status: She is alert and oriented to person, place, and time.  Psychiatric:        Mood and Affect: Mood normal.        Behavior: Behavior normal.       No results found for any visits on 10/10/20.  Assessment & Plan     Problem List Items Addressed This Visit   None   Visit Diagnoses    Itching    -  Primary   Relevant Medications   triamcinolone ointment (KENALOG) 0.5 %     Continue current regimen Adding steroid cream for itching, r/se/b discussed  Return in about 3 months (around 01/09/2021).         [place], [place] [phone] (phone) [phone] (fax)  [place]

## 2020-11-05 ENCOUNTER — Telehealth: Payer: Self-pay

## 2020-11-05 NOTE — Telephone Encounter (Signed)
Please advise 

## 2020-11-05 NOTE — Telephone Encounter (Signed)
Copied from [place] [id number]. Topic: General - Inquiry >> Nov 05, 2020 12:56 PM Greggory Keen D wrote: Reason for CRM: Pt called saying she has been coughing for  2 weeks.  She did a Covid test Friday and it was negative.  She has an appt tomorrow for a three month fu with Dr. B.  Please advise if she can come in and if not what she can do regarding her cough.  CB@  [phone]

## 2020-11-05 NOTE — Telephone Encounter (Signed)
Ok to be seen in person

## 2020-11-06 ENCOUNTER — Encounter: Payer: Self-pay | Admitting: Family Medicine

## 2020-11-06 ENCOUNTER — Ambulatory Visit (INDEPENDENT_AMBULATORY_CARE_PROVIDER_SITE_OTHER): Payer: Medicare Other | Admitting: Family Medicine

## 2020-11-06 ENCOUNTER — Other Ambulatory Visit: Payer: Self-pay

## 2020-11-06 VITALS — BP 140/86 | HR 90 | Temp 98.3°F | Resp 16 | Ht 62.0 in | Wt 224.0 lb

## 2020-11-06 DIAGNOSIS — E785 Hyperlipidemia, unspecified: Secondary | ICD-10-CM

## 2020-11-06 DIAGNOSIS — R809 Proteinuria, unspecified: Secondary | ICD-10-CM | POA: Diagnosis not present

## 2020-11-06 DIAGNOSIS — J4541 Moderate persistent asthma with (acute) exacerbation: Secondary | ICD-10-CM

## 2020-11-06 DIAGNOSIS — E1169 Type 2 diabetes mellitus with other specified complication: Secondary | ICD-10-CM

## 2020-11-06 DIAGNOSIS — E1129 Type 2 diabetes mellitus with other diabetic kidney complication: Secondary | ICD-10-CM | POA: Diagnosis not present

## 2020-11-06 DIAGNOSIS — E1159 Type 2 diabetes mellitus with other circulatory complications: Secondary | ICD-10-CM

## 2020-11-06 DIAGNOSIS — I152 Hypertension secondary to endocrine disorders: Secondary | ICD-10-CM | POA: Diagnosis not present

## 2020-11-06 LAB — POCT GLYCOSYLATED HEMOGLOBIN (HGB A1C)
Est. average glucose Bld gHb Est-mCnc: 117
Hemoglobin A1C: 5.7 % — AB (ref 4.0–5.6)

## 2020-11-06 MED ORDER — PREDNISONE 20 MG PO TABS: 40.0000 mg | ORAL_TABLET | Freq: Every day | ORAL | 0 refills | Status: AC

## 2020-11-06 MED ORDER — OZEMPIC (0.25 OR 0.5 MG/DOSE) 2 MG/1.5ML ~~LOC~~ SOPN: 0.5000 mg | PEN_INJECTOR | SUBCUTANEOUS | 3 refills | Status: DC

## 2020-11-06 MED ORDER — ALBUTEROL SULFATE HFA 108 (90 BASE) MCG/ACT IN AERS: 1.0000 | INHALATION_SPRAY | Freq: Four times a day (QID) | RESPIRATORY_TRACT | 0 refills | Status: DC | PRN

## 2020-11-06 NOTE — Assessment & Plan Note (Signed)
Continue losartan. 

## 2020-11-06 NOTE — Assessment & Plan Note (Signed)
Discussed importance of healthy weight management Discussed diet and exercise  

## 2020-11-06 NOTE — Assessment & Plan Note (Addendum)
Previously Well controlled, but hasn't taken her medications in a few days related to illness Continue current medications Reviewed recent metabolic panel F/u in 6 months

## 2020-11-06 NOTE — Assessment & Plan Note (Addendum)
Well controlled with A1c 5.7 Not tolerating higher dose of Ozempic - decrease back to 0.5mg  weekly UTD on vaccines, eye exam, foot exam On ARB On Statin Discussed diet and exercise F/u in 6 months

## 2020-11-06 NOTE — Progress Notes (Signed)
Established patient visit   Patient: [name]   DOB: 1950-11-05   70 y.o. Female  MRN: [id number] Visit Date: 11/06/2020  Today's healthcare provider: Lavon Paganini, MD   Chief Complaint  Patient presents with  . Diabetes  . Hypertension  . Cough   Subjective    Diabetes Pertinent negatives for hypoglycemia include no dizziness, headaches or seizures. Associated symptoms include fatigue. Pertinent negatives for diabetes include no chest pain and no weakness.  Hypertension Associated symptoms include shortness of breath. Pertinent negatives include no chest pain, headaches, neck pain or palpitations.  Cough Associated symptoms include ear pain, shortness of breath and wheezing. Pertinent negatives include no chest pain, chills, fever, headaches, postnasal drip, rhinorrhea or sore throat.   Diabetes Mellitus Type II, Follow-up  Lab Results  Component Value Date   HGBA1C 5.7 (A) 11/06/2020   HGBA1C 6.7 (H) 08/06/2020   HGBA1C 6.6 (H) 03/13/2020   Wt Readings from Last 3 Encounters:  11/06/20 224 lb (101.6 kg)  10/10/20 226 lb (102.5 kg)  10/03/20 230 lb 12.8 oz (104.7 kg)   Last seen for diabetes 2 months ago.  Management since then includes increase Ozempic. She's been having upset stomach on the 1mg  of Ozempic and she is requesting to return back to .5mg  of Ozempic. She reports good compliance with treatment. She is not having side effects. Symptoms: Yes fatigue No foot ulcerations  No appetite changes No nausea  No paresthesia of the feet  No polydipsia  No polyuria No visual disturbances   No vomiting     Home blood sugar records: not being checked  Episodes of hypoglycemia? No   Current insulin regiment: none Most Recent Eye Exam: UTD Current exercise: none Current diet habits: in general, a "healthy" diet    Pertinent Labs: Lab Results  Component Value Date   CHOL 137 03/13/2020   HDL 40 03/13/2020   LDLCALC 77 03/13/2020   TRIG 109  03/13/2020   CHOLHDL 3.4 03/13/2020   Lab Results  Component Value Date   NA 140 10/03/2020   K 3.7 10/03/2020   CREATININE 1.02 (H) 10/03/2020   GFRNONAA 53 (L) 08/06/2020   GFRAA 61 08/06/2020   GLUCOSE 85 10/03/2020     --------------------------------------------------------------------------------------------------- Hypertension, follow-up  BP Readings from Last 3 Encounters:  11/06/20 140/86  10/10/20 134/84  10/03/20 130/80   Wt Readings from Last 3 Encounters:  11/06/20 224 lb (101.6 kg)  10/10/20 226 lb (102.5 kg)  10/03/20 230 lb 12.8 oz (104.7 kg)     She was last seen for hypertension 2 months ago.  BP at that visit was 123/79. Management since that visit includes no changes.  She reports excellent compliance with treatment. She is not having side effects. She is following a Regular diet. She is not exercising. She does not smoke.  Use of agents associated with hypertension: none.  She denies checking her blood pressure at home. She has not taken her medication for 3 days because she hasn't felt well.  Outside blood pressures are stable. Symptoms: No chest pain No chest pressure  No palpitations No syncope  No dyspnea No orthopnea  No paroxysmal nocturnal dyspnea No lower extremity edema   Pertinent labs: Lab Results  Component Value Date   CHOL 137 03/13/2020   HDL 40 03/13/2020   LDLCALC 77 03/13/2020   TRIG 109 03/13/2020   CHOLHDL 3.4 03/13/2020   Lab Results  Component Value Date   NA 140  10/03/2020   K 3.7 10/03/2020   CREATININE 1.02 (H) 10/03/2020   GFRNONAA 53 (L) 08/06/2020   GFRAA 61 08/06/2020   GLUCOSE 85 10/03/2020     The 10-year ASCVD risk score Mikey Bussing DC Jr., et al., 2013) is: 22.5%   --------------------------------------------------------------------------------------------------- Upper respiratory symptoms She complains of congestion and cough described as nonproductive.with no fever, chills, night sweats or weight loss.  Onset of symptoms was a few weeks ago and worsening.She is drinking plenty of fluids.  Past history is significant for asthma. Patient is non-smoker  ---------------------------------------------------------------------------------------------------    Patient Active Problem List   Diagnosis Date Noted  . Intertriginous candidiasis 10/03/2020  . Hyperlipidemia associated with type 2 diabetes mellitus ([place]) 03/06/2020  . Special screening for malignant neoplasms, colon   . Benign neoplasm of cecum   . Bilateral bunions 10/12/2018  . Change in bowel habits 01/29/2018  . Rectal irritation 01/29/2018  . Postnasal drip 11/04/2017  . Other viral warts 11/04/2017  . Acute pain of both knees 08/07/2017  . Microalbuminuria due to type 2 diabetes mellitus ([place]) 03/26/2017  . Primary osteoarthritis involving multiple joints 12/05/2016  . Status post total left knee replacement 12/01/2016  . Primary osteoarthritis of left knee 09/07/2016  . Hypertension associated with diabetes ([place]) 07/24/2016  . Occipital headache 01/21/2016  . Bilateral occipital neuralgia 01/21/2016  . Chronic pain syndrome 05/07/2015  . Gasserian ganglionitis 03/12/2015  . Neuropathic pain 03/12/2015  . Herpes zoster ophthalmicus 03/12/2015  . Anxiety 01/11/2015  . Diabetes ([place]) 01/11/2015  . Photopsia 01/11/2015  . Avitaminosis A 01/11/2015  . Depression 01/10/2015  . Postherpetic neuralgia 11/22/2014  . Morbid obesity ([place]) 04/18/2014  . Neuropathic postherpetic trigeminal neuralgia (Location of Primary Source of Pain) (Right) (V1) 12/21/2013  . Cannot sleep 03/13/2009  . Asthma 02/07/2009  . Calcium deficiency disease 10/16/2008  . Avitaminosis D 07/05/2008  . History of colon polyps 10/17/2005   Social History   Tobacco Use  . Smoking status: Never Smoker  . Smokeless tobacco: Never Used  Vaping Use  . Vaping Use: Never used  Substance Use Topics  . Alcohol use: No    Alcohol/week: 0.0 standard  drinks  . Drug use: No   Allergies  Allergen Reactions  . Lisinopril Cough  . Lovastatin Other (See Comments)    Myalgia  . Shellfish Allergy Rash       Medications: Outpatient Medications Prior to Visit  Medication Sig  . aspirin EC 81 MG tablet Take 81 mg by mouth daily.  [place] Kitchen atorvastatin (LIPITOR) 80 MG tablet Take 1 tablet (80 mg total) by mouth daily.  . carboxymethylcellulose (REFRESH PLUS) 0.5 % SOLN Apply 2 drops to eye 2 (two) times daily as needed.  . fluconazole (DIFLUCAN) 150 MG tablet Take 1 tablet (150 mg total) by mouth as directed. Take one tablet by mouth on day 1. May repeat dose of one tablet by mouth on day five.  . fluticasone furoate-vilanterol (BREO ELLIPTA) 100-25 MCG/INH AEPB Inhale 1 puff into the lungs 2 (two) times daily.  [place] Kitchen gabapentin (NEURONTIN) 600 MG tablet Take 2 tablets (1,200 mg total) by mouth 3 (three) times daily.  . hydrochlorothiazide (HYDRODIURIL) 12.5 MG tablet Take 1 tablet (12.5 mg total) by mouth daily.  . hydrocortisone (ANUSOL-HC) 2.5 % rectal cream Place 1 application rectally 2 (two) times daily.  . Insulin Pen Needle (B-D UF III MINI PEN NEEDLES) 31G X 5 MM MISC Use as directed  . ketoconazole (NIZORAL) 2 % cream Apply  1 application topically daily.  [place] Kitchen losartan (COZAAR) 100 MG tablet Take 1 tablet (100 mg total) by mouth daily.  [place] Kitchen nystatin (MYCOSTATIN/NYSTOP) powder Apply 1 application topically 3 (three) times daily.  [place] Kitchen omeprazole (PRILOSEC) 20 MG capsule Take 1 capsule (20 mg total) by mouth daily.  [place] Kitchen triamcinolone ointment (KENALOG) 0.5 % Apply 1 application topically 2 (two) times daily.  [place] Kitchen VITAMIN D PO Take by mouth daily.  . [DISCONTINUED] albuterol (VENTOLIN HFA) 108 (90 Base) MCG/ACT inhaler Inhale 1-2 puffs into the lungs every 6 (six) hours as needed for wheezing or shortness of breath.  . [DISCONTINUED] Semaglutide, 1 MG/DOSE, (OZEMPIC, 1 MG/DOSE,) 2 MG/1.5ML SOPN Inject 1 mg into the skin once a week.  . pregabalin (LYRICA)  300 MG capsule Take 1 capsule (300 mg total) by mouth 2 (two) times daily.   No facility-administered medications prior to visit.    Review of Systems  Constitutional: Positive for appetite change and fatigue. Negative for chills and fever.  HENT: Positive for congestion and ear pain. Negative for postnasal drip, rhinorrhea, sinus pressure, sinus pain and sore throat.   Respiratory: Positive for cough, shortness of breath and wheezing. Negative for chest tightness.   Cardiovascular: Negative for chest pain, palpitations and leg swelling.  Gastrointestinal: Negative for abdominal distention, abdominal pain, blood in stool, diarrhea, nausea and vomiting.  Genitourinary: Negative for flank pain, frequency, pelvic pain and urgency.  Musculoskeletal: Negative for back pain, neck pain and neck stiffness.  Neurological: Negative for dizziness, seizures, syncope, weakness, light-headedness, numbness and headaches.       Objective    BP 140/86 (BP Location: Right Arm, Patient Position: Sitting, Cuff Size: Large)   Pulse 90   Temp 98.3 F (36.8 C) (Oral)   Resp 16   Ht 5\' 2"  (1.575 m)   Wt 224 lb (101.6 kg)   SpO2 97%   BMI 40.97 kg/m  BP Readings from Last 3 Encounters:  11/06/20 140/86  10/10/20 134/84  10/03/20 130/80   Wt Readings from Last 3 Encounters:  11/06/20 224 lb (101.6 kg)  10/10/20 226 lb (102.5 kg)  10/03/20 230 lb 12.8 oz (104.7 kg)       Physical Exam Vitals reviewed.  Constitutional:      General: She is not in acute distress.    Appearance: Normal appearance. She is well-developed. She is not diaphoretic.  HENT:     Head: Normocephalic and atraumatic.  Eyes:     General: No scleral icterus.    Conjunctiva/sclera: Conjunctivae normal.  Neck:     Thyroid: No thyromegaly.  Cardiovascular:     Rate and Rhythm: Normal rate and regular rhythm.     Pulses: Normal pulses.     Heart sounds: Normal heart sounds. No murmur heard.   Pulmonary:     Effort:  Pulmonary effort is normal. No respiratory distress.     Breath sounds: Wheezing present. No rhonchi or rales.  Musculoskeletal:     Cervical back: Neck supple.     Right lower leg: No edema.     Left lower leg: No edema.  Lymphadenopathy:     Cervical: No cervical adenopathy.  Skin:    General: Skin is warm and dry.     Findings: No rash.  Neurological:     Mental Status: She is alert and oriented to person, place, and time. Mental status is at baseline.  Psychiatric:        Mood and Affect: Mood normal.  Behavior: Behavior normal.       Results for orders placed or performed in visit on 11/06/20  POCT glycosylated hemoglobin (Hb A1C)  Result Value Ref Range   Hemoglobin A1C 5.7 (A) 4.0 - 5.6 %   Est. average glucose Bld gHb Est-mCnc 117     Assessment & Plan     Problem List Items Addressed This Visit      Cardiovascular and Mediastinum   Hypertension associated with diabetes ([place])    Previously Well controlled, but hasn't taken her medications in a few days related to illness Continue current medications Reviewed recent metabolic panel F/u in 6 months       Relevant Medications   Semaglutide,0.25 or 0.5MG /DOS, (OZEMPIC, 0.25 OR 0.5 MG/DOSE,) 2 MG/1.5ML SOPN     Respiratory   Asthma    With current exacerbation Treat with albuterol prn Continue breo Prednisone 40mg  daily x7d Return precautions discussed      Relevant Medications   predniSONE (DELTASONE) 20 MG tablet   albuterol (VENTOLIN HFA) 108 (90 Base) MCG/ACT inhaler     Endocrine   Diabetes ([place]) - Primary    Well controlled with A1c 5.7 Not tolerating higher dose of Ozempic - decrease back to 0.5mg  weekly UTD on vaccines, eye exam, foot exam On ARB On Statin Discussed diet and exercise F/u in 6 months       Relevant Medications   Semaglutide,0.25 or 0.5MG /DOS, (OZEMPIC, 0.25 OR 0.5 MG/DOSE,) 2 MG/1.5ML SOPN   Other Relevant Orders   POCT glycosylated hemoglobin (Hb A1C) (Completed)    Microalbuminuria due to type 2 diabetes mellitus (H[facility])    Continue losartan      Relevant Medications   Semaglutide,0.25 or 0.5MG /DOS, (OZEMPIC, 0.25 OR 0.5 MG/DOSE,) 2 MG/1.5ML SOPN   Hyperlipidemia associated with type 2 diabetes mellitus (H[facility])    Previously well controlled Continue statin Repeat FLP and CMP at next visit Goal LDL < 70      Relevant Medications   Semaglutide,0.25 or 0.5MG /DOS, (OZEMPIC, 0.25 OR 0.5 MG/DOSE,) 2 MG/1.5ML SOPN     Other   Morbid obesity ([place])    Discussed importance of healthy weight management Discussed diet and exercise       Relevant Medications   Semaglutide,0.25 or 0.5MG /DOS, (OZEMPIC, 0.25 OR 0.5 MG/DOSE,) 2 MG/1.5ML SOPN       Return in about 6 months (around 05/09/2021) for chronic disease f/u.       I,Essence Turner,acting as a Education administrator for Lavon Paganini, MD.,have documented all relevant documentation on the behalf of Lavon Paganini, MD,as directed by  Lavon Paganini, MD while in the presence of Lavon Paganini, MD.  I, Lavon Paganini, MD, have reviewed all documentation for this visit. The documentation on 11/06/20 for the exam, diagnosis, procedures, and orders are all accurate and complete.   Ceil Roderick, Dionne Bucy, MD, MPH [facility]

## 2020-11-06 NOTE — Assessment & Plan Note (Signed)
Previously well controlled Continue statin Repeat FLP and CMP at next visit Goal LDL < 70  

## 2020-11-06 NOTE — Assessment & Plan Note (Signed)
With current exacerbation Treat with albuterol prn Continue breo Prednisone 40mg  daily x7d Return precautions discussed

## 2020-11-28 ENCOUNTER — Other Ambulatory Visit: Payer: Self-pay | Admitting: Family Medicine

## 2020-11-28 NOTE — Telephone Encounter (Signed)
Requested Prescriptions  Pending Prescriptions Disp Refills  . albuterol (VENTOLIN HFA) 108 (90 Base) MCG/ACT inhaler [Pharmacy Med Name: ALBUTEROL HFA (VENTOLIN) INH] 18 each 2    Sig: INHALE 1-2 PUFFS BY MOUTH EVERY 6 HOURS AS NEEDED FOR WHEEZE OR SHORTNESS OF BREATH     Pulmonology:  Beta Agonists Failed - 11/28/2020  1:24 AM      Failed - One inhaler should last at least one month. If the patient is requesting refills earlier, contact the patient to check for uncontrolled symptoms.      Passed - Valid encounter within last 12 months    Recent Outpatient Visits          3 weeks ago Type 2 diabetes mellitus with other specified complication, without long-term current use of insulin [facility])   [facility] [place], Dionne Bucy, MD   1 month ago [facility], Laurita Quint, FNP   1 month ago Rash   [facility], Kelby Aline, FNP   2 months ago Type 2 diabetes mellitus with other specified complication, without long-term current use of insulin [facility])   [facility], Dionne Bucy, MD   3 months ago Type 2 diabetes mellitus with other specified complication, without long-term current use of insulin [facility])   [place], Dionne Bucy, MD      Future Appointments            In 5 months Bacigalupo, Dionne Bucy, MD [facility], [facility]

## 2021-01-17 DIAGNOSIS — Z96653 Presence of artificial knee joint, bilateral: Secondary | ICD-10-CM | POA: Diagnosis not present

## 2021-02-06 ENCOUNTER — Telehealth: Payer: Self-pay | Admitting: Family Medicine

## 2021-02-06 NOTE — Telephone Encounter (Signed)
Pt called and stated that she spoke with the [place] and medication Semaglutide,0.25 or 0.'5MG'$ /DOS, (OZEMPIC, 0.25 OR 0.5 MG/DOSE,) 2 MG/1.5ML SOPN [id number]   Has been canceled. Pt would like a call back from a nurse to discuss. Please advise

## 2021-02-07 NOTE — Telephone Encounter (Signed)
NA, voicemail is full.

## 2021-02-08 NOTE — Telephone Encounter (Signed)
NA, mailbox is full.

## 2021-02-12 MED ORDER — OZEMPIC (0.25 OR 0.5 MG/DOSE) 2 MG/1.5ML ~~LOC~~ SOPN: 0.5000 mg | PEN_INJECTOR | SUBCUTANEOUS | 3 refills | Status: DC

## 2021-04-02 ENCOUNTER — Other Ambulatory Visit: Payer: Self-pay | Admitting: Family Medicine

## 2021-04-02 ENCOUNTER — Encounter: Payer: Self-pay | Admitting: General Surgery

## 2021-04-02 DIAGNOSIS — Z1231 Encounter for screening mammogram for malignant neoplasm of breast: Secondary | ICD-10-CM

## 2021-04-02 DIAGNOSIS — E119 Type 2 diabetes mellitus without complications: Secondary | ICD-10-CM | POA: Diagnosis not present

## 2021-04-02 LAB — HM DIABETES EYE EXAM

## 2021-04-22 ENCOUNTER — Other Ambulatory Visit: Payer: Self-pay | Admitting: Family Medicine

## 2021-04-22 NOTE — Telephone Encounter (Signed)
Copied from [place] [phone]. Topic: Quick Communication - Rx Refill/Question >> Apr 22, 2021  1:26 PM Leward Quan A wrote: Medication: Semaglutide,0.25 or 0.5MG /DOS, (OZEMPIC, 0.25 OR 0.5 MG/DOSE,) 2 MG/1.5ML SOPN  Need Rx for 90 days for insurance purposes  Has the patient contacted their pharmacy? Yes.  Need new Rx  (Agent: If no, request that the patient contact the pharmacy for the refill. If patient does not wish to contact the pharmacy document the reason why and proceed with request.) (Agent: If yes, when and what did the pharmacy advise?)  Preferred Pharmacy (with phone number or street name): [facility], [place]  Phone:  [phone] Fax:  [phone]    Has the patient been seen for an appointment in the last year OR does the patient have an upcoming appointment? Yes.    Agent: Please be advised that RX refills may take up to 3 business days. We ask that you follow-up with your pharmacy.

## 2021-04-22 NOTE — Telephone Encounter (Signed)
Requested medication (s) are due for refill today: yes  Requested medication (s) are on the active medication list: yes  Last refill: 02/12/21  4.5   3 refills  Future visit scheduled yes    Notes to clinic:  Per protocol, unable to give 90 day as requested   Requested Prescriptions  Pending Prescriptions Disp Refills   Semaglutide,0.25 or 0.5MG /DOS, (OZEMPIC, 0.25 OR 0.5 MG/DOSE,) 2 MG/1.5ML SOPN 4.5 mL 3    Sig: Inject 0.5 mg into the skin once a week.     Endocrinology:  Diabetes - GLP-1 Receptor Agonists Passed - 04/22/2021  2:04 PM      Passed - HBA1C is between 0 and 7.9 and within 180 days    Hemoglobin A1C  Date Value Ref Range Status  11/06/2020 5.7 (A) 4.0 - 5.6 % Final   Hgb A1c MFr Bld  Date Value Ref Range Status  08/06/2020 6.7 (H) 4.8 - 5.6 % Final    Comment:             Prediabetes: 5.7 - 6.4          Diabetes: >6.4          Glycemic control for adults with diabetes: <7.0           Passed - Valid encounter within last 6 months    Recent Outpatient Visits           5 months ago Type 2 diabetes mellitus with other specified complication, without long-term current use of insulin ([place])   [facility] [place], Dionne Bucy, MD   6 months ago [place], Laurita Quint, FNP   6 months ago Rash   [facility], Kelby Aline, FNP   7 months ago Type 2 diabetes mellitus with other specified complication, without long-term current use of insulin [facility])   [facility] [place], Dionne Bucy, MD   8 months ago Type 2 diabetes mellitus with other specified complication, without long-term current use of insulin [facility])   [place], Dionne Bucy, MD       Future Appointments             In 3 weeks Bacigalupo, Dionne Bucy, MD [facility], [facility]

## 2021-04-30 DIAGNOSIS — B0222 Postherpetic trigeminal neuralgia: Secondary | ICD-10-CM | POA: Diagnosis not present

## 2021-05-07 ENCOUNTER — Ambulatory Visit
Admission: RE | Admit: 2021-05-07 | Discharge: 2021-05-07 | Disposition: A | Discharge status: Home / Self Care | Payer: Medicare Other | Source: Ambulatory Visit | Attending: Family Medicine | Admitting: Family Medicine

## 2021-05-07 ENCOUNTER — Other Ambulatory Visit: Payer: Self-pay

## 2021-05-07 DIAGNOSIS — Z1231 Encounter for screening mammogram for malignant neoplasm of breast: Secondary | ICD-10-CM | POA: Diagnosis not present

## 2021-05-15 NOTE — Progress Notes (Signed)
Established patient visit   Patient: [name]   DOB: November 23, 1950   70 y.o. Female  MRN: [id number] Visit Date: 05/16/2021  Today's healthcare provider: Lavon Paganini, MD   Chief Complaint  Patient presents with   Follow-up   Subjective    HPI  Diabetes Mellitus Type II, follow-up Lab Results  Component Value Date   HGBA1C 6.0 (A) 05/16/2021    Last HGBA1C on 11/06/20 was 5.7  Last seen for diabetes 6 months ago.  Management since then includes Not tolerating higher dose of Ozempic - decrease back to 0.56m weekly She reports excellent compliance with treatment. She is not having side effects.   Home blood sugar records: not being checked  Episodes of hypoglycemia? No   Most Recent Eye Exam: UTD  --------------------------------------------------------------------------------------------------- Hypertension, follow-up BP Readings from Last 3 Encounters:  05/16/21 130/72  11/06/20 140/86  10/10/20 134/84    Wt Readings from Last 3 Encounters:  05/16/21 214 lb 11.2 oz (97.4 kg)  11/06/20 224 lb (101.6 kg)  10/10/20 226 lb (102.5 kg)     She was last seen for hypertension 6 months ago.  BP at that visit was 140/86. Management since that visit includes continue current treatment. She reports excellent compliance with treatment. She is not having side effects.  She is exercising. She is adherent to low salt diet.   Outside blood pressures are not being checked.  She does not smoke.  Lipid/Cholesterol, follow-up  Last Lipid Panel: Lab Results  Component Value Date   CHOL 137 03/13/2020   LDLCALC 77 03/13/2020   HDL 40 03/13/2020   TRIG 109 03/13/2020    She was last seen for this 6 months ago.  Management since that visit includes Continue statin.  She reports excellent compliance with treatment. She is not having side effect.  Symptoms: No appetite changes No foot ulcerations  No chest pain No chest pressure/discomfort  No dyspnea  No orthopnea  No fatigue No lower extremity edema  No palpitations No paroxysmal nocturnal dyspnea  No nausea Yes numbness or tingling of extremity  No polydipsia No polyuria  No speech difficulty No syncope   She is following a Regular diet. Current exercise: walking  Last metabolic panel Lab Results  Component Value Date   GLUCOSE 85 10/03/2020   NA 140 10/03/2020   K 3.7 10/03/2020   BUN 13 10/03/2020   CREATININE 1.02 (H) 10/03/2020   EGFR 59 (L) 10/03/2020   GFRNONAA 53 (L) 08/06/2020   CALCIUM 9.0 10/03/2020   AST 12 10/03/2020   ALT 11 10/03/2020   The 10-year ASCVD risk score (Arnett DK, et al., 2019) is: 19%  ---------------------------------------------------------------------------------------------------   Medications: Outpatient Medications Prior to Visit  Medication Sig   albuterol (VENTOLIN HFA) 108 (90 Base) MCG/ACT inhaler INHALE 1-2 PUFFS BY MOUTH EVERY 6 HOURS AS NEEDED FOR WHEEZE OR SHORTNESS OF BREATH   aspirin EC 81 MG tablet Take 81 mg by mouth daily.   atorvastatin (LIPITOR) 80 MG tablet Take 1 tablet (80 mg total) by mouth daily.   carboxymethylcellulose (REFRESH PLUS) 0.5 % SOLN Apply 2 drops to eye 2 (two) times daily as needed.   fluconazole (DIFLUCAN) 150 MG tablet Take 1 tablet (150 mg total) by mouth as directed. Take one tablet by mouth on day 1. May repeat dose of one tablet by mouth on day five.   fluticasone furoate-vilanterol (BREO ELLIPTA) 100-25 MCG/INH AEPB Inhale 1 puff into the lungs 2 (two)  times daily.   gabapentin (NEURONTIN) 600 MG tablet Take 2 tablets (1,200 mg total) by mouth 3 (three) times daily.   hydrochlorothiazide (HYDRODIURIL) 12.5 MG tablet Take 1 tablet (12.5 mg total) by mouth daily.   hydrocortisone (ANUSOL-HC) 2.5 % rectal cream Place 1 application rectally 2 (two) times daily.   Insulin Pen Needle (B-D UF III MINI PEN NEEDLES) 31G X 5 MM MISC Use as directed   ketoconazole (NIZORAL) 2 % cream Apply 1 application  topically daily.   losartan (COZAAR) 100 MG tablet Take 1 tablet (100 mg total) by mouth daily.   nystatin (MYCOSTATIN/NYSTOP) powder Apply 1 application topically 3 (three) times daily.   omeprazole (PRILOSEC) 20 MG capsule Take 1 capsule (20 mg total) by mouth daily.   triamcinolone ointment (KENALOG) 0.5 % Apply 1 application topically 2 (two) times daily.   VITAMIN D PO Take by mouth daily.   [DISCONTINUED] Semaglutide,0.25 or 0.5MG/DOS, (OZEMPIC, 0.25 OR 0.5 MG/DOSE,) 2 MG/1.5ML SOPN Inject 0.5 mg into the skin once a week.   pregabalin (LYRICA) 300 MG capsule Take 1 capsule (300 mg total) by mouth 2 (two) times daily.   No facility-administered medications prior to visit.    Review of Systems  Constitutional:  Negative for appetite change, chills, fatigue and fever.  Respiratory:  Negative for chest tightness and shortness of breath.   Cardiovascular:  Negative for chest pain and palpitations.  Gastrointestinal:  Negative for abdominal pain, nausea and vomiting.  Neurological:  Negative for dizziness and weakness.      Objective    BP 130/72 Comment: home reading  Pulse 81   Resp 16   Ht 5' 3" (1.6 m)   Wt 214 lb 11.2 oz (97.4 kg)   SpO2 100%   BMI 38.03 kg/m  {Show previous vital signs (optional):23777}  Physical Exam Vitals reviewed.  Constitutional:      General: She is not in acute distress.    Appearance: Normal appearance. She is well-developed. She is not diaphoretic.  HENT:     Head: Normocephalic and atraumatic.  Eyes:     General: No scleral icterus.    Conjunctiva/sclera: Conjunctivae normal.  Neck:     Thyroid: No thyromegaly.  Cardiovascular:     Rate and Rhythm: Normal rate and regular rhythm.     Pulses: Normal pulses.     Heart sounds: Normal heart sounds. No murmur heard. Pulmonary:     Effort: Pulmonary effort is normal. No respiratory distress.     Breath sounds: Normal breath sounds. No wheezing, rhonchi or rales.  Musculoskeletal:      Cervical back: Neck supple.     Right lower leg: No edema.     Left lower leg: No edema.  Lymphadenopathy:     Cervical: No cervical adenopathy.  Skin:    General: Skin is warm and dry.     Findings: No rash.  Neurological:     Mental Status: She is alert and oriented to person, place, and time. Mental status is at baseline.  Psychiatric:        Mood and Affect: Mood normal.        Behavior: Behavior normal.      Results for orders placed or performed in visit on 05/16/21  Comprehensive metabolic panel  Result Value Ref Range   Glucose 112 (H) 70 - 99 mg/dL   BUN 9 8 - 27 mg/dL   Creatinine, Ser 0.97 0.57 - 1.00 mg/dL   eGFR 62 >59 mL/min/1.73  BUN/Creatinine Ratio 9 (L) 12 - 28   Sodium 140 134 - 144 mmol/L   Potassium 4.3 3.5 - 5.2 mmol/L   Chloride 103 96 - 106 mmol/L   CO2 23 20 - 29 mmol/L   Calcium 9.6 8.7 - 10.3 mg/dL   Total Protein 6.8 6.0 - 8.5 g/dL   Albumin 4.2 3.7 - 4.7 g/dL   Globulin, Total 2.6 1.5 - 4.5 g/dL   Albumin/Globulin Ratio 1.6 1.2 - 2.2   Bilirubin Total 0.4 0.0 - 1.2 mg/dL   Alkaline Phosphatase 93 44 - 121 IU/L   AST 14 0 - 40 IU/L   ALT 11 0 - 32 IU/L  Lipid panel  Result Value Ref Range   Cholesterol, Total 137 100 - 199 mg/dL   Triglycerides 112 0 - 149 mg/dL   HDL 41 >39 mg/dL   VLDL Cholesterol Cal 20 5 - 40 mg/dL   LDL Chol Calc (NIH) 76 0 - 99 mg/dL   Chol/HDL Ratio 3.3 0.0 - 4.4 ratio  POCT glycosylated hemoglobin (Hb A1C)  Result Value Ref Range   Hemoglobin A1C 6.0 (A) 4.0 - 5.6 %   Est. average glucose Bld gHb Est-mCnc 126     Assessment & Plan     Problem List Items Addressed This Visit       Cardiovascular and Mediastinum   Hypertension associated with diabetes ([place])    Well controlled Continue current medications Recheck metabolic panel F/u in 6 months       Relevant Medications   Semaglutide,0.25 or 0.5MG/DOS, (OZEMPIC, 0.25 OR 0.5 MG/DOSE,) 2 MG/1.5ML SOPN   Other Relevant Orders   Comprehensive metabolic  panel (Completed)     Endocrine   Diabetes (HCC) - Primary    Chronic and well controlled Complicated by HTN, HLD' No changes to meds UTD on screenigns Foot exam today      Relevant Medications   Semaglutide,0.25 or 0.5MG/DOS, (OZEMPIC, 0.25 OR 0.5 MG/DOSE,) 2 MG/1.5ML SOPN   Other Relevant Orders   Comprehensive metabolic panel (Completed)   POCT glycosylated hemoglobin (Hb A1C) (Completed)   Microalbuminuria due to type 2 diabetes mellitus (HCC)    Continue losartan      Relevant Medications   Semaglutide,0.25 or 0.5MG/DOS, (OZEMPIC, 0.25 OR 0.5 MG/DOSE,) 2 MG/1.5ML SOPN   Hyperlipidemia associated with type 2 diabetes mellitus (HCC)    Previously well controlled Continue statin Repeat FLP and CMP Goal LDL less than 70      Relevant Medications   Semaglutide,0.25 or 0.5MG/DOS, (OZEMPIC, 0.25 OR 0.5 MG/DOSE,) 2 MG/1.5ML SOPN   Other Relevant Orders   Comprehensive metabolic panel (Completed)   Lipid panel (Completed)     Nervous and Auditory   Neuropathic postherpetic trigeminal neuralgia (Location of Primary Source of Pain) (Right) (V1)    Followed by neurology Continue gabapentin and Lyrica per neurology She continues to have daily pain and feels as though she has failed many therapies She is interested in seeing if interventional pain management has any possible options for her Referral placed today      Relevant Orders   Ambulatory referral to Pain Clinic     Other   Morbid obesity ([place])    BMI 38 and associated with T2DM, HTN, HLD Discussed importance of healthy weight management Discussed diet and exercise       Relevant Medications   Semaglutide,0.25 or 0.5MG/DOS, (OZEMPIC, 0.25 OR 0.5 MG/DOSE,) 2 MG/1.5ML SOPN   Other Visit Diagnoses     Need for influenza  vaccination       Relevant Orders   Flu Vaccine QUAD High Dose(Fluad) (Completed)        Return in about 6 months (around 11/14/2021) for chronic disease f/u.      I, Lavon Paganini,  MD, have reviewed all documentation for this visit. The documentation on 05/17/21 for the exam, diagnosis, procedures, and orders are all accurate and complete.   Bacigalupo, Dionne Bucy, MD, MPH [facility]

## 2021-05-16 ENCOUNTER — Ambulatory Visit (INDEPENDENT_AMBULATORY_CARE_PROVIDER_SITE_OTHER): Payer: Medicare Other | Admitting: Family Medicine

## 2021-05-16 ENCOUNTER — Encounter: Payer: Self-pay | Admitting: Family Medicine

## 2021-05-16 ENCOUNTER — Other Ambulatory Visit: Payer: Self-pay

## 2021-05-16 VITALS — BP 130/72 | HR 81 | Resp 16 | Ht 63.0 in | Wt 214.7 lb

## 2021-05-16 DIAGNOSIS — E1129 Type 2 diabetes mellitus with other diabetic kidney complication: Secondary | ICD-10-CM | POA: Diagnosis not present

## 2021-05-16 DIAGNOSIS — E1159 Type 2 diabetes mellitus with other circulatory complications: Secondary | ICD-10-CM

## 2021-05-16 DIAGNOSIS — I152 Hypertension secondary to endocrine disorders: Secondary | ICD-10-CM | POA: Diagnosis not present

## 2021-05-16 DIAGNOSIS — B0222 Postherpetic trigeminal neuralgia: Secondary | ICD-10-CM

## 2021-05-16 DIAGNOSIS — E785 Hyperlipidemia, unspecified: Secondary | ICD-10-CM

## 2021-05-16 DIAGNOSIS — E1169 Type 2 diabetes mellitus with other specified complication: Secondary | ICD-10-CM | POA: Diagnosis not present

## 2021-05-16 DIAGNOSIS — Z23 Encounter for immunization: Secondary | ICD-10-CM | POA: Diagnosis not present

## 2021-05-16 DIAGNOSIS — R809 Proteinuria, unspecified: Secondary | ICD-10-CM | POA: Diagnosis not present

## 2021-05-16 LAB — POCT GLYCOSYLATED HEMOGLOBIN (HGB A1C)
Est. average glucose Bld gHb Est-mCnc: 126
Hemoglobin A1C: 6 % — AB (ref 4.0–5.6)

## 2021-05-16 MED ORDER — OZEMPIC (0.25 OR 0.5 MG/DOSE) 2 MG/1.5ML ~~LOC~~ SOPN: 0.5000 mg | PEN_INJECTOR | SUBCUTANEOUS | 3 refills | Status: DC

## 2021-05-16 NOTE — Assessment & Plan Note (Signed)
Well controlled Continue current medications Recheck metabolic panel F/u in 6 months  

## 2021-05-16 NOTE — Assessment & Plan Note (Signed)
Chronic and well controlled Complicated by HTN, HLD' No changes to meds UTD on screenigns Foot exam today

## 2021-05-17 LAB — COMPREHENSIVE METABOLIC PANEL
ALT: 11 IU/L (ref 0–32)
AST: 14 IU/L (ref 0–40)
Albumin/Globulin Ratio: 1.6 (ref 1.2–2.2)
Albumin: 4.2 g/dL (ref 3.7–4.7)
Alkaline Phosphatase: 93 IU/L (ref 44–121)
BUN/Creatinine Ratio: 9 — ABNORMAL LOW (ref 12–28)
BUN: 9 mg/dL (ref 8–27)
Bilirubin Total: 0.4 mg/dL (ref 0.0–1.2)
CO2: 23 mmol/L (ref 20–29)
Calcium: 9.6 mg/dL (ref 8.7–10.3)
Chloride: 103 mmol/L (ref 96–106)
Creatinine, Ser: 0.97 mg/dL (ref 0.57–1.00)
Globulin, Total: 2.6 g/dL (ref 1.5–4.5)
Glucose: 112 mg/dL — ABNORMAL HIGH (ref 70–99)
Potassium: 4.3 mmol/L (ref 3.5–5.2)
Sodium: 140 mmol/L (ref 134–144)
Total Protein: 6.8 g/dL (ref 6.0–8.5)
eGFR: 62 mL/min/{1.73_m2} (ref >59)

## 2021-05-17 LAB — LIPID PANEL
Chol/HDL Ratio: 3.3 ratio (ref 0.0–4.4)
Cholesterol, Total: 137 mg/dL (ref 100–199)
HDL: 41 mg/dL (ref >39)
LDL Chol Calc (NIH): 76 mg/dL (ref 0–99)
Triglycerides: 112 mg/dL (ref 0–149)
VLDL Cholesterol Cal: 20 mg/dL (ref 5–40)

## 2021-05-17 NOTE — Assessment & Plan Note (Signed)
Previously well controlled Continue statin Repeat FLP and CMP Goal LDL less than 70

## 2021-05-17 NOTE — Assessment & Plan Note (Signed)
Followed by neurology Continue gabapentin and Lyrica per neurology She continues to have daily pain and feels as though she has failed many therapies She is interested in seeing if interventional pain management has any possible options for her Referral placed today

## 2021-05-17 NOTE — Assessment & Plan Note (Signed)
BMI 38 and associated with T2DM, HTN, HLD Discussed importance of healthy weight management Discussed diet and exercise

## 2021-05-17 NOTE — Assessment & Plan Note (Signed)
Continue losartan. 

## 2021-06-03 ENCOUNTER — Ambulatory Visit: Payer: Medicare Other | Admitting: Physician Assistant

## 2021-06-03 NOTE — Progress Notes (Deleted)
Established patient visit   Patient: [name]   DOB: 1951/04/19   70 y.o. Female  MRN: [id number] Visit Date: 06/03/2021  Today's healthcare provider: Mikey Kirschner, PA-C   No chief complaint on file.  Subjective    HPI  Hypertension, follow-up  BP Readings from Last 3 Encounters:  05/16/21 130/72  11/06/20 140/86  10/10/20 134/84   Wt Readings from Last 3 Encounters:  05/16/21 214 lb 11.2 oz (97.4 kg)  11/06/20 224 lb (101.6 kg)  10/10/20 226 lb (102.5 kg)     She was last seen for hypertension {NUMBERS 1-12:18279} {days/wks/mos/yrs:310907} ago.  BP at that visit was ***. Management since that visit includes ***.  She reports {excellent/good/fair/poor:19665} compliance with treatment. She {is/is not:9024} having side effects. {document side effects if present:1} She is following a {diet:21022986} diet. She {is/is not:9024} exercising. She {does/does not:200015} smoke.  Use of agents associated with hypertension: {bp agents assoc with hypertension:511::"none"}.   Outside blood pressures are {***enter patient reported home BP readings, or 'not being checked':1}. Symptoms: {Yes/No:20286} chest pain {Yes/No:20286} chest pressure  {Yes/No:20286} palpitations {Yes/No:20286} syncope  {Yes/No:20286} dyspnea {Yes/No:20286} orthopnea  {Yes/No:20286} paroxysmal nocturnal dyspnea {Yes/No:20286} lower extremity edema   Pertinent labs: Lab Results  Component Value Date   CHOL 137 05/16/2021   HDL 41 05/16/2021   LDLCALC 76 05/16/2021   TRIG 112 05/16/2021   CHOLHDL 3.3 05/16/2021   Lab Results  Component Value Date   NA 140 05/16/2021   K 4.3 05/16/2021   CREATININE 0.97 05/16/2021   EGFR 62 05/16/2021   GLUCOSE 112 (H) 05/16/2021   TSH 1.910 03/13/2020     The 10-year ASCVD risk score (Arnett DK, et al., 2019) is: 20.5%   ---------------------------------------------------------------------------------------------------   {Link to patient history  deactivated due to formatting error:1}  Medications: Outpatient Medications Prior to Visit  Medication Sig   albuterol (VENTOLIN HFA) 108 (90 Base) MCG/ACT inhaler INHALE 1-2 PUFFS BY MOUTH EVERY 6 HOURS AS NEEDED FOR WHEEZE OR SHORTNESS OF BREATH   aspirin EC 81 MG tablet Take 81 mg by mouth daily.   atorvastatin (LIPITOR) 80 MG tablet Take 1 tablet (80 mg total) by mouth daily.   carboxymethylcellulose (REFRESH PLUS) 0.5 % SOLN Apply 2 drops to eye 2 (two) times daily as needed.   fluconazole (DIFLUCAN) 150 MG tablet Take 1 tablet (150 mg total) by mouth as directed. Take one tablet by mouth on day 1. May repeat dose of one tablet by mouth on day five.   fluticasone furoate-vilanterol (BREO ELLIPTA) 100-25 MCG/INH AEPB Inhale 1 puff into the lungs 2 (two) times daily.   gabapentin (NEURONTIN) 600 MG tablet Take 2 tablets (1,200 mg total) by mouth 3 (three) times daily.   hydrochlorothiazide (HYDRODIURIL) 12.5 MG tablet Take 1 tablet (12.5 mg total) by mouth daily.   hydrocortisone (ANUSOL-HC) 2.5 % rectal cream Place 1 application rectally 2 (two) times daily.   Insulin Pen Needle (B-D UF III MINI PEN NEEDLES) 31G X 5 MM MISC Use as directed   ketoconazole (NIZORAL) 2 % cream Apply 1 application topically daily.   losartan (COZAAR) 100 MG tablet Take 1 tablet (100 mg total) by mouth daily.   nystatin (MYCOSTATIN/NYSTOP) powder Apply 1 application topically 3 (three) times daily.   omeprazole (PRILOSEC) 20 MG capsule Take 1 capsule (20 mg total) by mouth daily.   pregabalin (LYRICA) 300 MG capsule Take 1 capsule (300 mg total) by mouth 2 (two) times daily.  Semaglutide,0.25 or 0.5MG /DOS, (OZEMPIC, 0.25 OR 0.5 MG/DOSE,) 2 MG/1.5ML SOPN Inject 0.5 mg into the skin once a week.   triamcinolone ointment (KENALOG) 0.5 % Apply 1 application topically 2 (two) times daily.   VITAMIN D PO Take by mouth daily.   No facility-administered medications prior to visit.    Review of Systems  {Labs    Heme   Chem   Endocrine   Serology   Results Review (optional):23779}   Objective    There were no vitals taken for this visit. {Show previous vital signs (optional):23777}  Physical Exam  ***  No results found for any visits on 06/03/21.  Assessment & Plan     ***  No follow-ups on file.      {provider attestation***:1}   Mikey Kirschner, PA-C  [facility] [phone] (phone) [phone] (fax)  [place]

## 2021-07-09 ENCOUNTER — Other Ambulatory Visit: Payer: Self-pay

## 2021-07-09 ENCOUNTER — Ambulatory Visit (INDEPENDENT_AMBULATORY_CARE_PROVIDER_SITE_OTHER)

## 2021-07-09 VITALS — BP 132/80 | HR 75 | Temp 97.7°F | Ht 63.0 in | Wt 213.9 lb

## 2021-07-09 DIAGNOSIS — Z780 Asymptomatic menopausal state: Secondary | ICD-10-CM

## 2021-07-09 DIAGNOSIS — Z0000 Encounter for general adult medical examination without abnormal findings: Secondary | ICD-10-CM | POA: Diagnosis not present

## 2021-07-09 NOTE — Patient Instructions (Signed)
[name] , Thank you for taking time to come for your Medicare Wellness Visit. I appreciate your ongoing commitment to your health goals. Please review the following plan we discussed and let me know if I can assist you in the future.   Screening recommendations/referrals: Colonoscopy: 05/02/19 Mammogram: 05/07/21 Bone Density: referral sent Recommended yearly ophthalmology/optometry visit for glaucoma screening and checkup Recommended yearly dental visit for hygiene and checkup  Vaccinations: Influenza vaccine: 05/16/21 Pneumococcal vaccine: 04/09/18 Tdap vaccine: 07/25/08 due Shingles vaccine: Shingrix  06/02/19 08/08/19   Covid-19:08/29/19, 09/29/19, 07/21/20  Advanced directives: no  Conditions/risks identified: none  Next appointment: Follow up in one year for your annual wellness visit 07/10/22 @ 9am in person   Preventive Care 73 Years and Older, Female Preventive care refers to lifestyle choices and visits with your health care provider that can promote health and wellness. What does preventive care include? A yearly physical exam. This is also called an annual well check. Dental exams once or twice a year. Routine eye exams. Ask your health care provider how often you should have your eyes checked. Personal lifestyle choices, including: Daily care of your teeth and gums. Regular physical activity. Eating a healthy diet. Avoiding tobacco and drug use. Limiting alcohol use. Practicing safe sex. Taking low-dose aspirin every day. Taking vitamin and mineral supplements as recommended by your health care provider. What happens during an annual well check? The services and screenings done by your health care provider during your annual well check will depend on your age, overall health, lifestyle risk factors, and family history of disease. Counseling  Your health care provider may ask you questions about your: Alcohol use. Tobacco use. Drug use. Emotional well-being. Home  and relationship well-being. Sexual activity. Eating habits. History of falls. Memory and ability to understand (cognition). Work and work Statistician. Reproductive health. Screening  You may have the following tests or measurements: Height, weight, and BMI. Blood pressure. Lipid and cholesterol levels. These may be checked every 5 years, or more frequently if you are over 73 years old. Skin check. Lung cancer screening. You may have this screening every year starting at age 73 if you have a 30-pack-year history of smoking and currently smoke or have quit within the past 15 years. Fecal occult blood test (FOBT) of the stool. You may have this test every year starting at age 73. Flexible sigmoidoscopy or colonoscopy. You may have a sigmoidoscopy every 5 years or a colonoscopy every 10 years starting at age 73. Hepatitis C blood test. Hepatitis B blood test. Sexually transmitted disease (STD) testing. Diabetes screening. This is done by checking your blood sugar (glucose) after you have not eaten for a while (fasting). You may have this done every 1-3 years. Bone density scan. This is done to screen for osteoporosis. You may have this done starting at age 73. Mammogram. This may be done every 1-2 years. Talk to your health care provider about how often you should have regular mammograms. Talk with your health care provider about your test results, treatment options, and if necessary, the need for more tests. Vaccines  Your health care provider may recommend certain vaccines, such as: Influenza vaccine. This is recommended every year. Tetanus, diphtheria, and acellular pertussis (Tdap, Td) vaccine. You may need a Td booster every 10 years. Zoster vaccine. You may need this after age 73. Pneumococcal 13-valent conjugate (PCV13) vaccine. One dose is recommended after age 73. Pneumococcal polysaccharide (PPSV23) vaccine. One dose is recommended after age 73. Talk  to your health care provider  about which screenings and vaccines you need and how often you need them. This information is not intended to replace advice given to you by your health care provider. Make sure you discuss any questions you have with your health care provider. Document Released: 06/29/2015 Document Revised: 02/20/2016 Document Reviewed: 04/03/2015 Elsevier Interactive Patient Education  2017 [place] Prevention in the Home Falls can cause injuries. They can happen to people of all ages. There are many things you can do to make your home safe and to help prevent falls. What can I do on the outside of my home? Regularly fix the edges of walkways and driveways and fix any cracks. Remove anything that might make you trip as you walk through a door, such as a raised step or threshold. Trim any bushes or trees on the path to your home. Use bright outdoor lighting. Clear any walking paths of anything that might make someone trip, such as rocks or tools. Regularly check to see if handrails are loose or broken. Make sure that both sides of any steps have handrails. Any raised decks and porches should have guardrails on the edges. Have any leaves, snow, or ice cleared regularly. Use sand or salt on walking paths during winter. Clean up any spills in your garage right away. This includes oil or grease spills. What can I do in the bathroom? Use night lights. Install grab bars by the toilet and in the tub and shower. Do not use towel bars as grab bars. Use non-skid mats or decals in the tub or shower. If you need to sit down in the shower, use a plastic, non-slip stool. Keep the floor dry. Clean up any water that spills on the floor as soon as it happens. Remove soap buildup in the tub or shower regularly. Attach bath mats securely with double-sided non-slip rug tape. Do not have throw rugs and other things on the floor that can make you trip. What can I do in the bedroom? Use night lights. Make sure  that you have a light by your bed that is easy to reach. Do not use any sheets or blankets that are too big for your bed. They should not hang down onto the floor. Have a firm chair that has side arms. You can use this for support while you get dressed. Do not have throw rugs and other things on the floor that can make you trip. What can I do in the kitchen? Clean up any spills right away. Avoid walking on wet floors. Keep items that you use a lot in easy-to-reach places. If you need to reach something above you, use a strong step stool that has a grab bar. Keep electrical cords out of the way. Do not use floor polish or wax that makes floors slippery. If you must use wax, use non-skid floor wax. Do not have throw rugs and other things on the floor that can make you trip. What can I do with my stairs? Do not leave any items on the stairs. Make sure that there are handrails on both sides of the stairs and use them. Fix handrails that are broken or loose. Make sure that handrails are as long as the stairways. Check any carpeting to make sure that it is firmly attached to the stairs. Fix any carpet that is loose or worn. Avoid having throw rugs at the top or bottom of the stairs. If you do have throw rugs,  attach them to the floor with carpet tape. Make sure that you have a light switch at the top of the stairs and the bottom of the stairs. If you do not have them, ask someone to add them for you. What else can I do to help prevent falls? Wear shoes that: Do not have high heels. Have rubber bottoms. Are comfortable and fit you well. Are closed at the toe. Do not wear sandals. If you use a stepladder: Make sure that it is fully opened. Do not climb a closed stepladder. Make sure that both sides of the stepladder are locked into place. Ask someone to hold it for you, if possible. Clearly mark and make sure that you can see: Any grab bars or handrails. First and last steps. Where the edge of  each step is. Use tools that help you move around (mobility aids) if they are needed. These include: Canes. Walkers. Scooters. Crutches. Turn on the lights when you go into a dark area. Replace any light bulbs as soon as they burn out. Set up your furniture so you have a clear path. Avoid moving your furniture around. If any of your floors are uneven, fix them. If there are any pets around you, be aware of where they are. Review your medicines with your doctor. Some medicines can make you feel dizzy. This can increase your chance of falling. Ask your doctor what other things that you can do to help prevent falls. This information is not intended to replace advice given to you by your health care provider. Make sure you discuss any questions you have with your health care provider. Document Released: 03/29/2009 Document Revised: 11/08/2015 Document Reviewed: 07/07/2014 Elsevier Interactive Patient Education  2017 [employer].

## 2021-07-09 NOTE — Progress Notes (Signed)
Subjective:   [name] is a 71 y.o. female who presents for Medicare Annual (Subsequent) preventive examination.  Review of Systems           Objective:    Today's Vitals   07/09/21 0912  BP: 132/80  Pulse: 75  Temp: 97.7 F (36.5 C)  TempSrc: Oral  SpO2: 98%  Weight: 213 lb 14.4 oz (97 kg)  Height: 5\' 3"  (1.6 m)   Body mass index is 37.89 kg/m.  Advanced Directives 07/03/2020 05/02/2019 04/13/2019 07/22/2018 04/09/2018 10/28/2017 04/27/2017  Does Patient Have a Medical Advance Directive? Yes Yes Yes No Yes Yes Yes  Type of Paramedic of [place];Living will [place];Living will [place];Living will - [place];Living will Living will Living will  Does patient want to make changes to medical advance directive? - No - Patient declined - - - - -  Copy of [place] in Chart? No - copy requested No - copy requested No - copy requested - No - copy requested - -  Would patient like information on creating a medical advance directive? - - - - - - -    Current Medications (verified) Outpatient Encounter Medications as of 07/09/2021  Medication Sig   albuterol (VENTOLIN HFA) 108 (90 Base) MCG/ACT inhaler INHALE 1-2 PUFFS BY MOUTH EVERY 6 HOURS AS NEEDED FOR WHEEZE OR SHORTNESS OF BREATH   aspirin EC 81 MG tablet Take 81 mg by mouth daily.   atorvastatin (LIPITOR) 80 MG tablet Take 1 tablet (80 mg total) by mouth daily.   carboxymethylcellulose (REFRESH PLUS) 0.5 % SOLN Apply 2 drops to eye 2 (two) times daily as needed.   fluconazole (DIFLUCAN) 150 MG tablet Take 1 tablet (150 mg total) by mouth as directed. Take one tablet by mouth on day 1. May repeat dose of one tablet by mouth on day five.   fluticasone furoate-vilanterol (BREO ELLIPTA) 100-25 MCG/INH AEPB Inhale 1 puff into the lungs 2 (two) times daily.   gabapentin (NEURONTIN) 600 MG tablet Take 2 tablets (1,200 mg total)  by mouth 3 (three) times daily.   hydrochlorothiazide (HYDRODIURIL) 12.5 MG tablet Take 1 tablet (12.5 mg total) by mouth daily.   hydrocortisone (ANUSOL-HC) 2.5 % rectal cream Place 1 application rectally 2 (two) times daily.   Insulin Pen Needle (B-D UF III MINI PEN NEEDLES) 31G X 5 MM MISC Use as directed   ketoconazole (NIZORAL) 2 % cream Apply 1 application topically daily.   losartan (COZAAR) 100 MG tablet Take 1 tablet (100 mg total) by mouth daily.   nystatin (MYCOSTATIN/NYSTOP) powder Apply 1 application topically 3 (three) times daily.   omeprazole (PRILOSEC) 20 MG capsule Take 1 capsule (20 mg total) by mouth daily.   pregabalin (LYRICA) 300 MG capsule Take 1 capsule (300 mg total) by mouth 2 (two) times daily.   Semaglutide,0.25 or 0.5MG /DOS, (OZEMPIC, 0.25 OR 0.5 MG/DOSE,) 2 MG/1.5ML SOPN Inject 0.5 mg into the skin once a week.   triamcinolone ointment (KENALOG) 0.5 % Apply 1 application topically 2 (two) times daily.   VITAMIN D PO Take by mouth daily.   No facility-administered encounter medications on file as of 07/09/2021.    Allergies (verified) Lisinopril, Lovastatin, and Shellfish allergy   History: Past Medical History:  Diagnosis Date   Anxiety    Arthritis    Asthma    Bronchitis 09/04/2016   urgent care   Diabetes mellitus without complication ([place])  Elevated C-reactive protein (CRP) 10/06/2016   GERD (gastroesophageal reflux disease)    History of shingles    Hyperlipidemia    Hypertension    Neuralgia    Right side of head and face, s/p shingles   Shingles    chronic on Rt side of head and face   Wears dentures    partial upper   Past Surgical History:  Procedure Laterality Date   ABDOMINAL HYSTERECTOMY     BREAST BIOPSY Right 2016   benign- tophat clip   BREAST BIOPSY Left 04/08/2016   -benign-fibroadenomatous changes- tophat clip   BREAST LUMPECTOMY Right 03/2016   BREAST LUMPECTOMY WITH NEEDLE LOCALIZATION Right 05/02/2016   Procedure:  BREAST LUMPECTOMY WITH NEEDLE LOCALIZATION;  Surgeon: Clayburn Pert, MD;  Location: [facility];  Service: General;  Laterality: Right;   CHOLECYSTECTOMY     COLONOSCOPY WITH PROPOFOL N/A 05/02/2019   Procedure: COLONOSCOPY WITH BIOPSY;  Surgeon: Lucilla Lame, MD;  Location: [place];  Service: Endoscopy;  Laterality: N/A;  diabetic - diet controlled   KNEE ARTHROPLASTY Left 12/01/2016   Procedure: COMPUTER ASSISTED TOTAL KNEE ARTHROPLASTY;  Surgeon: Dereck Leep, MD;  Location: [facility];  Service: Orthopedics;  Laterality: Left;   POLYPECTOMY N/A 05/02/2019   Procedure: POLYPECTOMY;  Surgeon: Lucilla Lame, MD;  Location: [place];  Service: Endoscopy;  Laterality: N/A;   TOTAL KNEE ARTHROPLASTY Right 2014   TOTAL KNEE ARTHROPLASTY Right 02/16/2013   Family History  Problem Relation Age of Onset   Anuerysm Mother        Brain   Dementia Mother    Diabetes Mother    Heart failure Father    CAD Father    Dementia Father    Diabetes Father    Lupus Sister    Throat cancer Maternal Grandfather    Diabetes Brother    Breast cancer Neg Hx    Social History   Socioeconomic History   Marital status: Widowed    Spouse name: Not on file   Number of children: 2   Years of education: Not on file   Highest education level: High school graduate  Occupational History   Occupation: retired  Tobacco Use   Smoking status: Never   Smokeless tobacco: Never  Vaping Use   Vaping Use: Never used  Substance and Sexual Activity   Alcohol use: No    Alcohol/week: 0.0 standard drinks   Drug use: No   Sexual activity: Not on file  Other Topics Concern   Not on file  Social History Narrative   Not on file   Social Determinants of Health   Financial Resource Strain: Not on file  Food Insecurity: Not on file  Transportation Needs: Not on file  Physical Activity: Not on file  Stress: Not on file  Social Connections: Not on file    Tobacco Counseling Counseling  given: Not Answered   Clinical Intake:  Pre-visit preparation completed: Yes  Pain : No/denies pain     Nutritional Risks: None Diabetes: Yes CBG done?: No Did pt. bring in CBG monitor from home?: No  How often do you need to have someone help you when you read instructions, pamphlets, or other written materials from your doctor or pharmacy?: 1 - Never  Diabetic?yes Nutrition Risk Assessment:  Has the patient had any N/V/D within the last 2 months?  No  Does the patient have any non-healing wounds?  No  Has the patient had any unintentional weight loss or weight  gain?  No   Diabetes:  Is the patient diabetic?  Yes  If diabetic, was a CBG obtained today?  No  Did the patient bring in their glucometer from home?  No  How often do you monitor your CBG's? never.   Financial Strains and Diabetes Management:  Are you having any financial strains with the device, your supplies or your medication? No .  Does the patient want to be seen by Chronic Care Management for management of their diabetes?  No  Would the patient like to be referred to a Nutritionist or for Diabetic Management?  No   Diabetic Exams:  Diabetic Eye Exam: Completed 04/02/21. (Negative retinopathy)  Diabetic Foot Exam: Completed 05/16/21. Pt has been advised about the importance in completing this exam. Interpreter Needed?: No  Information entered by :: Kirke Shaggy, LPN   Activities of Daily Living In your present state of health, do you have any difficulty performing the following activities: 05/16/2021 09/06/2020  Hearing? N N  Vision? N N  Difficulty concentrating or making decisions? N N  Walking or climbing stairs? N N  Dressing or bathing? N N  Doing errands, shopping? N N  Some recent data might be hidden    Patient Care Team: Bacigalupo, Dionne Bucy, MD as PCP - General (Family Medicine) Milinda Pointer, MD as Referring Physician (Pain Medicine) Marry Guan Laurice Record, MD as Consulting Physician  (Orthopedic Surgery) Eulogio Bear, MD as Consulting Physician (Ophthalmology) Vladimir Crofts, MD as Consulting Physician (Neurology) Sunday Corn Criss Alvine Hubbard Hartshorn (Neurology)  Indicate any recent Medical Services you may have received from other than [facility] providers in the past year (date may be approximate).     Assessment:   This is a routine wellness examination for [place].  Hearing/Vision screen No results found.  Dietary issues and exercise activities discussed:     Goals Addressed   None    Depression Screen PHQ 2/9 Scores 05/16/2021 09/06/2020 08/06/2020 07/03/2020 03/06/2020 04/13/2019 04/09/2018  PHQ - 2 Score 2 6 0 0 0 0 3  PHQ- 9 Score 3 10 5  - 0 - 6    Fall Risk Fall Risk  05/16/2021 09/06/2020 08/06/2020 07/03/2020 03/06/2020  Falls in the past year? 0 0 0 0 0  Number falls in past yr: 0 0 0 0 0  Injury with Fall? 0 0 0 0 0  Risk for fall due to : - No Fall Risks - - No Fall Risks  Risk for fall due to: Comment - - - - -  Follow up - Falls evaluation completed - - Follow up appointment    FALL RISK PREVENTION PERTAINING TO THE HOME:  Any stairs in or around the home? Yes  If so, are there any without handrails? No  Home free of loose throw rugs in walkways, pet beds, electrical cords, etc? Yes  Adequate lighting in your home to reduce risk of falls? Yes   ASSISTIVE DEVICES UTILIZED TO PREVENT FALLS:  Life alert? No  Use of a cane, walker or w/c? No  Grab bars in the bathroom? No  Shower chair or bench in shower? No  Elevated toilet seat or a handicapped toilet? No   TIMED UP AND GO:  Was the test performed? Yes .  Length of time to ambulate 10 feet: 4 sec.   Gait steady and fast without use of assistive device  Cognitive Function:Normal cognitive status assessed by direct observation by this Nurse Health Advisor. No abnormalities found.  Immunizations Immunization History  Administered Date(s) Administered   Fluad Quad(high Dose 65+)  04/05/2019, 03/06/2020, 05/16/2021   Influenza Split 04/09/2010, 04/02/2012, 05/26/2012   Influenza, High Dose Seasonal PF 04/04/2014, 02/29/2016, 02/24/2017, 04/09/2018   Moderna Sars-Covid-2 Vaccination 08/29/2019, 09/29/2019, 07/21/2020   Pneumococcal Conjugate-13 02/24/2017   Pneumococcal Polysaccharide-23 04/09/2018   Tdap 07/25/2008   Zoster Recombinat (Shingrix) 06/02/2019, 08/08/2019    TDAP status: Due, Education has been provided regarding the importance of this vaccine. Advised may receive this vaccine at local pharmacy or Health Dept. Aware to provide a copy of the vaccination record if obtained from local pharmacy or Health Dept. Verbalized acceptance and understanding.  Flu Vaccine status: Up to date  Pneumococcal vaccine status: Up to date  Covid-19 vaccine status: Completed vaccines  Qualifies for Shingles Vaccine? Yes   Zostavax completed No   Shingrix Completed?: Yes  Screening Tests Health Maintenance  Topic Date Due   TETANUS/TDAP  07/25/2018   COVID-19 Vaccine (4 - Booster for Moderna series) 09/15/2020   HEMOGLOBIN A1C  11/14/2021   OPHTHALMOLOGY EXAM  04/02/2022   FOOT EXAM  05/16/2022   MAMMOGRAM  05/08/2023   COLONOSCOPY (Pts 45-93yrs Insurance coverage will need to be confirmed)  05/01/2024   Pneumonia Vaccine 12+ Years old  Completed   INFLUENZA VACCINE  Completed   DEXA SCAN  Completed   Hepatitis C Screening  Completed   Zoster Vaccines- Shingrix  Completed   HPV VACCINES  Aged Out    Health Maintenance  Health Maintenance Due  Topic Date Due   TETANUS/TDAP  07/25/2018   COVID-19 Vaccine (4 - Booster for Moderna series) 09/15/2020    Colorectal cancer screening: Type of screening: Colonoscopy. Completed 05/02/19. Repeat every 5 years  Mammogram status: Completed 05/07/21. Repeat every year  Bone Density status: Ordered 07/09/21. Pt provided with contact info and advised to call to schedule appt.  Lung Cancer Screening: (Low Dose CT  Chest recommended if Age 40-80 years, 30 pack-year currently smoking OR have quit w/in 15years.) does not qualify.    Additional Screening:  Hepatitis C Screening: does qualify; Completed 04/02/12  Vision Screening: Recommended annual ophthalmology exams for early detection of glaucoma and other disorders of the eye. Is the patient up to date with their annual eye exam?  Yes  Who is the provider or what is the name of the office in which the patient attends annual eye exams? [facility] If pt is not established with a provider, would they like to be referred to a provider to establish care? No .   Dental Screening: Recommended annual dental exams for proper oral hygiene  Community Resource Referral / Chronic Care Management: CRR required this visit?  No   CCM required this visit?  No      Plan:     I have personally reviewed and noted the following in the patients chart:   Medical and social history Use of alcohol, tobacco or illicit drugs  Current medications and supplements including opioid prescriptions.  Functional ability and status Nutritional status Physical activity Advanced directives List of other physicians Hospitalizations, surgeries, and ER visits in previous 12 months Vitals Screenings to include cognitive, depression, and falls Referrals and appointments  In addition, I have reviewed and discussed with patient certain preventive protocols, quality metrics, and best practice recommendations. A written personalized care plan for preventive services as well as general preventive health recommendations were provided to patient.     Dionisio David, LPN   [id number]  Nurse Notes: none

## 2021-07-22 ENCOUNTER — Other Ambulatory Visit: Payer: Self-pay | Admitting: Family Medicine

## 2021-07-22 DIAGNOSIS — I10 Essential (primary) hypertension: Secondary | ICD-10-CM

## 2021-07-22 NOTE — Telephone Encounter (Addendum)
Medication Refill - Medication:hydrochlorothiazide (HYDRODIURIL) 12.5 MG tablet /  Has the patient contacted their pharmacy? hydrochlorothiazide (HYDRODIURIL) 12.5 MG table (Agent: If no, request that the patient contact the pharmacy for the refill. If patient does not wish to contact the pharmacy document the reason why and proceed with request.) (Agent: If yes, when and what did the pharmacy advise?)contact pcp  Preferred Pharmacy (with phone number or street name):  [place], [place] Phone:  [phone]  Fax:  [phone]     Has the patient been seen for an appointment in the last year OR does the patient have an upcoming appointment? yes  Agent: Please be advised that RX refills may take up to 3 business days. We ask that you follow-up with your pharmacy.

## 2021-07-22 NOTE — Telephone Encounter (Signed)
Was advised to contact pcp.   Sorry pharmacy is  [facility] [place], [place] Phone:  [phone]  Fax:  [phone]

## 2021-07-23 MED ORDER — HYDROCHLOROTHIAZIDE 12.5 MG PO TABS: 12.5000 mg | ORAL_TABLET | Freq: Every day | ORAL | 3 refills | Status: DC

## 2021-07-23 NOTE — Telephone Encounter (Signed)
Requested medication (s) are due for refill today: Yes  Requested medication (s) are on the active medication list: Yes  Last refill:  07/05/20  Future visit scheduled: Yes  Notes to clinic:  Prescription expired.    Requested Prescriptions  Pending Prescriptions Disp Refills   hydrochlorothiazide (HYDRODIURIL) 12.5 MG tablet 90 tablet 3    Sig: Take 1 tablet (12.5 mg total) by mouth daily.     Cardiovascular: Diuretics - Thiazide Passed - 07/22/2021  5:22 PM      Passed - Cr in normal range and within 180 days    Creat  Date Value Ref Range Status  03/26/2017 0.96 0.50 - 0.99 mg/dL Final    Comment:    For patients >62 years of age, the reference limit for Creatinine is approximately 13% higher for people identified as African-American. .    Creatinine, Ser  Date Value Ref Range Status  05/16/2021 0.97 0.57 - 1.00 mg/dL Final          Passed - K in normal range and within 180 days    Potassium  Date Value Ref Range Status  05/16/2021 4.3 3.5 - 5.2 mmol/L Final  11/03/2013 3.8 3.5 - 5.1 mmol/L Final          Passed - Na in normal range and within 180 days    Sodium  Date Value Ref Range Status  05/16/2021 140 134 - 144 mmol/L Final  11/03/2013 139 136 - 145 mmol/L Final          Passed - Last BP in normal range    BP Readings from Last 1 Encounters:  07/09/21 132/80          Passed - Valid encounter within last 6 months    Recent Outpatient Visits           2 months ago Type 2 diabetes mellitus with other specified complication, without long-term current use of insulin ([place])   [facility] [place], Dionne Bucy, MD   8 months ago Type 2 diabetes mellitus with other specified complication, without long-term current use of insulin [facility])   [facility], Dionne Bucy, MD   9 months ago [place] [facility], Laurita Quint, FNP   9 months ago [place], Kelby Aline, FNP   10  months ago Type 2 diabetes mellitus with other specified complication, without long-term current use of insulin [facility])   [facility] Bacigalupo, Dionne Bucy, MD       Future Appointments             In 3 months Bacigalupo, Dionne Bucy, MD [facility]

## 2021-08-17 NOTE — Progress Notes (Signed)
Patient: [name]  Service Category: E/M  Provider: Gaspar Cola, MD  DOB: 1951-02-21  DOS: 08/19/2021  Referring Provider: Virginia Crews, MD  MRN: [id number]  Setting: Ambulatory outpatient  PCP: Virginia Crews, MD  Type: New Patient  Specialty: Interventional Pain Management    Location: Office  Delivery: Face-to-face     Primary Reason(s) for Visit: Encounter for initial evaluation of one or more chronic problems (new to examiner) potentially causing chronic pain, and posing a threat to normal musculoskeletal function. (Level of risk: High) CC: Facial Pain (Face/head)  HPI  [name] is a 71 y.o. year old, female patient, who comes for the first time to our practice referred by Virginia Crews, MD for our initial evaluation of her chronic pain. She has Postherpetic neuralgia; Depression; Morbid obesity ([place]); Neuropathic postherpetic trigeminal neuralgia (1ry area of Pain) (Right) (V1); Anxiety; Asthma; Diabetes ([place]); Photopsia; History of colon polyps; Calcium deficiency disease; Cannot sleep; Hypercholesterolemia without hypertriglyceridemia; Avitaminosis A; Avitaminosis D; Gasserian ganglionitis; Neuropathic pain; Chronic pain syndrome; Occipital headache; Bilateral occipital neuralgia; Herpes zoster ophthalmicus; Hypertension associated with diabetes ([place]); Primary osteoarthritis of left knee; Status post total left knee replacement; Primary osteoarthritis involving multiple joints; Microalbuminuria due to type 2 diabetes mellitus ([place]); Acute pain of both knees; Postnasal drip; Other viral warts; Change in bowel habits; Rectal irritation; Bilateral bunions; Special screening for malignant neoplasms, colon; Benign neoplasm of cecum; Hyperlipidemia associated with type 2 diabetes mellitus ([place]); Intertriginous candidiasis; Intractable chronic migraine without aura and without status migrainosus; and Microalbuminuria on their problem list. Today she comes in for evaluation  of her Facial Pain (Face/head)  Pain Assessment: Location:   Head Radiating: nose to right ear Onset: More than a month ago (11 years from shingles) Duration: Chronic pain Quality: Aching, Burning (itching, crawling feeling) Severity: 10-Worst pain ever/10 (subjective, self-reported pain score)  Effect on ADL: Hard to get up and go, Daily ADL's, sleeping Timing: Constant Modifying factors: Lyrica and Gabapentin $RemoveBefore'600mg'ZbwqMlwEzyYbS$  TID, not on Lyrica now BP: 125/81   HR: 92  Onset and Duration: Present longer than 3 months 2011 Cause of pain:  shingles Severity: Getting worse, NAS-11 at its worse: 10/10, NAS-11 at its best: 10/10, NAS-11 now: 10/10, and NAS-11 on the average: 10/10 Timing: Morning Aggravating Factors:  sun, wind, heat Alleviating Factors: Medications Associated Problems: Spasms, Swelling, Pain that wakes patient up, and Pain that does not allow patient to sleep Quality of Pain: Aching, Agonizing, Annoying, Burning, Constant, Disabling, Horrible, Hot, Itching, Nagging, Pulsating, Sharp, Shooting, Stabbing, Tender, Throbbing, Tingling, and Uncomfortable Previous Examinations or Tests: CT scan, MRI scan, Nerve block, and Neurological evaluation Previous Treatments: Trigeminal nerve block, lyrica, gabapentin This patient is known to our practice.  She was initially seen by's on 01/21/2016.  Her last appointment with [place] was on 10/29/2016 where I referred her to neurosurgery for evaluation only possible gasserian ganglion radiofrequency ablation versus a direct lesioning of the supraorbital and supra trochlear nerve.  The only interventional therapy that I had done for this patient was a diagnostic right-sided supraorbital and supratrochlear nerve block on 08/04/2016.  Review of initial evaluation on 01/21/2016: "The patient describes the primary pain to be over the right upper quadrant of her face in the distribution of the ophthalmic and perhaps some of the maxillary branch of the trigeminal  nerve on the right side. The patient also indicates that her secondary pain is that of occipital headaches which are bilateral. The patient indicates having had several  nerve blocks done at a [place] pain clinic."    According to care everywhere, she had some procedures done by Cleon Gustin, MD before my evaluation and Dr. Jennings Books after my evaluation.  According to these notes, the problem seems to have started as a herpes zoster ophthalmicus.  Completed by other providers:   Diagnostic right supraorbital and trochlear NB x1 (02/28/2015) by Cleon Gustin, MD  Diagnostic right sphenopalatine ganglion block x1 (04/10/2015) by Cleon Gustin, MD  Therapeutic right sphenopalatine ganglion + V2 NB x3 (08/02/2019, 08/09/2019, 08/16/2019) by Dr. Jennings Books   The patient returns to the clinic today after last time being seen on 10/29/2016 at which time we referred her to "neurosurgery" for possible right gasserian ganglion block with follow-up radiofrequency ablation.  Unfortunately, there appears to have been a misunderstanding and the patient was referred to "neurology".  Because the patient did not come back for a 15-month follow-up as requested during that visit, I was unable to identify the issue until now.  Today we will be again sending the patient for neurosurgical evaluation.  In addition I will be sending her for a medical psychology evaluation since he is having a lot of problems dealing with this condition and the fact that it is very likely to be permanent.  In reviewing her medications, she is currently taking gabapentin 600 mg, 2 tablets (1200 mg) 3 times daily and she refers she is not having any side effects but she also indicates that she is not getting relief of the pain.  She had indicated in the past having tried the Lyrica 50 mg, 1 tablet p.o. 4 times daily which at the time it did not seem to be very effective, but he was also not giving her any side effects.  She now feels  that that medication might have been more effective.  For this reason, today I am recommending to consider switching the patient to Lyrica 150 mg p.o. 3 times daily for a maximum daily dose of 450 mg to see if this works better than they gabapentin.  I have reminded the patient that it takes 3 weeks for adequate blood levels to be reached before the medication can be fully appropriately evaluated.  If by any chance she does not tolerate this switch and begins to experience some side effects to the higher dose of Lyrica, an alternative would be to start her back at 50 mg p.o. 4 times daily and to continue titrating it up until the 450 mg/day is reached.  A slower titration may allow for her body to get used to the medication and for side effects to be avoided.  Historic Controlled Substance Pharmacotherapy Review  PMP and historical list of controlled substances: Lyrica 50 mg capsule, 1 cap p.o. 4 times daily Current opioid analgesics: None MME/day: 0 mg/day  Historical Monitoring: The patient  reports no history of drug use. List of all UDS Test(s): No results found for: MDMA, COCAINSCRNUR, [place], [place], CANNABQUANT, THCU, [place] List of other Serum/Urine Drug Screening Test(s):  No results found for: AMPHSCRSER, BARBSCRSER, BENZOSCRSER, COCAINSCRSER, COCAINSCRNUR, PCPSCRSER, PCPQUANT, THCSCRSER, THCU, CANNABQUANT, OPIATESCRSER, OXYSCRSER, PROPOXSCRSER, ETH Historical Background Evaluation: [place] PMP: PDMP reviewed during this encounter. Online review of the past 57-month period conducted.             PMP NARX Score Report:  Narcotic: 020 Sedative: 040 Stimulant: 000 [place] Department of public safety, offender search: (Public Information) Non-contributory Risk Assessment Profile: Aberrant behavior: None observed or detected  today Risk factors for fatal opioid overdose: None identified today PMP NARX Overdose Risk Score: 110 Fatal overdose hazard ratio (HR): Calculation deferred Non-fatal  overdose hazard ratio (HR): Calculation deferred Risk of opioid abuse or dependence: 0.7-3.0% with doses ? 36 MME/day and 6.1-26% with doses ? 120 MME/day. Substance use disorder (SUD) risk level: See below Personal History of Substance Abuse (SUD-Substance use disorder):  Alcohol: Negative  Illegal Drugs: Negative  Rx Drugs: Negative  ORT Risk Level calculation: Low Risk  Opioid Risk Tool - 08/19/21 0906       Personal History of Substance Abuse   Alcohol Negative    Illegal Drugs Negative    Rx Drugs Negative      Psychological Disease   Psychological Disease Negative    Depression Negative      Total Score   Opioid Risk Tool Scoring 0    Opioid Risk Interpretation Low Risk            ORT Scoring interpretation table:  Score <3 = Low Risk for SUD  Score between 4-7 = Moderate Risk for SUD  Score >8 = High Risk for Opioid Abuse   PHQ-2 Depression Scale:  Total score: 0  PHQ-2 Scoring interpretation table: (Score and probability of major depressive disorder)  Score 0 = No depression  Score 1 = 15.4% Probability  Score 2 = 21.1% Probability  Score 3 = 38.4% Probability  Score 4 = 45.5% Probability  Score 5 = 56.4% Probability  Score 6 = 78.6% Probability   PHQ-9 Depression Scale:  Total score: 3  PHQ-9 Scoring interpretation table:  Score 0-4 = No depression  Score 5-9 = Mild depression  Score 10-14 = Moderate depression  Score 15-19 = Moderately severe depression  Score 20-27 = Severe depression (2.4 times higher risk of SUD and 2.89 times higher risk of overuse)   Pharmacologic Plan: As per protocol, I have not taken over any controlled substance management, pending the results of ordered tests and/or consults.            Initial impression: Pending review of available data and ordered tests.  Meds   Current Outpatient Medications:    albuterol (VENTOLIN HFA) 108 (90 Base) MCG/ACT inhaler, INHALE 1-2 PUFFS BY MOUTH EVERY 6 HOURS AS NEEDED FOR WHEEZE OR  SHORTNESS OF BREATH, Disp: 18 each, Rfl: 2   atorvastatin (LIPITOR) 80 MG tablet, Take 1 tablet (80 mg total) by mouth daily., Disp: 90 tablet, Rfl: 3   carboxymethylcellulose (REFRESH PLUS) 0.5 % SOLN, Apply 2 drops to eye 2 (two) times daily as needed., Disp: , Rfl:    fluticasone furoate-vilanterol (BREO ELLIPTA) 100-25 MCG/INH AEPB, Inhale 1 puff into the lungs 2 (two) times daily., Disp: 60 each, Rfl: 5   hydrochlorothiazide (HYDRODIURIL) 12.5 MG tablet, Take 1 tablet (12.5 mg total) by mouth daily., Disp: 90 tablet, Rfl: 3   hydrocortisone (ANUSOL-HC) 2.5 % rectal cream, Place 1 application rectally 2 (two) times daily., Disp: 60 g, Rfl: 5   lamoTRIgine (LAMICTAL) 25 MG tablet, Take 25 mg by mouth 2 (two) times daily., Disp: , Rfl:    losartan (COZAAR) 100 MG tablet, Take 1 tablet (100 mg total) by mouth daily., Disp: 90 tablet, Rfl: 3   omeprazole (PRILOSEC) 20 MG capsule, Take 1 capsule (20 mg total) by mouth daily., Disp: 90 capsule, Rfl: 3   Semaglutide,0.25 or 0.5MG /DOS, (OZEMPIC, 0.25 OR 0.5 MG/DOSE,) 2 MG/1.5ML SOPN, Inject 0.5 mg into the skin once a week., Disp: 4.5  mL, Rfl: 3   triamcinolone ointment (KENALOG) 0.5 %, Apply 1 application topically 2 (two) times daily., Disp: 30 g, Rfl: 0   VITAMIN D PO, Take by mouth daily., Disp: , Rfl:    gabapentin (NEURONTIN) 600 MG tablet, Take 2 tablets (1,200 mg total) by mouth 3 (three) times daily., Disp: 540 tablet, Rfl: 3   Insulin Pen Needle (B-D UF III MINI PEN NEEDLES) 31G X 5 MM MISC, Use as directed, Disp: 30 each, Rfl: 1   nystatin (MYCOSTATIN/NYSTOP) powder, Apply 1 application topically 3 (three) times daily., Disp: 60 g, Rfl: 0   pregabalin (LYRICA) 300 MG capsule, Take 1 capsule (300 mg total) by mouth 2 (two) times daily., Disp: 180 capsule, Rfl: 1  Imaging Review  Cervical Imaging: Cervical DG complete: Results for orders placed during the hospital encounter of 01/21/16 DG Cervical Spine Complete  Narrative CLINICAL DATA:   Pain.  EXAM: CERVICAL SPINE - COMPLETE 4+ VIEW  COMPARISON:  CT 04/16/2002.  CT 01/01/2008.  FINDINGS: Multilevel degenerative change cervical spine. Disc space loss with endplate osteophyte formation noted at C4-C5, C5-C6, C6-C7. Loss of normal cervical lordosis. No evidence of fracture or dislocation. Pulmonary apices are clear.  IMPRESSION: Diffuse degenerative change cervical spine with loss of normal cervical lordosis.   Electronically Signed By: Maisie Fus  Register On: 01/21/2016 13:21  Complexity Note: Imaging results reviewed. Results shared with [name], using Layman's terms.                        ROS  Cardiovascular: High blood pressure Pulmonary or Respiratory: Wheezing and difficulty taking a deep full breath (Asthma), Shortness of breath, Snoring , and Coughing up mucus (Bronchitis) Neurological: No reported neurological signs or symptoms such as seizures, abnormal skin sensations, urinary and/or fecal incontinence, being born with an abnormal open spine and/or a tethered spinal cord Psychological-Psychiatric: Difficulty sleeping and or falling asleep Gastrointestinal: No reported gastrointestinal signs or symptoms such as vomiting or evacuating blood, reflux, heartburn, alternating episodes of diarrhea and constipation, inflamed or scarred liver, or pancreas or irrregular and/or infrequent bowel movements Genitourinary: No reported renal or genitourinary signs or symptoms such as difficulty voiding or producing urine, peeing blood, non-functioning kidney, kidney stones, difficulty emptying the bladder, difficulty controlling the flow of urine, or chronic kidney disease Hematological: Brusing easily Endocrine: High blood sugar controlled without the use of insulin (NIDDM) Rheumatologic: No reported rheumatological signs and symptoms such as fatigue, joint pain, tenderness, swelling, redness, heat, stiffness, decreased range of motion, with or without associated  rash Musculoskeletal: Negative for myasthenia gravis, muscular dystrophy, multiple sclerosis or malignant hyperthermia Work History: Retired  Allergies  [name] is allergic to lisinopril, lovastatin, and shellfish allergy.  Laboratory Chemistry Profile   Renal Lab Results  Component Value Date   BUN 9 05/16/2021   CREATININE 0.97 05/16/2021   BCR 9 (L) 05/16/2021   GFRAA 61 08/06/2020   GFRNONAA 53 (L) 08/06/2020   PROTEINUR NEGATIVE 11/18/2016     Electrolytes Lab Results  Component Value Date   NA 140 05/16/2021   K 4.3 05/16/2021   CL 103 05/16/2021   CALCIUM 9.6 05/16/2021   MG 2.0 07/25/2016     Hepatic Lab Results  Component Value Date   AST 14 05/16/2021   ALT 11 05/16/2021   ALBUMIN 4.2 05/16/2021   ALKPHOS 93 05/16/2021     ID Lab Results  Component Value Date   SARSCOV2NAA POSITIVE (A) 07/09/2020  STAPHAUREUS NEGATIVE 11/18/2016   MRSAPCR NEGATIVE 11/18/2016     Bone Lab Results  Component Value Date   VD25OH 56.7 03/13/2020   25OHVITD1 32 07/25/2016   25OHVITD2 5.1 07/25/2016   25OHVITD3 27 07/25/2016     Endocrine Lab Results  Component Value Date   GLUCOSE 112 (H) 05/16/2021   GLUCOSEU NEGATIVE 11/18/2016   HGBA1C 6.0 (A) 05/16/2021   TSH 1.910 03/13/2020     Neuropathy Lab Results  Component Value Date   VITAMINB12 375 07/25/2016   HGBA1C 6.0 (A) 05/16/2021     CNS No results found for: COLORCSF, APPEARCSF, RBCCOUNTCSF, WBCCSF, POLYSCSF, LYMPHSCSF, EOSCSF, PROTEINCSF, GLUCCSF, JCVIRUS, CSFOLI, IGGCSF, LABACHR, ACETBL, LABACHR, ACETBL   Inflammation (CRP: Acute   ESR: Chronic) Lab Results  Component Value Date   CRP 1.7 (H) 11/18/2016   ESRSEDRATE 18 11/18/2016     Rheumatology No results found for: RF, ANA, LABURIC, URICUR, LYMEIGGIGMAB, LYMEABIGMQN, HLAB27   Coagulation Lab Results  Component Value Date   INR 0.98 11/18/2016   LABPROT 13.0 11/18/2016   APTT 27 11/18/2016   PLT 280 10/03/2020      Cardiovascular Lab Results  Component Value Date   CKTOTAL 128 03/28/2016   CKMB 1.3 10/11/2013   TROPONINI < 0.02 10/11/2013   HGB 13.5 10/03/2020   HCT 42.3 10/03/2020     Screening Lab Results  Component Value Date   SARSCOV2NAA POSITIVE (A) 07/09/2020   STAPHAUREUS NEGATIVE 11/18/2016   MRSAPCR NEGATIVE 11/18/2016     Cancer No results found for: CEA, CA125, LABCA2   Allergens No results found for: ALMOND, APPLE, ASPARAGUS, AVOCADO, BANANA, BARLEY, BASIL, BAYLEAF, GREENBEAN, LIMABEAN, WHITEBEAN, BEEFIGE, REDBEET, BLUEBERRY, BROCCOLI, CABBAGE, MELON, CARROT, CASEIN, CASHEWNUT, CAULIFLOWER, CELERY     Note: Lab results reviewed.  PFSH  Drug: [name]  reports no history of drug use. Alcohol:  reports no history of alcohol use. Tobacco:  reports that she has never smoked. She has never used smokeless tobacco. Medical:  has a past medical history of Anxiety, Arthritis, Asthma, Bronchitis (09/04/2016), Diabetes mellitus without complication ([place]), Elevated C-reactive protein (CRP) (10/06/2016), GERD (gastroesophageal reflux disease), History of shingles, Hyperlipidemia, Hypertension, Neuralgia, Shingles, and Wears dentures. Family: family history includes Anuerysm in her mother; CAD in her father; Dementia in her father and mother; Diabetes in her brother, father, and mother; Heart failure in her father; Lupus in her sister; Throat cancer in her maternal grandfather.  Past Surgical History:  Procedure Laterality Date   ABDOMINAL HYSTERECTOMY     BREAST BIOPSY Right 2016   benign- tophat clip   BREAST BIOPSY Left 04/08/2016   -benign-fibroadenomatous changes- tophat clip   BREAST LUMPECTOMY Right 03/2016   BREAST LUMPECTOMY WITH NEEDLE LOCALIZATION Right 05/02/2016   Procedure: BREAST LUMPECTOMY WITH NEEDLE LOCALIZATION;  Surgeon: Clayburn Pert, MD;  Location: [facility];  Service: General;  Laterality: Right;   CHOLECYSTECTOMY     COLONOSCOPY WITH PROPOFOL N/A 05/02/2019    Procedure: COLONOSCOPY WITH BIOPSY;  Surgeon: Lucilla Lame, MD;  Location: [facility];  Service: Endoscopy;  Laterality: N/A;  diabetic - diet controlled   KNEE ARTHROPLASTY Left 12/01/2016   Procedure: COMPUTER ASSISTED TOTAL KNEE ARTHROPLASTY;  Surgeon: Dereck Leep, MD;  Location: [facility];  Service: Orthopedics;  Laterality: Left;   POLYPECTOMY N/A 05/02/2019   Procedure: POLYPECTOMY;  Surgeon: Lucilla Lame, MD;  Location: [facility];  Service: Endoscopy;  Laterality: N/A;   TOTAL KNEE ARTHROPLASTY Right 2014   TOTAL KNEE ARTHROPLASTY Right 02/16/2013  Active Ambulatory Problems    Diagnosis Date Noted   Postherpetic neuralgia 11/22/2014   Depression 01/10/2015   Morbid obesity ([place]) 04/18/2014   Neuropathic postherpetic trigeminal neuralgia (1ry area of Pain) (Right) (V1) 12/21/2013   Anxiety 01/11/2015   Asthma 02/07/2009   Diabetes ([place]) 01/11/2015   Photopsia 01/11/2015   History of colon polyps 10/17/2005   Calcium deficiency disease 10/16/2008   Cannot sleep 03/13/2009   Hypercholesterolemia without hypertriglyceridemia 07/06/2008   Avitaminosis A 01/11/2015   Avitaminosis D 07/05/2008   Gasserian ganglionitis 03/12/2015   Neuropathic pain 03/12/2015   Chronic pain syndrome 05/07/2015   Occipital headache 01/21/2016   Bilateral occipital neuralgia 01/21/2016   Herpes zoster ophthalmicus 03/12/2015   Hypertension associated with diabetes ([place]) 07/24/2016   Primary osteoarthritis of left knee 09/07/2016   Status post total left knee replacement 12/01/2016   Primary osteoarthritis involving multiple joints 12/05/2016   Microalbuminuria due to type 2 diabetes mellitus ([place]) 03/26/2017   Acute pain of both knees 08/07/2017   Postnasal drip 11/04/2017   Other viral warts 11/04/2017   Change in bowel habits 01/29/2018   Rectal irritation 01/29/2018   Bilateral bunions 10/12/2018   Special screening for malignant neoplasms, colon    Benign neoplasm of  cecum 01/17/2020   Hyperlipidemia associated with type 2 diabetes mellitus ([place]) 03/06/2020   Intertriginous candidiasis 10/03/2020   Intractable chronic migraine without aura and without status migrainosus 08/04/2019   Microalbuminuria 03/26/2017   Resolved Ambulatory Problems    Diagnosis Date Noted   Chronic Atypical facial pain (Location of Primary Source of Pain) (Right) 05/07/2015   Encounter for long-term (current) use of medications 01/21/2016   Disturbance of skin sensation 01/21/2016   Breast mass, right 04/16/2016   Long term current use of opiate analgesic 07/24/2016   Long term prescription opiate use 07/24/2016   Opiate use 07/24/2016   Elevated C-reactive protein (CRP) 10/06/2016   Hypokalemia 10/06/2016   Rash 10/03/2020   Past Medical History:  Diagnosis Date   Arthritis    Bronchitis 09/04/2016   Diabetes mellitus without complication (H[facility])    GERD (gastroesophageal reflux disease)    History of shingles    Hyperlipidemia    Hypertension    Neuralgia    Shingles    Wears dentures    Constitutional Exam  General appearance: Well nourished, well developed, and well hydrated. In no apparent acute distress Vitals:   08/19/21 0853  BP: 125/81  Pulse: 92  Resp: 16  Temp: 97.6 F (36.4 C)  SpO2: 98%  Weight: 210 lb (95.3 kg)  Height: $Remove'5\' 3"'QumqYRY$  (1.6 m)   BMI Assessment: Estimated body mass index is 37.2 kg/m as calculated from the following:   Height as of this encounter: $RemoveBeforeD'5\' 3"'[id number]$  (1.6 m).   Weight as of this encounter: 210 lb (95.3 kg).  BMI interpretation table: BMI level Category Range association with higher incidence of chronic pain  <18 kg/m2 Underweight   18.5-24.9 kg/m2 Ideal body weight   25-29.9 kg/m2 Overweight Increased incidence by 20%  30-34.9 kg/m2 Obese (Class I) Increased incidence by 68%  35-39.9 kg/m2 Severe obesity (Class II) Increased incidence by 136%  >40 kg/m2 Extreme obesity (Class III) Increased incidence by 254%   Patient's  current BMI Ideal Body weight  Body mass index is 37.2 kg/m. Ideal body weight: 52.4 kg (115 lb 8.3 oz) Adjusted ideal body weight: 69.5 kg (153 lb 5 oz)   BMI Readings from Last 4 Encounters:  08/19/21 37.20 kg/m  07/09/21 37.89 kg/m  05/16/21 38.03 kg/m  11/06/20 40.97 kg/m   Wt Readings from Last 4 Encounters:  08/19/21 210 lb (95.3 kg)  07/09/21 213 lb 14.4 oz (97 kg)  05/16/21 214 lb 11.2 oz (97.4 kg)  11/06/20 224 lb (101.6 kg)    Psych/Mental status: Alert, oriented x 3 (person, place, & time)       Eyes: PERLA Respiratory: No evidence of acute respiratory distress  Assessment  Primary Diagnosis & Pertinent Problem List: The primary encounter diagnosis was Chronic pain syndrome. Diagnoses of Neuropathic postherpetic trigeminal neuralgia (Location of Primary Source of Pain) (Right) (V1), Gasserian ganglionitis, Postherpetic neuralgia, and Neuropathic pain were also pertinent to this visit.  Visit Diagnosis (New problems to examiner): 1. Chronic pain syndrome   2. Neuropathic postherpetic trigeminal neuralgia (Location of Primary Source of Pain) (Right) (V1)   3. Gasserian ganglionitis   4. Postherpetic neuralgia   5. Neuropathic pain    Plan of Care (Initial workup plan)  Note: [name] was reminded that as per protocol, today's visit has been an evaluation only. We have not taken over the patient's controlled substance management.  Problem-specific plan: No problem-specific Assessment & Plan notes found for this encounter.  Lab Orders  No laboratory test(s) ordered today   Imaging Orders  No imaging studies ordered today   Referral Orders         Ambulatory referral to Neurosurgery         Ambulatory referral to Psychology     Procedure Orders    No procedure(s) ordered today   Pharmacotherapy (current): Medications ordered:  No orders of the defined types were placed in this encounter.  Medications administered during this visit: [name]  had no medications administered during this visit.   Pharmacological management options:  Opioid Analgesics: The patient was informed that there is no guarantee that she would be a candidate for opioid analgesics. The decision will be made following CDC guidelines. This decision will be based on the results of diagnostic studies, as well as [name]'s risk profile.   Membrane stabilizer:  Currently the patient is taking gabapentin (Neurontin) 600 mg, 2 tabs (1200 mg) p.o. 3 times daily.  She refers that occasionally she takes a fourth dose per day.  However, she also seems to think that when she was on the Lyrica, she was doing somewhat better.  However, when I checked on the dose, she was taking pregabalin (Lyrica) 50 mg 1 tablet p.o. 4 times daily.  An alternative here would be to switch the patient to Lyrica 150 mg p.o. 3 times daily.  If she does not tolerate the switch, then I would recommend going back to the Lyrica 50 mg 4 times daily and slowly continue to titrate it up until she gets to a total of 450 mg/day.  Muscle relaxant: To be determined at a later time  NSAID: To be determined at a later time  Other analgesic(s): To be determined at a later time   Interventional management options: [name] was informed that there is no guarantee that she would be a candidate for interventional therapies. The decision will be based on the results of diagnostic studies, as well as [name]'s risk profile.  Procedure(s) under consideration:  Pending results of ordered studies      Interventional Therapies  Risk   Complexity Considerations:   Estimated body mass index is 37.2 kg/m as calculated from the following:   Height as of this encounter:  $'5\' 3"'U$  (1.6 m).   Weight as of this encounter: 210 lb (95.3 kg). WNL   Planned   Pending:   Referral to neurosurgery for possible gasserian ganglion block, followed by radiofrequency ablation or gamma ray treatment. Referral to medical psychology   Consider switching the patient from gabapentin to pregabalin   Under consideration:   Diagnostic gasserian ganglion block under CT guidance #1    Completed:   Diagnostic right supraorbital and supratrochlear NB x1 (08/04/2016)    Completed by other providers:   Diagnostic right supraorbital and trochlear NB x1 (02/28/2015) by Cleon Gustin, MD [facility])  Diagnostic right sphenopalatine ganglion block x1 (04/10/2015) by Cleon Gustin, MD [facility])  Therapeutic right sphenopalatine ganglion + V2 NB x3 (08/02/2019, 08/09/2019, 08/16/2019) by Dr. Jennings [facility])    Therapeutic   Palliative (PRN) options:   None established    Provider-requested follow-up: Return if symptoms worsen or fail to improve.  Future Appointments  Date Time Provider [place]  08/20/2021 11:00 AM [facility]  11/14/2021 10:20 AM Brita Romp Dionne Bucy, MD [facility]  07/10/2022  9:00 AM [facility]-NURSE HEALTH ADVISOR [facility]    Note by: Gaspar Cola, MD Date: 08/19/2021; Time: 11:14 AM

## 2021-08-19 ENCOUNTER — Other Ambulatory Visit: Payer: Self-pay

## 2021-08-19 ENCOUNTER — Encounter: Payer: Self-pay | Admitting: Pain Medicine

## 2021-08-19 ENCOUNTER — Ambulatory Visit: Payer: Medicare PPO | Attending: Pain Medicine | Admitting: Pain Medicine

## 2021-08-19 VITALS — BP 125/81 | HR 92 | Temp 97.6°F | Resp 16 | Ht 63.0 in | Wt 210.0 lb

## 2021-08-19 DIAGNOSIS — G500 Trigeminal neuralgia: Secondary | ICD-10-CM | POA: Diagnosis not present

## 2021-08-19 DIAGNOSIS — G894 Chronic pain syndrome: Secondary | ICD-10-CM | POA: Diagnosis not present

## 2021-08-19 DIAGNOSIS — M792 Neuralgia and neuritis, unspecified: Secondary | ICD-10-CM | POA: Insufficient documentation

## 2021-08-19 DIAGNOSIS — B0229 Other postherpetic nervous system involvement: Secondary | ICD-10-CM | POA: Diagnosis not present

## 2021-08-19 DIAGNOSIS — B0222 Postherpetic trigeminal neuralgia: Secondary | ICD-10-CM | POA: Insufficient documentation

## 2021-08-19 NOTE — Progress Notes (Signed)
Safety precautions to be maintained throughout the outpatient stay will include: orient to surroundings, keep bed in low position, maintain call bell within reach at all times, provide assistance with transfer out of bed and ambulation.  

## 2021-08-20 ENCOUNTER — Ambulatory Visit
Admission: RE | Admit: 2021-08-20 | Discharge: 2021-08-20 | Disposition: A | Discharge status: Home / Self Care | Payer: Medicare PPO | Source: Ambulatory Visit | Attending: Family Medicine | Admitting: Family Medicine

## 2021-08-20 DIAGNOSIS — Z780 Asymptomatic menopausal state: Secondary | ICD-10-CM | POA: Insufficient documentation

## 2021-08-20 DIAGNOSIS — Z7951 Long term (current) use of inhaled steroids: Secondary | ICD-10-CM | POA: Diagnosis not present

## 2021-08-29 DIAGNOSIS — B0229 Other postherpetic nervous system involvement: Secondary | ICD-10-CM | POA: Diagnosis not present

## 2021-08-29 DIAGNOSIS — B0222 Postherpetic trigeminal neuralgia: Secondary | ICD-10-CM | POA: Diagnosis not present

## 2021-08-29 DIAGNOSIS — Z79899 Other long term (current) drug therapy: Secondary | ICD-10-CM | POA: Diagnosis not present

## 2021-08-29 LAB — CBC AND DIFFERENTIAL
HCT: 43 (ref 36–46)
Hemoglobin: 13.4 (ref 12.0–16.0)
Neutrophils Absolute: 6.73
Platelets: 263 10*3/uL (ref 150–400)
WBC: 10.8

## 2021-08-29 LAB — HEPATIC FUNCTION PANEL
ALT: 8 U/L (ref 7–35)
AST: 10 — AB (ref 13–35)
Alkaline Phosphatase: 79 (ref 25–125)
Bilirubin, Total: 0.6

## 2021-08-29 LAB — BASIC METABOLIC PANEL
BUN: 10 (ref 4–21)
CO2: 30 — AB (ref 13–22)
Chloride: 103 (ref 99–108)
Creatinine: 1 (ref 0.5–1.1)
Glucose: 78
Potassium: 4.2 mEq/L (ref 3.5–5.1)
Sodium: 140 (ref 137–147)

## 2021-08-29 LAB — CBC: RBC: 5 (ref 3.87–5.11)

## 2021-08-29 LAB — COMPREHENSIVE METABOLIC PANEL
Albumin: 3.9 (ref 3.5–5.0)
Calcium: 9.5 (ref 8.7–10.7)
eGFR: 66

## 2021-09-02 ENCOUNTER — Other Ambulatory Visit: Payer: Self-pay | Admitting: Family Medicine

## 2021-09-02 DIAGNOSIS — B0229 Other postherpetic nervous system involvement: Secondary | ICD-10-CM

## 2021-09-02 DIAGNOSIS — M792 Neuralgia and neuritis, unspecified: Secondary | ICD-10-CM

## 2021-09-02 NOTE — Telephone Encounter (Signed)
Medication Refill - Medication:  ? ?gabapentin (NEURONTIN) 600 MG tablet ? ?Has the patient contacted their pharmacy? Yes.  Contact pcp, no refills on file.  ?(Agent: If no, request that the patient contact the pharmacy for the refill. If patient does not wish to contact the pharmacy document the reason why and proceed with request.) ?(Agent: If yes, when and what did the pharmacy advise?) ? ?Preferred Pharmacy (with phone number or street name):  ? ?[facility] - [place], [place]  ?[address] [place] [address]  ?Phone: [phone] Fax: [phone]  ? ?Has the patient been seen for an appointment in the last year OR does the patient have an upcoming appointment? Yes. ? ?Agent: Please be advised that RX refills may take up to 3 business days. We ask that you follow-up with your pharmacy. ? ?

## 2021-09-04 MED ORDER — GABAPENTIN 600 MG PO TABS: 1200.0000 mg | ORAL_TABLET | Freq: Three times a day (TID) | ORAL | 3 refills | Status: DC

## 2021-09-04 NOTE — Telephone Encounter (Signed)
Requested medications are due for refill today.  yes ? ?Requested medications are on the active medications list.  yes ? ?Last refill. 08/06/2020 #540 3 refills ? ?Future visit scheduled.   yes ? ?Notes to clinic.  Rx expired 08/01/2021.  ? ? ? ?Requested Prescriptions  ?Pending Prescriptions Disp Refills  ? gabapentin (NEURONTIN) 600 MG tablet 540 tablet 3  ?  Sig: Take 2 tablets (1,200 mg total) by mouth 3 (three) times daily.  ?  ? Neurology: Anticonvulsants - gabapentin Passed - 09/02/2021  5:28 PM  ?  ?  Passed - Cr in normal range and within 360 days  ?  Creat  ?Date Value Ref Range Status  ?03/26/2017 0.96 0.50 - 0.99 mg/dL Final  ?  Comment:  ?  For patients >79 years of age, the reference limit ?for Creatinine is approximately 13% higher for people ?identified as African-American. ?. ?  ? ?Creatinine, Ser  ?Date Value Ref Range Status  ?05/16/2021 0.97 0.57 - 1.00 mg/dL Final  ?  ?  ?  ?  Passed - Completed PHQ-2 or PHQ-9 in the last 360 days  ?  ?  Passed - Valid encounter within last 12 months  ?  Recent Outpatient Visits   ? ?      ? 3 months ago Type 2 diabetes mellitus with other specified complication, without long-term current use of insulin ([place])  ? [facility] [place], Dionne Bucy, MD  ? 10 months ago Type 2 diabetes mellitus with other specified complication, without long-term current use of insulin ([place])  ? [facility] [place], Dionne Bucy, MD  ? 10 months ago Itching  ? [employer] Just, Laurita Quint, FNP  ? 11 months ago Rash  ? [place], FNP  ? 12 months ago Type 2 diabetes mellitus with other specified complication, without long-term current use of insulin ([place])  ? [facility] Bacigalupo, Dionne Bucy, MD  ? ?  ?  ?Future Appointments   ? ?        ? In 2 months Bacigalupo, Dionne Bucy, MD [facility]  ? ?  ? ?  ?  ?  ?  ?

## 2021-11-12 NOTE — Progress Notes (Unsigned)
I,[name],acting as a Education administrator for Sarah Paganini, MD.,have documented all relevant documentation on the behalf of Sarah Paganini, MD,as directed by  Sarah Paganini, MD while in the presence of Sarah Paganini, MD.   Established patient visit   Patient: [name]   DOB: 1950/12/14   70 y.o. Female  MRN: [id number] Visit Date: 11/14/2021  Today's healthcare provider: Lavon Paganini, MD   Chief Complaint  Patient presents with   Diabetes   Hypertension   Hyperlipidemia   Subjective    HPI Patient had AWV done 07/09/2021  Patient C/O of worsening trigeminal neuralgia. She reports gabapentin is no longer helping with pain. Patient is requesting medication to help with pain and sleep.   Diabetes Mellitus Type II, follow-up  Lab Results  Component Value Date   HGBA1C 6.0 (A) 11/14/2021   HGBA1C 6.0 (A) 05/16/2021   HGBA1C 5.7 (A) 11/06/2020   Last seen for diabetes 6 months ago.  Management since then includes continuing the same treatment. She reports excellent compliance with treatment.  She is not having side effects.   Home blood sugar records:  not being checked  Episodes of hypoglycemia? No    Current insulin regiment: none Most Recent Eye Exam: 04/02/2021  --------------------------------------------------------------------------------------------------- Microalbuminuria due to type 2 diabetes mellitus: On Losartan. Hypertension, follow-up  BP Readings from Last 3 Encounters:  11/14/21 115/79  08/19/21 125/81  07/09/21 132/80   Wt Readings from Last 3 Encounters:  11/14/21 212 lb 8 oz (96.4 kg)  08/19/21 210 lb (95.3 kg)  07/09/21 213 lb 14.4 oz (97 kg)     She was last seen for hypertension 6 months ago.  BP at that visit was 130/72. Management since that visit includes continue current medication. She reports excellent compliance with treatment. Has been out of medication for about 2 months. She is not having side effects.   She is exercising. She is adherent to low salt diet.   Outside blood pressures are .  She does not smoke.  --------------------------------------------------------------------------------------------------- Lipid/Cholesterol, follow-up  Last Lipid Panel: Lab Results  Component Value Date   CHOL 137 05/16/2021   LDLCALC 76 05/16/2021   HDL 41 05/16/2021   TRIG 112 05/16/2021    She was last seen for this 6 months ago.  Management since that visit includes Continue Statin.  She reports excellent compliance with treatment. She is not having side effects.   Symptoms: No appetite changes No foot ulcerations  No chest pain No chest pressure/discomfort  No dyspnea No orthopnea  No fatigue Yes lower extremity edema  No palpitations No paroxysmal nocturnal dyspnea  No nausea No numbness or tingling of extremity  No polydipsia No polyuria  No speech difficulty No syncope   She is following a Regular diet. Current exercise: walking  Last metabolic panel Lab Results  Component Value Date   GLUCOSE 112 (H) 05/16/2021   NA 140 08/29/2021   K 4.2 08/29/2021   BUN 10 08/29/2021   CREATININE 1.0 08/29/2021   EGFR 66 08/29/2021   GFRNONAA 53 (L) 08/06/2020   CALCIUM 9.5 08/29/2021   AST 10 (A) 08/29/2021   ALT 8 08/29/2021   The 10-year ASCVD risk score (Arnett DK, et al., 2019) is: 16.9%  ---------------------------------------------------------------------------------------------------   Medications: Outpatient Medications Prior to Visit  Medication Sig   albuterol (VENTOLIN HFA) 108 (90 Base) MCG/ACT inhaler INHALE 1-2 PUFFS BY MOUTH EVERY 6 HOURS AS NEEDED FOR WHEEZE OR SHORTNESS OF BREATH   carboxymethylcellulose (  REFRESH PLUS) 0.5 % SOLN Apply 2 drops to eye 2 (two) times daily as needed.   fluticasone furoate-vilanterol (BREO ELLIPTA) 100-25 MCG/INH AEPB Inhale 1 puff into the lungs 2 (two) times daily.   hydrocortisone (ANUSOL-HC) 2.5 % rectal cream Place 1  application rectally 2 (two) times daily.   Insulin Pen Needle (B-D UF III MINI PEN NEEDLES) 31G X 5 MM MISC Use as directed   lamoTRIgine (LAMICTAL) 25 MG tablet Take 25 mg by mouth 2 (two) times daily.   nystatin (MYCOSTATIN/NYSTOP) powder Apply 1 application topically 3 (three) times daily.   triamcinolone ointment (KENALOG) 0.5 % Apply 1 application topically 2 (two) times daily.   VITAMIN D PO Take by mouth daily.   [DISCONTINUED] atorvastatin (LIPITOR) 80 MG tablet Take 1 tablet (80 mg total) by mouth daily.   [DISCONTINUED] gabapentin (NEURONTIN) 600 MG tablet Take 2 tablets (1,200 mg total) by mouth 3 (three) times daily.   [DISCONTINUED] hydrochlorothiazide (HYDRODIURIL) 12.5 MG tablet Take 1 tablet (12.5 mg total) by mouth daily.   [DISCONTINUED] losartan (COZAAR) 100 MG tablet Take 1 tablet (100 mg total) by mouth daily.   [DISCONTINUED] omeprazole (PRILOSEC) 20 MG capsule Take 1 capsule (20 mg total) by mouth daily.   [DISCONTINUED] Semaglutide,0.25 or 0.5MG/DOS, (OZEMPIC, 0.25 OR 0.5 MG/DOSE,) 2 MG/1.5ML SOPN Inject 0.5 mg into the skin once a week.   [DISCONTINUED] pregabalin (LYRICA) 300 MG capsule Take 1 capsule (300 mg total) by mouth 2 (two) times daily.   No facility-administered medications prior to visit.    Review of Systems  Constitutional:  Negative for appetite change and fatigue.  Respiratory:  Negative for cough and shortness of breath.   Cardiovascular:  Positive for leg swelling. Negative for chest pain and palpitations.  Neurological:  Positive for headaches.  Psychiatric/Behavioral:  Positive for sleep disturbance.        Objective    BP 115/79 (BP Location: Left Arm, Patient Position: Sitting, Cuff Size: Large)   Pulse 80   Temp 98.4 F (36.9 C) (Oral)   Resp 16   Wt 212 lb 8 oz (96.4 kg)   BMI 37.64 kg/m  BP Readings from Last 3 Encounters:  11/14/21 115/79  08/19/21 125/81  07/09/21 132/80   Wt Readings from Last 3 Encounters:  11/14/21 212  lb 8 oz (96.4 kg)  08/19/21 210 lb (95.3 kg)  07/09/21 213 lb 14.4 oz (97 kg)      Physical Exam Vitals reviewed.  Constitutional:      General: She is not in acute distress.    Appearance: Normal appearance. She is well-developed. She is not diaphoretic.  HENT:     Head: Normocephalic and atraumatic.  Eyes:     General: No scleral icterus.    Conjunctiva/sclera: Conjunctivae normal.  Neck:     Thyroid: No thyromegaly.  Cardiovascular:     Rate and Rhythm: Normal rate and regular rhythm.     Pulses: Normal pulses.     Heart sounds: Normal heart sounds. No murmur heard. Pulmonary:     Effort: Pulmonary effort is normal. No respiratory distress.     Breath sounds: Normal breath sounds. No wheezing, rhonchi or rales.  Musculoskeletal:     Cervical back: Neck supple.     Right lower leg: No edema.     Left lower leg: No edema.  Lymphadenopathy:     Cervical: No cervical adenopathy.  Skin:    General: Skin is warm and dry.  Neurological:  Mental Status: She is alert and oriented to person, place, and time. Mental status is at baseline.  Psychiatric:        Mood and Affect: Mood normal.        Behavior: Behavior normal.      Results for orders placed or performed in visit on 11/14/21  CBC and differential  Result Value Ref Range   Hemoglobin 13.4 12.0 - 16.0   HCT 43 36 - 46   Neutrophils Absolute 6.73    Platelets 263 150 - 400 K/uL   WBC 10.8   CBC  Result Value Ref Range   RBC 5 3.87 - 9.79  Basic metabolic panel  Result Value Ref Range   Glucose 78    BUN 10 4 - 21   CO2 30 (A) 13 - 22   Creatinine 1.0 0.5 - 1.1   Potassium 4.2 3.5 - 5.1 mEq/L   Sodium 140 137 - 147   Chloride 103 99 - 108  Comprehensive metabolic panel  Result Value Ref Range   eGFR 66    Calcium 9.5 8.7 - 10.7   Albumin 3.9 3.5 - 5.0  Hepatic function panel  Result Value Ref Range   Alkaline Phosphatase 79 25 - 125   ALT 8 7 - 35 U/L   AST 10 (A) 13 - 35   Bilirubin, Total 0.6    POCT glycosylated hemoglobin (Hb A1C)  Result Value Ref Range   Hemoglobin A1C 6.0 (A) 4.0 - 5.6 %   Est. average glucose Bld gHb Est-mCnc 126     Assessment & Plan     Problem List Items Addressed This Visit       Cardiovascular and Mediastinum   Hypertension associated with diabetes ([place])    Well controlled Continue current medications Recheck metabolic panel F/u in 6 months       Relevant Medications   atorvastatin (LIPITOR) 80 MG tablet   hydrochlorothiazide (HYDRODIURIL) 12.5 MG tablet   losartan (COZAAR) 100 MG tablet   Semaglutide,0.25 or 0.5MG/DOS, (OZEMPIC, 0.25 OR 0.5 MG/DOSE,) 2 MG/1.5ML SOPN   Other Relevant Orders   Basic Metabolic Panel (BMET)     Endocrine   Diabetes ([place]) - Primary    Chronic, well controlled Complicated by HTN, HLD, microalbuminuria No changes to medications Up-to-date on screenings Follow-up in 6 months and repeat A1c       Relevant Medications   atorvastatin (LIPITOR) 80 MG tablet   losartan (COZAAR) 100 MG tablet   Semaglutide,0.25 or 0.5MG/DOS, (OZEMPIC, 0.25 OR 0.5 MG/DOSE,) 2 MG/1.5ML SOPN   Other Relevant Orders   POCT glycosylated hemoglobin (Hb A1C) (Completed)   Microalbumin / creatinine urine ratio   Microalbuminuria due to type 2 diabetes mellitus (HCC)    Chronic Continue losartan Recheck UACR       Relevant Medications   atorvastatin (LIPITOR) 80 MG tablet   losartan (COZAAR) 100 MG tablet   Semaglutide,0.25 or 0.5MG/DOS, (OZEMPIC, 0.25 OR 0.5 MG/DOSE,) 2 MG/1.5ML SOPN   Hyperlipidemia associated with type 2 diabetes mellitus (HCC)    Previously well controlled Continue statin Repeat FLP and CMP annually       Relevant Medications   atorvastatin (LIPITOR) 80 MG tablet   hydrochlorothiazide (HYDRODIURIL) 12.5 MG tablet   losartan (COZAAR) 100 MG tablet   Semaglutide,0.25 or 0.5MG/DOS, (OZEMPIC, 0.25 OR 0.5 MG/DOSE,) 2 MG/1.5ML SOPN     Nervous and Auditory   Postherpetic neuralgia (Chronic)     Chronic pain resulting from postherpetic neuralgia  Followed by neurology and pain management Pain management recommended that she talk to me today about switching from gabapentin to Lyrica, which we will do We will switch back to Lyrica 300 mg twice daily which she was previously taking as she is currently on a high dose of gabapentin as well She was referred to neurosurgery, so I did give her the phone number to call and possibly set up an appointment She is considering a second opinion with neurology at [facility], so referral was placed today       Relevant Orders   Ambulatory referral to Neurology     Other   Morbid obesity ([place])    BMI 37 and associated with T2DM, HTN, HLD Discussed importance of healthy weight management Discussed diet and exercise       Relevant Medications   Semaglutide,0.25 or 0.5MG/DOS, (OZEMPIC, 0.25 OR 0.5 MG/DOSE,) 2 MG/1.5ML SOPN   Other Visit Diagnoses     Essential hypertension       Relevant Medications   atorvastatin (LIPITOR) 80 MG tablet   hydrochlorothiazide (HYDRODIURIL) 12.5 MG tablet   losartan (COZAAR) 100 MG tablet        Return in about 6 months (around 05/16/2022) for CPE.      I, Sarah Paganini, MD, have reviewed all documentation for this visit. The documentation on 11/14/21 for the exam, diagnosis, procedures, and orders are all accurate and complete.   Bacigalupo, Dionne Bucy, MD, MPH [facility]

## 2021-11-14 ENCOUNTER — Ambulatory Visit (INDEPENDENT_AMBULATORY_CARE_PROVIDER_SITE_OTHER): Payer: Medicare PPO | Admitting: Family Medicine

## 2021-11-14 ENCOUNTER — Encounter: Payer: Self-pay | Admitting: Family Medicine

## 2021-11-14 VITALS — BP 115/79 | HR 80 | Temp 98.4°F | Resp 16 | Wt 212.5 lb

## 2021-11-14 DIAGNOSIS — E785 Hyperlipidemia, unspecified: Secondary | ICD-10-CM

## 2021-11-14 DIAGNOSIS — E1159 Type 2 diabetes mellitus with other circulatory complications: Secondary | ICD-10-CM | POA: Diagnosis not present

## 2021-11-14 DIAGNOSIS — E1169 Type 2 diabetes mellitus with other specified complication: Secondary | ICD-10-CM | POA: Diagnosis not present

## 2021-11-14 DIAGNOSIS — E1129 Type 2 diabetes mellitus with other diabetic kidney complication: Secondary | ICD-10-CM | POA: Diagnosis not present

## 2021-11-14 DIAGNOSIS — R809 Proteinuria, unspecified: Secondary | ICD-10-CM

## 2021-11-14 DIAGNOSIS — I10 Essential (primary) hypertension: Secondary | ICD-10-CM

## 2021-11-14 DIAGNOSIS — I152 Hypertension secondary to endocrine disorders: Secondary | ICD-10-CM

## 2021-11-14 DIAGNOSIS — B0229 Other postherpetic nervous system involvement: Secondary | ICD-10-CM

## 2021-11-14 LAB — POCT GLYCOSYLATED HEMOGLOBIN (HGB A1C)
Est. average glucose Bld gHb Est-mCnc: 126
Hemoglobin A1C: 6 % — AB (ref 4.0–5.6)

## 2021-11-14 MED ORDER — ATORVASTATIN CALCIUM 80 MG PO TABS: 80.0000 mg | ORAL_TABLET | Freq: Every day | ORAL | 3 refills | Status: DC

## 2021-11-14 MED ORDER — HYDROCHLOROTHIAZIDE 12.5 MG PO TABS: 12.5000 mg | ORAL_TABLET | Freq: Every day | ORAL | 3 refills | Status: DC

## 2021-11-14 MED ORDER — OZEMPIC (0.25 OR 0.5 MG/DOSE) 2 MG/1.5ML ~~LOC~~ SOPN
0.5000 mg | PEN_INJECTOR | SUBCUTANEOUS | 3 refills | Status: DC
Start: 2021-11-14 — End: 2022-11-11

## 2021-11-14 MED ORDER — LOSARTAN POTASSIUM 100 MG PO TABS: 100.0000 mg | ORAL_TABLET | Freq: Every day | ORAL | 3 refills | Status: DC

## 2021-11-14 MED ORDER — PREGABALIN 300 MG PO CAPS: 300.0000 mg | ORAL_CAPSULE | Freq: Two times a day (BID) | ORAL | 1 refills | Status: DC

## 2021-11-14 MED ORDER — OMEPRAZOLE 20 MG PO CPDR: 20.0000 mg | DELAYED_RELEASE_CAPSULE | Freq: Every day | ORAL | 3 refills | Status: DC

## 2021-11-14 NOTE — Assessment & Plan Note (Signed)
Previously well controlled Continue statin Repeat FLP and CMP annually 

## 2021-11-14 NOTE — Assessment & Plan Note (Signed)
Chronic pain resulting from postherpetic neuralgia Followed by neurology and pain management Pain management recommended that she talk to me today about switching from gabapentin to Lyrica, which we will do We will switch back to Lyrica 300 mg twice daily which she was previously taking as she is currently on a high dose of gabapentin as well She was referred to neurosurgery, so I did give her the phone number to call and possibly set up an appointment She is considering a second opinion with neurology at [facility], so referral was placed today

## 2021-11-14 NOTE — Assessment & Plan Note (Signed)
Well controlled Continue current medications Recheck metabolic panel F/u in 6 months  

## 2021-11-14 NOTE — Assessment & Plan Note (Signed)
BMI 37 and associated with T2DM, HTN, HLD Discussed importance of healthy weight management Discussed diet and exercise

## 2021-11-14 NOTE — Assessment & Plan Note (Signed)
Chronic, well controlled Complicated by HTN, HLD, microalbuminuria No changes to medications Up-to-date on screenings Follow-up in 6 months and repeat A1c

## 2021-11-14 NOTE — Assessment & Plan Note (Signed)
Chronic Continue losartan Recheck UACR

## 2021-11-15 LAB — MICROALBUMIN / CREATININE URINE RATIO
Creatinine, Urine: 210.8 mg/dL
Microalb/Creat Ratio: 4 mg/g creat (ref 0–29)
Microalbumin, Urine: 9.2 ug/mL

## 2021-11-15 LAB — BASIC METABOLIC PANEL
BUN/Creatinine Ratio: 10 — ABNORMAL LOW (ref 12–28)
BUN: 9 mg/dL (ref 8–27)
CO2: 20 mmol/L (ref 20–29)
Calcium: 9.1 mg/dL (ref 8.7–10.3)
Chloride: 104 mmol/L (ref 96–106)
Creatinine, Ser: 0.87 mg/dL (ref 0.57–1.00)
Glucose: 107 mg/dL — ABNORMAL HIGH (ref 70–99)
Potassium: 3.9 mmol/L (ref 3.5–5.2)
Sodium: 140 mmol/L (ref 134–144)
eGFR: 71 mL/min/{1.73_m2} (ref >59)

## 2021-12-02 DIAGNOSIS — S335XXA Sprain of ligaments of lumbar spine, initial encounter: Secondary | ICD-10-CM | POA: Diagnosis not present

## 2021-12-02 DIAGNOSIS — M9961 Osseous and subluxation stenosis of intervertebral foramina of cervical region: Secondary | ICD-10-CM | POA: Diagnosis not present

## 2021-12-02 DIAGNOSIS — S139XXA Sprain of joints and ligaments of unspecified parts of neck, initial encounter: Secondary | ICD-10-CM | POA: Diagnosis not present

## 2021-12-02 DIAGNOSIS — S161XXA Strain of muscle, fascia and tendon at neck level, initial encounter: Secondary | ICD-10-CM | POA: Diagnosis not present

## 2021-12-02 DIAGNOSIS — Z043 Encounter for examination and observation following other accident: Secondary | ICD-10-CM | POA: Diagnosis not present

## 2021-12-02 DIAGNOSIS — Z96659 Presence of unspecified artificial knee joint: Secondary | ICD-10-CM | POA: Diagnosis not present

## 2021-12-02 DIAGNOSIS — R937 Abnormal findings on diagnostic imaging of other parts of musculoskeletal system: Secondary | ICD-10-CM | POA: Diagnosis not present

## 2021-12-02 DIAGNOSIS — I10 Essential (primary) hypertension: Secondary | ICD-10-CM | POA: Diagnosis not present

## 2021-12-02 DIAGNOSIS — S199XXA Unspecified injury of neck, initial encounter: Secondary | ICD-10-CM | POA: Diagnosis not present

## 2021-12-02 DIAGNOSIS — M19011 Primary osteoarthritis, right shoulder: Secondary | ICD-10-CM | POA: Diagnosis not present

## 2021-12-02 DIAGNOSIS — S46011A Strain of muscle(s) and tendon(s) of the rotator cuff of right shoulder, initial encounter: Secondary | ICD-10-CM | POA: Diagnosis not present

## 2021-12-02 DIAGNOSIS — R42 Dizziness and giddiness: Secondary | ICD-10-CM | POA: Diagnosis not present

## 2021-12-02 DIAGNOSIS — M4802 Spinal stenosis, cervical region: Secondary | ICD-10-CM | POA: Diagnosis not present

## 2021-12-04 ENCOUNTER — Ambulatory Visit: Payer: Self-pay
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
## 2021-12-05 NOTE — Telephone Encounter (Signed)
Patient scheduled for tomorrow

## 2021-12-05 NOTE — Telephone Encounter (Signed)
Would need to be seen and evaluated to refer to PT. Can be virtual or in person.
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
## 2021-12-06 ENCOUNTER — Telehealth (INDEPENDENT_AMBULATORY_CARE_PROVIDER_SITE_OTHER): Payer: Medicare PPO | Admitting: Family Medicine

## 2021-12-06 ENCOUNTER — Encounter: Payer: Self-pay | Admitting: Family Medicine

## 2021-12-06 DIAGNOSIS — S139XXA Sprain of joints and ligaments of unspecified parts of neck, initial encounter: Secondary | ICD-10-CM

## 2021-12-06 DIAGNOSIS — S335XXA Sprain of ligaments of lumbar spine, initial encounter: Secondary | ICD-10-CM

## 2021-12-06 DIAGNOSIS — G8929 Other chronic pain: Secondary | ICD-10-CM

## 2021-12-06 DIAGNOSIS — M25511 Pain in right shoulder: Secondary | ICD-10-CM

## 2021-12-23 ENCOUNTER — Ambulatory Visit: Payer: Medicare PPO | Attending: Family Medicine | Admitting: Physical Therapy

## 2021-12-23 DIAGNOSIS — M6281 Muscle weakness (generalized): Secondary | ICD-10-CM | POA: Insufficient documentation

## 2021-12-23 DIAGNOSIS — G8929 Other chronic pain: Secondary | ICD-10-CM | POA: Insufficient documentation

## 2021-12-23 DIAGNOSIS — M542 Cervicalgia: Secondary | ICD-10-CM | POA: Insufficient documentation

## 2021-12-23 DIAGNOSIS — M25511 Pain in right shoulder: Secondary | ICD-10-CM | POA: Insufficient documentation

## 2021-12-25 ENCOUNTER — Ambulatory Visit: Payer: Medicare PPO | Admitting: Physical Therapy

## 2021-12-30 ENCOUNTER — Encounter: Payer: Medicare PPO | Admitting: Physical Therapy

## 2021-12-30 DIAGNOSIS — B0230 Zoster ocular disease, unspecified: Secondary | ICD-10-CM | POA: Diagnosis not present

## 2021-12-30 DIAGNOSIS — M792 Neuralgia and neuritis, unspecified: Secondary | ICD-10-CM | POA: Diagnosis not present

## 2021-12-31 ENCOUNTER — Ambulatory Visit: Payer: Medicare PPO | Attending: Family Medicine | Admitting: Physical Therapy

## 2021-12-31 DIAGNOSIS — G8929 Other chronic pain: Secondary | ICD-10-CM

## 2021-12-31 DIAGNOSIS — M542 Cervicalgia: Secondary | ICD-10-CM

## 2021-12-31 DIAGNOSIS — M6281 Muscle weakness (generalized): Secondary | ICD-10-CM

## 2021-12-31 DIAGNOSIS — M25511 Pain in right shoulder: Secondary | ICD-10-CM | POA: Diagnosis not present
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
## 2022-01-01 ENCOUNTER — Encounter: Payer: Medicare PPO | Admitting: Physical Therapy

## 2022-01-02 ENCOUNTER — Encounter: Payer: Self-pay | Admitting: Physical Therapy

## 2022-01-02 ENCOUNTER — Encounter: Payer: Medicare PPO | Admitting: Physical Therapy

## 2022-01-06 ENCOUNTER — Ambulatory Visit: Payer: Medicare PPO | Admitting: Physical Therapy

## 2022-01-06 ENCOUNTER — Encounter: Payer: Medicare PPO | Admitting: Physical Therapy

## 2022-01-06 DIAGNOSIS — M6281 Muscle weakness (generalized): Secondary | ICD-10-CM

## 2022-01-06 DIAGNOSIS — M25511 Pain in right shoulder: Secondary | ICD-10-CM | POA: Diagnosis not present

## 2022-01-06 DIAGNOSIS — M542 Cervicalgia: Secondary | ICD-10-CM | POA: Diagnosis not present

## 2022-01-06 DIAGNOSIS — G8929 Other chronic pain: Secondary | ICD-10-CM
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
## 2022-01-07 ENCOUNTER — Encounter: Payer: Medicare PPO | Admitting: Physical Therapy

## 2022-01-08 ENCOUNTER — Encounter: Payer: Medicare PPO | Admitting: Physical Therapy

## 2022-01-09 ENCOUNTER — Encounter: Payer: Medicare PPO | Admitting: Physical Therapy

## 2022-01-09 ENCOUNTER — Ambulatory Visit: Payer: Medicare PPO | Admitting: Physical Therapy

## 2022-01-09 ENCOUNTER — Encounter: Payer: Self-pay | Admitting: Physical Therapy

## 2022-01-09 DIAGNOSIS — G8929 Other chronic pain: Secondary | ICD-10-CM

## 2022-01-09 DIAGNOSIS — M542 Cervicalgia: Secondary | ICD-10-CM | POA: Diagnosis not present

## 2022-01-09 DIAGNOSIS — M25511 Pain in right shoulder: Secondary | ICD-10-CM | POA: Diagnosis not present

## 2022-01-09 DIAGNOSIS — M6281 Muscle weakness (generalized): Secondary | ICD-10-CM
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
## 2022-01-13 ENCOUNTER — Encounter: Payer: Medicare PPO | Admitting: Physical Therapy

## 2022-01-14 ENCOUNTER — Ambulatory Visit: Payer: Medicare PPO | Attending: Family Medicine | Admitting: Physical Therapy

## 2022-01-14 DIAGNOSIS — G8929 Other chronic pain: Secondary | ICD-10-CM | POA: Diagnosis not present

## 2022-01-14 DIAGNOSIS — M542 Cervicalgia: Secondary | ICD-10-CM | POA: Insufficient documentation

## 2022-01-14 DIAGNOSIS — M25511 Pain in right shoulder: Secondary | ICD-10-CM | POA: Insufficient documentation

## 2022-01-14 DIAGNOSIS — M6281 Muscle weakness (generalized): Secondary | ICD-10-CM | POA: Insufficient documentation
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
## 2022-01-15 ENCOUNTER — Encounter: Payer: Self-pay | Admitting: Physical Therapy

## 2022-01-15 ENCOUNTER — Encounter: Payer: Medicare PPO | Admitting: Physical Therapy

## 2022-01-16 ENCOUNTER — Ambulatory Visit: Payer: Medicare PPO | Admitting: Physical Therapy

## 2022-01-20 ENCOUNTER — Encounter: Payer: Medicare PPO | Admitting: Physical Therapy

## 2022-01-20 DIAGNOSIS — H04123 Dry eye syndrome of bilateral lacrimal glands: Secondary | ICD-10-CM | POA: Diagnosis not present

## 2022-01-21 ENCOUNTER — Ambulatory Visit: Payer: Medicare PPO | Admitting: Physical Therapy

## 2022-01-21 DIAGNOSIS — M6281 Muscle weakness (generalized): Secondary | ICD-10-CM | POA: Diagnosis not present

## 2022-01-21 DIAGNOSIS — G8929 Other chronic pain: Secondary | ICD-10-CM | POA: Diagnosis not present

## 2022-01-21 DIAGNOSIS — M25511 Pain in right shoulder: Secondary | ICD-10-CM | POA: Diagnosis not present

## 2022-01-21 DIAGNOSIS — M542 Cervicalgia: Secondary | ICD-10-CM | POA: Diagnosis not present
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
## 2022-01-22 ENCOUNTER — Encounter: Payer: Self-pay | Admitting: Physical Therapy

## 2022-01-22 ENCOUNTER — Encounter: Payer: Medicare PPO | Admitting: Physical Therapy

## 2022-01-23 ENCOUNTER — Ambulatory Visit: Payer: Medicare PPO | Admitting: Physical Therapy

## 2022-01-23 DIAGNOSIS — G8929 Other chronic pain: Secondary | ICD-10-CM | POA: Diagnosis not present

## 2022-01-23 DIAGNOSIS — M542 Cervicalgia: Secondary | ICD-10-CM

## 2022-01-23 DIAGNOSIS — M6281 Muscle weakness (generalized): Secondary | ICD-10-CM | POA: Diagnosis not present

## 2022-01-23 DIAGNOSIS — M25511 Pain in right shoulder: Secondary | ICD-10-CM | POA: Diagnosis not present
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
## 2022-01-25 ENCOUNTER — Encounter: Payer: Self-pay | Admitting: Physical Therapy

## 2022-01-27 ENCOUNTER — Encounter: Payer: Medicare PPO | Admitting: Physical Therapy

## 2022-01-28 ENCOUNTER — Ambulatory Visit: Payer: Medicare PPO | Admitting: Physical Therapy

## 2022-01-28 ENCOUNTER — Encounter: Payer: Self-pay | Admitting: Physical Therapy

## 2022-01-28 DIAGNOSIS — M542 Cervicalgia: Secondary | ICD-10-CM | POA: Diagnosis not present

## 2022-01-28 DIAGNOSIS — G8929 Other chronic pain: Secondary | ICD-10-CM | POA: Diagnosis not present

## 2022-01-28 DIAGNOSIS — M6281 Muscle weakness (generalized): Secondary | ICD-10-CM | POA: Diagnosis not present

## 2022-01-28 DIAGNOSIS — M25511 Pain in right shoulder: Secondary | ICD-10-CM | POA: Diagnosis not present
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
## 2022-01-29 ENCOUNTER — Encounter: Payer: Medicare PPO | Admitting: Physical Therapy

## 2022-01-30 ENCOUNTER — Ambulatory Visit: Payer: Medicare PPO | Admitting: Physical Therapy

## 2022-01-30 DIAGNOSIS — G8929 Other chronic pain: Secondary | ICD-10-CM

## 2022-01-30 DIAGNOSIS — M6281 Muscle weakness (generalized): Secondary | ICD-10-CM

## 2022-01-30 DIAGNOSIS — M25511 Pain in right shoulder: Secondary | ICD-10-CM | POA: Diagnosis not present

## 2022-01-30 DIAGNOSIS — M542 Cervicalgia: Secondary | ICD-10-CM
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
## 2022-02-02 ENCOUNTER — Encounter: Payer: Self-pay | Admitting: Physical Therapy

## 2022-02-04 ENCOUNTER — Ambulatory Visit: Payer: Medicare PPO | Admitting: Physical Therapy

## 2022-02-04 ENCOUNTER — Encounter: Payer: Self-pay | Admitting: Physical Therapy

## 2022-02-04 DIAGNOSIS — M6281 Muscle weakness (generalized): Secondary | ICD-10-CM | POA: Diagnosis not present

## 2022-02-04 DIAGNOSIS — G8929 Other chronic pain: Secondary | ICD-10-CM | POA: Diagnosis not present

## 2022-02-04 DIAGNOSIS — M542 Cervicalgia: Secondary | ICD-10-CM

## 2022-02-04 DIAGNOSIS — M25511 Pain in right shoulder: Secondary | ICD-10-CM | POA: Diagnosis not present
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
## 2022-02-06 ENCOUNTER — Ambulatory Visit: Payer: Medicare PPO | Admitting: Physical Therapy

## 2022-02-10 ENCOUNTER — Ambulatory Visit: Payer: Medicare PPO | Admitting: Physical Therapy

## 2022-02-26 DIAGNOSIS — H903 Sensorineural hearing loss, bilateral: Secondary | ICD-10-CM | POA: Diagnosis not present

## 2022-03-06 DIAGNOSIS — B0230 Zoster ocular disease, unspecified: Secondary | ICD-10-CM | POA: Diagnosis not present

## 2022-04-04 ENCOUNTER — Other Ambulatory Visit: Payer: Self-pay | Admitting: Family Medicine

## 2022-04-04 DIAGNOSIS — Z1231 Encounter for screening mammogram for malignant neoplasm of breast: Secondary | ICD-10-CM

## 2022-04-04 DIAGNOSIS — Z0100 Encounter for examination of eyes and vision without abnormal findings: Secondary | ICD-10-CM | POA: Diagnosis not present

## 2022-04-04 DIAGNOSIS — H04123 Dry eye syndrome of bilateral lacrimal glands: Secondary | ICD-10-CM | POA: Diagnosis not present

## 2022-04-04 LAB — HM DIABETES EYE EXAM

## 2022-04-10 ENCOUNTER — Encounter: Payer: Self-pay | Admitting: Family Medicine

## 2022-05-12 ENCOUNTER — Ambulatory Visit
Admission: RE | Admit: 2022-05-12 | Discharge: 2022-05-12 | Disposition: A | Discharge status: Home / Self Care | Payer: Medicare PPO | Source: Ambulatory Visit | Attending: Family Medicine | Admitting: Family Medicine

## 2022-05-12 DIAGNOSIS — Z1231 Encounter for screening mammogram for malignant neoplasm of breast: Secondary | ICD-10-CM | POA: Insufficient documentation

## 2022-06-06 DIAGNOSIS — H2513 Age-related nuclear cataract, bilateral: Secondary | ICD-10-CM | POA: Diagnosis not present

## 2022-06-13 ENCOUNTER — Other Ambulatory Visit: Payer: Self-pay | Admitting: Family Medicine

## 2022-06-13 NOTE — Telephone Encounter (Signed)
Medication Refill - Medication: pregabalin (LYRICA) 300 MG capsule [[id number]]  Please call pt when sent   Has the patient contacted their pharmacy? No   (Agent: If no, request that the patient contact the pharmacy for the refill. If patient does not wish to contact the pharmacy document the reason why and proceed with request.) (Agent: If yes, when and what did the pharmacy advise?)  Preferred Pharmacy (with phone number or street name):  [place], [place]     Has the patient been seen for an appointment in the last year OR does the patient have an upcoming appointment? Yes.    Agent: Please be advised that RX refills may take up to 3 business days. We ask that you follow-up with your pharmacy.
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
## 2022-06-19 MED ORDER — PREGABALIN 300 MG PO CAPS: 300.0000 mg | ORAL_CAPSULE | Freq: Two times a day (BID) | ORAL | 1 refills | Status: DC

## 2022-07-08 ENCOUNTER — Encounter: Payer: Self-pay | Admitting: Ophthalmology

## 2022-07-08 DIAGNOSIS — H2511 Age-related nuclear cataract, right eye: Secondary | ICD-10-CM | POA: Diagnosis not present

## 2022-07-08 DIAGNOSIS — H2512 Age-related nuclear cataract, left eye: Secondary | ICD-10-CM | POA: Diagnosis not present

## 2022-07-08 DIAGNOSIS — H2513 Age-related nuclear cataract, bilateral: Secondary | ICD-10-CM | POA: Diagnosis not present

## 2022-07-09 NOTE — Progress Notes (Deleted)
Complete physical exam   Patient: [name]   DOB: 08/20/1950   72 y.o. Female  MRN: [id number] Visit Date: 07/10/2022  Today's healthcare provider: Lavon Paganini, MD   No chief complaint on file.  Subjective    [name] is a 72 y.o. female who presents today for a complete physical exam.  She reports consuming a {diet types:17450} diet. {Exercise:19826} She generally feels {well/fairly well/poorly:18703}. She reports sleeping {well/fairly well/poorly:18703}. She {does/does not:200015} have additional problems to discuss today.  HPI  07/10/22 AWV  Past Medical History:  Diagnosis Date   Anxiety    Arthritis    Asthma    Bronchitis 09/04/2016   urgent care   Diabetes mellitus without complication (HCC)    Elevated C-reactive protein (CRP) 10/06/2016   GERD (gastroesophageal reflux disease)    History of shingles    Hyperlipidemia    Hypertension    Neuralgia    Right side of head and face, s/p shingles   Shingles    chronic on Rt side of head and face   Wears dentures    partial upper   Past Surgical History:  Procedure Laterality Date   ABDOMINAL HYSTERECTOMY     BREAST BIOPSY Right 2016   benign- tophat clip   BREAST BIOPSY Left 04/08/2016   -benign-fibroadenomatous changes- tophat clip   BREAST LUMPECTOMY Right 03/2016   BREAST LUMPECTOMY WITH NEEDLE LOCALIZATION Right 05/02/2016   Procedure: BREAST LUMPECTOMY WITH NEEDLE LOCALIZATION;  Surgeon: Clayburn Pert, MD;  Location: [facility];  Service: General;  Laterality: Right;   CHOLECYSTECTOMY     COLONOSCOPY WITH PROPOFOL N/A 05/02/2019   Procedure: COLONOSCOPY WITH BIOPSY;  Surgeon: Lucilla Lame, MD;  Location: [place];  Service: Endoscopy;  Laterality: N/A;  diabetic - diet controlled   KNEE ARTHROPLASTY Left 12/01/2016   Procedure: COMPUTER ASSISTED TOTAL KNEE ARTHROPLASTY;  Surgeon: Dereck Leep, MD;  Location: [facility];  Service: Orthopedics;  Laterality: Left;   POLYPECTOMY  N/A 05/02/2019   Procedure: POLYPECTOMY;  Surgeon: Lucilla Lame, MD;  Location: [place];  Service: Endoscopy;  Laterality: N/A;   TOTAL KNEE ARTHROPLASTY Right 2014   TOTAL KNEE ARTHROPLASTY Right 02/16/2013   Social History   Socioeconomic History   Marital status: Widowed    Spouse name: Not on file   Number of children: 2   Years of education: Not on file   Highest education level: High school graduate  Occupational History   Occupation: retired  Tobacco Use   Smoking status: Never   Smokeless tobacco: Never  Vaping Use   Vaping Use: Never used  Substance and Sexual Activity   Alcohol use: No    Alcohol/week: 0.0 standard drinks of alcohol   Drug use: No   Sexual activity: Not on file  Other Topics Concern   Not on file  Social History Narrative   Not on file   Social Determinants of Health   Financial Resource Strain: Low Risk  (07/09/2021)   Overall Financial Resource Strain (CARDIA)    Difficulty of Paying Living Expenses: Not hard at all  Food Insecurity: No Food Insecurity (07/09/2021)   Hunger Vital Sign    Worried About Running Out of Food in the Last Year: Never true    [place] in the Last Year: Never true  Transportation Needs: No Transportation Needs (07/09/2021)   PRAPARE - Transportation    Lack of Transportation (Medical): No    Lack of  Transportation (Non-Medical): No  Physical Activity: Insufficiently Active (07/09/2021)   Exercise Vital Sign    Days of Exercise per Week: 3 days    Minutes of Exercise per Session: 10 min  Stress: No Stress Concern Present (07/09/2021)   [place]    Feeling of Stress : Only a little  Social Connections: Moderately Isolated (07/09/2021)   Social Connection and Isolation Panel [NHANES]    Frequency of Communication with Friends and Family: More than three times a week    Frequency of Social Gatherings with Friends and Family: Twice a  week    Attends Religious Services: More than 4 times per year    Active Member of [employer] or Organizations: No    Attends Archivist Meetings: Never    Marital Status: Widowed  Intimate Partner Violence: Not At Risk (07/09/2021)   Humiliation, Afraid, Rape, and Kick questionnaire    Fear of Current or Ex-Partner: No    Emotionally Abused: No    Physically Abused: No    Sexually Abused: No   Family Status  Relation Name Status   Mother Hulda Marin Deceased at age 37   Father Milbert Coulter Deceased at age 38   Sister 73 Alive   Brother 1 Alive   MGF  Deceased   Brother 2 Alive   Brother 3 Alive       toe amputation   Neg Hx  (Not Specified)   Family History  Problem Relation Age of Onset   Anuerysm Mother        Brain   Dementia Mother    Diabetes Mother    Heart failure Father    CAD Father    Dementia Father    Diabetes Father    Lupus Sister    Throat cancer Maternal Grandfather    Diabetes Brother    Breast cancer Neg Hx    Allergies  Allergen Reactions   Lisinopril Cough   Lovastatin Other (See Comments)    Myalgia   Shellfish Allergy Rash    Patient Care Team: Bacigalupo, Dionne Bucy, MD as PCP - General (Family Medicine) Milinda Pointer, MD as Referring Physician (Pain Medicine) Marry Guan, Laurice Record, MD as Consulting Physician (Orthopedic Surgery) Eulogio Bear, MD as Consulting Physician (Ophthalmology) Vladimir Crofts, MD as Consulting Physician (Neurology) Sunday Corn, Criss Alvine Hubbard Hartshorn (Neurology)   Medications: Outpatient Medications Prior to Visit  Medication Sig   albuterol (VENTOLIN HFA) 108 (90 Base) MCG/ACT inhaler INHALE 1-2 PUFFS BY MOUTH EVERY 6 HOURS AS NEEDED FOR WHEEZE OR SHORTNESS OF BREATH (Patient not taking: Reported on 07/08/2022)   atorvastatin (LIPITOR) 80 MG tablet Take 1 tablet (80 mg total) by mouth daily.   carboxymethylcellulose (REFRESH PLUS) 0.5 % SOLN Apply 2 drops to eye 2 (two) times daily as needed. (Patient not taking: Reported on  07/08/2022)   fluticasone furoate-vilanterol (BREO ELLIPTA) 100-25 MCG/INH AEPB Inhale 1 puff into the lungs 2 (two) times daily.   hydrochlorothiazide (HYDRODIURIL) 12.5 MG tablet Take 1 tablet (12.5 mg total) by mouth daily.   hydrocortisone (ANUSOL-HC) 2.5 % rectal cream Place 1 application rectally 2 (two) times daily.   Insulin Pen Needle (B-D UF III MINI PEN NEEDLES) 31G X 5 MM MISC Use as directed   losartan (COZAAR) 100 MG tablet Take 1 tablet (100 mg total) by mouth daily.   omeprazole (PRILOSEC) 20 MG capsule Take 1 capsule (20 mg total) by mouth daily.   prednisoLONE acetate (PRED  FORTE) 1 % ophthalmic suspension 1 drop daily.   pregabalin (LYRICA) 300 MG capsule Take 1 capsule (300 mg total) by mouth 2 (two) times daily.   Semaglutide,0.25 or 0.'5MG'$ /DOS, (OZEMPIC, 0.25 OR 0.5 MG/DOSE,) 2 MG/1.5ML SOPN Inject 0.5 mg into the skin once a week. (Patient not taking: Reported on 07/08/2022)   triamcinolone ointment (KENALOG) 0.5 % Apply 1 application topically 2 (two) times daily.   VITAMIN D PO Take by mouth daily. (Patient not taking: Reported on 07/08/2022)   No facility-administered medications prior to visit.    Review of Systems  All other systems reviewed and are negative.   {Labs  Heme  Chem  Endocrine  Serology  Results Review (optional):23779}  Objective    There were no vitals taken for this visit. {Show previous vital signs (optional):23777}   Physical Exam  ***  Last depression screening scores    11/14/2021   10:45 AM 08/19/2021    9:06 AM 07/09/2021    9:20 AM  PHQ 2/9 Scores  PHQ - 2 Score 0 0 2  PHQ- 9 Score '1 3 3   '$ Last fall risk screening    11/14/2021   10:45 AM  [place] in the past year? 0  Number falls in past yr: 0  Injury with Fall? 0  Risk for fall due to : No Fall Risks  Follow up Falls evaluation completed   Last Audit-C alcohol use screening    07/09/2021    9:19 AM  Alcohol Use Disorder Test (AUDIT)  1. How often do you  have a drink containing alcohol? 0  2. How many drinks containing alcohol do you have on a typical day when you are drinking? 0  3. How often do you have six or more drinks on one occasion? 0  AUDIT-C Score 0   A score of 3 or more in women, and 4 or more in men indicates increased risk for alcohol abuse, EXCEPT if all of the points are from question 1   No results found for any visits on 07/10/22.  Assessment & Plan    Routine Health Maintenance and Physical Exam  Exercise Activities and Dietary recommendations  Goals      DIET - EAT MORE FRUITS AND VEGETABLES     DIET - INCREASE WATER INTAKE     Recommend to drink at least 6-8 8oz glasses of water per day.     Have 3 meals a day     Recommend to eat 3 small meals a day with 2 healthy snacks in between.         Immunization History  Administered Date(s) Administered   Fluad Quad(high Dose 65+) 04/05/2019, 03/06/2020, 05/16/2021   Influenza Split 04/09/2010, 04/02/2012, 05/26/2012   Influenza, High Dose Seasonal PF 04/04/2014, 02/29/2016, 02/24/2017, 04/09/2018   Moderna Sars-Covid-2 Vaccination 08/29/2019, 09/29/2019, 07/21/2020   Pneumococcal Conjugate-13 02/24/2017   Pneumococcal Polysaccharide-23 04/09/2018   Tdap 07/25/2008   Zoster Recombinat (Shingrix) 06/02/2019, 08/08/2019    Health Maintenance  Topic Date Due   DTaP/Tdap/Td (2 - Td or Tdap) 07/25/2018   INFLUENZA VACCINE  01/14/2022   COVID-19 Vaccine (4 - 2023-24 season) 02/14/2022   FOOT EXAM  05/16/2022   HEMOGLOBIN A1C  05/16/2022   Medicare Annual Wellness (AWV)  07/09/2022   Diabetic kidney evaluation - eGFR measurement  11/15/2022   Diabetic kidney evaluation - Urine ACR  11/15/2022   OPHTHALMOLOGY EXAM  04/05/2023   MAMMOGRAM  05/13/2023   COLONOSCOPY (  Pts 45-10yr Insurance coverage will need to be confirmed)  05/01/2024   Pneumonia Vaccine 72 Years old  Completed   DEXA SCAN  Completed   Hepatitis C Screening  Completed   Zoster Vaccines-  Shingrix  Completed   HPV VACCINES  Aged Out    Discussed health benefits of physical activity, and encouraged her to engage in regular exercise appropriate for her age and condition.  ***  No follow-ups on file.     {provider attestation***:1}   ALavon Paganini MD  [phone](phone) [phone](fax)  [place]

## 2022-07-10 ENCOUNTER — Encounter: Payer: Self-pay | Admitting: Family Medicine

## 2022-07-10 ENCOUNTER — Ambulatory Visit: Payer: Medicare PPO

## 2022-07-10 ENCOUNTER — Encounter: Payer: Medicare PPO | Admitting: Family Medicine

## 2022-07-10 ENCOUNTER — Ambulatory Visit (INDEPENDENT_AMBULATORY_CARE_PROVIDER_SITE_OTHER): Payer: Medicare PPO | Admitting: Family Medicine

## 2022-07-10 VITALS — BP 128/76 | Ht 63.2 in | Wt 223.9 lb

## 2022-07-10 DIAGNOSIS — J339 Nasal polyp, unspecified: Secondary | ICD-10-CM

## 2022-07-10 DIAGNOSIS — E785 Hyperlipidemia, unspecified: Secondary | ICD-10-CM | POA: Diagnosis not present

## 2022-07-10 DIAGNOSIS — Z0000 Encounter for general adult medical examination without abnormal findings: Secondary | ICD-10-CM

## 2022-07-10 DIAGNOSIS — I10 Essential (primary) hypertension: Secondary | ICD-10-CM

## 2022-07-10 DIAGNOSIS — I152 Hypertension secondary to endocrine disorders: Secondary | ICD-10-CM

## 2022-07-10 DIAGNOSIS — J4540 Moderate persistent asthma, uncomplicated: Secondary | ICD-10-CM

## 2022-07-10 DIAGNOSIS — E1169 Type 2 diabetes mellitus with other specified complication: Secondary | ICD-10-CM

## 2022-07-10 DIAGNOSIS — G518 Other disorders of facial nerve: Secondary | ICD-10-CM | POA: Diagnosis not present

## 2022-07-10 DIAGNOSIS — E1159 Type 2 diabetes mellitus with other circulatory complications: Secondary | ICD-10-CM

## 2022-07-10 DIAGNOSIS — K219 Gastro-esophageal reflux disease without esophagitis: Secondary | ICD-10-CM | POA: Diagnosis not present

## 2022-07-10 DIAGNOSIS — R413 Other amnesia: Secondary | ICD-10-CM | POA: Diagnosis not present

## 2022-07-10 DIAGNOSIS — G4719 Other hypersomnia: Secondary | ICD-10-CM | POA: Diagnosis not present

## 2022-07-10 DIAGNOSIS — G5131 Clonic hemifacial spasm, right: Secondary | ICD-10-CM | POA: Diagnosis not present

## 2022-07-10 MED ORDER — LOSARTAN POTASSIUM 100 MG PO TABS: 100.0000 mg | ORAL_TABLET | Freq: Every day | ORAL | 1 refills | Status: DC

## 2022-07-10 MED ORDER — HYDROCHLOROTHIAZIDE 12.5 MG PO TABS: 12.5000 mg | ORAL_TABLET | Freq: Every day | ORAL | 1 refills | Status: DC

## 2022-07-10 MED ORDER — ALBUTEROL SULFATE HFA 108 (90 BASE) MCG/ACT IN AERS: INHALATION_SPRAY | RESPIRATORY_TRACT | 2 refills | Status: DC

## 2022-07-10 MED ORDER — OMEPRAZOLE 20 MG PO CPDR: 20.0000 mg | DELAYED_RELEASE_CAPSULE | Freq: Every day | ORAL | 3 refills | Status: DC

## 2022-07-10 MED ORDER — FLUTICASONE FUROATE-VILANTEROL 100-25 MCG/ACT IN AEPB: 1.0000 | INHALATION_SPRAY | Freq: Every day | RESPIRATORY_TRACT | 11 refills | Status: DC

## 2022-07-10 MED ORDER — FLUTICASONE PROPIONATE 50 MCG/ACT NA SUSP: 2.0000 | Freq: Every day | NASAL | 6 refills | Status: AC

## 2022-07-10 NOTE — Assessment & Plan Note (Signed)
BP checked during AWV with [facility] just prior to office visit, in goal range  Well controlled  She will continue HCTZ 12.'5mg'$  (refills provided today) daily and losartan '100mg'$  daily  CMP collected today

## 2022-07-10 NOTE — Progress Notes (Signed)
I,[name],acting as a scribe for [employer], MD.,have documented all relevant documentation on the behalf of Sarah Foster, MD,as directed by  Sarah Foster, MD while in the presence of Sarah Foster, MD.   Complete physical exam   Patient: [name]   DOB: 14-Jun-1951   71 y.o. Female  MRN: [id number] Visit Date: 07/10/2022  Today's healthcare provider: [name], MD   Chief Complaint  Patient presents with   Annual Exam   Subjective    [name] is a 72 y.o. female who presents today for a complete physical exam.   She reports consuming a general diet. The patient does not participate in regular exercise at present. She generally feels well. She reports sleeping well. She does not have additional problems to discuss today.   HPI   Tetanus: Pharmacy Flu vaccine: Declined today Covid vaccine: Declined today  She is requesting medication refills   Past Medical History:  Diagnosis Date   Anxiety    Arthritis    Asthma    Bronchitis 09/04/2016   urgent care   Diabetes mellitus without complication (HCC)    Elevated C-reactive protein (CRP) 10/06/2016   GERD (gastroesophageal reflux disease)    History of shingles    Hyperlipidemia    Hypertension    Neuralgia    Right side of head and face, s/p shingles   Shingles    chronic on Rt side of head and face   Wears dentures    partial upper   Past Surgical History:  Procedure Laterality Date   ABDOMINAL HYSTERECTOMY     BREAST BIOPSY Right 2016   benign- tophat clip   BREAST BIOPSY Left 04/08/2016   -benign-fibroadenomatous changes- tophat clip   BREAST LUMPECTOMY Right 03/2016   BREAST LUMPECTOMY WITH NEEDLE LOCALIZATION Right 05/02/2016   Procedure: BREAST LUMPECTOMY WITH NEEDLE LOCALIZATION;  Surgeon: Sarah Pert, MD;  Location: [name];  Service: General;  Laterality: Right;   CHOLECYSTECTOMY     COLONOSCOPY WITH PROPOFOL N/A  05/02/2019   Procedure: COLONOSCOPY WITH BIOPSY;  Surgeon: Sarah Lame, MD;  Location: [name];  Service: Endoscopy;  Laterality: N/A;  diabetic - diet controlled   KNEE ARTHROPLASTY Left 12/01/2016   Procedure: COMPUTER ASSISTED TOTAL KNEE ARTHROPLASTY;  Surgeon: Sarah Leep, MD;  Location: [name];  Service: Orthopedics;  Laterality: Left;   POLYPECTOMY N/A 05/02/2019   Procedure: POLYPECTOMY;  Surgeon: Sarah Lame, MD;  Location: [name];  Service: Endoscopy;  Laterality: N/A;   TOTAL KNEE ARTHROPLASTY Right 2014   TOTAL KNEE ARTHROPLASTY Right 02/16/2013   Social History   Socioeconomic History   Marital status: Widowed    Spouse name: Not on file   Number of children: 2   Years of education: Not on file   Highest education level: High school graduate  Occupational History   Occupation: retired  Tobacco Use   Smoking status: Never   Smokeless tobacco: Never  Vaping Use   Vaping Use: Never used  Substance and Sexual Activity   Alcohol use: No    Alcohol/week: 0.0 standard drinks of alcohol   Drug use: No   Sexual activity: Not on file  Other Topics Concern   Not on file  Social History Narrative   Not on file   Social Determinants of Health   Financial Resource Strain: Low Risk  (07/10/2022)   Overall Financial Resource Strain ([name])    Difficulty of Paying Living Expenses: Not hard at  all  Food Insecurity: No Food Insecurity (07/10/2022)   Hunger Vital Sign    Worried About Running Out of Food in the Last Year: Never true    Ran Out of Food in the Last Year: Never true  Transportation Needs: No Transportation Needs (07/10/2022)   [name] - Hydrologist (Medical): No    Lack of Transportation (Non-Medical): No  Physical Activity: Insufficiently Active (07/10/2022)   Exercise Vital Sign    Days of Exercise per Week: 1 day    Minutes of Exercise per Session: 10 min  Stress: Stress Concern Present (07/10/2022)    [name]    Feeling of Stress : Rather much  Social Connections: Moderately Isolated (07/10/2022)   Social Connection and Isolation Panel [[name]]    Frequency of Communication with Friends and Family: More than three times a week    Frequency of Social Gatherings with Friends and Family: Twice a week    Attends Religious Services: More than 4 times per year    Active Member of [name] or Organizations: No    Attends Archivist Meetings: Never    Marital Status: Widowed  Intimate Partner Violence: Not At Risk (07/10/2022)   Humiliation, Afraid, Rape, and Kick questionnaire    Fear of Current or Ex-Partner: No    Emotionally Abused: No    Physically Abused: No    Sexually Abused: No   Family Status  Relation Name Status   Mother [name] Deceased at age 21   Father [name] Deceased at age 81   Sister 43 Alive   Brother 1 Alive   MGF  Deceased   Brother 2 Alive   Brother 3 Alive       [name]   Neg Hx  (Not Specified)   Family History  Problem Relation Age of Onset   Anuerysm Mother        Brain   Dementia Mother    Diabetes Mother    Heart failure Father    CAD Father    Dementia Father    Diabetes Father    Lupus Sister    Throat cancer Maternal Grandfather    Diabetes Brother    Breast cancer Neg Hx    Allergies  Allergen Reactions   Lisinopril Cough   Lovastatin Other (See Comments)    Myalgia   Shellfish Allergy Rash    Patient Care Team: [name], Sarah Bucy, MD as PCP - General (Family Medicine) Sarah Pointer, MD as Referring Physician (Pain Medicine) Sarah Guan Laurice Record, MD as Consulting Physician (Orthopedic Surgery) Sarah Bear, MD as Consulting Physician (Ophthalmology) Sarah Crofts, MD as Consulting Physician (Neurology) [name], [name] (Neurology)   Medications: Outpatient Medications Prior to Visit  Medication Sig Note   atorvastatin (LIPITOR) 80  MG tablet Take 1 tablet (80 mg total) by mouth daily.    prednisoLONE acetate (PRED FORTE) 1 % ophthalmic suspension 1 drop daily.    pregabalin (LYRICA) 300 MG capsule Take 1 capsule (300 mg total) by mouth 2 (two) times daily.    Semaglutide,0.25 or 0.'5MG'$ /DOS, (OZEMPIC, 0.25 OR 0.5 MG/DOSE,) 2 MG/1.5ML SOPN Inject 0.5 mg into the skin once a week. (Patient not taking: Reported on 07/08/2022)    [DISCONTINUED] albuterol (VENTOLIN HFA) 108 (90 Base) MCG/ACT inhaler INHALE 1-2 PUFFS BY MOUTH EVERY 6 HOURS AS NEEDED FOR WHEEZE OR SHORTNESS OF BREATH (Patient not taking: Reported on 07/08/2022)    [  DISCONTINUED] carboxymethylcellulose (REFRESH PLUS) 0.5 % SOLN Apply 2 drops to eye 2 (two) times daily as needed. (Patient not taking: Reported on 07/08/2022) 07/10/2022: patient reports using different eye drop now   [DISCONTINUED] fluticasone furoate-vilanterol (BREO ELLIPTA) 100-25 MCG/INH AEPB Inhale 1 puff into the lungs 2 (two) times daily.    [DISCONTINUED] hydrochlorothiazide (HYDRODIURIL) 12.5 MG tablet Take 1 tablet (12.5 mg total) by mouth daily.    [DISCONTINUED] hydrocortisone (ANUSOL-HC) 2.5 % rectal cream Place 1 application rectally 2 (two) times daily. (Patient not taking: Reported on 07/10/2022)    [DISCONTINUED] Insulin Pen Needle (B-D UF III MINI PEN NEEDLES) 31G X 5 MM MISC Use as directed (Patient not taking: Reported on 07/10/2022)    [DISCONTINUED] losartan (COZAAR) 100 MG tablet Take 1 tablet (100 mg total) by mouth daily.    [DISCONTINUED] omeprazole (PRILOSEC) 20 MG capsule Take 1 capsule (20 mg total) by mouth daily.    [DISCONTINUED] triamcinolone ointment (KENALOG) 0.5 % Apply 1 application topically 2 (two) times daily. (Patient not taking: Reported on 07/10/2022)    [DISCONTINUED] VITAMIN D PO Take by mouth daily. (Patient not taking: Reported on 07/08/2022)    No facility-administered medications prior to visit.    Review of Systems  All other systems reviewed and are  negative.     Objective     Vitals: BP 128/76   Ht 5' 3.2" (1.605 m)   Wt 223 lb 14.4 oz (101.6 kg)   BMI 39.41 kg/m   BSA 2.13 m      Physical Exam Vitals reviewed.  Constitutional:      General: She is not in acute distress.    Appearance: Normal appearance. She is obese. She is not ill-appearing, toxic-appearing or diaphoretic.  HENT:     Head: Normocephalic and atraumatic.     Right Ear: Tympanic membrane and external ear normal. There is no impacted cerumen.     Left Ear: Tympanic membrane and external ear normal. There is no impacted cerumen.     Nose: Nose normal. No nasal tenderness.     Comments: Nasal polyps bilaterally, non-obstructing    Mouth/Throat:     Pharynx: Oropharynx is clear.  Eyes:     General: No scleral icterus.    Extraocular Movements: Extraocular movements intact.     Conjunctiva/sclera: Conjunctivae normal.     Pupils: Pupils are equal, round, and reactive to light.  Cardiovascular:     Rate and Rhythm: Normal rate and regular rhythm.     Pulses: Normal pulses.     Heart sounds: Normal heart sounds. No murmur heard.    No friction rub. No gallop.  Pulmonary:     Effort: Pulmonary effort is normal. No respiratory distress.     Breath sounds: Normal breath sounds. No wheezing, rhonchi or rales.  Abdominal:     General: Bowel sounds are normal. There is no distension.     Palpations: Abdomen is soft. There is no mass.     Tenderness: There is no abdominal tenderness. There is no guarding.  Musculoskeletal:        General: No deformity.     Cervical back: Normal range of motion and neck supple. No rigidity.     Right lower leg: No edema.     Left lower leg: No edema.     Right foot: Bunion present.     Left foot: Bunion present.  Feet:     Right foot:     Protective Sensation: 6 sites tested.  3 sites sensed.     Skin integrity: Erythema and dry skin present. No ulcer, blister, skin breakdown or warmth.     Toenail Condition:  Right toenails are normal.     Left foot:     Protective Sensation: 6 sites tested.  3 sites sensed.     Skin integrity: Erythema and dry skin present. No ulcer, blister, skin breakdown or warmth.     Toenail Condition: Left toenails are normal.     Comments: Patient has bilateral medial deformities at base of great toes, some erythema noted on the right toes (nontender)  Unable to palpate pulses  Has 3/6 sites sensed on monofilament exam  Lymphadenopathy:     Cervical: No cervical adenopathy.  Skin:    General: Skin is warm.     Capillary Refill: Capillary refill takes less than 2 seconds.     Findings: No erythema or rash.  Neurological:     General: No focal deficit present.     Mental Status: She is alert and oriented to person, place, and time.     Motor: No weakness.     Gait: Gait normal.  Psychiatric:        Mood and Affect: Mood normal.        Behavior: Behavior normal.       Last depression screening scores    07/10/2022    9:09 AM 11/14/2021   10:45 AM 08/19/2021    9:06 AM  PHQ 2/9 Scores  PHQ - 2 Score 0 0 0  PHQ- 9 Score  1 3   Last fall risk screening    07/10/2022    9:03 AM  [place] in the past year? 0  Number falls in past yr: 0  Injury with Fall? 0  Risk for fall due to : No Fall Risks  Follow up Education provided;Falls prevention discussed   Last Audit-C alcohol use screening    07/10/2022    9:07 AM  Alcohol Use Disorder Test (AUDIT)  1. How often do you have a drink containing alcohol? 0  2. How many drinks containing alcohol do you have on a typical day when you are drinking? 0  3. How often do you have six or more drinks on one occasion? 0  AUDIT-C Score 0   A score of 3 or more in women, and 4 or more in men indicates increased risk for alcohol abuse, EXCEPT if all of the points are from question 1   No results found for any visits on 07/10/22.  Assessment & Plan    Routine Health Maintenance and Physical Exam  Exercise  Activities and Dietary recommendations  Goals      DIET - EAT MORE FRUITS AND VEGETABLES     DIET - INCREASE WATER INTAKE     Recommend to drink at least 6-8 8oz glasses of water per day.     Have 3 meals a day     Recommend to eat 3 small meals a day with 2 healthy snacks in between.        Immunization History  Administered Date(s) Administered   Fluad Quad(high Dose 65+) 04/05/2019, 03/06/2020, 05/16/2021   Influenza Split 04/09/2010, 04/02/2012, 05/26/2012   Influenza, High Dose Seasonal PF 04/04/2014, 02/29/2016, 02/24/2017, 04/09/2018   Moderna Sars-Covid-2 Vaccination 08/29/2019, 09/29/2019, 07/21/2020   Pneumococcal Conjugate-13 02/24/2017   Pneumococcal Polysaccharide-23 04/09/2018   Tdap 07/25/2008   Zoster Recombinat (Shingrix) 06/02/2019, 08/08/2019    Health Maintenance  Topic Date Due   DTaP/Tdap/Td (2 - Td or Tdap) 07/25/2018   INFLUENZA VACCINE  01/14/2022   COVID-19 Vaccine (4 - 2023-24 season) 02/14/2022   HEMOGLOBIN A1C  05/16/2022   Diabetic kidney evaluation - eGFR measurement  11/15/2022   Diabetic kidney evaluation - Urine ACR  11/15/2022   OPHTHALMOLOGY EXAM  04/05/2023   MAMMOGRAM  05/13/2023   FOOT EXAM  07/11/2023   Medicare Annual Wellness (AWV)  07/11/2023   COLONOSCOPY (Pts 45-29yr Insurance coverage will need to be confirmed)  05/01/2024   Pneumonia Vaccine 72 Years old  Completed   DEXA SCAN  Completed   Hepatitis C Screening  Completed   Zoster Vaccines- Shingrix  Completed   HPV VACCINES  Aged Out    Discussed health benefits of physical activity, and encouraged her to engage in regular exercise appropriate for her age and condition.  Problem List Items Addressed This Visit       Cardiovascular and Mediastinum   Essential hypertension    BP checked during AWV with [facility] just prior to office visit, in goal range  Well controlled  She will continue HCTZ 12.'5mg'$  (refills provided today) daily and losartan '100mg'$  daily  CMP collected  today       Relevant Medications   hydrochlorothiazide (HYDRODIURIL) 12.5 MG tablet   losartan (COZAAR) 100 MG tablet   Other Relevant Orders   Basic Metabolic Panel (BMET)     Respiratory   Asthma    Stable on current regimen  She is not having an acute exacerbation  Refills for albuterol and breo inhalers sent to pharmacy per patient request  Continue current regimen       Relevant Medications   albuterol (VENTOLIN HFA) 108 (90 Base) MCG/ACT inhaler   fluticasone furoate-vilanterol (BREO ELLIPTA) 100-25 MCG/ACT AEPB     Digestive   Gastroesophageal reflux disease    Symptoms well controlled  Refills provided for omeprazole '20mg'$  daily per patient request Continue current treatment plan   Collected B12 and CBC        Relevant Medications   omeprazole (PRILOSEC) 20 MG capsule   Other Relevant Orders   CBC   B12     Endocrine   Diabetes (HCC)      Last A1c at goal, <7.0 Repeat A1c today  Will continue current medications at current doses  Eye Exam UTD recommended  Foot exam completed today, podiatry referral submitted   ACE/ARB : losartan '100mg'$  Statin: atorvastatin '80mg'$            Relevant Medications   losartan (COZAAR) 100 MG tablet   Other Relevant Orders   Comprehensive metabolic panel   Hemoglobin A1c   Ambulatory referral to Podiatry   Hyperlipidemia associated with type 2 diabetes mellitus (HCC)    Chronic problem  Stable  Continue current medications at current doses, atorvastatin '80mg'$  daily   No medication changes today  Repeat lipid panel today          Relevant Medications   hydrochlorothiazide (HYDRODIURIL) 12.5 MG tablet   losartan (COZAAR) 100 MG tablet   Other Relevant Orders   Comprehensive metabolic panel   Lipid panel     Other   Encounter for annual physical exam - Primary   Relevant Orders   Comprehensive metabolic panel   Hemoglobin A1c   Lipid panel   Other Visit Diagnoses     Nasal polyp       Relevant  Medications   fluticasone (FLONASE) 50 MCG/ACT nasal spray  Return in about 4 months (around 11/08/2022) for HTN, asthma .    The entirety of the information documented in the History of Present Illness, Review of Systems and Physical Exam were personally obtained by me. Portions of this information were initially documented by Lyndel Pleasure, CMA and reviewed by me for thoroughness and accuracy.Sarah Foster, MD     Sarah Foster, MD  [facility] [phone] (phone) [phone] (fax)  [place]

## 2022-07-10 NOTE — Assessment & Plan Note (Signed)
Stable on current regimen  She is not having an acute exacerbation  Refills for albuterol and breo inhalers sent to pharmacy per patient request  Continue current regimen

## 2022-07-10 NOTE — Assessment & Plan Note (Addendum)
Symptoms well controlled  Refills provided for omeprazole '20mg'$  daily per patient request Continue current treatment plan   Collected B12 and CBC

## 2022-07-10 NOTE — Patient Instructions (Signed)
[name] , Thank you for taking time to come for your Medicare Wellness Visit. I appreciate your ongoing commitment to your health goals. Please review the following plan we discussed and let me know if I can assist you in the future.   These are the goals we discussed:  Goals      DIET - EAT MORE FRUITS AND VEGETABLES     DIET - INCREASE WATER INTAKE     Recommend to drink at least 6-8 8oz glasses of water per day.     Have 3 meals a day     Recommend to eat 3 small meals a day with 2 healthy snacks in between.        This is a list of the screening recommended for you and due dates:  Health Maintenance  Topic Date Due   DTaP/Tdap/Td vaccine (2 - Td or Tdap) 07/25/2018   Flu Shot  01/14/2022   COVID-19 Vaccine (4 - 2023-24 season) 02/14/2022   Complete foot exam   05/16/2022   Hemoglobin A1C  05/16/2022   Yearly kidney function blood test for diabetes  11/15/2022   Yearly kidney health urinalysis for diabetes  11/15/2022   Eye exam for diabetics  04/05/2023   Mammogram  05/13/2023   Medicare Annual Wellness Visit  07/11/2023   Colon Cancer Screening  05/01/2024   Pneumonia Vaccine  Completed   DEXA scan (bone density measurement)  Completed   Hepatitis C Screening: USPSTF Recommendation to screen - Ages 60-79 yo.  Completed   Zoster (Shingles) Vaccine  Completed   HPV Vaccine  Aged Out    Advanced directives: yes  Conditions/risks identified: none  Next appointment: Follow up in one year for your annual wellness visit 07/14/2023'@0900'$  in person   Preventive Care 65 Years and Older, Female Preventive care refers to lifestyle choices and visits with your health care provider that can promote health and wellness. What does preventive care include? A yearly physical exam. This is also called an annual well check. Dental exams once or twice a year. Routine eye exams. Ask your health care provider how often you should have your eyes checked. Personal lifestyle choices,  including: Daily care of your teeth and gums. Regular physical activity. Eating a healthy diet. Avoiding tobacco and drug use. Limiting alcohol use. Practicing safe sex. Taking low-dose aspirin every day. Taking vitamin and mineral supplements as recommended by your health care provider. What happens during an annual well check? The services and screenings done by your health care provider during your annual well check will depend on your age, overall health, lifestyle risk factors, and family history of disease. Counseling  Your health care provider may ask you questions about your: Alcohol use. Tobacco use. Drug use. Emotional well-being. Home and relationship well-being. Sexual activity. Eating habits. History of falls. Memory and ability to understand (cognition). Work and work Statistician. Reproductive health. Screening  You may have the following tests or measurements: Height, weight, and BMI. Blood pressure. Lipid and cholesterol levels. These may be checked every 5 years, or more frequently if you are over 31 years old. Skin check. Lung cancer screening. You may have this screening every year starting at age 38 if you have a 30-pack-year history of smoking and currently smoke or have quit within the past 15 years. Fecal occult blood test (FOBT) of the stool. You may have this test every year starting at age 44. Flexible sigmoidoscopy or colonoscopy. You may have a sigmoidoscopy every 5 years  or a colonoscopy every 10 years starting at age 58. Hepatitis C blood test. Hepatitis B blood test. Sexually transmitted disease (STD) testing. Diabetes screening. This is done by checking your blood sugar (glucose) after you have not eaten for a while (fasting). You may have this done every 1-3 years. Bone density scan. This is done to screen for osteoporosis. You may have this done starting at age 32. Mammogram. This may be done every 1-2 years. Talk to your health care provider  about how often you should have regular mammograms. Talk with your health care provider about your test results, treatment options, and if necessary, the need for more tests. Vaccines  Your health care provider may recommend certain vaccines, such as: Influenza vaccine. This is recommended every year. Tetanus, diphtheria, and acellular pertussis (Tdap, Td) vaccine. You may need a Td booster every 10 years. Zoster vaccine. You may need this after age 43. Pneumococcal 13-valent conjugate (PCV13) vaccine. One dose is recommended after age 53. Pneumococcal polysaccharide (PPSV23) vaccine. One dose is recommended after age 54. Talk to your health care provider about which screenings and vaccines you need and how often you need them. This information is not intended to replace advice given to you by your health care provider. Make sure you discuss any questions you have with your health care provider. Document Released: 06/29/2015 Document Revised: 02/20/2016 Document Reviewed: 04/03/2015 Elsevier Interactive Patient Education  2017 [place] Prevention in the Home Falls can cause injuries. They can happen to people of all ages. There are many things you can do to make your home safe and to help prevent falls. What can I do on the outside of my home? Regularly fix the edges of walkways and driveways and fix any cracks. Remove anything that might make you trip as you walk through a door, such as a raised step or threshold. Trim any bushes or trees on the path to your home. Use bright outdoor lighting. Clear any walking paths of anything that might make someone trip, such as rocks or tools. Regularly check to see if handrails are loose or broken. Make sure that both sides of any steps have handrails. Any raised decks and porches should have guardrails on the edges. Have any leaves, snow, or ice cleared regularly. Use sand or salt on walking paths during winter. Clean up any spills in  your garage right away. This includes oil or grease spills. What can I do in the bathroom? Use night lights. Install grab bars by the toilet and in the tub and shower. Do not use towel bars as grab bars. Use non-skid mats or decals in the tub or shower. If you need to sit down in the shower, use a plastic, non-slip stool. Keep the floor dry. Clean up any water that spills on the floor as soon as it happens. Remove soap buildup in the tub or shower regularly. Attach bath mats securely with double-sided non-slip rug tape. Do not have throw rugs and other things on the floor that can make you trip. What can I do in the bedroom? Use night lights. Make sure that you have a light by your bed that is easy to reach. Do not use any sheets or blankets that are too big for your bed. They should not hang down onto the floor. Have a firm chair that has side arms. You can use this for support while you get dressed. Do not have throw rugs and other things on the floor  that can make you trip. What can I do in the kitchen? Clean up any spills right away. Avoid walking on wet floors. Keep items that you use a lot in easy-to-reach places. If you need to reach something above you, use a strong step stool that has a grab bar. Keep electrical cords out of the way. Do not use floor polish or wax that makes floors slippery. If you must use wax, use non-skid floor wax. Do not have throw rugs and other things on the floor that can make you trip. What can I do with my stairs? Do not leave any items on the stairs. Make sure that there are handrails on both sides of the stairs and use them. Fix handrails that are broken or loose. Make sure that handrails are as long as the stairways. Check any carpeting to make sure that it is firmly attached to the stairs. Fix any carpet that is loose or worn. Avoid having throw rugs at the top or bottom of the stairs. If you do have throw rugs, attach them to the floor with carpet  tape. Make sure that you have a light switch at the top of the stairs and the bottom of the stairs. If you do not have them, ask someone to add them for you. What else can I do to help prevent falls? Wear shoes that: Do not have high heels. Have rubber bottoms. Are comfortable and fit you well. Are closed at the toe. Do not wear sandals. If you use a stepladder: Make sure that it is fully opened. Do not climb a closed stepladder. Make sure that both sides of the stepladder are locked into place. Ask someone to hold it for you, if possible. Clearly mark and make sure that you can see: Any grab bars or handrails. First and last steps. Where the edge of each step is. Use tools that help you move around (mobility aids) if they are needed. These include: Canes. Walkers. Scooters. Crutches. Turn on the lights when you go into a dark area. Replace any light bulbs as soon as they burn out. Set up your furniture so you have a clear path. Avoid moving your furniture around. If any of your floors are uneven, fix them. If there are any pets around you, be aware of where they are. Review your medicines with your doctor. Some medicines can make you feel dizzy. This can increase your chance of falling. Ask your doctor what other things that you can do to help prevent falls. This information is not intended to replace advice given to you by your health care provider. Make sure you discuss any questions you have with your health care provider. Document Released: 03/29/2009 Document Revised: 11/08/2015 Document Reviewed: 07/07/2014 Elsevier Interactive Patient Education  2017 [employer].

## 2022-07-10 NOTE — Patient Instructions (Signed)
Health Maintenance After Age 74 After age 74, you are at a higher risk for certain long-term diseases and infections as well as injuries from falls. Falls are a major cause of broken bones and head injuries in people who are older than age 74. Getting regular preventive care can help to keep you healthy and well. Preventive care includes getting regular testing and making lifestyle changes as recommended by your health care provider. Talk with your health care provider about: Which screenings and tests you should have. A screening is a test that checks for a disease when you have no symptoms. A diet and exercise plan that is right for you. What should I know about screenings and tests to prevent falls? Screening and testing are the best ways to find a health problem early. Early diagnosis and treatment give you the best chance of managing medical conditions that are common after age 74. Certain conditions and lifestyle choices may make you more likely to have a fall. Your health care provider may recommend: Regular vision checks. Poor vision and conditions such as cataracts can make you more likely to have a fall. If you wear glasses, make sure to get your prescription updated if your vision changes. Medicine review. Work with your health care provider to regularly review all of the medicines you are taking, including over-the-counter medicines. Ask your health care provider about any side effects that may make you more likely to have a fall. Tell your health care provider if any medicines that you take make you feel dizzy or sleepy. Strength and balance checks. Your health care provider may recommend certain tests to check your strength and balance while standing, walking, or changing positions. Foot health exam. Foot pain and numbness, as well as not wearing proper footwear, can make you more likely to have a fall. Screenings, including: Osteoporosis screening. Osteoporosis is a condition that causes  the bones to get weaker and break more easily. Blood pressure screening. Blood pressure changes and medicines to control blood pressure can make you feel dizzy. Depression screening. You may be more likely to have a fall if you have a fear of falling, feel depressed, or feel unable to do activities that you used to do. Alcohol use screening. Using too much alcohol can affect your balance and may make you more likely to have a fall. Follow these instructions at home: Lifestyle Do not drink alcohol if: Your health care provider tells you not to drink. If you drink alcohol: Limit how much you have to: 0-1 drink a day for women. 0-2 drinks a day for men. Know how much alcohol is in your drink. In the [place] one drink equals one 12 oz bottle of beer (355 mL), one 5 oz glass of wine (148 mL), or one 1 oz glass of hard liquor (44 mL). Do not use any products that contain nicotine or tobacco. These products include cigarettes, chewing tobacco, and vaping devices, such as e-cigarettes. If you need help quitting, ask your health care provider. Activity  Follow a regular exercise program to stay fit. This will help you maintain your balance. Ask your health care provider what types of exercise are appropriate for you. If you need a cane or walker, use it as recommended by your health care provider. Wear supportive shoes that have nonskid soles. Safety  Remove any tripping hazards, such as rugs, cords, and clutter. Install safety equipment such as grab bars in bathrooms and safety rails on stairs. Keep rooms and walkways   well-lit. General instructions Talk with your health care provider about your risks for falling. Tell your health care provider if: You fall. Be sure to tell your health care provider about all falls, even ones that seem minor. You feel dizzy, tiredness (fatigue), or off-balance. Take over-the-counter and prescription medicines only as told by your health care provider. These include  supplements. Eat a healthy diet and maintain a healthy weight. A healthy diet includes low-fat dairy products, low-fat (lean) meats, and fiber from whole grains, beans, and lots of fruits and vegetables. Stay current with your vaccines. Schedule regular health, dental, and eye exams. Summary Having a healthy lifestyle and getting preventive care can help to protect your health and wellness after age 74. Screening and testing are the best way to find a health problem early and help you avoid having a fall. Early diagnosis and treatment give you the best chance for managing medical conditions that are more common for people who are older than age 74. Falls are a major cause of broken bones and head injuries in people who are older than age 74. Take precautions to prevent a fall at home. Work with your health care provider to learn what changes you can make to improve your health and wellness and to prevent falls. This information is not intended to replace advice given to you by your health care provider. Make sure you discuss any questions you have with your health care provider. Document Revised: 10/22/2020 Document Reviewed: 10/22/2020 Elsevier Patient Education  2023 [employer]  

## 2022-07-10 NOTE — Discharge Instructions (Signed)
   Cataract Surgery, Care After ? ?This sheet gives you information about how to care for yourself after your surgery.  Your ophthalmologist may also give you more specific instructions.  If you have problems or questions, contact your doctor at [facility], [phone]. ? ?What can I expect after the surgery? ?It is common to have: ?Itching ?Foreign body sensation (feels like a grain of sand in the eye) ?Watery discharge (excess tearing) ?Sensitivity to light and touch ?Bruising in or around the eye ?Mild blurred vision ? ?Follow these instructions at home: ?Do not touch or rub your eyes. ?You may be told to wear a protective shield or sunglasses to protect your eyes. ?Do not put a contact lens in the operative eye unless your doctor approves. ?Keep the lids and face clean and dry. ?Do not allow water to hit you directly in the face while showering. ?Keep soap and shampoo out of your eyes. ?Do not use eye makeup for 1 week. ? ?Check your eye every day for signs of infection.  Watch for: ?Redness, swelling, or pain. ?Fluid, blood or pus. ?Worsening vision. ?Worsening sensitivity to light or touch. ? ?Activity: ?During the first day, avoid bending over and reading.  You may resume reading and bending the next day. ?Do not drive or use heavy machinery for at least 24 hours. ?Avoid strenuous activities for 1 week.  Activities such as walking, treadmill, exercise bike, and climbing stairs are okay. ?Do not lift heavy (>20 pound) objects for 1 week. ?Do not do yardwork, gardening, or dirty housework (mopping, cleaning bathrooms, vacuuming, etc.) for 1 week. ?Do not swim or use a hot tub for 2 weeks. ?Ask your doctor when you can return to work. ? ?General Instructions: ?Take or apply prescription and over-the-counter medicines as directed by your doctor, including eyedrops and ointments. ?Resume medications discontinued prior to surgery, unless told otherwise by your doctor. ?Keep all follow up appointments as  scheduled. ? ?Contact a health care provider if: ?You have increased bruising around your eye. ?You have pain that is not helped with medication. ?You have a fever. ?You have fluid, pus, or blood coming from your eye or incision. ?Your sensitivity to light gets worse. ?You have spots (floaters) of flashing lights in your vision. ?You have nausea or vomiting. ? ?Go to the nearest emergency room or call 911 if: ?You have sudden loss of vision. ?You have severe, worsening eye pain. ? ?

## 2022-07-10 NOTE — Assessment & Plan Note (Signed)
   Last A1c at goal, <7.0 Repeat A1c today  Will continue current medications at current doses  Eye Exam UTD recommended  Foot exam completed today, podiatry referral submitted   ACE/ARB : losartan '100mg'$  Statin: atorvastatin '80mg'$

## 2022-07-10 NOTE — Assessment & Plan Note (Signed)
Chronic problem  Stable  Continue current medications at current doses, atorvastatin '80mg'$  daily   No medication changes today  Repeat lipid panel today

## 2022-07-10 NOTE — Progress Notes (Signed)
Subjective:   [name] is a 72 y.o. female who presents for Medicare Annual (Subsequent) preventive examination.  Review of Systems         Objective:    Today's Vitals   07/10/22 0900  BP: 128/76  Weight: 223 lb 14.4 oz (101.6 kg)  Height: 5' 3.2" (1.605 m)   Body mass index is 39.41 kg/m.     07/10/2022    9:26 AM 07/10/2022    9:13 AM 12/31/2021    2:52 PM 07/09/2021    9:22 AM 07/03/2020   10:37 AM 05/02/2019    7:31 AM 04/13/2019    2:11 PM  Advanced Directives  Does Patient Have a Medical Advance Directive?  Yes Yes No Yes Yes Yes  Type of Paramedic of [place];Living will    [place];Living will [place];Living will [place];Living will  Does patient want to make changes to medical advance directive? Yes (ED - Information included in AVS)   No - Patient declined  No - Patient declined   Copy of [place] in Chart? No - copy requested    No - copy requested No - copy requested No - copy requested  Would patient like information on creating a medical advance directive?    No - Patient declined       Current Medications (verified) Outpatient Encounter Medications as of 07/10/2022  Medication Sig   atorvastatin (LIPITOR) 80 MG tablet Take 1 tablet (80 mg total) by mouth daily.   prednisoLONE acetate (PRED FORTE) 1 % ophthalmic suspension 1 drop daily.   pregabalin (LYRICA) 300 MG capsule Take 1 capsule (300 mg total) by mouth 2 (two) times daily.   [DISCONTINUED] fluticasone furoate-vilanterol (BREO ELLIPTA) 100-25 MCG/INH AEPB Inhale 1 puff into the lungs 2 (two) times daily.   [DISCONTINUED] hydrochlorothiazide (HYDRODIURIL) 12.5 MG tablet Take 1 tablet (12.5 mg total) by mouth daily.   [DISCONTINUED] losartan (COZAAR) 100 MG tablet Take 1 tablet (100 mg total) by mouth daily.   [DISCONTINUED] omeprazole (PRILOSEC) 20 MG capsule Take 1 capsule (20 mg total) by  mouth daily.   Semaglutide,0.25 or 0.'5MG'$ /DOS, (OZEMPIC, 0.25 OR 0.5 MG/DOSE,) 2 MG/1.5ML SOPN Inject 0.5 mg into the skin once a week. (Patient not taking: Reported on 07/08/2022)   [DISCONTINUED] albuterol (VENTOLIN HFA) 108 (90 Base) MCG/ACT inhaler INHALE 1-2 PUFFS BY MOUTH EVERY 6 HOURS AS NEEDED FOR WHEEZE OR SHORTNESS OF BREATH (Patient not taking: Reported on 07/08/2022)   [DISCONTINUED] carboxymethylcellulose (REFRESH PLUS) 0.5 % SOLN Apply 2 drops to eye 2 (two) times daily as needed. (Patient not taking: Reported on 07/08/2022)   [DISCONTINUED] hydrocortisone (ANUSOL-HC) 2.5 % rectal cream Place 1 application rectally 2 (two) times daily. (Patient not taking: Reported on 07/10/2022)   [DISCONTINUED] Insulin Pen Needle (B-D UF III MINI PEN NEEDLES) 31G X 5 MM MISC Use as directed (Patient not taking: Reported on 07/10/2022)   [DISCONTINUED] triamcinolone ointment (KENALOG) 0.5 % Apply 1 application topically 2 (two) times daily. (Patient not taking: Reported on 07/10/2022)   [DISCONTINUED] VITAMIN D PO Take by mouth daily. (Patient not taking: Reported on 07/08/2022)   No facility-administered encounter medications on file as of 07/10/2022.    Allergies (verified) Lisinopril, Lovastatin, and Shellfish allergy   History: Past Medical History:  Diagnosis Date   Anxiety    Arthritis    Asthma    Bronchitis 09/04/2016   urgent care   Diabetes mellitus  without complication (HCC)    Elevated C-reactive protein (CRP) 10/06/2016   GERD (gastroesophageal reflux disease)    History of shingles    Hyperlipidemia    Hypertension    Neuralgia    Right side of head and face, s/p shingles   Shingles    chronic on Rt side of head and face   Wears dentures    partial upper   Past Surgical History:  Procedure Laterality Date   ABDOMINAL HYSTERECTOMY     BREAST BIOPSY Right 2016   benign- tophat clip   BREAST BIOPSY Left 04/08/2016   -benign-fibroadenomatous changes- tophat clip   BREAST  LUMPECTOMY Right 03/2016   BREAST LUMPECTOMY WITH NEEDLE LOCALIZATION Right 05/02/2016   Procedure: BREAST LUMPECTOMY WITH NEEDLE LOCALIZATION;  Surgeon: Clayburn Pert, MD;  Location: [facility];  Service: General;  Laterality: Right;   CHOLECYSTECTOMY     COLONOSCOPY WITH PROPOFOL N/A 05/02/2019   Procedure: COLONOSCOPY WITH BIOPSY;  Surgeon: Lucilla Lame, MD;  Location: [place];  Service: Endoscopy;  Laterality: N/A;  diabetic - diet controlled   KNEE ARTHROPLASTY Left 12/01/2016   Procedure: COMPUTER ASSISTED TOTAL KNEE ARTHROPLASTY;  Surgeon: Dereck Leep, MD;  Location: [facility];  Service: Orthopedics;  Laterality: Left;   POLYPECTOMY N/A 05/02/2019   Procedure: POLYPECTOMY;  Surgeon: Lucilla Lame, MD;  Location: [facility];  Service: Endoscopy;  Laterality: N/A;   TOTAL KNEE ARTHROPLASTY Right 2014   TOTAL KNEE ARTHROPLASTY Right 02/16/2013   Family History  Problem Relation Age of Onset   Anuerysm Mother        Brain   Dementia Mother    Diabetes Mother    Heart failure Father    CAD Father    Dementia Father    Diabetes Father    Lupus Sister    Throat cancer Maternal Grandfather    Diabetes Brother    Breast cancer Neg Hx    Social History   Socioeconomic History   Marital status: Widowed    Spouse name: Not on file   Number of children: 2   Years of education: Not on file   Highest education level: High school graduate  Occupational History   Occupation: retired  Tobacco Use   Smoking status: Never   Smokeless tobacco: Never  Vaping Use   Vaping Use: Never used  Substance and Sexual Activity   Alcohol use: No    Alcohol/week: 0.0 standard drinks of alcohol   Drug use: No   Sexual activity: Not on file  Other Topics Concern   Not on file  Social History Narrative   Not on file   Social Determinants of Health   Financial Resource Strain: Low Risk  (07/10/2022)   Overall Financial Resource Strain (CARDIA)    Difficulty of Paying  Living Expenses: Not hard at all  Food Insecurity: No Food Insecurity (07/10/2022)   Hunger Vital Sign    Worried About Running Out of Food in the Last Year: Never true    [place] in the Last Year: Never true  Transportation Needs: No Transportation Needs (07/10/2022)   PRAPARE - Hydrologist (Medical): No    Lack of Transportation (Non-Medical): No  Physical Activity: Insufficiently Active (07/10/2022)   Exercise Vital Sign    Days of Exercise per Week: 1 day    Minutes of Exercise per Session: 10 min  Stress: Stress Concern Present (07/10/2022)   [place] -  Occupational Stress Questionnaire    Feeling of Stress : Rather much  Social Connections: Moderately Isolated (07/10/2022)   Social Connection and Isolation Panel [NHANES]    Frequency of Communication with Friends and Family: More than three times a week    Frequency of Social Gatherings with Friends and Family: Twice a week    Attends Religious Services: More than 4 times per year    Active Member of [employer] or Organizations: No    Attends Archivist Meetings: Never    Marital Status: Widowed    Tobacco Counseling Counseling given: Not Answered   Clinical Intake:  Pre-visit preparation completed: Yes  Pain : No/denies pain     BMI - recorded: 39.41 Nutritional Risks: None Diabetes: Yes CBG done?: No Did pt. bring in CBG monitor from home?: No  How often do you need to have someone help you when you read instructions, pamphlets, or other written materials from your doctor or pharmacy?: 1 - Never  Diabetic?no  Interpreter Needed?: No  Information entered by :: B.Docie Abramovich,LPN   Activities of Daily Living    07/10/2022    9:13 AM 11/14/2021   10:45 AM  In your present state of health, do you have any difficulty performing the following activities:  Hearing? 0 1  Vision? 0 0  Difficulty concentrating or making decisions? 0 0  Walking or  climbing stairs? 0 0  Dressing or bathing? 0 0  Doing errands, shopping? 0 0  Preparing Food and eating ? N   Using the Toilet? N   In the past six months, have you accidently leaked urine? N   Do you have problems with loss of bowel control? N   Managing your Medications? N   Managing your Finances? N   Housekeeping or managing your Housekeeping? N     Patient Care Team: Virginia Crews, MD as PCP - General (Family Medicine) Milinda Pointer, MD as Referring Physician (Pain Medicine) Marry Guan Laurice Record, MD as Consulting Physician (Orthopedic Surgery) Eulogio Bear, MD as Consulting Physician (Ophthalmology) Vladimir Crofts, MD as Consulting Physician (Neurology) Sunday Corn Criss Alvine Hubbard Hartshorn (Neurology)  Indicate any recent Medical Services you may have received from other than [facility] providers in the past year (date may be approximate).     Assessment:   This is a routine wellness examination for Tracy.  Hearing/Vision screen Hearing Screening - Comments:: Adequate hearing Vision Screening - Comments:: Cataract surgery  on rt eye Monday:Dr King/[facility]  Dietary issues and exercise activities discussed: Current Exercise Habits: The patient does not participate in regular exercise at present, Exercise limited by: None identified   Goals Addressed             This Visit's Progress    DIET - EAT MORE FRUITS AND VEGETABLES   Not on track    DIET - [place]   Not on track    Recommend to drink at least 6-8 8oz glasses of water per day.     Have 3 meals a day   On track    Recommend to eat 3 small meals a day with 2 healthy snacks in between.       Depression Screen    07/10/2022    9:09 AM 11/14/2021   10:45 AM 08/19/2021    9:06 AM 07/09/2021    9:20 AM 05/16/2021    9:25 AM 09/06/2020   11:18 AM 08/06/2020    9:29 AM  PHQ 2/9 Scores  PHQ -  2 Score 0 0 0 '2 2 6 '$ 0  PHQ- 9 Score  '1 3 3 3 10 5    '$ Fall Risk    07/10/2022    9:03 AM 11/14/2021   10:45  AM 08/19/2021    9:07 AM 07/09/2021    9:24 AM 05/16/2021    9:25 AM  Fall Risk   Falls in the past year? 0 0 0 0 0  Number falls in past yr: 0 0 0 0 0  Injury with Fall? 0 0 0 0 0  Risk for fall due to : No Fall Risks No Fall Risks  No Fall Risks   Follow up Education provided;Falls prevention discussed Falls evaluation completed  Falls evaluation completed     FALL RISK PREVENTION PERTAINING TO THE HOME:  Any stairs in or around the home? Yes  If so, are there any without handrails? Yes  Home free of loose throw rugs in walkways, pet beds, electrical cords, etc? Yes  Adequate lighting in your home to reduce risk of falls? Yes   ASSISTIVE DEVICES UTILIZED TO PREVENT FALLS:  Life alert? No  Use of a cane, walker or w/c? No  Grab bars in the bathroom? No  Shower chair or bench in shower? Yes  Elevated toilet seat or a handicapped toilet? Yes    Immunizations Immunization History  Administered Date(s) Administered   Fluad Quad(high Dose 65+) 04/05/2019, 03/06/2020, 05/16/2021   Influenza Split 04/09/2010, 04/02/2012, 05/26/2012   Influenza, High Dose Seasonal PF 04/04/2014, 02/29/2016, 02/24/2017, 04/09/2018   Moderna Sars-Covid-2 Vaccination 08/29/2019, 09/29/2019, 07/21/2020   Pneumococcal Conjugate-13 02/24/2017   Pneumococcal Polysaccharide-23 04/09/2018   Tdap 07/25/2008   Zoster Recombinat (Shingrix) 06/02/2019, 08/08/2019    TDAP status: Due, Education has been provided regarding the importance of this vaccine. Advised may receive this vaccine at local pharmacy or Health Dept. Aware to provide a copy of the vaccination record if obtained from local pharmacy or Health Dept. Verbalized acceptance and understanding.  Flu Vaccine status: Due, Education has been provided regarding the importance of this vaccine. Advised may receive this vaccine at local pharmacy or Health Dept. Aware to provide a copy of the vaccination record if obtained from local pharmacy or Health Dept.  Verbalized acceptance and understanding.  Pneumococcal vaccine status: Up to date  Covid-19 vaccine status: Completed vaccines  Qualifies for Shingles Vaccine? Yes   Zostavax completed Yes   Shingrix Completed?: Yes  Screening Tests Health Maintenance  Topic Date Due   DTaP/Tdap/Td (2 - Td or Tdap) 07/25/2018   INFLUENZA VACCINE  01/14/2022   COVID-19 Vaccine (4 - 2023-24 season) 02/14/2022   HEMOGLOBIN A1C  05/16/2022   Diabetic kidney evaluation - eGFR measurement  11/15/2022   Diabetic kidney evaluation - Urine ACR  11/15/2022   OPHTHALMOLOGY EXAM  04/05/2023   MAMMOGRAM  05/13/2023   FOOT EXAM  07/11/2023   Medicare Annual Wellness (AWV)  07/11/2023   COLONOSCOPY (Pts 45-42yr Insurance coverage will need to be confirmed)  05/01/2024   Pneumonia Vaccine 72 Years old  Completed   DEXA SCAN  Completed   Hepatitis C Screening  Completed   Zoster Vaccines- Shingrix  Completed   HPV VACCINES  Aged Out    Health Maintenance  Health Maintenance Due  Topic Date Due   DTaP/Tdap/Td (2 - Td or Tdap) 07/25/2018   INFLUENZA VACCINE  01/14/2022   COVID-19 Vaccine (4 - 2023-24 season) 02/14/2022   HEMOGLOBIN A1C  05/16/2022    Colorectal cancer screening:  Type of screening: Colonoscopy. Completed yes. Repeat every 5 years  Mammogram status: Completed yes. Repeat every year  Bone Density status: Completed yes. Results reflect: Bone density results: NORMAL. Repeat every 5 years.  Lung Cancer Screening: (Low Dose CT Chest recommended if Age 58-80 years, 30 pack-year currently smoking OR have quit w/in 15years.) does not qualify.   Lung Cancer Screening Referral: no  Additional Screening:  Hepatitis C Screening: does not qualify; Completed yes  Vision Screening: Recommended annual ophthalmology exams for early detection of glaucoma and other disorders of the eye. Is the patient up to date with their annual eye exam?  Yes  Who is the provider or what is the name of the  office in which the patient attends annual eye exams? Dr. [facility] will have cataract surgery on Monday 07/14/22 If pt is not established with a provider, would they like to be referred to a provider to establish care? No .   Dental Screening: Recommended annual dental exams for proper oral hygiene done  Community Resource Referral / Chronic Care Management: CRR required this visit?  No   CCM required this visit?   No     Plan:     I have personally reviewed and noted the following in the patient's chart:   Medical and social history Use of alcohol, tobacco or illicit drugs  Current medications and supplements including opioid prescriptions. Patient is not currently taking opioid prescriptions. Functional ability and status Nutritional status Physical activity Advanced directives List of other physicians Hospitalizations, surgeries, and ER visits in previous 12 months Vitals Screenings to include cognitive, depression, and falls Referrals and appointments  In addition, I have reviewed and discussed with patient certain preventive protocols, quality metrics, and best practice recommendations. A written personalized care plan for preventive services as well as general preventive health recommendations were provided to patient.     Roger Shelter, LPN   6/44/0347   Nurse Notes: none

## 2022-07-11 ENCOUNTER — Ambulatory Visit: Payer: Medicare PPO | Admitting: Physician Assistant

## 2022-07-11 LAB — COMPREHENSIVE METABOLIC PANEL
ALT: 8 IU/L (ref 0–32)
AST: 13 IU/L (ref 0–40)
Albumin/Globulin Ratio: 1.8 (ref 1.2–2.2)
Albumin: 4.1 g/dL (ref 3.8–4.8)
Alkaline Phosphatase: 80 IU/L (ref 44–121)
BUN/Creatinine Ratio: 12 (ref 12–28)
BUN: 12 mg/dL (ref 8–27)
Bilirubin Total: 0.6 mg/dL (ref 0.0–1.2)
CO2: 20 mmol/L (ref 20–29)
Calcium: 9.2 mg/dL (ref 8.7–10.3)
Chloride: 104 mmol/L (ref 96–106)
Creatinine, Ser: 0.97 mg/dL (ref 0.57–1.00)
Globulin, Total: 2.3 g/dL (ref 1.5–4.5)
Glucose: 88 mg/dL (ref 70–99)
Potassium: 4 mmol/L (ref 3.5–5.2)
Sodium: 141 mmol/L (ref 134–144)
Total Protein: 6.4 g/dL (ref 6.0–8.5)
eGFR: 62 mL/min/{1.73_m2} (ref >59)

## 2022-07-11 LAB — LIPID PANEL
Chol/HDL Ratio: 4.1 ratio (ref 0.0–4.4)
Cholesterol, Total: 178 mg/dL (ref 100–199)
HDL: 43 mg/dL (ref >39)
LDL Chol Calc (NIH): 116 mg/dL — ABNORMAL HIGH (ref 0–99)
Triglycerides: 105 mg/dL (ref 0–149)
VLDL Cholesterol Cal: 19 mg/dL (ref 5–40)

## 2022-07-11 LAB — CBC
Hematocrit: 42.6 % (ref 34.0–46.6)
Hemoglobin: 13.7 g/dL (ref 11.1–15.9)
MCH: 26.3 pg — ABNORMAL LOW (ref 26.6–33.0)
MCHC: 32.2 g/dL (ref 31.5–35.7)
MCV: 82 fL (ref 79–97)
Platelets: 234 10*3/uL (ref 150–450)
RBC: 5.21 x10E6/uL (ref 3.77–5.28)
RDW: 12.7 % (ref 11.7–15.4)
WBC: 8.3 10*3/uL (ref 3.4–10.8)

## 2022-07-11 LAB — HEMOGLOBIN A1C
Est. average glucose Bld gHb Est-mCnc: 128 mg/dL
Hgb A1c MFr Bld: 6.1 % — ABNORMAL HIGH (ref 4.8–5.6)

## 2022-07-11 LAB — VITAMIN B12: Vitamin B-12: 401 pg/mL (ref 232–1245)

## 2022-07-14 ENCOUNTER — Ambulatory Visit: Payer: Medicare PPO | Admitting: Anesthesiology

## 2022-07-14 ENCOUNTER — Other Ambulatory Visit: Payer: Self-pay

## 2022-07-14 ENCOUNTER — Ambulatory Visit
Admission: RE | Admit: 2022-07-14 | Discharge: 2022-07-14 | Disposition: A | Discharge status: Home / Self Care | Payer: Medicare PPO | Attending: Ophthalmology | Admitting: Ophthalmology

## 2022-07-14 ENCOUNTER — Encounter
Admission: RE | Disposition: A | Discharge status: Home / Self Care | Payer: Self-pay | Source: Home / Self Care | Attending: Ophthalmology

## 2022-07-14 ENCOUNTER — Encounter: Payer: Self-pay | Admitting: Ophthalmology

## 2022-07-14 DIAGNOSIS — K219 Gastro-esophageal reflux disease without esophagitis: Secondary | ICD-10-CM | POA: Insufficient documentation

## 2022-07-14 DIAGNOSIS — J45909 Unspecified asthma, uncomplicated: Secondary | ICD-10-CM | POA: Insufficient documentation

## 2022-07-14 DIAGNOSIS — H2511 Age-related nuclear cataract, right eye: Secondary | ICD-10-CM | POA: Insufficient documentation

## 2022-07-14 DIAGNOSIS — M1990 Unspecified osteoarthritis, unspecified site: Secondary | ICD-10-CM | POA: Insufficient documentation

## 2022-07-14 DIAGNOSIS — I10 Essential (primary) hypertension: Secondary | ICD-10-CM | POA: Insufficient documentation

## 2022-07-14 DIAGNOSIS — T7840XA Allergy, unspecified, initial encounter: Secondary | ICD-10-CM | POA: Diagnosis not present

## 2022-07-14 DIAGNOSIS — E1136 Type 2 diabetes mellitus with diabetic cataract: Secondary | ICD-10-CM | POA: Insufficient documentation

## 2022-07-14 DIAGNOSIS — Z6839 Body mass index (BMI) 39.0-39.9, adult: Secondary | ICD-10-CM | POA: Diagnosis not present

## 2022-07-14 HISTORY — PX: CATARACT EXTRACTION W/PHACO: SHX586

## 2022-07-14 SURGERY — PHACOEMULSIFICATION, CATARACT, WITH IOL INSERTION
Anesthesia: Monitor Anesthesia Care | Site: Eye | Laterality: Right

## 2022-07-14 MED ORDER — MOXIFLOXACIN HCL 0.5 % OP SOLN
OPHTHALMIC | Status: DC | PRN
  Administered 2022-07-14: .2 mL via OPHTHALMIC

## 2022-07-14 MED ORDER — ARMC OPHTHALMIC DILATING DROPS
1.0000 | OPHTHALMIC | Status: DC | PRN
  Administered 2022-07-14 (×3): 1 via OPHTHALMIC

## 2022-07-14 MED ORDER — SIGHTPATH DOSE#1 BSS IO SOLN
INTRAOCULAR | Status: DC | PRN
  Administered 2022-07-14: 15 mL

## 2022-07-14 MED ORDER — LIDOCAINE HCL (PF) 2 % IJ SOLN
INTRAOCULAR | Status: DC | PRN
  Administered 2022-07-14: 1 mL via INTRAOCULAR

## 2022-07-14 MED ORDER — TETRACAINE HCL 0.5 % OP SOLN
1.0000 [drp] | OPHTHALMIC | Status: DC | PRN
  Administered 2022-07-14 (×3): 1 [drp] via OPHTHALMIC

## 2022-07-14 MED ORDER — LACTATED RINGERS IV SOLN: INTRAVENOUS | Status: DC

## 2022-07-14 MED ORDER — SIGHTPATH DOSE#1 NA HYALUR & NA CHOND-NA HYALUR IO KIT
PACK | INTRAOCULAR | Status: DC | PRN
  Administered 2022-07-14: 1 via OPHTHALMIC

## 2022-07-14 MED ORDER — ACETAMINOPHEN 500 MG PO TABS
1000.0000 mg | ORAL_TABLET | Freq: Once | ORAL | Status: AC
  Administered 2022-07-14: 1000 mg via ORAL

## 2022-07-14 MED ORDER — SIGHTPATH DOSE#1 BSS IO SOLN
INTRAOCULAR | Status: DC | PRN
  Administered 2022-07-14: 83 mL via OPHTHALMIC

## 2022-07-14 MED ORDER — FENTANYL CITRATE (PF) 100 MCG/2ML IJ SOLN
INTRAMUSCULAR | Status: DC | PRN
  Administered 2022-07-14: 25 ug via INTRAVENOUS

## 2022-07-14 MED ORDER — MIDAZOLAM HCL 2 MG/2ML IJ SOLN
INTRAMUSCULAR | Status: DC | PRN
  Administered 2022-07-14: 2 mg via INTRAVENOUS

## 2022-07-14 SURGICAL SUPPLY — 13 items
CATARACT SUITE SIGHTPATH (MISCELLANEOUS) ×1 IMPLANT
DISSECTOR HYDRO NUCLEUS 50X22 (MISCELLANEOUS) ×2 IMPLANT
FEE CATARACT SUITE SIGHTPATH (MISCELLANEOUS) ×2 IMPLANT
GLOVE SURG GAMMEX PI TX LF 7.5 (GLOVE) ×2 IMPLANT
GLOVE SURG SYN 8.5  E (GLOVE) ×1
GLOVE SURG SYN 8.5 E (GLOVE) ×1 IMPLANT
GLOVE SURG SYN 8.5 PF PI (GLOVE) ×2 IMPLANT
LENS IOL TECNIS EYHANCE 22.0 (Intraocular Lens) IMPLANT
NDL FILTER BLUNT 18X1 1/2 (NEEDLE) ×2 IMPLANT
NEEDLE FILTER BLUNT 18X1 1/2 (NEEDLE) ×1 IMPLANT
SYR 3ML LL SCALE MARK (SYRINGE) ×2 IMPLANT
SYR 5ML LL (SYRINGE) ×2 IMPLANT
WATER STERILE IRR 250ML POUR (IV SOLUTION) ×2 IMPLANT

## 2022-07-14 NOTE — Transfer of Care (Signed)
Immediate Anesthesia Transfer of Care Note  Patient: [name]  Procedure(s) Performed: CATARACT EXTRACTION PHACO AND INTRAOCULAR LENS PLACEMENT (IOC) RIGHT DIABETIC  6.26  00:40.3 (Right: Eye)  Patient Location: PACU  Anesthesia Type:MAC  Level of Consciousness: awake, alert , and oriented  Airway & Oxygen Therapy: Patient Spontanous Breathing  Post-op Assessment: Report given to RN and Post -op Vital signs reviewed and stable  Post vital signs: Reviewed and stable  Last Vitals:  Vitals Value Taken Time  BP    Temp    Pulse 76 07/14/22 0751  Resp 15 07/14/22 0751  SpO2 98 % 07/14/22 0751  Vitals shown include unvalidated device data.  Last Pain:  Vitals:   07/14/22 0643  TempSrc: Temporal  PainSc: 0-No pain         Complications: No notable events documented.

## 2022-07-14 NOTE — Anesthesia Postprocedure Evaluation (Signed)
Anesthesia Post Note  Patient: VINCENT EHRLER  Procedure(s) Performed: CATARACT EXTRACTION PHACO AND INTRAOCULAR LENS PLACEMENT (IOC) RIGHT DIABETIC  6.26  00:40.3 (Right: Eye)  Patient location during evaluation: PACU Anesthesia Type: MAC Level of consciousness: awake and alert Pain management: pain level controlled Vital Signs Assessment: post-procedure vital signs reviewed and stable Respiratory status: spontaneous breathing, nonlabored ventilation, respiratory function stable and patient connected to nasal cannula oxygen Cardiovascular status: stable and blood pressure returned to baseline Postop Assessment: no apparent nausea or vomiting Anesthetic complications: no   No notable events documented.   Last Vitals:  Vitals:   07/14/22 0751 07/14/22 0759  BP: 130/77 132/80  Pulse: 77 80  Resp: 15 15  Temp: (!) 36.1 C (!) 36.1 C  SpO2: 98% 97%    Last Pain:  Vitals:   07/14/22 0759  TempSrc:   PainSc: 0-No pain                 Martha Clan

## 2022-07-14 NOTE — Op Note (Signed)
OPERATIVE NOTE  [place] [id number] 07/14/2022   PREOPERATIVE DIAGNOSIS:  Nuclear sclerotic cataract right eye.  H25.11   POSTOPERATIVE DIAGNOSIS:    Nuclear sclerotic cataract right eye.     PROCEDURE:  Phacoemusification with posterior chamber intraocular lens placement of the right eye   LENS:   Implant Name Type Inv. Item Serial No. Manufacturer Lot No. LRB No. Used Action  LENS IOL TECNIS EYHANCE 22.0 - [id number] Intraocular Lens LENS IOL TECNIS EYHANCE 22.0 [id number] [employer]  Right 1 Implanted       Procedure(s): CATARACT EXTRACTION PHACO AND INTRAOCULAR LENS PLACEMENT (IOC) RIGHT DIABETIC  6.26  00:40.3 (Right)  DIB00 +22.0   ULTRASOUND TIME: 0 minutes 40 seconds.  CDE 6.26   SURGEON:  Benay Pillow, MD, MPH  ANESTHESIOLOGIST: Anesthesiologist: Martha Clan, MD CRNA: Aline Brochure, CRNA   ANESTHESIA:  Topical with tetracaine drops augmented with 1% preservative-free intracameral lidocaine.  ESTIMATED BLOOD LOSS: less than 1 mL.   COMPLICATIONS:  None.   DESCRIPTION OF PROCEDURE:  The patient was identified in the holding room and transported to the operating room and placed in the supine position under the operating microscope.  The right eye was identified as the operative eye and it was prepped and draped in the usual sterile ophthalmic fashion.   A 1.0 millimeter clear-corneal paracentesis was made at the 10:30 position. 0.5 ml of preservative-free 1% lidocaine with epinephrine was injected into the anterior chamber.  The anterior chamber was filled with viscoelastic.  A 2.4 millimeter keratome was used to make a near-clear corneal incision at the 8:00 position.  A curvilinear capsulorrhexis was made with a cystotome and capsulorrhexis forceps.  Balanced salt solution was used to hydrodissect and hydrodelineate the nucleus.   Phacoemulsification was then used in stop and chop fashion to remove the lens nucleus and epinucleus.  The remaining cortex was  then removed using the irrigation and aspiration handpiece. Viscoelastic was then placed into the capsular bag to distend it for lens placement.  A lens was then injected into the capsular bag.  The remaining viscoelastic was aspirated.   Wounds were hydrated with balanced salt solution.  The anterior chamber was inflated to a physiologic pressure with balanced salt solution.   Intracameral vigamox 0.1 mL undiluted was injected into the eye and a drop placed onto the ocular surface.  No wound leaks were noted.  The patient was taken to the recovery room in stable condition without complications of anesthesia or surgery  Benay Pillow 07/14/2022, 7:48 AM

## 2022-07-14 NOTE — Anesthesia Preprocedure Evaluation (Signed)
Anesthesia Evaluation  Patient identified by MRN, date of birth, ID band Patient awake    Reviewed: Allergy & Precautions, NPO status , Patient's Chart, lab work & pertinent test results  History of Anesthesia Complications Negative for: history of anesthetic complications  Airway Mallampati: III   Neck ROM: Full    Dental  (+)    Pulmonary neg shortness of breath, asthma , neg recent URI   Pulmonary exam normal breath sounds clear to auscultation       Cardiovascular hypertension, (-) angina (-) Past MI and (-) Cardiac Stents Normal cardiovascular exam(-) dysrhythmias (-) Valvular Problems/Murmurs Rhythm:Regular Rate:Normal     Neuro/Psych  PSYCHIATRIC DISORDERS Anxiety Depression    negative neurological ROS     GI/Hepatic ,GERD  ,,  Endo/Other  diabetes, Type 2  Morbid obesity  Renal/GU negative Renal ROS     Musculoskeletal  (+) Arthritis ,    Abdominal   Peds  Hematology negative hematology ROS (+)   Anesthesia Other Findings Past Medical History: No date: Anxiety No date: Arthritis No date: Asthma 09/04/2016: Bronchitis     Comment:  urgent care No date: Diabetes mellitus without complication ([place]) 1/69/6789: Elevated C-reactive protein (CRP) No date: GERD (gastroesophageal reflux disease) No date: History of shingles No date: Hyperlipidemia No date: Hypertension No date: Neuralgia     Comment:  Right side of head and face, s/p shingles No date: Shingles     Comment:  chronic on Rt side of head and face No date: Wears dentures     Comment:  partial upper   Reproductive/Obstetrics                             Anesthesia Physical Anesthesia Plan  ASA: 3  Anesthesia Plan: MAC   Post-op Pain Management:    Induction: Intravenous  PONV Risk Score and Plan: 3 and Treatment may vary due to age or medical condition and Midazolam  Airway Management Planned: Natural  Airway and Nasal Cannula  Additional Equipment:   Intra-op Plan:   Post-operative Plan:   Informed Consent: I have reviewed the patients History and Physical, chart, labs and discussed the procedure including the risks, benefits and alternatives for the proposed anesthesia with the patient or authorized representative who has indicated his/her understanding and acceptance.       Plan Discussed with: CRNA  Anesthesia Plan Comments:         Anesthesia Quick Evaluation

## 2022-07-14 NOTE — H&P (Signed)
[facility]   Primary Care Physician:  Virginia Crews, MD Ophthalmologist: Dr. Benay Pillow  Pre-Procedure History & Physical: HPI:  [name] is a 72 y.o. female here for cataract surgery.   Past Medical History:  Diagnosis Date   Anxiety    Arthritis    Asthma    Bronchitis 09/04/2016   urgent care   Diabetes mellitus without complication (HCC)    Elevated C-reactive protein (CRP) 10/06/2016   GERD (gastroesophageal reflux disease)    History of shingles    Hyperlipidemia    Hypertension    Neuralgia    Right side of head and face, s/p shingles   Shingles    chronic on Rt side of head and face   Wears dentures    partial upper    Past Surgical History:  Procedure Laterality Date   ABDOMINAL HYSTERECTOMY     BREAST BIOPSY Right 2016   benign- tophat clip   BREAST BIOPSY Left 04/08/2016   -benign-fibroadenomatous changes- tophat clip   BREAST LUMPECTOMY Right 03/2016   BREAST LUMPECTOMY WITH NEEDLE LOCALIZATION Right 05/02/2016   Procedure: BREAST LUMPECTOMY WITH NEEDLE LOCALIZATION;  Surgeon: Clayburn Pert, MD;  Location: [facility];  Service: General;  Laterality: Right;   CHOLECYSTECTOMY     COLONOSCOPY WITH PROPOFOL N/A 05/02/2019   Procedure: COLONOSCOPY WITH BIOPSY;  Surgeon: Lucilla Lame, MD;  Location: [place];  Service: Endoscopy;  Laterality: N/A;  diabetic - diet controlled   KNEE ARTHROPLASTY Left 12/01/2016   Procedure: COMPUTER ASSISTED TOTAL KNEE ARTHROPLASTY;  Surgeon: Dereck Leep, MD;  Location: [facility];  Service: Orthopedics;  Laterality: Left;   POLYPECTOMY N/A 05/02/2019   Procedure: POLYPECTOMY;  Surgeon: Lucilla Lame, MD;  Location: [place];  Service: Endoscopy;  Laterality: N/A;   TOTAL KNEE ARTHROPLASTY Right 2014   TOTAL KNEE ARTHROPLASTY Right 02/16/2013    Prior to Admission medications   Medication Sig Start Date End Date Taking? Authorizing Provider  atorvastatin (LIPITOR) 80 MG tablet Take 1  tablet (80 mg total) by mouth daily. 11/14/21  Yes Bacigalupo, Dionne Bucy, MD  hydrochlorothiazide (HYDRODIURIL) 12.5 MG tablet Take 1 tablet (12.5 mg total) by mouth daily. 07/10/22  Yes Simmons-Robinson, Makiera, MD  losartan (COZAAR) 100 MG tablet Take 1 tablet (100 mg total) by mouth daily. 07/10/22  Yes Simmons-Robinson, Makiera, MD  omeprazole (PRILOSEC) 20 MG capsule Take 1 capsule (20 mg total) by mouth daily. 07/10/22  Yes Simmons-Robinson, Makiera, MD  prednisoLONE acetate (PRED FORTE) 1 % ophthalmic suspension 1 drop daily.   Yes [provider]  pregabalin (LYRICA) 300 MG capsule Take 1 capsule (300 mg total) by mouth 2 (two) times daily. 06/19/22 06/19/23 Yes Bacigalupo, Dionne Bucy, MD  albuterol (VENTOLIN HFA) 108 (90 Base) MCG/ACT inhaler INHALE 1-2 PUFFS BY MOUTH EVERY 6 HOURS AS NEEDED FOR WHEEZE OR SHORTNESS OF BREATH Patient not taking: Reported on 07/14/2022 07/10/22   Simmons-Robinson, Riki Sheer, MD  fluticasone (FLONASE) 50 MCG/ACT nasal spray Place 2 sprays into both nostrils daily. Patient not taking: Reported on 07/14/2022 07/10/22   Simmons-Robinson, Riki Sheer, MD  fluticasone furoate-vilanterol (BREO ELLIPTA) 100-25 MCG/ACT AEPB Inhale 1 puff into the lungs daily. Patient not taking: Reported on 07/14/2022 07/10/22   Simmons-Robinson, Riki Sheer, MD  Semaglutide,0.25 or 0.'5MG'$ /DOS, (OZEMPIC, 0.25 OR 0.5 MG/DOSE,) 2 MG/1.5ML SOPN Inject 0.5 mg into the skin once a week. Patient not taking: Reported on 07/08/2022 11/14/21   Virginia Crews, MD    Allergies as of 06/13/2022 -  Review Complete 02/04/2022  Allergen Reaction Noted   Lisinopril Cough 10/31/2014   Lovastatin Other (See Comments) 10/31/2014   Shellfish allergy Rash 04/02/2016    Family History  Problem Relation Age of Onset   Anuerysm Mother        Brain   Dementia Mother    Diabetes Mother    Heart failure Father    CAD Father    Dementia Father    Diabetes Father    Lupus Sister    Throat cancer Maternal  Grandfather    Diabetes Brother    Breast cancer Neg Hx     Social History   Socioeconomic History   Marital status: Widowed    Spouse name: Not on file   Number of children: 2   Years of education: Not on file   Highest education level: High school graduate  Occupational History   Occupation: retired  Tobacco Use   Smoking status: Never   Smokeless tobacco: Never  Vaping Use   Vaping Use: Never used  Substance and Sexual Activity   Alcohol use: No    Alcohol/week: 0.0 standard drinks of alcohol   Drug use: No   Sexual activity: Not on file  Other Topics Concern   Not on file  Social History Narrative   Not on file   Social Determinants of Health   Financial Resource Strain: Low Risk  (07/10/2022)   Overall Financial Resource Strain (CARDIA)    Difficulty of Paying Living Expenses: Not hard at all  Food Insecurity: No Food Insecurity (07/10/2022)   Hunger Vital Sign    Worried About Running Out of Food in the Last Year: Never true    [place] in the Last Year: Never true  Transportation Needs: No Transportation Needs (07/10/2022)   PRAPARE - Hydrologist (Medical): No    Lack of Transportation (Non-Medical): No  Physical Activity: Insufficiently Active (07/10/2022)   Exercise Vital Sign    Days of Exercise per Week: 1 day    Minutes of Exercise per Session: 10 min  Stress: Stress Concern Present (07/10/2022)   [place]    Feeling of Stress : Rather much  Social Connections: Moderately Isolated (07/10/2022)   Social Connection and Isolation Panel [NHANES]    Frequency of Communication with Friends and Family: More than three times a week    Frequency of Social Gatherings with Friends and Family: Twice a week    Attends Religious Services: More than 4 times per year    Active Member of [employer] or Organizations: No    Attends Archivist Meetings: Never     Marital Status: Widowed  Intimate Partner Violence: Not At Risk (07/10/2022)   Humiliation, Afraid, Rape, and Kick questionnaire    Fear of Current or Ex-Partner: No    Emotionally Abused: No    Physically Abused: No    Sexually Abused: No    Review of Systems: See HPI, otherwise negative ROS  Physical Exam: BP (!) 164/75   Pulse 76   Temp 98.6 F (37 C) (Temporal)   Resp (!) 22   Ht '5\' 3"'$  (1.6 m)   Wt 101.2 kg   SpO2 99%   BMI 39.25 kg/m  General:   Alert, cooperative in NAD Head:  Normocephalic and atraumatic. Respiratory:  Normal work of breathing. Cardiovascular:  RRR  Impression/Plan: [name] is here for cataract surgery.  Risks, benefits,  limitations, and alternatives regarding cataract surgery have been reviewed with the patient.  Questions have been answered.  All parties agreeable.   Benay Pillow, MD  07/14/2022, 7:10 AM

## 2022-07-15 ENCOUNTER — Encounter: Payer: Self-pay | Admitting: Ophthalmology

## 2022-07-15 DIAGNOSIS — H2512 Age-related nuclear cataract, left eye: Secondary | ICD-10-CM | POA: Diagnosis not present

## 2022-07-22 DIAGNOSIS — G5139 Clonic hemifacial spasm, unspecified: Secondary | ICD-10-CM | POA: Diagnosis not present

## 2022-07-22 DIAGNOSIS — G43719 Chronic migraine without aura, intractable, without status migrainosus: Secondary | ICD-10-CM | POA: Diagnosis not present

## 2022-07-22 DIAGNOSIS — G518 Other disorders of facial nerve: Secondary | ICD-10-CM | POA: Diagnosis not present

## 2022-07-23 NOTE — Discharge Instructions (Signed)

## 2022-07-28 ENCOUNTER — Ambulatory Visit
Admission: RE | Admit: 2022-07-28 | Discharge: 2022-07-28 | Disposition: A | Discharge status: Home / Self Care | Payer: Medicare PPO | Attending: Ophthalmology | Admitting: Ophthalmology

## 2022-07-28 ENCOUNTER — Ambulatory Visit: Payer: Medicare PPO | Admitting: Anesthesiology

## 2022-07-28 ENCOUNTER — Encounter: Payer: Self-pay | Admitting: Ophthalmology

## 2022-07-28 ENCOUNTER — Other Ambulatory Visit: Payer: Self-pay

## 2022-07-28 ENCOUNTER — Encounter
Admission: RE | Disposition: A | Discharge status: Home / Self Care | Payer: Self-pay | Source: Home / Self Care | Attending: Ophthalmology

## 2022-07-28 DIAGNOSIS — I10 Essential (primary) hypertension: Secondary | ICD-10-CM | POA: Diagnosis not present

## 2022-07-28 DIAGNOSIS — Z833 Family history of diabetes mellitus: Secondary | ICD-10-CM | POA: Insufficient documentation

## 2022-07-28 DIAGNOSIS — H2512 Age-related nuclear cataract, left eye: Secondary | ICD-10-CM | POA: Insufficient documentation

## 2022-07-28 DIAGNOSIS — E7800 Pure hypercholesterolemia, unspecified: Secondary | ICD-10-CM | POA: Diagnosis not present

## 2022-07-28 DIAGNOSIS — K219 Gastro-esophageal reflux disease without esophagitis: Secondary | ICD-10-CM | POA: Insufficient documentation

## 2022-07-28 DIAGNOSIS — J45909 Unspecified asthma, uncomplicated: Secondary | ICD-10-CM | POA: Diagnosis not present

## 2022-07-28 DIAGNOSIS — Z6839 Body mass index (BMI) 39.0-39.9, adult: Secondary | ICD-10-CM | POA: Diagnosis not present

## 2022-07-28 DIAGNOSIS — E1136 Type 2 diabetes mellitus with diabetic cataract: Secondary | ICD-10-CM | POA: Diagnosis not present

## 2022-07-28 DIAGNOSIS — E119 Type 2 diabetes mellitus without complications: Secondary | ICD-10-CM | POA: Insufficient documentation

## 2022-07-28 HISTORY — PX: CATARACT EXTRACTION W/PHACO: SHX586

## 2022-07-28 SURGERY — PHACOEMULSIFICATION, CATARACT, WITH IOL INSERTION
Anesthesia: Monitor Anesthesia Care | Site: Eye | Laterality: Left

## 2022-07-28 MED ORDER — LACTATED RINGERS IV SOLN: INTRAVENOUS | Status: DC

## 2022-07-28 MED ORDER — SIGHTPATH DOSE#1 BSS IO SOLN
INTRAOCULAR | Status: DC | PRN
  Administered 2022-07-28: 81 mL via OPHTHALMIC

## 2022-07-28 MED ORDER — SIGHTPATH DOSE#1 NA HYALUR & NA CHOND-NA HYALUR IO KIT
PACK | INTRAOCULAR | Status: DC | PRN
  Administered 2022-07-28: 1 via OPHTHALMIC

## 2022-07-28 MED ORDER — ARMC OPHTHALMIC DILATING DROPS
1.0000 | OPHTHALMIC | Status: DC | PRN
  Administered 2022-07-28 (×3): 1 via OPHTHALMIC

## 2022-07-28 MED ORDER — MIDAZOLAM HCL 2 MG/2ML IJ SOLN
INTRAMUSCULAR | Status: DC | PRN
  Administered 2022-07-28 (×2): 1 mg via INTRAVENOUS

## 2022-07-28 MED ORDER — FENTANYL CITRATE (PF) 100 MCG/2ML IJ SOLN
INTRAMUSCULAR | Status: DC | PRN
  Administered 2022-07-28: 25 ug via INTRAVENOUS
  Administered 2022-07-28: 50 ug via INTRAVENOUS
  Administered 2022-07-28: 25 ug via INTRAVENOUS

## 2022-07-28 MED ORDER — ACETAMINOPHEN 325 MG PO TABS: 650.0000 mg | ORAL_TABLET | Freq: Once | ORAL | Status: DC | PRN

## 2022-07-28 MED ORDER — ONDANSETRON HCL 4 MG/2ML IJ SOLN: 4.0000 mg | Freq: Once | INTRAMUSCULAR | Status: DC | PRN

## 2022-07-28 MED ORDER — ACETAMINOPHEN 160 MG/5ML PO SOLN: 325.0000 mg | ORAL | Status: DC | PRN

## 2022-07-28 MED ORDER — LIDOCAINE HCL (PF) 2 % IJ SOLN
INTRAOCULAR | Status: DC | PRN
  Administered 2022-07-28: 1 mL via INTRAOCULAR

## 2022-07-28 MED ORDER — MOXIFLOXACIN HCL 0.5 % OP SOLN
OPHTHALMIC | Status: DC | PRN
  Administered 2022-07-28: .2 mL via OPHTHALMIC

## 2022-07-28 MED ORDER — TETRACAINE HCL 0.5 % OP SOLN
1.0000 [drp] | OPHTHALMIC | Status: DC | PRN
  Administered 2022-07-28 (×3): 1 [drp] via OPHTHALMIC

## 2022-07-28 MED ORDER — SIGHTPATH DOSE#1 BSS IO SOLN
INTRAOCULAR | Status: DC | PRN
  Administered 2022-07-28: 15 mL

## 2022-07-28 SURGICAL SUPPLY — 19 items
CANNULA ANT/CHMB 27G (MISCELLANEOUS) IMPLANT
CANNULA ANT/CHMB 27GA (MISCELLANEOUS) IMPLANT
CATARACT SUITE SIGHTPATH (MISCELLANEOUS) ×1 IMPLANT
DISSECTOR HYDRO NUCLEUS 50X22 (MISCELLANEOUS) ×2 IMPLANT
FEE CATARACT SUITE SIGHTPATH (MISCELLANEOUS) ×2 IMPLANT
GLOVE SURG GAMMEX PI TX LF 7.5 (GLOVE) ×2 IMPLANT
GLOVE SURG SYN 8.5  E (GLOVE) ×1
GLOVE SURG SYN 8.5 E (GLOVE) ×1 IMPLANT
GLOVE SURG SYN 8.5 PF PI (GLOVE) ×2 IMPLANT
LENS IOL TECNIS EYHANCE 22.0 (Intraocular Lens) IMPLANT
NDL FILTER BLUNT 18X1 1/2 (NEEDLE) ×2 IMPLANT
NEEDLE FILTER BLUNT 18X1 1/2 (NEEDLE) ×1 IMPLANT
PACK VIT ANT 23G (MISCELLANEOUS) IMPLANT
RING MALYGIN (MISCELLANEOUS) IMPLANT
SUT ETHILON 10-0 CS-B-6CS-B-6 (SUTURE)
SUTURE EHLN 10-0 CS-B-6CS-B-6 (SUTURE) IMPLANT
SYR 3ML LL SCALE MARK (SYRINGE) ×2 IMPLANT
SYR 5ML LL (SYRINGE) ×2 IMPLANT
WATER STERILE IRR 250ML POUR (IV SOLUTION) ×2 IMPLANT

## 2022-07-28 NOTE — Transfer of Care (Signed)
Immediate Anesthesia Transfer of Care Note  Patient: [name]  Procedure(s) Performed: CATARACT EXTRACTION PHACO AND INTRAOCULAR LENS PLACEMENT (IOC) LEFT DIABETIC (Left: Eye)  Patient Location: PACU  Anesthesia Type: MAC  Level of Consciousness: awake, alert  and patient cooperative  Airway and Oxygen Therapy: Patient Spontanous Breathing and Patient connected to supplemental oxygen  Post-op Assessment: Post-op Vital signs reviewed, Patient's Cardiovascular Status Stable, Respiratory Function Stable, Patent Airway and No signs of Nausea or vomiting  Post-op Vital Signs: Reviewed and stable  Complications: No notable events documented.

## 2022-07-28 NOTE — Anesthesia Postprocedure Evaluation (Signed)
Anesthesia Post Note  Patient: [name]  Procedure(s) Performed: CATARACT EXTRACTION PHACO AND INTRAOCULAR LENS PLACEMENT (IOC) LEFT DIABETIC (Left: Eye)  Patient location during evaluation: PACU Anesthesia Type: MAC Level of consciousness: awake and alert, oriented and patient cooperative Pain management: pain level controlled Vital Signs Assessment: post-procedure vital signs reviewed and stable Respiratory status: spontaneous breathing, nonlabored ventilation and respiratory function stable Cardiovascular status: blood pressure returned to baseline and stable Postop Assessment: adequate PO intake Anesthetic complications: no   No notable events documented.   Last Vitals:  Vitals:   07/28/22 0823 07/28/22 0827  BP: 130/81 112/61  Pulse: 70 72  Resp: 14 16  Temp: (!) 36.3 C (!) 36.3 C  SpO2: 98% 98%    Last Pain:  Vitals:   07/28/22 0827  TempSrc:   PainSc: 0-No pain                 Darrin Nipper

## 2022-07-28 NOTE — Op Note (Signed)
OPERATIVE NOTE  FLOYE SCHNAIDT [id number] 07/28/2022   PREOPERATIVE DIAGNOSIS:  Nuclear sclerotic cataract left eye.  H25.12   POSTOPERATIVE DIAGNOSIS:    Nuclear sclerotic cataract left eye.     PROCEDURE:  Phacoemusification with posterior chamber intraocular lens placement of the left eye   LENS:   Implant Name Type Inv. Item Serial No. Manufacturer Lot No. LRB No. Used Action  LENS IOL TECNIS EYHANCE 22.0 - [id number] Intraocular Lens LENS IOL TECNIS EYHANCE 22.0 [id number] [employer]  Left 1 Implanted      Procedure(s) with comments: CATARACT EXTRACTION PHACO AND INTRAOCULAR LENS PLACEMENT (IOC) LEFT DIABETIC (Left) - 5.72 0:40.7  DIB00 +22.0   ULTRASOUND TIME: 0 minutes 40 seconds.  CDE 5.72   SURGEON:  Benay Pillow, MD, MPH   ANESTHESIA:  Topical with tetracaine drops augmented with 1% preservative-free intracameral lidocaine.  ESTIMATED BLOOD LOSS: <1 mL   COMPLICATIONS:  None.   DESCRIPTION OF PROCEDURE:  The patient was identified in the holding room and transported to the operating room and placed in the supine position under the operating microscope.  The left eye was identified as the operative eye and it was prepped and draped in the usual sterile ophthalmic fashion.   A 1.0 millimeter clear-corneal paracentesis was made at the 5:00 position. 0.5 ml of preservative-free 1% lidocaine with epinephrine was injected into the anterior chamber.  The anterior chamber was filled with viscoelastic.  A 2.4 millimeter keratome was used to make a near-clear corneal incision at the 2:00 position.  A curvilinear capsulorrhexis was made with a cystotome and capsulorrhexis forceps.  Balanced salt solution was used to hydrodissect and hydrodelineate the nucleus.   Phacoemulsification was then used in stop and chop fashion to remove the lens nucleus and epinucleus.  The remaining cortex was then removed using the irrigation and aspiration handpiece. Viscoelastic was then placed into  the capsular bag to distend it for lens placement.  A lens was then injected into the capsular bag.  The remaining viscoelastic was aspirated.   Wounds were hydrated with balanced salt solution.  The anterior chamber was inflated to a physiologic pressure with balanced salt solution.  Intracameral vigamox 0.1 mL undiltued was injected into the eye and a drop placed onto the ocular surface.  No wound leaks were noted.  The patient was taken to the recovery room in stable condition without complications of anesthesia or surgery  Benay Pillow 07/28/2022, 8:22 AM

## 2022-07-28 NOTE — Anesthesia Preprocedure Evaluation (Addendum)
Anesthesia Evaluation  Patient identified by MRN, date of birth, ID band Patient awake    Reviewed: Allergy & Precautions, NPO status , Patient's Chart, lab work & pertinent test results  History of Anesthesia Complications Negative for: history of anesthetic complications  Airway Mallampati: III   Neck ROM: Full    Dental  (+) Missing   Pulmonary asthma    Pulmonary exam normal breath sounds clear to auscultation       Cardiovascular hypertension, Normal cardiovascular exam Rhythm:Regular Rate:Normal     Neuro/Psych  PSYCHIATRIC DISORDERS Anxiety Depression    negative neurological ROS     GI/Hepatic ,GERD  ,,  Endo/Other  diabetes, Type 2  Class 3 obesity  Renal/GU negative Renal ROS     Musculoskeletal  (+) Arthritis ,    Abdominal   Peds  Hematology negative hematology ROS (+)   Anesthesia Other Findings   Reproductive/Obstetrics                             Anesthesia Physical Anesthesia Plan  ASA: 3  Anesthesia Plan: MAC   Post-op Pain Management:    Induction: Intravenous  PONV Risk Score and Plan: 2 and Treatment may vary due to age or medical condition, Midazolam and TIVA  Airway Management Planned: Natural Airway and Nasal Cannula  Additional Equipment:   Intra-op Plan:   Post-operative Plan:   Informed Consent: I have reviewed the patients History and Physical, chart, labs and discussed the procedure including the risks, benefits and alternatives for the proposed anesthesia with the patient or authorized representative who has indicated his/her understanding and acceptance.     Dental advisory given  Plan Discussed with: CRNA  Anesthesia Plan Comments: (LMA/GETA backup discussed.  Patient consented for risks of anesthesia including but not limited to:  - adverse reactions to medications - damage to eyes, teeth, lips or other oral mucosa - nerve damage due  to positioning  - sore throat or hoarseness - damage to heart, brain, nerves, lungs, other parts of body or loss of life  Informed patient about role of CRNA in peri- and intra-operative care.  Patient voiced understanding.)       Anesthesia Quick Evaluation

## 2022-07-28 NOTE — H&P (Signed)
[facility]   Primary Care Physician:  Virginia Crews, MD Ophthalmologist: Dr. Benay Pillow  Pre-Procedure History & Physical: HPI:  [name] is a 72 y.o. female here for cataract surgery.   Past Medical History:  Diagnosis Date   Anxiety    Arthritis    Asthma    Bronchitis 09/04/2016   urgent care   Diabetes mellitus without complication (HCC)    Elevated C-reactive protein (CRP) 10/06/2016   GERD (gastroesophageal reflux disease)    History of shingles    Hyperlipidemia    Hypertension    Neuralgia    Right side of head and face, s/p shingles   Shingles    chronic on Rt side of head and face   Wears dentures    partial upper    Past Surgical History:  Procedure Laterality Date   ABDOMINAL HYSTERECTOMY     BREAST BIOPSY Right 2016   benign- tophat clip   BREAST BIOPSY Left 04/08/2016   -benign-fibroadenomatous changes- tophat clip   BREAST LUMPECTOMY Right 03/2016   BREAST LUMPECTOMY WITH NEEDLE LOCALIZATION Right 05/02/2016   Procedure: BREAST LUMPECTOMY WITH NEEDLE LOCALIZATION;  Surgeon: Clayburn Pert, MD;  Location: [facility];  Service: General;  Laterality: Right;   CATARACT EXTRACTION W/PHACO Right 07/14/2022   Procedure: CATARACT EXTRACTION PHACO AND INTRAOCULAR LENS PLACEMENT ([place]) RIGHT DIABETIC  6.26  00:40.3;  Surgeon: Eulogio Bear, MD;  Location: [place];  Service: Ophthalmology;  Laterality: Right;   CHOLECYSTECTOMY     COLONOSCOPY WITH PROPOFOL N/A 05/02/2019   Procedure: COLONOSCOPY WITH BIOPSY;  Surgeon: Lucilla Lame, MD;  Location: [place];  Service: Endoscopy;  Laterality: N/A;  diabetic - diet controlled   KNEE ARTHROPLASTY Left 12/01/2016   Procedure: COMPUTER ASSISTED TOTAL KNEE ARTHROPLASTY;  Surgeon: Dereck Leep, MD;  Location: [facility];  Service: Orthopedics;  Laterality: Left;   POLYPECTOMY N/A 05/02/2019   Procedure: POLYPECTOMY;  Surgeon: Lucilla Lame, MD;  Location: [place];   Service: Endoscopy;  Laterality: N/A;   TOTAL KNEE ARTHROPLASTY Right 2014   TOTAL KNEE ARTHROPLASTY Right 02/16/2013    Prior to Admission medications   Medication Sig Start Date End Date Taking? Authorizing Provider  atorvastatin (LIPITOR) 80 MG tablet Take 1 tablet (80 mg total) by mouth daily. 11/14/21  Yes Bacigalupo, Dionne Bucy, MD  hydrochlorothiazide (HYDRODIURIL) 12.5 MG tablet Take 1 tablet (12.5 mg total) by mouth daily. 07/10/22  Yes Simmons-Robinson, Makiera, MD  losartan (COZAAR) 100 MG tablet Take 1 tablet (100 mg total) by mouth daily. 07/10/22  Yes Simmons-Robinson, Makiera, MD  omeprazole (PRILOSEC) 20 MG capsule Take 1 capsule (20 mg total) by mouth daily. 07/10/22  Yes Simmons-Robinson, Makiera, MD  pregabalin (LYRICA) 300 MG capsule Take 1 capsule (300 mg total) by mouth 2 (two) times daily. 06/19/22 06/19/23 Yes Bacigalupo, Dionne Bucy, MD  albuterol (VENTOLIN HFA) 108 (90 Base) MCG/ACT inhaler INHALE 1-2 PUFFS BY MOUTH EVERY 6 HOURS AS NEEDED FOR WHEEZE OR SHORTNESS OF BREATH Patient not taking: Reported on 07/14/2022 07/10/22   Simmons-Robinson, Riki Sheer, MD  fluticasone (FLONASE) 50 MCG/ACT nasal spray Place 2 sprays into both nostrils daily. Patient not taking: Reported on 07/14/2022 07/10/22   Simmons-Robinson, Riki Sheer, MD  fluticasone furoate-vilanterol (BREO ELLIPTA) 100-25 MCG/ACT AEPB Inhale 1 puff into the lungs daily. Patient not taking: Reported on 07/14/2022 07/10/22   Simmons-Robinson, Riki Sheer, MD  prednisoLONE acetate (PRED FORTE) 1 % ophthalmic suspension 1 drop daily.    [provider]  Semaglutide,0.25 or 0.5MG/DOS, (OZEMPIC, 0.25 OR 0.5 MG/DOSE,) 2 MG/1.5ML SOPN Inject 0.5 mg into the skin once a week. Patient not taking: Reported on 07/08/2022 11/14/21   Virginia Crews, MD    Allergies as of 06/13/2022 - Review Complete 02/04/2022  Allergen Reaction Noted   Lisinopril Cough 10/31/2014   Lovastatin Other (See Comments) 10/31/2014   Shellfish allergy Rash  04/02/2016    Family History  Problem Relation Age of Onset   Anuerysm Mother        Brain   Dementia Mother    Diabetes Mother    Heart failure Father    CAD Father    Dementia Father    Diabetes Father    Lupus Sister    Throat cancer Maternal Grandfather    Diabetes Brother    Breast cancer Neg Hx     Social History   Socioeconomic History   Marital status: Widowed    Spouse name: Not on file   Number of children: 2   Years of education: Not on file   Highest education level: High school graduate  Occupational History   Occupation: retired  Tobacco Use   Smoking status: Never   Smokeless tobacco: Never  Vaping Use   Vaping Use: Never used  Substance and Sexual Activity   Alcohol use: No    Alcohol/week: 0.0 standard drinks of alcohol   Drug use: No   Sexual activity: Not on file  Other Topics Concern   Not on file  Social History Narrative   Not on file   Social Determinants of Health   Financial Resource Strain: Low Risk  (07/10/2022)   Overall Financial Resource Strain (CARDIA)    Difficulty of Paying Living Expenses: Not hard at all  Food Insecurity: No Food Insecurity (07/10/2022)   Hunger Vital Sign    Worried About Running Out of Food in the Last Year: Never true    [place] in the Last Year: Never true  Transportation Needs: No Transportation Needs (07/10/2022)   PRAPARE - Hydrologist (Medical): No    Lack of Transportation (Non-Medical): No  Physical Activity: Insufficiently Active (07/10/2022)   Exercise Vital Sign    Days of Exercise per Week: 1 day    Minutes of Exercise per Session: 10 min  Stress: Stress Concern Present (07/10/2022)   [name]    Feeling of Stress : Rather much  Social Connections: Moderately Isolated (07/10/2022)   Social Connection and Isolation Panel [NHANES]    Frequency of Communication with Friends and Family: More  than three times a week    Frequency of Social Gatherings with Friends and Family: Twice a week    Attends Religious Services: More than 4 times per year    Active Member of [employer] or Organizations: No    Attends Archivist Meetings: Never    Marital Status: Widowed  Intimate Partner Violence: Not At Risk (07/10/2022)   Humiliation, Afraid, Rape, and Kick questionnaire    Fear of Current or Ex-Partner: No    Emotionally Abused: No    Physically Abused: No    Sexually Abused: No    Review of Systems: See HPI, otherwise negative ROS  Physical Exam: BP (!) 159/77   Pulse 72   Temp 97.7 F (36.5 C) (Temporal)   Resp 18   Ht 5' 3"$  (1.6 m)   Wt 101.2 kg   SpO2 98%  BMI 39.50 kg/m  General:   Alert, cooperative in NAD Head:  Normocephalic and atraumatic. Respiratory:  Normal work of breathing. Cardiovascular:  RRR  Impression/Plan: [name] is here for cataract surgery.  Risks, benefits, limitations, and alternatives regarding cataract surgery have been reviewed with the patient.  Questions have been answered.  All parties agreeable.   Benay Pillow, MD  07/28/2022, 7:56 AM

## 2022-07-29 ENCOUNTER — Encounter: Payer: Self-pay | Admitting: Ophthalmology

## 2022-08-04 ENCOUNTER — Encounter: Payer: Self-pay | Admitting: Podiatry

## 2022-08-04 ENCOUNTER — Ambulatory Visit (INDEPENDENT_AMBULATORY_CARE_PROVIDER_SITE_OTHER): Payer: Medicare PPO | Admitting: Podiatry

## 2022-08-04 DIAGNOSIS — M2012 Hallux valgus (acquired), left foot: Secondary | ICD-10-CM

## 2022-08-04 DIAGNOSIS — E119 Type 2 diabetes mellitus without complications: Secondary | ICD-10-CM | POA: Diagnosis not present

## 2022-08-04 DIAGNOSIS — M21612 Bunion of left foot: Secondary | ICD-10-CM | POA: Diagnosis not present

## 2022-08-04 DIAGNOSIS — M2011 Hallux valgus (acquired), right foot: Secondary | ICD-10-CM | POA: Diagnosis not present

## 2022-08-04 DIAGNOSIS — M21611 Bunion of right foot: Secondary | ICD-10-CM | POA: Diagnosis not present

## 2022-08-04 NOTE — Progress Notes (Signed)
  Subjective:  Patient ID: [name], female    DOB: 29-Jul-1950,  MRN: [id number]  Chief Complaint  Patient presents with   Foot Pain    "I have bunions." N - bunion L - bilateral D - years O - gradually  C - none A - rub shoe T - none    72 y.o. female presents with the above complaint. History confirmed with patient.  The bunions do not hurt.  She has type 2 diabetes that is well-controlled.  Objective:  Physical Exam: warm, good capillary refill, no trophic changes or ulcerative lesions, normal DP and PT pulses, normal sensory exam, and bilateral hallux valgus deformity good smooth range of motion, nontender  Assessment:   1. Hallux valgus with bunions, left   2. Hallux valgus with bunions, right   3. Encounter for diabetic foot exam [facility])      Plan:  Patient was evaluated and treated and all questions answered.   Patient educated on diabetes. Discussed proper diabetic foot care and discussed risks and complications of disease. Educated patient in depth on reasons to return to the office immediately should he/she discover anything concerning or new on the feet. All questions answered. Discussed proper shoes as well.   We discussed etiology and operative and nonoperative treatment of bunion deformity.  Currently it is asymptomatic for her and well-controlled with wider shoes.  She will continue nonoperative treatment and let me know if this does not improve or worsens.  Return to see me as needed  Return if symptoms worsen or fail to improve.

## 2022-08-22 DIAGNOSIS — Z961 Presence of intraocular lens: Secondary | ICD-10-CM | POA: Diagnosis not present

## 2022-08-22 DIAGNOSIS — Z0100 Encounter for examination of eyes and vision without abnormal findings: Secondary | ICD-10-CM | POA: Diagnosis not present

## 2022-10-22 DIAGNOSIS — G518 Other disorders of facial nerve: Secondary | ICD-10-CM | POA: Diagnosis not present

## 2022-10-22 DIAGNOSIS — E538 Deficiency of other specified B group vitamins: Secondary | ICD-10-CM | POA: Diagnosis not present

## 2022-10-28 DIAGNOSIS — R519 Headache, unspecified: Secondary | ICD-10-CM | POA: Diagnosis not present

## 2022-11-07 NOTE — Progress Notes (Unsigned)
I,Hezikiah Retzloff E Shayden Bobier,acting as a scribe for Shirlee Latch, MD.,have documented all relevant documentation on the behalf of Shirlee Latch, MD,as directed by  Shirlee Latch, MD while in the presence of Shirlee Latch, MD.   Established patient visit   Patient: [name]   DOB: April 01, 1951   72 y.o. Female  MRN: [id number] Visit Date: 11/11/2022  Today's healthcare provider: Shirlee Latch, MD   Chief Complaint  Patient presents with   Follow-up   Shortness of Breath   Subjective    HPI  Hypertension, follow-up  BP Readings from Last 3 Encounters:  11/11/22 124/74  07/28/22 112/61  07/14/22 132/80   Wt Readings from Last 3 Encounters:  11/11/22 232 lb (105.2 kg)  07/28/22 223 lb (101.2 kg)  07/14/22 223 lb (101.2 kg)     She was last seen for hypertension 4 months ago.  BP at that visit was 128/76. Management since that visit includes continue HCTZ 12.5mg  daily and losartan 100mg  daily .  She reports excellent compliance with treatment. She is not having side effects.   Outside blood pressures are not being checked. Symptoms: No chest pain No chest pressure  No palpitations No syncope  No dyspnea No orthopnea  No paroxysmal nocturnal dyspnea Yes lower extremity edema   Pertinent labs Lab Results  Component Value Date   CHOL 178 07/10/2022   HDL 43 07/10/2022   LDLCALC 116 (H) 07/10/2022   TRIG 105 07/10/2022   CHOLHDL 4.1 07/10/2022   Lab Results  Component Value Date   NA 141 07/10/2022   K 4.0 07/10/2022   CREATININE 0.97 07/10/2022   EGFR 62 07/10/2022   GLUCOSE 88 07/10/2022   TSH 1.910 03/13/2020     The 10-year ASCVD risk score (Arnett DK, et al., 2019) is: 24.8%  ---------------------------------------------------------------------------------------------------  Asthma Follow-up: She has previously been evaluated here for asthma and presents for an asthma follow-up; she is not currently in exacerbation. Management from last  visit includes continue current regimen, Albuterol and Breo.   Symptoms currently include wheezing and SOB  and occur daily.  Current limitations in activity from asthma: none. Number of Emergency Department visits in the previous month: none.   Patient reports that for the last two months she has been sob with walking short distances. Reports that a months ago going from church to the parking lot she got really sob and felt like she was going to faint and was wheezing a lot and lasted for 5 minutes-10 minutes. Reports that walking from her bathroom to the living room she is getting sob. Aggravated lying down.  Albuterol helps No chest pain   Medications: Outpatient Medications Prior to Visit  Medication Sig   atorvastatin (LIPITOR) 80 MG tablet Take 1 tablet (80 mg total) by mouth daily.   fluticasone (FLONASE) 50 MCG/ACT nasal spray Place 2 sprays into both nostrils daily.   hydrochlorothiazide (HYDRODIURIL) 12.5 MG tablet Take 1 tablet (12.5 mg total) by mouth daily.   losartan (COZAAR) 100 MG tablet Take 1 tablet (100 mg total) by mouth daily.   omeprazole (PRILOSEC) 20 MG capsule Take 1 capsule (20 mg total) by mouth daily.   prednisoLONE acetate (PRED FORTE) 1 % ophthalmic suspension 1 drop daily.   pregabalin (LYRICA) 300 MG capsule Take 1 capsule (300 mg total) by mouth 2 (two) times daily.   [DISCONTINUED] albuterol (VENTOLIN HFA) 108 (90 Base) MCG/ACT inhaler INHALE 1-2 PUFFS BY MOUTH EVERY 6 HOURS AS NEEDED FOR WHEEZE OR  SHORTNESS OF BREATH   [DISCONTINUED] fluticasone furoate-vilanterol (BREO ELLIPTA) 100-25 MCG/ACT AEPB Inhale 1 puff into the lungs daily.   [DISCONTINUED] Semaglutide,0.25 or 0.5MG /DOS, (OZEMPIC, 0.25 OR 0.5 MG/DOSE,) 2 MG/1.5ML SOPN Inject 0.5 mg into the skin once a week.   No facility-administered medications prior to visit.    Review of Systems per HPI     Objective    BP 124/74 (BP Location: Left Arm, Patient Position: Sitting, Cuff Size: Large)    Pulse (!) 101   Temp 97.7 F (36.5 C) (Oral)   Resp 16   Ht 5\' 3"  (1.6 m)   Wt 232 lb (105.2 kg)   SpO2 97%   BMI 41.10 kg/m    Physical Exam Vitals reviewed.  Constitutional:      General: She is not in acute distress.    Appearance: Normal appearance. She is well-developed. She is not diaphoretic.  HENT:     Head: Normocephalic and atraumatic.  Eyes:     General: No scleral icterus.    Conjunctiva/sclera: Conjunctivae normal.  Neck:     Thyroid: No thyromegaly.  Cardiovascular:     Rate and Rhythm: Normal rate and regular rhythm.     Pulses: Normal pulses.     Heart sounds: Normal heart sounds. No murmur heard. Pulmonary:     Effort: Pulmonary effort is normal. No respiratory distress.     Breath sounds: Examination of the right-lower field reveals decreased breath sounds and wheezing. Examination of the left-lower field reveals decreased breath sounds and wheezing. Decreased breath sounds and wheezing (faint) present. No rhonchi or rales.  Musculoskeletal:     Cervical back: Neck supple.     Right lower leg: No edema.     Left lower leg: No edema.  Lymphadenopathy:     Cervical: No cervical adenopathy.  Skin:    General: Skin is warm and dry.     Findings: No rash.  Neurological:     Mental Status: She is alert and oriented to person, place, and time. Mental status is at baseline.  Psychiatric:        Mood and Affect: Mood normal.        Behavior: Behavior normal.       EKG: NSR with no signs of ischemia  No results found for any visits on 11/11/22.  Assessment & Plan     Problem List Items Addressed This Visit       Cardiovascular and Mediastinum   Hypertension associated with diabetes (HCC)    Well controlled Continue current medications Recheck metabolic panel F/u in 6 months       Relevant Medications   Semaglutide,0.25 or 0.5MG /DOS, (OZEMPIC, 0.25 OR 0.5 MG/DOSE,) 2 MG/1.5ML SOPN   Other Relevant Orders   Basic Metabolic Panel (BMET)      Respiratory   Asthma    Chronic and uncotnrolled No acute exacerbation, just general poor baseline control Hr SOB does seem consistent with asthma - assoc with wheezing, better with albuterol, exertional but also worse with laying down EKG is benign Discussed symptoms of angina and emergent precautions Increase breo to higher dose Albuterol prn May need to consider trelegy if not improving F/u in 1 m      Relevant Medications   fluticasone furoate-vilanterol (BREO ELLIPTA) 200-25 MCG/ACT AEPB   albuterol (VENTOLIN HFA) 108 (90 Base) MCG/ACT inhaler     Endocrine   Diabetes (HCC)    Chronic, well controlled previously Repeat A1c Complicated by HTN, HLD, microalbuminuria No changes  to medications UACR today Follow-up in 6 months and repeat A1c      Relevant Medications   Semaglutide,0.25 or 0.5MG /DOS, (OZEMPIC, 0.25 OR 0.5 MG/DOSE,) 2 MG/1.5ML SOPN   Other Relevant Orders   Urine Microalbumin w/creat. ratio   Hemoglobin A1c   Microalbuminuria due to type 2 diabetes mellitus (HCC)    Continue ARB Recheck UACR      Relevant Medications   Semaglutide,0.25 or 0.5MG /DOS, (OZEMPIC, 0.25 OR 0.5 MG/DOSE,) 2 MG/1.5ML SOPN   Other Relevant Orders   Urine Microalbumin w/creat. ratio   Hyperlipidemia associated with type 2 diabetes mellitus (HCC)    Previously well controlled Continue statin Repeat FLP and CMP annually      Relevant Medications   Semaglutide,0.25 or 0.5MG /DOS, (OZEMPIC, 0.25 OR 0.5 MG/DOSE,) 2 MG/1.5ML SOPN   Type 2 diabetes mellitus with stage 3a chronic kidney disease, without long-term current use of insulin (HCC)   Relevant Medications   Semaglutide,0.25 or 0.5MG /DOS, (OZEMPIC, 0.25 OR 0.5 MG/DOSE,) 2 MG/1.5ML SOPN     Other   Morbid obesity (HCC)    BMI 41 and associated with T2DM, HTN, HLD Discussed importance of healthy weight management Discussed diet and exercise       Relevant Medications   Semaglutide,0.25 or 0.5MG /DOS, (OZEMPIC, 0.25 OR 0.5  MG/DOSE,) 2 MG/1.5ML SOPN   Other Visit Diagnoses     SOB (shortness of breath)    -  Primary   Relevant Orders   EKG 12-Lead        Return in about 4 weeks (around 12/09/2022) for asthma follow up.      I, Shirlee Latch, MD, have reviewed all documentation for this visit. The documentation on 11/11/22 for the exam, diagnosis, procedures, and orders are all accurate and complete.   Bacigalupo, Marzella Schlein, MD, MPH [facility]

## 2022-11-11 ENCOUNTER — Encounter: Payer: Self-pay | Admitting: Family Medicine

## 2022-11-11 ENCOUNTER — Ambulatory Visit (INDEPENDENT_AMBULATORY_CARE_PROVIDER_SITE_OTHER): Payer: Medicare PPO | Admitting: Family Medicine

## 2022-11-11 VITALS — BP 124/74 | HR 101 | Temp 97.7°F | Resp 16 | Ht 63.0 in | Wt 232.0 lb

## 2022-11-11 DIAGNOSIS — R0602 Shortness of breath: Secondary | ICD-10-CM | POA: Diagnosis not present

## 2022-11-11 DIAGNOSIS — I152 Hypertension secondary to endocrine disorders: Secondary | ICD-10-CM

## 2022-11-11 DIAGNOSIS — N1831 Chronic kidney disease, stage 3a: Secondary | ICD-10-CM

## 2022-11-11 DIAGNOSIS — E785 Hyperlipidemia, unspecified: Secondary | ICD-10-CM

## 2022-11-11 DIAGNOSIS — E1129 Type 2 diabetes mellitus with other diabetic kidney complication: Secondary | ICD-10-CM

## 2022-11-11 DIAGNOSIS — E1169 Type 2 diabetes mellitus with other specified complication: Secondary | ICD-10-CM

## 2022-11-11 DIAGNOSIS — E1122 Type 2 diabetes mellitus with diabetic chronic kidney disease: Secondary | ICD-10-CM | POA: Insufficient documentation

## 2022-11-11 DIAGNOSIS — J4540 Moderate persistent asthma, uncomplicated: Secondary | ICD-10-CM | POA: Diagnosis not present

## 2022-11-11 DIAGNOSIS — R809 Proteinuria, unspecified: Secondary | ICD-10-CM | POA: Diagnosis not present

## 2022-11-11 DIAGNOSIS — E1159 Type 2 diabetes mellitus with other circulatory complications: Secondary | ICD-10-CM

## 2022-11-11 MED ORDER — ALBUTEROL SULFATE HFA 108 (90 BASE) MCG/ACT IN AERS: INHALATION_SPRAY | RESPIRATORY_TRACT | 2 refills | Status: DC

## 2022-11-11 MED ORDER — FLUTICASONE FUROATE-VILANTEROL 200-25 MCG/ACT IN AEPB: 1.0000 | INHALATION_SPRAY | Freq: Every day | RESPIRATORY_TRACT | 3 refills | Status: DC

## 2022-11-11 MED ORDER — OZEMPIC (0.25 OR 0.5 MG/DOSE) 2 MG/1.5ML ~~LOC~~ SOPN: 0.5000 mg | PEN_INJECTOR | SUBCUTANEOUS | 3 refills | Status: DC

## 2022-11-11 NOTE — Assessment & Plan Note (Signed)
Chronic and uncotnrolled No acute exacerbation, just general poor baseline control Hr SOB does seem consistent with asthma - assoc with wheezing, better with albuterol, exertional but also worse with laying down EKG is benign Discussed symptoms of angina and emergent precautions Increase breo to higher dose Albuterol prn May need to consider trelegy if not improving F/u in 1 m

## 2022-11-11 NOTE — Assessment & Plan Note (Signed)
BMI 41 and associated with T2DM, HTN, HLD Discussed importance of healthy weight management Discussed diet and exercise

## 2022-11-11 NOTE — Assessment & Plan Note (Signed)
Chronic, well controlled previously Repeat A1c Complicated by HTN, HLD, microalbuminuria No changes to medications UACR today Follow-up in 6 months and repeat A1c

## 2022-11-11 NOTE — Assessment & Plan Note (Signed)
Continue ARB Recheck UACR 

## 2022-11-11 NOTE — Assessment & Plan Note (Signed)
Previously well controlled Continue statin Repeat FLP and CMP annually 

## 2022-11-11 NOTE — Assessment & Plan Note (Signed)
Well controlled Continue current medications Recheck metabolic panel F/u in 6 months  

## 2022-11-12 LAB — BASIC METABOLIC PANEL
BUN/Creatinine Ratio: 16 (ref 12–28)
BUN: 15 mg/dL (ref 8–27)
CO2: 22 mmol/L (ref 20–29)
Calcium: 9.5 mg/dL (ref 8.7–10.3)
Chloride: 105 mmol/L (ref 96–106)
Creatinine, Ser: 0.96 mg/dL (ref 0.57–1.00)
Glucose: 87 mg/dL (ref 70–99)
Potassium: 4.1 mmol/L (ref 3.5–5.2)
Sodium: 142 mmol/L (ref 134–144)
eGFR: 62 mL/min/{1.73_m2} (ref >59)

## 2022-11-12 LAB — MICROALBUMIN / CREATININE URINE RATIO
Creatinine, Urine: 224 mg/dL
Microalb/Creat Ratio: 6 mg/g creat (ref 0–29)
Microalbumin, Urine: 12.6 ug/mL

## 2022-11-12 LAB — HEMOGLOBIN A1C
Est. average glucose Bld gHb Est-mCnc: 131 mg/dL
Hgb A1c MFr Bld: 6.2 % — ABNORMAL HIGH (ref 4.8–5.6)

## 2022-12-11 ENCOUNTER — Ambulatory Visit: Payer: Medicare PPO | Admitting: Family Medicine

## 2022-12-29 ENCOUNTER — Other Ambulatory Visit: Payer: Self-pay | Admitting: Family Medicine

## 2022-12-29 NOTE — Telephone Encounter (Signed)
Medication Refill - Medication: pregabalin (LYRICA) 300 MG capsule Needs to have enough when she goes out of town  Has the patient contacted their pharmacy? yes (Agent: If no, request that the patient contact the pharmacy for the refill. If patient does not wish to contact the pharmacy document the reason why and proceed with request.) (Agent: If yes, when and what did the pharmacy advise?)contact pcp  Preferred Pharmacy (with phone number or street name): [facility] - [place], [place] - [address] [place] [address] Phone: [phone]  Fax: [phone]  Has the patient been seen for an appointment in the last year OR does the patient have an upcoming appointment? yes  Agent: Please be advised that RX refills may take up to 3 business days. We ask that you follow-up with your pharmacy.
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
## 2023-01-02 MED ORDER — PREGABALIN 300 MG PO CAPS: 300.0000 mg | ORAL_CAPSULE | Freq: Two times a day (BID) | ORAL | 1 refills | Status: DC

## 2023-01-26 ENCOUNTER — Ambulatory Visit
Admission: EM | Admit: 2023-01-26 | Discharge: 2023-01-26 | Disposition: A | Discharge status: Home / Self Care | Payer: Medicare PPO

## 2023-01-26 DIAGNOSIS — G518 Other disorders of facial nerve: Secondary | ICD-10-CM | POA: Diagnosis not present

## 2023-01-26 DIAGNOSIS — J4521 Mild intermittent asthma with (acute) exacerbation: Secondary | ICD-10-CM | POA: Diagnosis not present

## 2023-01-26 DIAGNOSIS — G501 Atypical facial pain: Secondary | ICD-10-CM | POA: Diagnosis not present

## 2023-01-26 MED ORDER — PREDNISONE 10 MG (21) PO TBPK: ORAL_TABLET | Freq: Every day | ORAL | 0 refills | Status: DC

## 2023-01-26 NOTE — Discharge Instructions (Signed)
Today on exam your lungs are clear and you are getting enough air without assistance and you do not have any signs of distress  I do not have any suspicion that you have pneumonia as you do not have any signs of an upper respiratory illness and I do believe that your asthma is flared therefore we will hold off on completing a chest x-ray  Starting tomorrow morning begin use of prednisone taking as directed with food, this medicine helps to open and relax the airway which will make it easier for you to breathe and will calm your shortness of breath  You are fatigued because your body is having to work harder because you are having to breathe harder, your energy will come back with time  At any point if you feel like your breathing is worsening or if you feel like with use of medicine you are not seeing improvement please return for reevaluation  Continue use of your daily inhaler as prescribed, continue use of your rescue inhaler as needed

## 2023-01-26 NOTE — ED Triage Notes (Signed)
SOB x 3 days. No cough or fever. Hx of asthma

## 2023-01-26 NOTE — ED Provider Notes (Signed)
[facility]    CSN: [id number] Arrival date & time: 01/26/23  1838      History   Chief Complaint Chief Complaint  Patient presents with   Shortness of Breath    HPI [name] is a 72 y.o. female.   Patient presents for evaluation of shortness of breath with exertion and intermittent wheezing present for 2 weeks.  Increased fatigue.  Associated mild nasal congestion.  History of asthma, using daily Breo and albuterol as needed.  No known sick contacts.  Denies fever chills body aches, ear pain, sore throat, cough.    Past Medical History:  Diagnosis Date   Anxiety    Arthritis    Asthma    Bronchitis 09/04/2016   urgent care   Diabetes mellitus without complication (HCC)    Elevated C-reactive protein (CRP) 10/06/2016   GERD (gastroesophageal reflux disease)    History of shingles    Hyperlipidemia    Hypertension    Neuralgia    Right side of head and face, s/p shingles   Shingles    chronic on Rt side of head and face   Wears dentures    partial upper    Patient Active Problem List   Diagnosis Date Noted   Type 2 diabetes mellitus with stage 3a chronic kidney disease, without long-term current use of insulin (HCC) 11/11/2022   Gastroesophageal reflux disease 07/10/2022   Intertriginous candidiasis 10/03/2020   Hyperlipidemia associated with type 2 diabetes mellitus (HCC) 03/06/2020   Benign neoplasm of cecum 01/17/2020   Intractable chronic migraine without aura and without status migrainosus 08/04/2019   Bilateral bunions 10/12/2018   Change in bowel habits 01/29/2018   Postnasal drip 11/04/2017   Other viral warts 11/04/2017   Acute pain of both knees 08/07/2017   Microalbuminuria due to type 2 diabetes mellitus (HCC) 03/26/2017   Primary osteoarthritis involving multiple joints 12/05/2016   Status post total left knee replacement 12/01/2016   Primary osteoarthritis of left knee 09/07/2016   Hypertension associated with diabetes (HCC)  07/24/2016   Occipital headache 01/21/2016   Bilateral occipital neuralgia 01/21/2016   Chronic pain syndrome 05/07/2015   Gasserian ganglionitis 03/12/2015   Neuropathic pain 03/12/2015   Herpes zoster ophthalmicus 03/12/2015   Anxiety 01/11/2015   Diabetes (HCC) 01/11/2015   Photopsia 01/11/2015   Avitaminosis A 01/11/2015   Depression 01/10/2015   Postherpetic neuralgia 11/22/2014   Morbid obesity (HCC) 04/18/2014   Neuropathic postherpetic trigeminal neuralgia (1ry area of Pain) (Right) (V1) 12/21/2013   Cannot sleep 03/13/2009   Asthma 02/07/2009   Calcium deficiency disease 10/16/2008   Avitaminosis D 07/05/2008   History of colon polyps 10/17/2005    Past Surgical History:  Procedure Laterality Date   ABDOMINAL HYSTERECTOMY     BREAST BIOPSY Right 2016   benign- tophat clip   BREAST BIOPSY Left 04/08/2016   -benign-fibroadenomatous changes- tophat clip   BREAST LUMPECTOMY Right 03/2016   BREAST LUMPECTOMY WITH NEEDLE LOCALIZATION Right 05/02/2016   Procedure: BREAST LUMPECTOMY WITH NEEDLE LOCALIZATION;  Surgeon: Ricarda Frame, MD;  Location: [facility];  Service: General;  Laterality: Right;   CATARACT EXTRACTION W/PHACO Right 07/14/2022   Procedure: CATARACT EXTRACTION PHACO AND INTRAOCULAR LENS PLACEMENT (IOC) RIGHT DIABETIC  6.26  00:40.3;  Surgeon: Nevada Crane, MD;  Location: [facility];  Service: Ophthalmology;  Laterality: Right;   CATARACT EXTRACTION W/PHACO Left 07/28/2022   Procedure: CATARACT EXTRACTION PHACO AND INTRAOCULAR LENS PLACEMENT (IOC) LEFT DIABETIC;  Surgeon: Brooke Dare,  Earl Gala, MD;  Location: [facility];  Service: Ophthalmology;  Laterality: Left;  5.72 0:40.7   CHOLECYSTECTOMY     COLONOSCOPY WITH PROPOFOL N/A 05/02/2019   Procedure: COLONOSCOPY WITH BIOPSY;  Surgeon: Midge Minium, MD;  Location: [facility];  Service: Endoscopy;  Laterality: N/A;  diabetic - diet controlled   KNEE ARTHROPLASTY Left 12/01/2016    Procedure: COMPUTER ASSISTED TOTAL KNEE ARTHROPLASTY;  Surgeon: Donato Heinz, MD;  Location: [facility];  Service: Orthopedics;  Laterality: Left;   POLYPECTOMY N/A 05/02/2019   Procedure: POLYPECTOMY;  Surgeon: Midge Minium, MD;  Location: [facility];  Service: Endoscopy;  Laterality: N/A;   TOTAL KNEE ARTHROPLASTY Right 2014   TOTAL KNEE ARTHROPLASTY Right 02/16/2013    OB History     Gravida  2   Para  2   Term      Preterm      AB      Living         SAB      IAB      Ectopic      Multiple      Live Births               Home Medications    Prior to Admission medications   Medication Sig Start Date End Date Taking? Authorizing Provider  albuterol (VENTOLIN HFA) 108 (90 Base) MCG/ACT inhaler INHALE 1-2 PUFFS BY MOUTH EVERY 6 HOURS AS NEEDED FOR WHEEZE OR SHORTNESS OF BREATH 11/11/22  Yes Bacigalupo, Marzella Schlein, MD  atorvastatin (LIPITOR) 80 MG tablet Take 1 tablet (80 mg total) by mouth daily. 11/14/21  Yes Bacigalupo, Marzella Schlein, MD  botulinum toxin Type A (BOTOX) 200 units injection Inject into the muscle. 07/22/22  Yes [provider]  fluticasone (FLONASE) 50 MCG/ACT nasal spray Place 2 sprays into both nostrils daily. 07/10/22  Yes Simmons-Robinson, [place], MD  fluticasone furoate-vilanterol (BREO ELLIPTA) 200-25 MCG/ACT AEPB Inhale 1 puff into the lungs daily. 11/11/22  Yes Bacigalupo, Marzella Schlein, MD  hydrochlorothiazide (HYDRODIURIL) 12.5 MG tablet Take 1 tablet (12.5 mg total) by mouth daily. 07/10/22  Yes Simmons-Robinson, [place], MD  losartan (COZAAR) 100 MG tablet Take 1 tablet (100 mg total) by mouth daily. 07/10/22  Yes Simmons-Robinson, [place], MD  omeprazole (PRILOSEC) 20 MG capsule Take 1 capsule (20 mg total) by mouth daily. 07/10/22  Yes Simmons-Robinson, [place], MD  prednisoLONE acetate (PRED FORTE) 1 % ophthalmic suspension 1 drop daily.   Yes [provider]  pregabalin (LYRICA) 300 MG capsule Take 1 capsule (300 mg total) by  mouth 2 (two) times daily. 01/02/23 01/02/24 Yes Bacigalupo, Marzella Schlein, MD  Semaglutide,0.25 or 0.5MG /DOS, (OZEMPIC, 0.25 OR 0.5 MG/DOSE,) 2 MG/1.5ML SOPN Inject 0.5 mg into the skin once a week. 11/11/22  Yes Bacigalupo, Marzella Schlein, MD    Family History Family History  Problem Relation Age of Onset   Anuerysm Mother        Brain   Dementia Mother    Diabetes Mother    Heart failure Father    CAD Father    Dementia Father    Diabetes Father    Lupus Sister    Throat cancer Maternal Grandfather    Diabetes Brother    Breast cancer Neg Hx     Social History Social History   Tobacco Use   Smoking status: Never   Smokeless tobacco: Never  Vaping Use   Vaping status: Never Used  Substance Use Topics   Alcohol use:  No    Alcohol/week: 0.0 standard drinks of alcohol   Drug use: No     Allergies   Lisinopril, Lovastatin, and Shellfish allergy   Review of Systems Review of Systems  Constitutional: Negative.   HENT: Negative.    Respiratory:  Positive for shortness of breath and wheezing. Negative for apnea, cough, choking, chest tightness and stridor.   Cardiovascular: Negative.   Gastrointestinal: Negative.   Skin: Negative.      Physical Exam Triage Vital Signs ED Triage Vitals  Encounter Vitals Group     BP 01/26/23 1847 (!) 146/94     Systolic BP Percentile --      Diastolic BP Percentile --      Pulse Rate 01/26/23 1847 77     Resp 01/26/23 1847 17     Temp 01/26/23 1847 98 F (36.7 C)     Temp Source 01/26/23 1847 Oral     SpO2 01/26/23 1847 94 %     Weight 01/26/23 1845 230 lb (104.3 kg)     Height --      Head Circumference --      Peak Flow --      Pain Score 01/26/23 1843 0     Pain Loc --      Pain Education --      Exclude from Growth Chart --    No data found.  Updated Vital Signs BP (!) 146/94 (BP Location: Right Arm)   Pulse 77   Temp 98 F (36.7 C) (Oral)   Resp 17   Wt 230 lb (104.3 kg)   SpO2 94%   BMI 40.74 kg/m   Visual  Acuity Right Eye Distance:   Left Eye Distance:   Bilateral Distance:    Right Eye Near:   Left Eye Near:    Bilateral Near:     Physical Exam Constitutional:      Appearance: Normal appearance. She is well-developed.  Eyes:     Extraocular Movements: Extraocular movements intact.  Cardiovascular:     Rate and Rhythm: Normal rate and regular rhythm.     Pulses: Normal pulses.     Heart sounds: Normal heart sounds.  Pulmonary:     Effort: Pulmonary effort is normal.     Breath sounds: Normal breath sounds.  Neurological:     Mental Status: She is alert and oriented to person, place, and time.      UC Treatments / Results  Labs (all labs ordered are listed, but only abnormal results are displayed) Labs Reviewed - No data to display  EKG   Radiology No results found.  Procedures Procedures (including critical care time)  Medications Ordered in UC Medications - No data to display  Initial Impression / Assessment and Plan / UC Course  I have reviewed the triage vital signs and the nursing notes.  Pertinent labs & imaging results that were available during my care of the patient were reviewed by me and considered in my medical decision making (see chart for details).  Mild intermittent asthma with acute exacerbation  Pleasant, well-appearing 72 year old female present on exam, vital signs are stable, O2 saturation 94% on room air and lungs are clear to auscultation, patient is in no signs of distress nontoxic-appearing, etiology of symptoms is most likely asthmatic flare, no signs of infectious cause therefore imaging deferred as there is a very low suspicion for pneumonia, discussed this with patient, prescribed prednisone taper, declined steroid injection in office, given strict return precautions that  if symptoms worsen at any point or if no improvement seen with use of medication she is to return for reevaluation and at that point chest x-ray warranted, verbalized  understanding   Final Clinical Impressions(s) / UC Diagnoses   Final diagnoses:  None   Discharge Instructions   None    ED Prescriptions   None    PDMP not reviewed this encounter.   Valinda Hoar, [place] 01/26/23 [phone]

## 2023-02-13 ENCOUNTER — Other Ambulatory Visit: Payer: Self-pay | Admitting: Family Medicine
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
## 2023-02-13 NOTE — Telephone Encounter (Signed)
Medication Refill - Medication:  pregabalin (LYRICA) 300 MG capsule got 3 or 4 left   Has the patient contacted their pharmacy? No, patient stated she received a letter that stated her PCP needed to send in a new prescription for this medication refill  Preferred Pharmacy (with phone number or street name):  [facility] - [place], [place] - [address]  Phone: [phone] Fax: [phone]  Has the patient been seen for an appointment in the last year OR does the patient have an upcoming appointment? Yes, last seen 11/11/22

## 2023-02-17 ENCOUNTER — Encounter: Payer: Self-pay | Admitting: Family Medicine

## 2023-02-17 ENCOUNTER — Ambulatory Visit (INDEPENDENT_AMBULATORY_CARE_PROVIDER_SITE_OTHER): Payer: Medicare PPO | Admitting: Family Medicine

## 2023-02-17 VITALS — BP 148/60 | HR 87 | Temp 97.6°F | Resp 16 | Ht 63.0 in | Wt 235.7 lb

## 2023-02-17 DIAGNOSIS — J4540 Moderate persistent asthma, uncomplicated: Secondary | ICD-10-CM | POA: Diagnosis not present

## 2023-02-17 DIAGNOSIS — I152 Hypertension secondary to endocrine disorders: Secondary | ICD-10-CM | POA: Diagnosis not present

## 2023-02-17 DIAGNOSIS — Z7985 Long-term (current) use of injectable non-insulin antidiabetic drugs: Secondary | ICD-10-CM | POA: Diagnosis not present

## 2023-02-17 DIAGNOSIS — B0229 Other postherpetic nervous system involvement: Secondary | ICD-10-CM | POA: Diagnosis not present

## 2023-02-17 DIAGNOSIS — Z23 Encounter for immunization: Secondary | ICD-10-CM

## 2023-02-17 DIAGNOSIS — E1169 Type 2 diabetes mellitus with other specified complication: Secondary | ICD-10-CM | POA: Diagnosis not present

## 2023-02-17 DIAGNOSIS — E1159 Type 2 diabetes mellitus with other circulatory complications: Secondary | ICD-10-CM

## 2023-02-17 LAB — POCT GLYCOSYLATED HEMOGLOBIN (HGB A1C): Hemoglobin A1C: 6.3 % — AB (ref 4.0–5.6)

## 2023-02-17 MED ORDER — TRELEGY ELLIPTA 100-62.5-25 MCG/ACT IN AEPB: 1.0000 | INHALATION_SPRAY | Freq: Every day | RESPIRATORY_TRACT | 1 refills | Status: DC

## 2023-02-17 MED ORDER — OZEMPIC (0.25 OR 0.5 MG/DOSE) 2 MG/1.5ML ~~LOC~~ SOPN: 0.5000 mg | PEN_INJECTOR | SUBCUTANEOUS | 3 refills | Status: DC

## 2023-02-17 MED ORDER — PREGABALIN 300 MG PO CAPS: 300.0000 mg | ORAL_CAPSULE | Freq: Two times a day (BID) | ORAL | 0 refills | Status: DC

## 2023-02-17 NOTE — Assessment & Plan Note (Signed)
Chronic and well controlled on Lyrica  Refill of Lyrica sent in

## 2023-02-17 NOTE — Assessment & Plan Note (Addendum)
Chronic and uncontrolled Acute exacerbation in August  Breo was increased to a higher dose in May, per patient reports the increased dose not seem to be any different than prior dose Stop taking Breo, Start taking Trelegy  Continue albuterol prn  Fu in 4 months

## 2023-02-17 NOTE — Assessment & Plan Note (Addendum)
Chronic, relatively well controlled Repeat A1c was good at 6.3  Patient has failed metformin  Continue current medications, including ozempic

## 2023-02-17 NOTE — Progress Notes (Signed)
Established Patient Office Visit  Subjective   Patient ID: [name], female    DOB: [birth year]  Age: 72 y.o. MRN: [id number]  Chief Complaint  Patient presents with   Medical Management of Chronic Issues    Tilla is here for medical management of asthma with recent acute exacerbation on 8/12 where she presented to urgent care.  At baseline her asthma is relatively uncontrolled. Last visit, her daily inhaler dose was increased. She does not think the increased dose helped to control her asthma. She thinks her albuterol works best. She usually gets SOB when she is up exercising or moving around. At the urgent care, they prescribed her a prednisone taper. She is unsure if this helped her SOB or wheezing. She is currently not having any SOB.  Denies symptoms of too high or too low BP. She ran out of her lyrica and is currently in pain.      Review of Systems  Eyes:  Negative for blurred vision.  Respiratory:  Negative for shortness of breath and wheezing.   Neurological:  Negative for dizziness and headaches.     Objective:     BP (!) 148/60 (BP Location: Left Arm, Patient Position: Sitting, Cuff Size: Large)   Pulse 87   Temp 97.6 F (36.4 C) (Temporal)   Resp 16   Ht 5\' 3"  (1.6 m)   Wt 235 lb 11.2 oz (106.9 kg)   SpO2 99%   BMI 41.75 kg/m  BP Readings from Last 3 Encounters:  02/17/23 (!) 148/60  01/26/23 (!) 146/94  11/11/22 124/74   Wt Readings from Last 3 Encounters:  02/17/23 235 lb 11.2 oz (106.9 kg)  01/26/23 230 lb (104.3 kg)  11/11/22 232 lb (105.2 kg)      Physical Exam Constitutional:      Appearance: Normal appearance.  Cardiovascular:     Rate and Rhythm: Normal rate and regular rhythm.     Heart sounds: Normal heart sounds.  Neurological:     General: No focal deficit present.     Mental Status: She is alert and oriented to person, place, and time.    Results for orders placed or performed in visit on 02/17/23  POCT glycosylated  hemoglobin (Hb A1C)  Result Value Ref Range   Hemoglobin A1C 6.3 (A) 4.0 - 5.6 %   The 10-year ASCVD risk score (Arnett DK, et al., 2019) is: 32%    Assessment & Plan:   Problem List Items Addressed This Visit       Cardiovascular and Mediastinum   Hypertension associated with diabetes (HCC)    Chronic  Relatively uncontrolled today, patient ran out of Lyrica and think pain is affecting her BP reading In office BP was elevated 148 / 60 Denies symptoms of elevated or low BP at home  Continue current meds Take home BP readings       Relevant Medications   Semaglutide,0.25 or 0.5MG /DOS, (OZEMPIC, 0.25 OR 0.5 MG/DOSE,) 2 MG/1.5ML SOPN     Respiratory   Asthma - Primary    Chronic and uncontrolled Acute exacerbation in August  Breo was increased to a higher dose in May, per patient reports the increased dose not seem to be any different than prior dose Stop taking Breo, Start taking Trelegy  Continue albuterol prn  Fu in 4 months      Relevant Medications   Fluticasone-Umeclidin-Vilant (TRELEGY ELLIPTA) 100-62.5-25 MCG/ACT AEPB     Endocrine   Diabetes (HCC)    Chronic,  relatively well controlled Repeat A1c was good at 6.3  Patient has failed metformin  Continue current medications, including ozempic      Relevant Medications   Semaglutide,0.25 or 0.5MG /DOS, (OZEMPIC, 0.25 OR 0.5 MG/DOSE,) 2 MG/1.5ML SOPN   Other Relevant Orders   POCT glycosylated hemoglobin (Hb A1C) (Completed)     Nervous and Auditory   Postherpetic neuralgia (Chronic)    Chronic and well controlled on Lyrica  Refill of Lyrica sent in        Other Visit Diagnoses     Encounter for immunization       Relevant Orders   Flu Vaccine Trivalent High Dose (Fluad) (Completed)       Return in about 4 months (around 06/19/2023) for CPE.    Rometta Emery, Medical Student   Patient seen along with MS3 student Jodi Marble. I personally evaluated this patient along with the student, and  verified all aspects of the history, physical exam, and medical decision making as documented by the student. I agree with the student's documentation and have made all necessary edits.  [place] Coco, Marzella Schlein, MD, MPH [facility]

## 2023-02-17 NOTE — Assessment & Plan Note (Addendum)
Chronic  Relatively uncontrolled today, patient ran out of Lyrica and think pain is affecting her BP reading In office BP was elevated 148 / 60 Denies symptoms of elevated or low BP at home  Continue current meds Take home BP readings

## 2023-04-10 DIAGNOSIS — Z961 Presence of intraocular lens: Secondary | ICD-10-CM | POA: Diagnosis not present

## 2023-04-10 DIAGNOSIS — B0229 Other postherpetic nervous system involvement: Secondary | ICD-10-CM | POA: Diagnosis not present

## 2023-04-10 DIAGNOSIS — H26493 Other secondary cataract, bilateral: Secondary | ICD-10-CM | POA: Diagnosis not present

## 2023-04-10 DIAGNOSIS — E119 Type 2 diabetes mellitus without complications: Secondary | ICD-10-CM | POA: Diagnosis not present

## 2023-04-10 LAB — HM DIABETES EYE EXAM

## 2023-04-14 ENCOUNTER — Encounter: Payer: Self-pay | Admitting: Family Medicine

## 2023-04-22 DIAGNOSIS — G518 Other disorders of facial nerve: Secondary | ICD-10-CM | POA: Diagnosis not present

## 2023-06-01 ENCOUNTER — Other Ambulatory Visit: Payer: Self-pay | Admitting: Family Medicine

## 2023-06-01 NOTE — Telephone Encounter (Signed)
Medication Refill -  Most Recent Primary Care Visit:  Provider: Erasmo Downer  Department: [facility]  Visit Type: OFFICE VISIT  Date: 02/17/2023  Medication: pregabalin (LYRICA) 300 MG capsule   Has the patient contacted their pharmacy? Yes  Is this the correct pharmacy for this prescription? No If no, delete pharmacy and type the correct one.  This is the patient's preferred pharmacy: [facility] - [place], [place] - [address] [place] [address] Phone: [phone] Fax: [phone]  Has the prescription been filled recently? No  Is the patient out of the medication? No  Has the patient been seen for an appointment in the last year OR does the patient have an upcoming appointment? Yes  Can we respond through MyChart? Yes  Agent: Please be advised that Rx refills may take up to 3 business days. We ask that you follow-up with your pharmacy.
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
## 2023-06-04 MED ORDER — PREGABALIN 300 MG PO CAPS: 300.0000 mg | ORAL_CAPSULE | Freq: Two times a day (BID) | ORAL | 0 refills | Status: DC

## 2023-06-19 ENCOUNTER — Encounter: Payer: Medicare PPO | Admitting: Family Medicine

## 2023-06-19 NOTE — Progress Notes (Deleted)
 Complete physical exam   Patient: [name]   DOB: [birth year]   73 y.o. Female  MRN: [id number] Visit Date: 06/19/2023  Today's healthcare provider: Jon [name], MD   No chief complaint on file.  Subjective    [name] is a 73 y.o. female who presents today for a complete physical exam.    Discussed the use of AI scribe software for clinical note transcription with the patient, who gave verbal consent to proceed.  History of Present Illness            Last depression screening scores    02/17/2023   11:13 AM 11/11/2022    9:50 AM 07/10/2022    9:09 AM  PHQ 2/9 Scores  PHQ - 2 Score 2 0 0  PHQ- 9 Score 5     Last fall risk screening    02/17/2023   11:13 AM  Fall Risk   Falls in the past year? 0  Number falls in past yr: 0  Injury with Fall? 0  Risk for fall due to : No Fall Risks  Follow up Falls evaluation completed    {VISON DENTAL STD PSA (Optional):27386}  {History (Optional):23778}  Medications: Outpatient Medications Prior to Visit  Medication Sig   albuterol  (VENTOLIN  HFA) 108 (90 Base) MCG/ACT inhaler INHALE 1-2 PUFFS BY MOUTH EVERY 6 HOURS AS NEEDED FOR WHEEZE OR SHORTNESS OF BREATH   atorvastatin  (LIPITOR) 80 MG tablet Take 1 tablet (80 mg total) by mouth daily.   botulinum toxin Type A (BOTOX) 200 units injection Inject into the muscle.   fluticasone  (FLONASE ) 50 MCG/ACT nasal spray Place 2 sprays into both nostrils daily.   Fluticasone -Umeclidin-Vilant (TRELEGY ELLIPTA ) 100-62.5-25 MCG/ACT AEPB Inhale 1 puff into the lungs daily.   hydrochlorothiazide  (HYDRODIURIL ) 12.5 MG tablet Take 1 tablet (12.5 mg total) by mouth daily.   losartan  (COZAAR ) 100 MG tablet Take 1 tablet (100 mg total) by mouth daily.   omeprazole  (PRILOSEC) 20 MG capsule Take 1 capsule (20 mg total) by mouth daily.   prednisoLONE acetate (PRED FORTE) 1 % ophthalmic suspension 1 drop daily.   predniSONE  (STERAPRED UNI-PAK 21 TAB) 10 MG (21) TBPK tablet Take by  mouth daily. Take 6 tabs by mouth daily  for 1 days, then 5 tabs for 1 days, then 4 tabs for 1 days, then 3 tabs for 1 days, 2 tabs for 1 days, then 1 tab by mouth daily for 1 days   pregabalin  (LYRICA ) 300 MG capsule Take 1 capsule (300 mg total) by mouth 2 (two) times daily.   Semaglutide ,0.25 or 0.5MG /DOS, (OZEMPIC , 0.25 OR 0.5 MG/DOSE,) 2 MG/1.5ML SOPN Inject 0.5 mg into the skin once a week.   No facility-administered medications prior to visit.    Review of Systems {Insert previous labs (optional):23779} {See past labs  Heme  Chem  Endocrine  Serology  Results Review (optional):1}  Objective    There were no vitals taken for this visit. {Insert last BP/Wt (optional):23777}{See vitals history (optional):1}  Physical Exam   No results found for any visits on 06/19/23.  Assessment & Plan    Routine Health Maintenance and Physical Exam  Exercise Activities and Dietary recommendations  Goals      DIET - EAT MORE FRUITS AND VEGETABLES     DIET - INCREASE WATER  INTAKE     Recommend to drink at least 6-8 8oz glasses of water  per day.     Have 3 meals a day  Recommend to eat 3 small meals a day with 2 healthy snacks in between.         Immunization History  Administered Date(s) Administered   Fluad Quad(high Dose 65+) 04/05/2019, 03/06/2020, 05/16/2021   Fluad Trivalent(High Dose 65+) 02/17/2023   Influenza Split 04/09/2010, 04/02/2012, 05/26/2012   Influenza, High Dose Seasonal PF 04/04/2014, 02/29/2016, 02/24/2017, 04/09/2018   Moderna Sars-Covid-2 Vaccination 08/29/2019, 09/29/2019, 07/21/2020   Pneumococcal Conjugate-13 02/24/2017   Pneumococcal Polysaccharide-23 04/09/2018   Tdap 07/25/2008   Zoster Recombinant(Shingrix) 06/02/2019, 08/08/2019    Health Maintenance  Topic Date Due   DTaP/Tdap/Td (2 - Td or Tdap) 07/25/2018   COVID-19 Vaccine (4 - 2024-25 season) 02/15/2023   MAMMOGRAM  05/13/2023   Medicare Annual Wellness (AWV)  07/11/2023   FOOT  EXAM  08/05/2023   HEMOGLOBIN A1C  08/17/2023   Diabetic kidney evaluation - eGFR measurement  11/11/2023   Diabetic kidney evaluation - Urine ACR  11/11/2023   OPHTHALMOLOGY EXAM  04/09/2024   Colonoscopy  05/01/2024   Pneumonia Vaccine 54+ Years old  Completed   INFLUENZA VACCINE  Completed   DEXA SCAN  Completed   Hepatitis C Screening  Completed   Zoster Vaccines- Shingrix  Completed   HPV VACCINES  Aged Out    Discussed health benefits of physical activity, and encouraged her to engage in regular exercise appropriate for her age and condition.  Problem List Items Addressed This Visit   None                  No follow-ups on file.     Jon [name], MD  [facility] [phone] (phone) [phone] (fax)  [facility]

## 2023-07-14 ENCOUNTER — Other Ambulatory Visit: Payer: Self-pay | Admitting: Family Medicine

## 2023-07-14 DIAGNOSIS — Z1231 Encounter for screening mammogram for malignant neoplasm of breast: Secondary | ICD-10-CM

## 2023-07-20 ENCOUNTER — Ambulatory Visit
Admission: EM | Admit: 2023-07-20 | Discharge: 2023-07-20 | Disposition: A | Discharge status: Home / Self Care | Payer: Medicare PPO | Attending: Family Medicine | Admitting: Family Medicine

## 2023-07-20 DIAGNOSIS — J101 Influenza due to other identified influenza virus with other respiratory manifestations: Secondary | ICD-10-CM | POA: Diagnosis not present

## 2023-07-20 LAB — RESP PANEL BY RT-PCR (RSV, FLU A&B, COVID)  RVPGX2
Influenza A by PCR: POSITIVE — AB
Influenza B by PCR: NEGATIVE
Resp Syncytial Virus by PCR: NEGATIVE
SARS Coronavirus 2 by RT PCR: NEGATIVE

## 2023-07-20 MED ORDER — HYDROCOD POLI-CHLORPHE POLI ER 10-8 MG/5ML PO SUER: 5.0000 mL | Freq: Two times a day (BID) | ORAL | 0 refills | Status: AC | PRN

## 2023-07-20 NOTE — Discharge Instructions (Signed)
You have influenza A.  Tamiflu was not prescribed at your request.  Your symptoms will gradually improve over the next 7 to 10 days.  The cough may last about 3 weeks.   Take ibuprofen 400 mg with Tylenol 1000 mg for fever, headache or body aches.   For cough:  Stop by the the pharmacy to pick up your prescription cough medication. You can use a humidifier for chest congestion and cough.  If you don't have a humidifier, you can sit in the bathroom with the hot shower running.      For sore throat: try warm salt water gargles, Mucinex sore throat cough drops or cepacol lozenges, throat spray, warm tea or water with lemon/honey, popsicles or ice, or OTC cold relief medicine for throat discomfort. You can also purchase chloraseptic spray at the pharmacy or dollar store.   For congestion: take a daily anti-histamine like Zyrtec, Claritin, and a oral decongestant, such as pseudoephedrine.  You can also use Flonase 1-2 sprays in each nostril daily. Afrin is also a good option, if you do not have high blood pressure.    It is important to stay hydrated: drink plenty of fluids (water, gatorade/powerade/pedialyte, juices, or teas) to keep your throat moisturized and help further relieve irritation/discomfort.    Return or go to the Emergency Department if symptoms worsen or do not improve in the next few days

## 2023-07-20 NOTE — ED Provider Notes (Signed)
[facility]    CSN: [id number] Arrival date & time: 07/20/23  1116      History   Chief Complaint Chief Complaint  Patient presents with   Fever   Sore Throat   Cough    HPI [name] is a 73 y.o. female.   HPI  History obtained from the patient. [name] presents for coughing, chest congestion, headache, rhinorrhea, nasal congestion that started 2 days ago. Sore throat is resolving. Denies fever, vomiting, diarrhea.  Took some Coricidin, Tylenol, Mucinex and Delsym. She slept "all day yesterday." She is feeling somewhat better today.   Has asthma.     Past Medical History:  Diagnosis Date   Anxiety    Arthritis    Asthma    Bronchitis 09/04/2016   urgent care   Diabetes mellitus without complication (HCC)    Elevated C-reactive protein (CRP) 10/06/2016   GERD (gastroesophageal reflux disease)    History of shingles    Hyperlipidemia    Hypertension    Neuralgia    Right side of head and face, s/p shingles   Shingles    chronic on Rt side of head and face   Wears dentures    partial upper    Patient Active Problem List   Diagnosis Date Noted   Type 2 diabetes mellitus with stage 3a chronic kidney disease, without long-term current use of insulin (HCC) 11/11/2022   Gastroesophageal reflux disease 07/10/2022   Intertriginous candidiasis 10/03/2020   Hyperlipidemia associated with type 2 diabetes mellitus (HCC) 03/06/2020   Benign neoplasm of cecum 01/17/2020   Intractable chronic migraine without aura and without status migrainosus 08/04/2019   Bilateral bunions 10/12/2018   Change in bowel habits 01/29/2018   Postnasal drip 11/04/2017   Other viral warts 11/04/2017   Acute pain of both knees 08/07/2017   Microalbuminuria due to type 2 diabetes mellitus (HCC) 03/26/2017   Primary osteoarthritis involving multiple joints 12/05/2016   Status post total left knee replacement 12/01/2016   Primary osteoarthritis of left knee 09/07/2016    Hypertension associated with diabetes (HCC) 07/24/2016   Occipital headache 01/21/2016   Bilateral occipital neuralgia 01/21/2016   Chronic pain syndrome 05/07/2015   Gasserian ganglionitis 03/12/2015   Neuropathic pain 03/12/2015   Herpes zoster ophthalmicus 03/12/2015   Anxiety 01/11/2015   Diabetes (HCC) 01/11/2015   Photopsia 01/11/2015   Avitaminosis A 01/11/2015   Depression 01/10/2015   Postherpetic neuralgia 11/22/2014   Morbid obesity (HCC) 04/18/2014   Neuropathic postherpetic trigeminal neuralgia (1ry area of Pain) (Right) (V1) 12/21/2013   Cannot sleep 03/13/2009   Asthma 02/07/2009   Calcium deficiency disease 10/16/2008   Avitaminosis D 07/05/2008   History of colon polyps 10/17/2005    Past Surgical History:  Procedure Laterality Date   ABDOMINAL HYSTERECTOMY     BREAST BIOPSY Right 2016   benign- tophat clip   BREAST BIOPSY Left 04/08/2016   -benign-fibroadenomatous changes- tophat clip   BREAST LUMPECTOMY Right 03/2016   BREAST LUMPECTOMY WITH NEEDLE LOCALIZATION Right 05/02/2016   Procedure: BREAST LUMPECTOMY WITH NEEDLE LOCALIZATION;  Surgeon: Ricarda Frame, MD;  Location: [facility];  Service: General;  Laterality: Right;   CATARACT EXTRACTION W/PHACO Right 07/14/2022   Procedure: CATARACT EXTRACTION PHACO AND INTRAOCULAR LENS PLACEMENT (IOC) RIGHT DIABETIC  6.26  00:40.3;  Surgeon: Nevada Crane, MD;  Location: [facility];  Service: Ophthalmology;  Laterality: Right;   CATARACT EXTRACTION W/PHACO Left 07/28/2022   Procedure: CATARACT EXTRACTION PHACO AND INTRAOCULAR LENS  PLACEMENT (IOC) LEFT DIABETIC;  Surgeon: Nevada Crane, MD;  Location: [facility];  Service: Ophthalmology;  Laterality: Left;  5.72 0:40.7   CHOLECYSTECTOMY     COLONOSCOPY WITH PROPOFOL N/A 05/02/2019   Procedure: COLONOSCOPY WITH BIOPSY;  Surgeon: Midge Minium, MD;  Location: [facility];  Service: Endoscopy;  Laterality: N/A;  diabetic - diet controlled    KNEE ARTHROPLASTY Left 12/01/2016   Procedure: COMPUTER ASSISTED TOTAL KNEE ARTHROPLASTY;  Surgeon: Donato Heinz, MD;  Location: [facility];  Service: Orthopedics;  Laterality: Left;   POLYPECTOMY N/A 05/02/2019   Procedure: POLYPECTOMY;  Surgeon: Midge Minium, MD;  Location: [facility];  Service: Endoscopy;  Laterality: N/A;   TOTAL KNEE ARTHROPLASTY Right 2014   TOTAL KNEE ARTHROPLASTY Right 02/16/2013    OB History     Gravida  2   Para  2   Term      Preterm      AB      Living         SAB      IAB      Ectopic      Multiple      Live Births               Home Medications    Prior to Admission medications   Medication Sig Start Date End Date Taking? Authorizing Provider  chlorpheniramine-HYDROcodone (TUSSIONEX) 10-8 MG/5ML Take 5 mLs by mouth every 12 (twelve) hours as needed. 07/20/23  Yes Zakar Brosch, DO  hydrochlorothiazide (HYDRODIURIL) 12.5 MG tablet Take 1 tablet (12.5 mg total) by mouth daily. 07/10/22  Yes Simmons-Robinson, Makiera, MD  pregabalin (LYRICA) 300 MG capsule Take 1 capsule (300 mg total) by mouth 2 (two) times daily. 06/04/23  Yes Bacigalupo, Marzella Schlein, MD  albuterol (VENTOLIN HFA) 108 (90 Base) MCG/ACT inhaler INHALE 1-2 PUFFS BY MOUTH EVERY 6 HOURS AS NEEDED FOR WHEEZE OR SHORTNESS OF BREATH 11/11/22   Bacigalupo, Marzella Schlein, MD  atorvastatin (LIPITOR) 80 MG tablet Take 1 tablet (80 mg total) by mouth daily. 11/14/21   Erasmo Downer, MD  botulinum toxin Type A (BOTOX) 200 units injection Inject into the muscle. 07/22/22   [provider]  fluticasone (FLONASE) 50 MCG/ACT nasal spray Place 2 sprays into both nostrils daily. 07/10/22   Simmons-Robinson, Makiera, MD  Fluticasone-Umeclidin-Vilant (TRELEGY ELLIPTA) 100-62.5-25 MCG/ACT AEPB Inhale 1 puff into the lungs daily. 02/17/23   Erasmo Downer, MD  losartan (COZAAR) 100 MG tablet Take 1 tablet (100 mg total) by mouth daily. 07/10/22   Simmons-Robinson, Makiera, MD   omeprazole (PRILOSEC) 20 MG capsule Take 1 capsule (20 mg total) by mouth daily. 07/10/22   Simmons-Robinson, Tawanna Cooler, MD  prednisoLONE acetate (PRED FORTE) 1 % ophthalmic suspension 1 drop daily.    [provider]  predniSONE (STERAPRED UNI-PAK 21 TAB) 10 MG (21) TBPK tablet Take by mouth daily. Take 6 tabs by mouth daily  for 1 days, then 5 tabs for 1 days, then 4 tabs for 1 days, then 3 tabs for 1 days, 2 tabs for 1 days, then 1 tab by mouth daily for 1 days 01/26/23   Valinda Hoar, NP  Semaglutide,0.25 or 0.5MG /DOS, (OZEMPIC, 0.25 OR 0.5 MG/DOSE,) 2 MG/1.5ML SOPN Inject 0.5 mg into the skin once a week. 02/17/23   Bacigalupo, Marzella Schlein, MD    Family History Family History  Problem Relation Age of Onset   Anuerysm Mother        Brain  Dementia Mother    Diabetes Mother    Heart failure Father    CAD Father    Dementia Father    Diabetes Father    Lupus Sister    Throat cancer Maternal Grandfather    Diabetes Brother    Breast cancer Neg Hx     Social History Social History   Tobacco Use   Smoking status: Never   Smokeless tobacco: Never  Vaping Use   Vaping status: Never Used  Substance Use Topics   Alcohol use: No    Alcohol/week: 0.0 standard drinks of alcohol   Drug use: No     Allergies   Lisinopril, Lovastatin, and Shellfish allergy   Review of Systems Review of Systems: negative unless otherwise stated in HPI.      Physical Exam Triage Vital Signs ED Triage Vitals  Encounter Vitals Group     BP      Systolic BP Percentile      Diastolic BP Percentile      Pulse      Resp      Temp      Temp src      SpO2      Weight      Height      Head Circumference      Peak Flow      Pain Score      Pain Loc      Pain Education      Exclude from Growth Chart    No data found.  Updated Vital Signs BP 114/82   Pulse 83   Temp 98.2 F (36.8 C) (Oral)   Resp 19   SpO2 96%   Visual Acuity Right Eye Distance:   Left Eye Distance:    Bilateral Distance:    Right Eye Near:   Left Eye Near:    Bilateral Near:     Physical Exam GEN:     alert, non-toxic appearing female in no distress    HENT:  mucus membranes moist, oropharyngeal without lesions or erythema, no tonsillar hypertrophy or exudates, clear nasal discharge, bilateral TM normal EYES:   pupils equal and reactive, no scleral injection or discharge NECK:  normal ROM, no lymphadenopathy, no meningismus   RESP:  no increased work of breathing, clear to auscultation bilaterally CVS:   regular rate and rhythm Skin:   warm and dry, no rash on visible skin    UC Treatments / Results  Labs (all labs ordered are listed, but only abnormal results are displayed) Labs Reviewed  RESP PANEL BY RT-PCR (RSV, FLU A&B, COVID)  RVPGX2 - Abnormal; Notable for the following components:      Result Value   Influenza A by PCR POSITIVE (*)    All other components within normal limits    EKG   Radiology No results found.  Procedures Procedures (including critical care time)  Medications Ordered in UC Medications - No data to display  Initial Impression / Assessment and Plan / UC Course  I have reviewed the triage vital signs and the nursing notes.  Pertinent labs & imaging results that were available during my care of the patient were reviewed by me and considered in my medical decision making (see chart for details).       Pt is a 73 y.o. female who presents for 2 days of respiratory symptoms. Cele is afebrile here without recent antipyretics. Satting well on room air. Overall pt is non-toxic appearing, well hydrated, without respiratory  distress. Pulmonary exam is unremarkable.  COVID and influenza panel obtained and was  influenza A positive.  After shared decision making, she will defer Tamiflu use at this time. Discussed symptomatic treatment.  Typical duration of symptoms discussed.  Tussionex for cough.  Return and ED precautions given and voiced  understanding. Discussed MDM, treatment plan and plan for follow-up with patient who agrees with plan.     Final Clinical Impressions(s) / UC Diagnoses   Final diagnoses:  Influenza A with respiratory manifestations     Discharge Instructions      You have influenza A.  Tamiflu was not prescribed at your request.  Your symptoms will gradually improve over the next 7 to 10 days.  The cough may last about 3 weeks.   Take ibuprofen 400 mg with Tylenol 1000 mg for fever, headache or body aches.   For cough:  Stop by the the pharmacy to pick up your prescription cough medication. You can use a humidifier for chest congestion and cough.  If you don't have a humidifier, you can sit in the bathroom with the hot shower running.      For sore throat: try warm salt water gargles, Mucinex sore throat cough drops or cepacol lozenges, throat spray, warm tea or water with lemon/honey, popsicles or ice, or OTC cold relief medicine for throat discomfort. You can also purchase chloraseptic spray at the pharmacy or dollar store.   For congestion: take a daily anti-histamine like Zyrtec, Claritin, and a oral decongestant, such as pseudoephedrine.  You can also use Flonase 1-2 sprays in each nostril daily. Afrin is also a good option, if you do not have high blood pressure.    It is important to stay hydrated: drink plenty of fluids (water, gatorade/powerade/pedialyte, juices, or teas) to keep your throat moisturized and help further relieve irritation/discomfort.    Return or go to the Emergency Department if symptoms worsen or do not improve in the next few days      ED Prescriptions     Medication Sig Dispense Auth. Provider   chlorpheniramine-HYDROcodone (TUSSIONEX) 10-8 MG/5ML Take 5 mLs by mouth every 12 (twelve) hours as needed. 115 mL Coron Rossano, Seward Meth, DO      I have reviewed the PDMP during this encounter.   Katha Cabal, DO 07/20/23 1730

## 2023-07-20 NOTE — ED Triage Notes (Signed)
Sx x 2 days  Fever  Cough Chest congestion  Sore throat

## 2023-07-23 ENCOUNTER — Ambulatory Visit: Payer: Medicare PPO

## 2023-08-11 DIAGNOSIS — G518 Other disorders of facial nerve: Secondary | ICD-10-CM | POA: Diagnosis not present

## 2023-08-11 DIAGNOSIS — G519 Disorder of facial nerve, unspecified: Secondary | ICD-10-CM | POA: Diagnosis not present

## 2023-09-18 ENCOUNTER — Other Ambulatory Visit: Payer: Self-pay | Admitting: Family Medicine

## 2023-09-18 NOTE — Telephone Encounter (Signed)
 Copied from CRM [id number]. Topic: Clinical - Medication Refill >> Sep 18, 2023  2:09 PM Clayton Bibles wrote: Most Recent Primary Care Visit:  Provider: Erasmo Downer  Department: [facility]  Visit Type: OFFICE VISIT  Date: 02/17/2023  Medication: pregabalin (LYRICA) 300 MG capsule - 90 Day Supply  Has the patient contacted their pharmacy? Yes (Agent: If no, request that the patient contact the pharmacy for the refill. If patient does not wish to contact the pharmacy document the reason why and proceed with request.) (Agent: If yes, when and what did the pharmacy advise?) Pharmacy needs order to refill  Is this the correct pharmacy for this prescription? Yes If no, delete pharmacy and type the correct one.  This is the patient's preferred pharmacy:  [facility] - [place], [place] - [address] [place] [address] Phone: [phone] Fax: [phone]   Has the prescription been filled recently? No  Is the patient out of the medication? No - About 10 pills left   Has the patient been seen for an appointment in the last year OR does the patient have an upcoming appointment? Yes - She scheduled an appointment for 10/01/23 at 9:40 AM  Can we respond through MyChart? No  Agent: Please be advised that Rx refills may take up to 3 business days. We ask that you follow-up with your pharmacy.

## 2023-09-21 MED ORDER — PREGABALIN 300 MG PO CAPS: 300.0000 mg | ORAL_CAPSULE | Freq: Two times a day (BID) | ORAL | 1 refills | Status: DC

## 2023-09-21 NOTE — Telephone Encounter (Signed)
 Requested medication (s) are due for refill today - yes  Requested medication (s) are on the active medication list -yes  Future visit scheduled -yes  Last refill: 06/04/23 #180  Notes to clinic: non delegated Rx  Requested Prescriptions  Pending Prescriptions Disp Refills   pregabalin (LYRICA) 300 MG capsule 180 capsule 0    Sig: Take 1 capsule (300 mg total) by mouth 2 (two) times daily.     Not Delegated - Neurology:  Anticonvulsants - Controlled - pregabalin Failed - 09/21/2023  9:49 AM      Failed - This refill cannot be delegated      Failed - Valid encounter within last 12 months    Recent Outpatient Visits   None            Passed - Cr in normal range and within 360 days    Creat  Date Value Ref Range Status  03/26/2017 0.96 0.50 - 0.99 mg/dL Final    Comment:    For patients >13 years of age, the reference limit for Creatinine is approximately 13% higher for people identified as African-American. .    Creatinine, Ser  Date Value Ref Range Status  11/11/2022 0.96 0.57 - 1.00 mg/dL Final         Passed - Completed PHQ-2 or PHQ-9 in the last 360 days         Requested Prescriptions  Pending Prescriptions Disp Refills   pregabalin (LYRICA) 300 MG capsule 180 capsule 0    Sig: Take 1 capsule (300 mg total) by mouth 2 (two) times daily.     Not Delegated - Neurology:  Anticonvulsants - Controlled - pregabalin Failed - 09/21/2023  9:49 AM      Failed - This refill cannot be delegated      Failed - Valid encounter within last 12 months    Recent Outpatient Visits   None            Passed - Cr in normal range and within 360 days    Creat  Date Value Ref Range Status  03/26/2017 0.96 0.50 - 0.99 mg/dL Final    Comment:    For patients >102 years of age, the reference limit for Creatinine is approximately 13% higher for people identified as African-American. .    Creatinine, Ser  Date Value Ref Range Status  11/11/2022 0.96 0.57 - 1.00 mg/dL  Final         Passed - Completed PHQ-2 or PHQ-9 in the last 360 days

## 2023-10-01 ENCOUNTER — Ambulatory Visit: Admitting: Family Medicine

## 2023-10-19 ENCOUNTER — Encounter: Payer: Self-pay | Admitting: Family Medicine

## 2023-10-19 ENCOUNTER — Ambulatory Visit (INDEPENDENT_AMBULATORY_CARE_PROVIDER_SITE_OTHER): Admitting: Family Medicine

## 2023-10-19 VITALS — BP 123/72 | HR 81 | Ht 62.0 in | Wt 231.8 lb

## 2023-10-19 DIAGNOSIS — J4540 Moderate persistent asthma, uncomplicated: Secondary | ICD-10-CM | POA: Diagnosis not present

## 2023-10-19 DIAGNOSIS — B0222 Postherpetic trigeminal neuralgia: Secondary | ICD-10-CM

## 2023-10-19 DIAGNOSIS — E1169 Type 2 diabetes mellitus with other specified complication: Secondary | ICD-10-CM | POA: Diagnosis not present

## 2023-10-19 DIAGNOSIS — I152 Hypertension secondary to endocrine disorders: Secondary | ICD-10-CM

## 2023-10-19 DIAGNOSIS — E1159 Type 2 diabetes mellitus with other circulatory complications: Secondary | ICD-10-CM

## 2023-10-19 DIAGNOSIS — H6121 Impacted cerumen, right ear: Secondary | ICD-10-CM

## 2023-10-19 DIAGNOSIS — I10 Essential (primary) hypertension: Secondary | ICD-10-CM

## 2023-10-19 DIAGNOSIS — K219 Gastro-esophageal reflux disease without esophagitis: Secondary | ICD-10-CM

## 2023-10-19 DIAGNOSIS — I872 Venous insufficiency (chronic) (peripheral): Secondary | ICD-10-CM

## 2023-10-19 DIAGNOSIS — E1129 Type 2 diabetes mellitus with other diabetic kidney complication: Secondary | ICD-10-CM

## 2023-10-19 DIAGNOSIS — E785 Hyperlipidemia, unspecified: Secondary | ICD-10-CM

## 2023-10-19 DIAGNOSIS — L989 Disorder of the skin and subcutaneous tissue, unspecified: Secondary | ICD-10-CM

## 2023-10-19 MED ORDER — ATORVASTATIN CALCIUM 80 MG PO TABS: 80.0000 mg | ORAL_TABLET | Freq: Every day | ORAL | 3 refills | Status: AC

## 2023-10-19 MED ORDER — HYDROCHLOROTHIAZIDE 12.5 MG PO TABS: 12.5000 mg | ORAL_TABLET | Freq: Every day | ORAL | 1 refills | Status: AC

## 2023-10-19 MED ORDER — LOSARTAN POTASSIUM 100 MG PO TABS: 100.0000 mg | ORAL_TABLET | Freq: Every day | ORAL | 1 refills | Status: AC

## 2023-10-19 MED ORDER — OMEPRAZOLE 20 MG PO CPDR: 20.0000 mg | DELAYED_RELEASE_CAPSULE | Freq: Every day | ORAL | 3 refills | Status: AC

## 2023-10-19 MED ORDER — DEBROX 6.5 % OT SOLN: 5.0000 [drp] | Freq: Two times a day (BID) | OTIC | 0 refills | Status: AC

## 2023-10-19 MED ORDER — OZEMPIC (0.25 OR 0.5 MG/DOSE) 2 MG/1.5ML ~~LOC~~ SOPN: 0.5000 mg | PEN_INJECTOR | SUBCUTANEOUS | 3 refills | Status: DC

## 2023-10-19 MED ORDER — PREGABALIN 300 MG PO CAPS: 300.0000 mg | ORAL_CAPSULE | Freq: Two times a day (BID) | ORAL | 1 refills | Status: DC

## 2023-10-19 MED ORDER — ALBUTEROL SULFATE HFA 108 (90 BASE) MCG/ACT IN AERS: INHALATION_SPRAY | RESPIRATORY_TRACT | 2 refills | Status: DC

## 2023-10-19 MED ORDER — TRELEGY ELLIPTA 100-62.5-25 MCG/ACT IN AEPB: 1.0000 | INHALATION_SPRAY | Freq: Every day | RESPIRATORY_TRACT | 1 refills | Status: AC

## 2023-10-19 NOTE — Assessment & Plan Note (Signed)
 Hyperlipidemia managed with atorvastatin . - Continue atorvastatin  80 mg daily.

## 2023-10-19 NOTE — Assessment & Plan Note (Signed)
 Chronic pain from shingles managed with Lyrica  and Vimpat per Neuro. Reports ongoing pain despite multiple treatments including Lyrica  and Botox injections. Discussed controlled substance status of Lyrica  and refill process. - Continue Lyrica  as prescribed. - Discuss controlled substance status of Lyrica  and refill process.

## 2023-10-19 NOTE — Progress Notes (Signed)
 Established patient visit   Patient: [name]   DOB: [birth year]   73 y.o. Female  MRN: [id number] Visit Date: 10/19/2023  Today's healthcare provider: Aden Agreste, MD   Chief Complaint  Patient presents with   Medication Refill    Pt reports needing refills on all medications except for her Lyrica .    Ear Fullness    Patient would like to see if she could have right ear irrigated due to feeling compacted associated with dryness and itching. Reports she may possibly need both   Medical Management of Chronic Issues    Symptom: lower leg edema, numbness in hands,   Diabetes   Hyperlipidemia   Hypertension   Subjective    HPI HPI     Medication Refill    Additional comments: Pt reports needing refills on all medications except for her Lyrica .         Ear Fullness    Additional comments: Patient would like to see if she could have right ear irrigated due to feeling compacted associated with dryness and itching. Reports she may possibly need both        Medical Management of Chronic Issues    Additional comments: Symptom: lower leg edema, numbness in hands,      Last edited by Pasty Bongo, CMA on 10/19/2023  1:28 PM.       Discussed the use of AI scribe software for clinical note transcription with the patient, who gave verbal consent to proceed.  History of Present Illness   [name] is a 73 year old female who presents for medication refills and evaluation of ear fullness and leg swelling.  She experiences ear fullness and decreased hearing with itching in both ears. Over-the-counter ear drops, possibly Debrox, have been ineffective. A hearing evaluation a couple of years ago showed normal hearing.  She has leg swelling that worsens throughout the day and improves overnight. She does not use compression socks.  A non-healing lesion on her face has been present for four to five months. It is hard, has not bled or opened, and was initially  thought to be a pimple.  She takes Lyrica  for pain management related to shingles, two tablets at bedtime, and has previously tried Tegretol  and other medications. She also receives Botox injections for pain management.  Her current medications include atorvastatin  80 mg daily, Trelegy inhaler daily, hydrochlorothiazide  12.5 mg daily, losartan  100 mg daily, omeprazole  20 mg daily, and Ozempic  0.5 mg, restarted two to three months ago. She uses an inhaler as needed.         Medications: Outpatient Medications Prior to Visit  Medication Sig   lacosamide (VIMPAT) 50 MG TABS tablet Take 1 tab qAM and 2 tab qhs   botulinum toxin Type A (BOTOX) 200 units injection Inject into the muscle.   chlorpheniramine-HYDROcodone  (TUSSIONEX) 10-8 MG/5ML Take 5 mLs by mouth every 12 (twelve) hours as needed.   fluticasone  (FLONASE ) 50 MCG/ACT nasal spray Place 2 sprays into both nostrils daily.   prednisoLONE acetate (PRED FORTE) 1 % ophthalmic suspension 1 drop daily.   [DISCONTINUED] albuterol  (VENTOLIN  HFA) 108 (90 Base) MCG/ACT inhaler INHALE 1-2 PUFFS BY MOUTH EVERY 6 HOURS AS NEEDED FOR WHEEZE OR SHORTNESS OF BREATH   [DISCONTINUED] atorvastatin  (LIPITOR) 80 MG tablet Take 1 tablet (80 mg total) by mouth daily.   [DISCONTINUED] Fluticasone -Umeclidin-Vilant (TRELEGY ELLIPTA ) 100-62.5-25 MCG/ACT AEPB Inhale 1 puff into the lungs daily.   [DISCONTINUED] hydrochlorothiazide  (HYDRODIURIL ) 12.5  MG tablet Take 1 tablet (12.5 mg total) by mouth daily.   [DISCONTINUED] losartan  (COZAAR ) 100 MG tablet Take 1 tablet (100 mg total) by mouth daily.   [DISCONTINUED] omeprazole  (PRILOSEC) 20 MG capsule Take 1 capsule (20 mg total) by mouth daily.   [DISCONTINUED] predniSONE  (STERAPRED UNI-PAK 21 TAB) 10 MG (21) TBPK tablet Take by mouth daily. Take 6 tabs by mouth daily  for 1 days, then 5 tabs for 1 days, then 4 tabs for 1 days, then 3 tabs for 1 days, 2 tabs for 1 days, then 1 tab by mouth daily for 1 days    [DISCONTINUED] pregabalin  (LYRICA ) 300 MG capsule Take 1 capsule (300 mg total) by mouth 2 (two) times daily.   [DISCONTINUED] Semaglutide ,0.25 or 0.5MG /DOS, (OZEMPIC , 0.25 OR 0.5 MG/DOSE,) 2 MG/1.5ML SOPN Inject 0.5 mg into the skin once a week.   No facility-administered medications prior to visit.    Review of Systems     Objective    BP 123/72 (BP Location: Left Arm, Patient Position: Sitting, Cuff Size: Large)   Pulse 81   Ht 5\' 2"  (1.575 m)   Wt 231 lb 12.8 oz (105.1 kg)   SpO2 97%   BMI 42.40 kg/m    Physical Exam Vitals reviewed.  Constitutional:      General: She is not in acute distress.    Appearance: Normal appearance. She is well-developed. She is not diaphoretic.  HENT:     Head: Normocephalic and atraumatic.  Eyes:     General: No scleral icterus.    Conjunctiva/sclera: Conjunctivae normal.  Neck:     Thyroid : No thyromegaly.  Cardiovascular:     Rate and Rhythm: Normal rate and regular rhythm.     Heart sounds: Normal heart sounds. No murmur heard. Pulmonary:     Effort: Pulmonary effort is normal. No respiratory distress.     Breath sounds: Normal breath sounds. No wheezing, rhonchi or rales.  Musculoskeletal:     Cervical back: Neck supple.     Right lower leg: No edema.     Left lower leg: No edema.  Lymphadenopathy:     Cervical: No cervical adenopathy.  Skin:    General: Skin is warm and dry.     Findings: No rash.  Neurological:     Mental Status: She is alert and oriented to person, place, and time. Mental status is at baseline.  Psychiatric:        Mood and Affect: Mood normal.        Behavior: Behavior normal.      No results found for any visits on 10/19/23.  Assessment & Plan     Problem List Items Addressed This Visit       Cardiovascular and Mediastinum   Hypertension associated with diabetes (HCC)   Blood pressure is well-controlled on current medication regimen. - Continue current antihypertensive medications:  hydrochlorothiazide  12.5 mg daily and losartan  100 mg daily.      Relevant Medications   atorvastatin  (LIPITOR) 80 MG tablet   Semaglutide ,0.25 or 0.5MG /DOS, (OZEMPIC , 0.25 OR 0.5 MG/DOSE,) 2 MG/1.5ML SOPN   hydrochlorothiazide  (HYDRODIURIL ) 12.5 MG tablet   losartan  (COZAAR ) 100 MG tablet   Other Relevant Orders   Comprehensive metabolic panel with GFR     Respiratory   Asthma   Chronic and well controlled Continue trelegy and albuterol  prn      Relevant Medications   albuterol  (VENTOLIN  HFA) 108 (90 Base) MCG/ACT inhaler   Fluticasone -Umeclidin-Vilant (TRELEGY ELLIPTA ) 100-62.5-25 MCG/ACT  AEPB     Digestive   Gastroesophageal reflux disease   Continue PPI      Relevant Medications   omeprazole  (PRILOSEC) 20 MG capsule     Endocrine   Diabetes (HCC) - Primary   Type 2 diabetes managed with Ozempic . Recent restart of Ozempic  approximately 2-3 months ago. - Continue Ozempic  0.5 mg. - Order A1c test.      Relevant Medications   atorvastatin  (LIPITOR) 80 MG tablet   Semaglutide ,0.25 or 0.5MG /DOS, (OZEMPIC , 0.25 OR 0.5 MG/DOSE,) 2 MG/1.5ML SOPN   losartan  (COZAAR ) 100 MG tablet   Other Relevant Orders   Hemoglobin A1c   Microalbumin / creatinine urine ratio   Microalbuminuria due to type 2 diabetes mellitus (HCC)   Relevant Medications   atorvastatin  (LIPITOR) 80 MG tablet   Semaglutide ,0.25 or 0.5MG /DOS, (OZEMPIC , 0.25 OR 0.5 MG/DOSE,) 2 MG/1.5ML SOPN   losartan  (COZAAR ) 100 MG tablet   Other Relevant Orders   Microalbumin / creatinine urine ratio   Hyperlipidemia associated with type 2 diabetes mellitus (HCC)   Hyperlipidemia managed with atorvastatin . - Continue atorvastatin  80 mg daily.      Relevant Medications   atorvastatin  (LIPITOR) 80 MG tablet   Semaglutide ,0.25 or 0.5MG /DOS, (OZEMPIC , 0.25 OR 0.5 MG/DOSE,) 2 MG/1.5ML SOPN   hydrochlorothiazide  (HYDRODIURIL ) 12.5 MG tablet   losartan  (COZAAR ) 100 MG tablet   Other Relevant Orders   Comprehensive  metabolic panel with GFR   Lipid panel     Nervous and Auditory   Neuropathic postherpetic trigeminal neuralgia (1ry area of Pain) (Right) (V1) (Chronic)   Chronic pain from shingles managed with Lyrica  and Vimpat per Neuro. Reports ongoing pain despite multiple treatments including Lyrica  and Botox injections. Discussed controlled substance status of Lyrica  and refill process. - Continue Lyrica  as prescribed. - Discuss controlled substance status of Lyrica  and refill process.      Relevant Medications   lacosamide (VIMPAT) 50 MG TABS tablet   pregabalin  (LYRICA ) 300 MG capsule   Other Visit Diagnoses       Essential hypertension       Relevant Medications   atorvastatin  (LIPITOR) 80 MG tablet   hydrochlorothiazide  (HYDRODIURIL ) 12.5 MG tablet   losartan  (COZAAR ) 100 MG tablet     Impacted cerumen of right ear       Relevant Medications   carbamide peroxide (DEBROX) 6.5 % OTIC solution     Non-healing skin lesion       Relevant Orders   Ambulatory referral to Dermatology     Venous insufficiency       Relevant Medications   atorvastatin  (LIPITOR) 80 MG tablet   hydrochlorothiazide  (HYDRODIURIL ) 12.5 MG tablet   losartan  (COZAAR ) 100 MG tablet       Assessment and Plan    Non-healing skin lesion on face Non-healing lesion on face present for 4-5 months. Differential includes non-melanoma skin cancer such as squamous cell or basal cell carcinoma. - Refer to Dr. Tyrone Gallop, dermatologist in [place], for evaluation and possible biopsy of facial lesion.  Ear fullness and wax buildup Right ear with significant cerumen impaction causing fullness and decreased hearing. Left ear appears clear. - Perform ear irrigation to remove cerumen from right ear.  Gastroesophageal reflux disease (GERD) GERD managed with omeprazole . - Continue omeprazole  20 mg daily.  Leg swelling Chronic leg swelling, improves overnight. Likely due to venous insufficiency. No medication-related causes  identified. - Recommend use of compression socks during the day.  Wellness Visit Routine wellness visit to review medications and address  any new concerns. No acute complaints reported. Discussed scheduling for mammogram and upcoming physical and annual wellness visit. - Review and refill medications as needed. - Perform foot exam. - Discuss mammogram scheduling and provide contact information for [place] location. - Plan for next visit to be a physical and annual wellness visit.          Return in about 6 months (around 04/20/2024) for CPE, AWV.      Total time spent on today's visit was greater than 40 minutes, including both face-to-face time and nonface-to-face time personally spent on review of chart (labs and imaging), discussing labs and goals, discussing further work-up, treatment options, referrals to specialist, answering patient's questions, and coordinating care.   Aden Agreste, MD  [facility] [phone] (phone) [phone] (fax)  [facility]

## 2023-10-19 NOTE — Assessment & Plan Note (Signed)
 Chronic and well controlled Continue trelegy and albuterol  prn

## 2023-10-19 NOTE — Patient Instructions (Signed)
 Call [facility] to schedule a mammogram [phone]

## 2023-10-19 NOTE — Assessment & Plan Note (Signed)
 Continue PPI ?

## 2023-10-19 NOTE — Assessment & Plan Note (Signed)
 Type 2 diabetes managed with Ozempic . Recent restart of Ozempic  approximately 2-3 months ago. - Continue Ozempic  0.5 mg. - Order A1c test.

## 2023-10-19 NOTE — Assessment & Plan Note (Signed)
 Blood pressure is well-controlled on current medication regimen. - Continue current antihypertensive medications: hydrochlorothiazide  12.5 mg daily and losartan  100 mg daily.

## 2023-10-20 ENCOUNTER — Encounter: Payer: Self-pay | Admitting: Family Medicine

## 2023-10-20 ENCOUNTER — Telehealth: Payer: Self-pay | Admitting: Family Medicine

## 2023-10-20 LAB — HEMOGLOBIN A1C
Est. average glucose Bld gHb Est-mCnc: 117 mg/dL
Hgb A1c MFr Bld: 5.7 % — ABNORMAL HIGH (ref 4.8–5.6)

## 2023-10-20 LAB — COMPREHENSIVE METABOLIC PANEL WITH GFR
ALT: 14 IU/L (ref 0–32)
AST: 13 IU/L (ref 0–40)
Albumin: 3.8 g/dL (ref 3.8–4.8)
Alkaline Phosphatase: 72 IU/L (ref 44–121)
BUN/Creatinine Ratio: 12 (ref 12–28)
BUN: 12 mg/dL (ref 8–27)
Bilirubin Total: 0.6 mg/dL (ref 0.0–1.2)
CO2: 24 mmol/L (ref 20–29)
Calcium: 9.6 mg/dL (ref 8.7–10.3)
Chloride: 104 mmol/L (ref 96–106)
Creatinine, Ser: 0.97 mg/dL (ref 0.57–1.00)
Globulin, Total: 2.7 g/dL (ref 1.5–4.5)
Glucose: 85 mg/dL (ref 70–99)
Potassium: 3.8 mmol/L (ref 3.5–5.2)
Sodium: 141 mmol/L (ref 134–144)
Total Protein: 6.5 g/dL (ref 6.0–8.5)
eGFR: 61 mL/min/{1.73_m2} (ref >59)

## 2023-10-20 LAB — LIPID PANEL
Chol/HDL Ratio: 3.6 ratio (ref 0.0–4.4)
Cholesterol, Total: 167 mg/dL (ref 100–199)
HDL: 47 mg/dL (ref >39)
LDL Chol Calc (NIH): 103 mg/dL — ABNORMAL HIGH (ref 0–99)
Triglycerides: 90 mg/dL (ref 0–149)
VLDL Cholesterol Cal: 17 mg/dL (ref 5–40)

## 2023-10-20 LAB — MICROALBUMIN / CREATININE URINE RATIO
Creatinine, Urine: 123.2 mg/dL
Microalb/Creat Ratio: 8 mg/g{creat} (ref 0–29)
Microalbumin, Urine: 10.3 ug/mL

## 2023-10-20 NOTE — Telephone Encounter (Signed)
 [facility] is requesting refill albuterol  (VENTOLIN  HFA) 108 (90 Base) MCG/ACT inhaler  Please advise

## 2023-10-21 MED ORDER — ALBUTEROL SULFATE HFA 108 (90 BASE) MCG/ACT IN AERS: INHALATION_SPRAY | RESPIRATORY_TRACT | 2 refills | Status: AC

## 2023-11-04 ENCOUNTER — Ambulatory Visit
Admission: RE | Admit: 2023-11-04 | Discharge: 2023-11-04 | Disposition: A | Discharge status: Home / Self Care | Source: Ambulatory Visit | Attending: Family Medicine | Admitting: Family Medicine

## 2023-11-04 DIAGNOSIS — Z1231 Encounter for screening mammogram for malignant neoplasm of breast: Secondary | ICD-10-CM | POA: Diagnosis present

## 2023-11-10 ENCOUNTER — Ambulatory Visit: Payer: Self-pay | Admitting: Family Medicine

## 2024-01-08 ENCOUNTER — Ambulatory Visit: Admitting: Physician Assistant

## 2024-01-13 ENCOUNTER — Ambulatory Visit: Payer: Self-pay

## 2024-01-13 NOTE — Telephone Encounter (Signed)
 FYI Only or Action Required?: FYI only for provider.  Patient was last seen in primary care on 10/19/2023 by Myrla Jon HERO, MD.  Called Nurse Triage reporting No chief complaint on file..  Symptoms began several weeks ago.  Interventions attempted: Nothing.  Symptoms are: unchanged.  Triage Disposition: See PCP When Office is Open (Within 3 Days)  Patient/caregiver understands and will follow disposition?: Yes   Copied from CRM [id number]. Topic: Clinical - Red Word Triage >> Jan 13, 2024 12:37 PM Selinda RAMAN wrote: Red Word that prompted transfer to Nurse Triage: The patient called in stating she has been shaking and unsteady for the last few weeks.  She said she has also been dropping things and cannot keep a grip on things. Since it is not getting better I will transfer her to [facility] Reason for Disposition  [1] Weakness of arm / hand, or leg / foot AND [2] is a chronic symptom (recurrent or ongoing AND present > 4 weeks)  Answer Assessment - Initial Assessment Questions 1. SYMPTOM: What is the main symptom you are concerned about? (e.g., weakness, numbness)     Weakness, Shakiness 2. ONSET: When did this start? (e.g., minutes, hours, days; while sleeping)     3 Weakness  3. LAST NORMAL: When was the last time you (the patient) were normal (no symptoms)?     3 Weeks ago  4. PATTERN Does this come and go, or has it been constant since it started?  Is it present now?     Intermittent  5. CARDIAC SYMPTOMS: Have you had any of the following symptoms: chest pain, difficulty breathing, palpitations?     No  6. NEUROLOGIC SYMPTOMS: Have you had any of the following symptoms: headache, dizziness, vision loss, double vision, changes in speech, unsteady on your feet?     Headaches, Dizziness, Gait Disturbance  7. OTHER SYMPTOMS: Do you have any other symptoms? No `      8. PREGNANCY: Is there any chance you are pregnant? When was your last menstrual period?      No and No  Protocols used: Neurologic Deficit-A-AH

## 2024-01-15 ENCOUNTER — Encounter: Payer: Self-pay | Admitting: Family Medicine

## 2024-01-15 ENCOUNTER — Ambulatory Visit (INDEPENDENT_AMBULATORY_CARE_PROVIDER_SITE_OTHER): Admitting: Family Medicine

## 2024-01-15 VITALS — BP 152/108 | HR 88 | Temp 97.9°F | Ht 62.0 in | Wt 225.7 lb

## 2024-01-15 DIAGNOSIS — R270 Ataxia, unspecified: Secondary | ICD-10-CM | POA: Diagnosis not present

## 2024-01-15 DIAGNOSIS — B0222 Postherpetic trigeminal neuralgia: Secondary | ICD-10-CM | POA: Diagnosis not present

## 2024-01-15 DIAGNOSIS — H539 Unspecified visual disturbance: Secondary | ICD-10-CM | POA: Diagnosis not present

## 2024-01-15 DIAGNOSIS — G459 Transient cerebral ischemic attack, unspecified: Secondary | ICD-10-CM

## 2024-01-15 DIAGNOSIS — R251 Tremor, unspecified: Secondary | ICD-10-CM

## 2024-01-15 NOTE — Progress Notes (Signed)
 Established patient visit   Patient: [name]   DOB: 04/01/1951   73 y.o. Female  MRN: [id number] Visit Date: 01/15/2024  Today's healthcare provider: Nancyann Perry, MD   Chief Complaint  Patient presents with   Hypertension    Patient was seen 7/30 at ED for headache, blood pressure was high. Patient reports today feeling dizzy and off balance. I observed patient stumble before stepping on the scale today. Patient thinks this may be due to starting lyrica    Blood pressure was high today, patient reports she has been taking meds    Subjective    Discussed the use of AI scribe software for clinical note transcription with the patient, who gave verbal consent to proceed.  History of Present Illness   [name] is a 73 year old female who presents with fatigue and headaches.  She presents for follow-up after visiting the [facility] Emergency Department two nights ago due to severe headaches and fatigue. The headache was described as the worst she has ever experienced, prompting the emergency visit. In the emergency department, she had a normal comprehensive metabolic panel, normal prothrombin time, slightly elevated white blood cell count, otherwise normal CBC, normal high sensitivity troponin, and a urinalysis showing small leukocyte esterase. A noncontrast CT scan of her head and cervical spine showed multilevel degenerative changes of the cervical spine but no acute intracranial pathology. She received Compazine, Toradol , acetaminophen , and a small fluid bolus, which improved her symptoms.  She has ongoing issues with hand tremors, present for approximately four months, affecting both hands. She describes episodes of losing her balance and believes this may be related to Lyrica , a medication she has been taking for a while. Her balance issues have been ongoing for a while, with episodes lasting a few minutes and sometimes accompanied by blurry vision. She recently fell  while on a train due to not lifting her feet over a rail, which occurred about a week ago.  She has been on Lyrica , 500 mg daily, and Vimpat, which was increased to 100 mg twice a day by her neurologist in May for postherpetic neuralgia. She associates her balance issues with Lyrica .   No neck pain despite the degenerative changes noted in her cervical spine. No family history of heart problems, strokes, or neurologic diseases. She underwent cataract surgery last year and reports some issues with her vision post-surgery, which she discussed with her eye doctor.       Medications: Outpatient Medications Prior to Visit  Medication Sig   albuterol  (VENTOLIN  HFA) 108 (90 Base) MCG/ACT inhaler INHALE 1-2 PUFFS BY MOUTH EVERY 6 HOURS AS NEEDED FOR WHEEZE OR SHORTNESS OF BREATH   atorvastatin  (LIPITOR) 80 MG tablet Take 1 tablet (80 mg total) by mouth daily.   botulinum toxin Type A (BOTOX) 200 units injection Inject into the muscle.   carbamide peroxide (DEBROX) 6.5 % OTIC solution Place 5 drops into the right ear 2 (two) times daily.   chlorpheniramine-HYDROcodone  (TUSSIONEX) 10-8 MG/5ML Take 5 mLs by mouth every 12 (twelve) hours as needed.   fluticasone  (FLONASE ) 50 MCG/ACT nasal spray Place 2 sprays into both nostrils daily.   Fluticasone -Umeclidin-Vilant (TRELEGY ELLIPTA ) 100-62.5-25 MCG/ACT AEPB Inhale 1 puff into the lungs daily.   hydrochlorothiazide  (HYDRODIURIL ) 12.5 MG tablet Take 1 tablet (12.5 mg total) by mouth daily.   lacosamide (VIMPAT) 50 MG TABS tablet Take 1 tab qAM and 2 tab qhs   losartan  (COZAAR ) 100 MG tablet Take 1  tablet (100 mg total) by mouth daily.   omeprazole  (PRILOSEC) 20 MG capsule Take 1 capsule (20 mg total) by mouth daily.   prednisoLONE acetate (PRED FORTE) 1 % ophthalmic suspension 1 drop daily.   pregabalin  (LYRICA ) 300 MG capsule Take 1 capsule (300 mg total) by mouth 2 (two) times daily.   Semaglutide ,0.25 or 0.5MG /DOS, (OZEMPIC , 0.25 OR 0.5 MG/DOSE,) 2  MG/1.5ML SOPN Inject 0.5 mg into the skin once a week.   No facility-administered medications prior to visit.   Review of Systems  Constitutional:  Negative for appetite change, chills, fatigue and fever.  Respiratory:  Negative for chest tightness and shortness of breath.   Cardiovascular:  Negative for chest pain and palpitations.  Gastrointestinal:  Negative for abdominal pain, nausea and vomiting.  Neurological:  Negative for dizziness and weakness.       Objective    BP (!) 152/108 (BP Location: Left Arm, Patient Position: Sitting, Cuff Size: Normal)   Pulse 88   Temp 97.9 F (36.6 C) (Oral)   Ht 5' 2 (1.575 m)   Wt 225 lb 11.2 oz (102.4 kg)   SpO2 97%   BMI 41.28 kg/m   Physical Exam   General appearance: Obese female, cooperative and in no acute distress Head: Normocephalic, without obvious abnormality, atraumatic Respiratory: Respirations even and unlabored, normal respiratory rate Extremities: All extremities are intact.  Skin: Skin color, texture, turgor normal. No rashes seen  Psych: Appropriate mood and affect. Neurologic: Mental status: Alert, oriented to person, place, and time, thought content appropriate. Slight bilateral intention tremor. Negative Romberg. Finger to nose is normal. No nystagmus. . Normal walking gait. No cogwheeling.     Assessment & Plan    1. Ataxia (Primary)  Episodic and lasting a few minutes with each episode. She believes this a side effect of Lyrica  Vimpat, although would expect symptoms to be more persistent if it were a medication side effect. She has stopped Vimpat and reduced dose of Lyrica . She is to follow up with her neurologist at [facility] regarding these medications.   2. Vision changes Poorly characterized but she seems to relate this to episodes of  Need to rule transient vascular events.   3. TIA (transient ischemic attack) (possible diagnosis) Norma non-contrast head CT at ER and unremarkable MRI in 2024 Assess for  vascular disease - US  Carotid Duplex Bilateral; Future  4. Occasional tremors C/with intention tremor. She sees neurology at [facility] as below. Consider local neurology evaluation for tremors.   5. Neuropathic postherpetic trigeminal neuralgia (1ry area of Pain) (Right) (V1)   Continue follow up [facility] neurology.      Nancyann Perry, MD  [facility] [phone] (phone) [phone] (fax)  [facility]

## 2024-01-27 ENCOUNTER — Ambulatory Visit
Admission: RE | Admit: 2024-01-27 | Discharge: 2024-01-27 | Disposition: A | Discharge status: Home / Self Care | Source: Ambulatory Visit | Attending: Family Medicine | Admitting: Family Medicine

## 2024-01-27 DIAGNOSIS — R270 Ataxia, unspecified: Secondary | ICD-10-CM | POA: Diagnosis not present

## 2024-01-27 DIAGNOSIS — G459 Transient cerebral ischemic attack, unspecified: Secondary | ICD-10-CM | POA: Insufficient documentation

## 2024-01-27 DIAGNOSIS — I6523 Occlusion and stenosis of bilateral carotid arteries: Secondary | ICD-10-CM | POA: Diagnosis not present

## 2024-01-27 DIAGNOSIS — H539 Unspecified visual disturbance: Secondary | ICD-10-CM | POA: Diagnosis not present

## 2024-02-03 ENCOUNTER — Ambulatory Visit: Payer: Self-pay | Admitting: Family Medicine

## 2024-03-21 NOTE — Progress Notes (Signed)
 [name]                                          MRN: [id number]   03/21/2024   The [facility] Quality Team Specialist reviewed this patient medical record for the purposes of chart review for care gap closure. The following were reviewed: chart review for care gap closure-controlling blood pressure.    [facility] Quality Team

## 2024-04-21 ENCOUNTER — Encounter: Payer: Self-pay | Admitting: Family Medicine

## 2024-04-21 ENCOUNTER — Ambulatory Visit (INDEPENDENT_AMBULATORY_CARE_PROVIDER_SITE_OTHER): Admitting: Family Medicine

## 2024-04-21 VITALS — BP 139/71 | HR 77 | Ht 62.0 in | Wt 222.0 lb

## 2024-04-21 DIAGNOSIS — Z7985 Long-term (current) use of injectable non-insulin antidiabetic drugs: Secondary | ICD-10-CM

## 2024-04-21 DIAGNOSIS — I10 Essential (primary) hypertension: Secondary | ICD-10-CM | POA: Diagnosis not present

## 2024-04-21 DIAGNOSIS — I152 Hypertension secondary to endocrine disorders: Secondary | ICD-10-CM

## 2024-04-21 DIAGNOSIS — K5903 Drug induced constipation: Secondary | ICD-10-CM | POA: Diagnosis not present

## 2024-04-21 DIAGNOSIS — Z23 Encounter for immunization: Secondary | ICD-10-CM | POA: Diagnosis not present

## 2024-04-21 DIAGNOSIS — E785 Hyperlipidemia, unspecified: Secondary | ICD-10-CM | POA: Diagnosis not present

## 2024-04-21 DIAGNOSIS — D369 Benign neoplasm, unspecified site: Secondary | ICD-10-CM

## 2024-04-21 DIAGNOSIS — Z0001 Encounter for general adult medical examination with abnormal findings: Secondary | ICD-10-CM | POA: Diagnosis not present

## 2024-04-21 DIAGNOSIS — B372 Candidiasis of skin and nail: Secondary | ICD-10-CM | POA: Diagnosis not present

## 2024-04-21 DIAGNOSIS — E1169 Type 2 diabetes mellitus with other specified complication: Secondary | ICD-10-CM

## 2024-04-21 DIAGNOSIS — E1159 Type 2 diabetes mellitus with other circulatory complications: Secondary | ICD-10-CM | POA: Diagnosis not present

## 2024-04-21 DIAGNOSIS — Z0000 Encounter for general adult medical examination without abnormal findings: Secondary | ICD-10-CM

## 2024-04-21 DIAGNOSIS — Z1211 Encounter for screening for malignant neoplasm of colon: Secondary | ICD-10-CM

## 2024-04-21 MED ORDER — CLOTRIMAZOLE 1 % EX CREA: 1.0000 | TOPICAL_CREAM | Freq: Two times a day (BID) | CUTANEOUS | 2 refills | Status: AC

## 2024-04-21 MED ORDER — OZEMPIC (0.25 OR 0.5 MG/DOSE) 2 MG/1.5ML ~~LOC~~ SOPN: 0.5000 mg | PEN_INJECTOR | SUBCUTANEOUS | 3 refills | Status: AC

## 2024-04-21 NOTE — Progress Notes (Signed)
 Annual Wellness Visit     Patient: [name], Female    DOB: 04-29-1951, 73 y.o.   MRN: [id number] Visit Date: 04/21/2024  Today's Provider: Jon [name], MD   Chief Complaint  Patient presents with   Annual Exam    Last completed  Diet -  healthy Exercise - 2 days at least 30 minutes Feeling - fairly well Sleeping - poorly Concerns -  rash under stomach X 1 month that is constant since noticed Goal- Lose weight   Subjective    [name] is a 73 y.o. female who presents today for her Annual Wellness Visit.  Discussed the use of AI scribe software for clinical note transcription with the patient, who gave verbal consent to proceed.  History of Present Illness   [name] is a 73 year old female who presents for a wellness visit and follow-up on previous symptoms suggestive of a stroke.  In August, she experienced confusion and severe pain from trigeminal neuralgia, which was the worst she had ever felt, preventing her from holding her head up. Carotid duplex ultrasound and CT scan of her head and neck showed normal results. She had been on new medication prescribed by her neurologist, which coincided with a significant increase in her blood pressure to over 200. She stopped taking the new medication and has an upcoming appointment with her neurologist in December.  She has a recurring fungal rash under her stomach and down the side of her leg, occurring in skin folds, particularly in warm, moist areas. She previously used a cream that resolved the rash within two days but no longer has it and does not remember the type.  She needs a refill of Ozempic , which she has not been taking regularly. She was on a dose of 0.5 mg weekly but is running out. She experiences stomach pain and constipation after eating, with pain localized in her lower abdomen and associated with audible bubbling.  She is due for a colonoscopy, as her last one five years ago revealed a  tubular adenoma, a precancerous polyp. She has not had an eye exam this year and is due for a tetanus shot, as her last one was in 2010.             Medications: Outpatient Medications Prior to Visit  Medication Sig   albuterol  (VENTOLIN  HFA) 108 (90 Base) MCG/ACT inhaler INHALE 1-2 PUFFS BY MOUTH EVERY 6 HOURS AS NEEDED FOR WHEEZE OR SHORTNESS OF BREATH   atorvastatin  (LIPITOR) 80 MG tablet Take 1 tablet (80 mg total) by mouth daily.   botulinum toxin Type A (BOTOX) 200 units injection Inject into the muscle.   carbamide peroxide (DEBROX) 6.5 % OTIC solution Place 5 drops into the right ear 2 (two) times daily.   chlorpheniramine-HYDROcodone  (TUSSIONEX) 10-8 MG/5ML Take 5 mLs by mouth every 12 (twelve) hours as needed.   fluticasone  (FLONASE ) 50 MCG/ACT nasal spray Place 2 sprays into both nostrils daily.   Fluticasone -Umeclidin-Vilant (TRELEGY ELLIPTA ) 100-62.5-25 MCG/ACT AEPB Inhale 1 puff into the lungs daily.   hydrochlorothiazide  (HYDRODIURIL ) 12.5 MG tablet Take 1 tablet (12.5 mg total) by mouth daily.   lacosamide (VIMPAT) 50 MG TABS tablet Take 1 tab qAM and 2 tab qhs   losartan  (COZAAR ) 100 MG tablet Take 1 tablet (100 mg total) by mouth daily.   omeprazole  (PRILOSEC) 20 MG capsule Take 1 capsule (20 mg total) by mouth daily.   prednisoLONE acetate (PRED FORTE) 1 %  ophthalmic suspension 1 drop daily.   pregabalin  (LYRICA ) 300 MG capsule Take 1 capsule (300 mg total) by mouth 2 (two) times daily.   [DISCONTINUED] Semaglutide ,0.25 or 0.5MG /DOS, (OZEMPIC , 0.25 OR 0.5 MG/DOSE,) 2 MG/1.5ML SOPN Inject 0.5 mg into the skin once a week.   No facility-administered medications prior to visit.    Allergies  Allergen Reactions   Lisinopril Cough   Lovastatin Other (See Comments)    Myalgia   Shellfish Allergy Rash and Dermatitis    Patient Care Team: Myrla Jon HERO, MD as PCP - General (Family Medicine) Mardee, Lynwood SQUIBB, MD as Consulting Physician (Orthopedic  Surgery) Myrna Adine Anes, MD as Consulting Physician (Ophthalmology) Ferrel, Lionel Sotero RIGGERS (Neurology)  Review of Systems       Objective    Vitals: BP 139/71 (BP Location: Left Arm, Patient Position: Sitting, Cuff Size: Normal)   Pulse 77   Ht 5' 2 (1.575 m)   Wt 222 lb (100.7 kg)   SpO2 98%   BMI 40.60 kg/m     Physical Exam Vitals reviewed.  Constitutional:      General: She is not in acute distress.    Appearance: Normal appearance. She is well-developed. She is not diaphoretic.  HENT:     Head: Normocephalic and atraumatic.     Right Ear: Tympanic membrane, ear canal and external ear normal.     Left Ear: Tympanic membrane, ear canal and external ear normal.     Nose: Nose normal.     Mouth/Throat:     Mouth: Mucous membranes are moist.     Pharynx: Oropharynx is clear. No oropharyngeal exudate.  Eyes:     General: No scleral icterus.    Conjunctiva/sclera: Conjunctivae normal.     Pupils: Pupils are equal, round, and reactive to light.  Neck:     Thyroid : No thyromegaly.  Cardiovascular:     Rate and Rhythm: Normal rate and regular rhythm.     Heart sounds: Normal heart sounds. No murmur heard. Pulmonary:     Effort: Pulmonary effort is normal. No respiratory distress.     Breath sounds: Normal breath sounds. No wheezing or rales.  Abdominal:     General: There is no distension.     Palpations: Abdomen is soft.     Tenderness: There is no abdominal tenderness.  Musculoskeletal:        General: No deformity.     Cervical back: Neck supple.     Right lower leg: No edema.     Left lower leg: No edema.  Lymphadenopathy:     Cervical: No cervical adenopathy.  Skin:    General: Skin is warm and dry.     Findings: Rash present.  Neurological:     Mental Status: She is alert and oriented to person, place, and time. Mental status is at baseline.     Gait: Gait normal.  Psychiatric:        Mood and Affect: Mood normal.        Behavior: Behavior  normal.        Thought Content: Thought content normal.      Medicare Wellness     Row Name 04/21/24 0940     Visit info / Clinical Intake:   Medicare Wellness Visit Type: Subsequent Annual Wellness Visit   Medicare Wellness Visit Mode: In-person (required for [employer])   Interpreter Needed? No   Pre-visit prep was completed yes   AWV questionnaire completed by patient prior to visit? no  Living arrangements: lives alone   Typical amount of pain some   Does pain affect daily life? yes   Are you currently prescribed opioids? no     Dietary Habits and Nutritional Risks   How many meals a day? 2   Eats fruit and vegetables daily? no   Most meals are obtained by eating out;preparing own meals   Diabetic: no     Functional Status   Activities of Daily Living (to include ambulation/medication) Independent   Ambulation Independent   Medication Administration Independent   Home Management Independent   Manage your own finances? yes   Primary transportation is driving   Concerns about vision? yes   Concerns about hearing? no     Fall Screening   Falls in the past year? No   Number of falls in past year 1 or less   Was there an injury with Fall? No   Fall Risk Category Calculator 0     Fall Risk   Patient at Risk for Falls Due to No Fall Risks   Fall risk Follow up Falls evaluation completed     Home and Transportation Safety:   All rugs have non-skid backing? no   All stairs or steps have railings? yes   Grab bars in the bathtub or shower? no   Have non-skid surface in bathtub or shower? no   Good home lighting? yes   Regular seat belt use? yes   Hospital stays in the last year: no     Cognitive Assessment   Difficulty concentrating, remembering, or making decisions?  no   Will 6CIT or Mini Cog be Completed no   6CIT or Mini Cog Declined patient alert, oriented, able to answer questions appropriately and recall recent events     Advance Directives (For Healthcare)    Does Patient Have a Medical Advance Directive? Yes   Does patient want to make changes to medical advance directive? No - Patient declined   Type of Advance Directive Living will   Copy of Living Will in Chart? No - copy requested     Reviewed/Updated    Reviewed/Updated All;Medical History;Surgical History;Family History;Medications;Allergies;Care Teams;Patient Goals        Most recent depression screenings:    04/21/2024    9:46 AM 02/17/2023   11:13 AM  PHQ 2/9 Scores  PHQ - 2 Score 0 2  PHQ- 9 Score  5      Data saved with a previous flowsheet row definition    No results found.  No results found for any visits on 04/21/24.  Assessment & Plan     Annual wellness visit done today including the all of the following: Reviewed patient's Family Medical History Reviewed and updated list of patient's medical providers Assessment of cognitive impairment was done Assessed patient's functional ability Established a written schedule for health screening services Health Risk Assessent Completed and Reviewed  Exercise Activities and Dietary recommendations  Goals      DIET - EAT MORE FRUITS AND VEGETABLES     DIET - INCREASE WATER  INTAKE     Recommend to drink at least 6-8 8oz glasses of water  per day.     Have 3 meals a day     Recommend to eat 3 small meals a day with 2 healthy snacks in between.        Immunization History  Administered Date(s) Administered   Fluad Quad(high Dose 65+) 04/05/2019, 03/06/2020, 05/16/2021   Fluad Trivalent(High Dose 65+) 02/17/2023  INFLUENZA, HIGH DOSE SEASONAL PF 04/04/2014, 02/29/2016, 02/24/2017, 04/09/2018, 04/21/2024   Influenza Split 04/09/2010, 04/02/2012, 05/26/2012   Moderna Sars-Covid-2 Vaccination 08/29/2019, 09/29/2019, 07/21/2020   Pneumococcal Conjugate-13 02/24/2017   Pneumococcal Polysaccharide-23 04/09/2018   Tdap 07/25/2008   Zoster Recombinant(Shingrix) 06/02/2019, 08/08/2019    Health Maintenance  Topic Date  Due   DTaP/Tdap/Td (2 - Td or Tdap) 07/25/2018   COVID-19 Vaccine (4 - 2025-26 season) 02/15/2024   OPHTHALMOLOGY EXAM  04/09/2024   HEMOGLOBIN A1C  04/20/2024   Colonoscopy  05/01/2024   Diabetic kidney evaluation - eGFR measurement  10/18/2024   Diabetic kidney evaluation - Urine ACR  10/18/2024   FOOT EXAM  10/18/2024   Mammogram  11/03/2024   Medicare Annual Wellness (AWV)  04/21/2025   Pneumococcal Vaccine: 50+ Years  Completed   Influenza Vaccine  Completed   DEXA SCAN  Completed   Hepatitis C Screening  Completed   Zoster Vaccines- Shingrix  Completed   Meningococcal B Vaccine  Aged Out     Discussed health benefits of physical activity, and encouraged her to engage in regular exercise appropriate for her age and condition.    Problem List Items Addressed This Visit       Cardiovascular and Mediastinum   Hypertension associated with diabetes (HCC)   Well controlled Continue current medications Recheck metabolic panel      Relevant Medications   Semaglutide ,0.25 or 0.5MG /DOS, (OZEMPIC , 0.25 OR 0.5 MG/DOSE,) 2 MG/1.5ML SOPN     Endocrine   Diabetes (HCC)   Managed with Ozempic . Reports running low on medication and inconsistent adherence. Discussed potential gastrointestinal side effects of Ozempic , including stomach pain and constipation. - Prescribed Ozempic  0.5 mg weekly - Advised on increasing fiber intake to manage constipation      Relevant Medications   Semaglutide ,0.25 or 0.5MG /DOS, (OZEMPIC , 0.25 OR 0.5 MG/DOSE,) 2 MG/1.5ML SOPN   Other Relevant Orders   Hemoglobin A1c   Hyperlipidemia associated with type 2 diabetes mellitus (HCC)   Hyperlipidemia managed with atorvastatin . - Continue atorvastatin  80 mg daily.      Relevant Medications   Semaglutide ,0.25 or 0.5MG /DOS, (OZEMPIC , 0.25 OR 0.5 MG/DOSE,) 2 MG/1.5ML SOPN   Other Relevant Orders   Lipid Panel With LDL/HDL Ratio     Musculoskeletal and Integument   Intertriginous candidiasis   Relevant  Medications   clotrimazole (LOTRIMIN) 1 % cream   Other Visit Diagnoses       Encounter for annual wellness visit (AWV) in Medicare patient    -  Primary     Encounter for annual physical exam         Essential hypertension       Relevant Orders   Comprehensive metabolic panel with GFR     Immunization due       Relevant Orders   Flu vaccine HIGH DOSE PF(Fluzone Trivalent) (Completed)     Tubular adenoma       Relevant Orders   Ambulatory referral to Gastroenterology     Colon cancer screening       Relevant Orders   Ambulatory referral to Gastroenterology     Drug-induced constipation               Adult Wellness Visit Routine wellness visit conducted. Discussed general health maintenance and screenings. - Performed blood work including A1c, cholesterol, kidney and liver function tests - Administered flu shot - Scheduled follow-up in 6 months  Candidiasis of skin Recurrent fungal rash in skin folds under the stomach and down the  side of the leg. Previous treatment with antifungal cream was effective. - Prescribed antifungal cream - Advised on over-the-counter options like clotrimazole  Constipation due to medication Constipation likely secondary to Ozempic  use. Discussed dietary modifications to manage symptoms. - Recommended fiber supplements such as Benefiber or Metamucil  Colorectal cancer screening Due for colonoscopy as previous tubular adenoma was removed five years ago. Discussed the importance of screening given the precancerous nature of the previous polyp. - Referred to Dr. Edith for colonoscopy  Immunization Due for tetanus booster. Last recorded tetanus shot was in 2010. Discussed the importance of staying up-to-date with vaccinations. - Advised to obtain tetanus booster at pharmacy - Advised to wait two weeks between flu shot and tetanus booster        Return in about 6 months (around 10/19/2024) for chronic disease f/u.     Jon [name], MD   [facility] [phone] (phone) [phone] (fax)  [facility]

## 2024-04-21 NOTE — Assessment & Plan Note (Signed)
 Well controlled Continue current medications Recheck metabolic panel

## 2024-04-21 NOTE — Assessment & Plan Note (Signed)
 Hyperlipidemia managed with atorvastatin . - Continue atorvastatin  80 mg daily.

## 2024-04-21 NOTE — Patient Instructions (Signed)
 [name],  Thank you for taking the time for your Medicare Wellness Visit. I appreciate your continued commitment to your health goals. Please review the care plan we discussed, and feel free to reach out if I can assist you further.  Please note that Annual Wellness Visits do not include a physical exam. Some assessments may be limited, especially if the visit was conducted virtually. If needed, we may recommend an in-person follow-up with your provider.  Ongoing Care Seeing your primary care provider every 3 to 6 months helps us  monitor your health and provide consistent, personalized care.   Referrals If a referral was made during today's visit and you haven't received any updates within two weeks, please contact the referred provider directly to check on the status.  Recommended Screenings:  Health Maintenance  Topic Date Due   DTaP/Tdap/Td vaccine (2 - Td or Tdap) 07/25/2018   COVID-19 Vaccine (4 - 2025-26 season) 02/15/2024   Eye exam for diabetics  04/09/2024   Hemoglobin A1C  04/20/2024   Colon Cancer Screening  05/01/2024   Yearly kidney function blood test for diabetes  10/18/2024   Yearly kidney health urinalysis for diabetes  10/18/2024   Complete foot exam   10/18/2024   Breast Cancer Screening  11/03/2024   Medicare Annual Wellness Visit  04/21/2025   Pneumococcal Vaccine for age over 10  Completed   Flu Shot  Completed   DEXA scan (bone density measurement)  Completed   Hepatitis C Screening  Completed   Zoster (Shingles) Vaccine  Completed   Meningitis B Vaccine  Aged Out       04/21/2024    9:40 AM  Advanced Directives  Does Patient Have a Medical Advance Directive? Yes  Type of Advance Directive Living will  Does patient want to make changes to medical advance directive? No - Patient declined    Vision: Annual vision screenings are recommended for early detection of glaucoma, cataracts, and diabetic retinopathy. These exams can also reveal signs of  chronic conditions such as diabetes and high blood pressure.  Dental: Annual dental screenings help detect early signs of oral cancer, gum disease, and other conditions linked to overall health, including heart disease and diabetes.  Please see the attached documents for additional preventive care recommendations.

## 2024-04-21 NOTE — Assessment & Plan Note (Signed)
 Managed with Ozempic . Reports running low on medication and inconsistent adherence. Discussed potential gastrointestinal side effects of Ozempic , including stomach pain and constipation. - Prescribed Ozempic  0.5 mg weekly - Advised on increasing fiber intake to manage constipation

## 2024-04-22 LAB — COMPREHENSIVE METABOLIC PANEL WITH GFR
ALT: 10 IU/L (ref 0–32)
AST: 9 IU/L (ref 0–40)
Albumin: 4 g/dL (ref 3.8–4.8)
Alkaline Phosphatase: 63 IU/L (ref 49–135)
BUN/Creatinine Ratio: 10 — ABNORMAL LOW (ref 12–28)
BUN: 11 mg/dL (ref 8–27)
Bilirubin Total: 0.8 mg/dL (ref 0.0–1.2)
CO2: 23 mmol/L (ref 20–29)
Calcium: 9.6 mg/dL (ref 8.7–10.3)
Chloride: 103 mmol/L (ref 96–106)
Creatinine, Ser: 1.09 mg/dL — ABNORMAL HIGH (ref 0.57–1.00)
Globulin, Total: 2.5 g/dL (ref 1.5–4.5)
Glucose: 89 mg/dL (ref 70–99)
Potassium: 4.2 mmol/L (ref 3.5–5.2)
Sodium: 141 mmol/L (ref 134–144)
Total Protein: 6.5 g/dL (ref 6.0–8.5)
eGFR: 53 mL/min/1.73 — ABNORMAL LOW (ref >59)

## 2024-04-22 LAB — LIPID PANEL WITH LDL/HDL RATIO
Cholesterol, Total: 189 mg/dL (ref 100–199)
HDL: 39 mg/dL — ABNORMAL LOW (ref >39)
LDL Chol Calc (NIH): 128 mg/dL — ABNORMAL HIGH (ref 0–99)
LDL/HDL Ratio: 3.3 ratio — ABNORMAL HIGH (ref 0.0–3.2)
Triglycerides: 123 mg/dL (ref 0–149)
VLDL Cholesterol Cal: 22 mg/dL (ref 5–40)

## 2024-04-22 LAB — HEMOGLOBIN A1C
Est. average glucose Bld gHb Est-mCnc: 114 mg/dL
Hgb A1c MFr Bld: 5.6 % (ref 4.8–5.6)

## 2024-04-26 ENCOUNTER — Ambulatory Visit: Payer: Self-pay | Admitting: Family Medicine

## 2024-04-26 ENCOUNTER — Telehealth: Payer: Self-pay

## 2024-04-26 ENCOUNTER — Other Ambulatory Visit: Payer: Self-pay

## 2024-04-26 DIAGNOSIS — Z860100 Personal history of colon polyps, unspecified: Secondary | ICD-10-CM

## 2024-04-26 DIAGNOSIS — E1159 Type 2 diabetes mellitus with other circulatory complications: Secondary | ICD-10-CM

## 2024-04-26 DIAGNOSIS — E1169 Type 2 diabetes mellitus with other specified complication: Secondary | ICD-10-CM

## 2024-04-26 MED ORDER — NA SULFATE-K SULFATE-MG SULF 17.5-3.13-1.6 GM/177ML PO SOLN: 1.0000 | Freq: Once | ORAL | 0 refills | Status: AC

## 2024-04-26 NOTE — Telephone Encounter (Signed)
 Gastroenterology Pre-Procedure Review  Request Date: 07/11/24 Requesting Physician: Dr. Melany  PATIENT REVIEW QUESTIONS: The patient responded to the following health history questions as indicated:    1. Are you having any GI issues? no 2. Do you have a personal history of Polyps? yes (last colonoscopy performed by Dr. Jinny 05/02/2019 recommended repeat in 5 years) 3. Do you have a family history of Colon Cancer or Polyps? no 4. Diabetes Mellitus? yes (has been advised to stop semaglutied 7 days prior to colonoscopy noted on instructions) 5. Joint replacements in the past 12 months?no 6. Major health problems in the past 3 months?no 7. Any artificial heart valves, MVP, or defibrillator?no    MEDICATIONS & ALLERGIES:    Patient reports the following regarding taking any anticoagulation/antiplatelet therapy:   Plavix, Coumadin, Eliquis, Xarelto, Lovenox , Pradaxa, Brilinta, or Effient? no Aspirin? no  Patient confirms/reports the following medications:  Current Outpatient Medications  Medication Sig Dispense Refill   albuterol  (VENTOLIN  HFA) 108 (90 Base) MCG/ACT inhaler INHALE 1-2 PUFFS BY MOUTH EVERY 6 HOURS AS NEEDED FOR WHEEZE OR SHORTNESS OF BREATH 18 each 2   atorvastatin  (LIPITOR) 80 MG tablet Take 1 tablet (80 mg total) by mouth daily. 90 tablet 3   botulinum toxin Type A (BOTOX) 200 units injection Inject into the muscle.     carbamide peroxide (DEBROX) 6.5 % OTIC solution Place 5 drops into the right ear 2 (two) times daily. 15 mL 0   chlorpheniramine-HYDROcodone  (TUSSIONEX) 10-8 MG/5ML Take 5 mLs by mouth every 12 (twelve) hours as needed. 115 mL 0   clotrimazole (LOTRIMIN) 1 % cream Apply 1 Application topically 2 (two) times daily. 30 g 2   fluticasone  (FLONASE ) 50 MCG/ACT nasal spray Place 2 sprays into both nostrils daily. 16 g 6   Fluticasone -Umeclidin-Vilant (TRELEGY ELLIPTA ) 100-62.5-25 MCG/ACT AEPB Inhale 1 puff into the lungs daily. 90 each 1   hydrochlorothiazide   (HYDRODIURIL ) 12.5 MG tablet Take 1 tablet (12.5 mg total) by mouth daily. 90 tablet 1   lacosamide (VIMPAT) 50 MG TABS tablet Take 1 tab qAM and 2 tab qhs     losartan  (COZAAR ) 100 MG tablet Take 1 tablet (100 mg total) by mouth daily. 90 tablet 1   omeprazole  (PRILOSEC) 20 MG capsule Take 1 capsule (20 mg total) by mouth daily. 90 capsule 3   prednisoLONE acetate (PRED FORTE) 1 % ophthalmic suspension 1 drop daily.     pregabalin  (LYRICA ) 300 MG capsule Take 1 capsule (300 mg total) by mouth 2 (two) times daily. 180 capsule 1   Semaglutide ,0.25 or 0.5MG /DOS, (OZEMPIC , 0.25 OR 0.5 MG/DOSE,) 2 MG/1.5ML SOPN Inject 0.5 mg into the skin once a week. 4.5 mL 3   No current facility-administered medications for this visit.    Patient confirms/reports the following allergies:  Allergies  Allergen Reactions   Lisinopril Cough   Lovastatin Other (See Comments)    Myalgia   Shellfish Allergy Rash and Dermatitis    No orders of the defined types were placed in this encounter.   AUTHORIZATION INFORMATION Primary Insurance: 1D#: Group #:  Secondary Insurance: 1D#: Group #:  SCHEDULE INFORMATION: Date: 07/11/24 Time: Location: [facility]

## 2024-05-03 ENCOUNTER — Other Ambulatory Visit: Payer: Self-pay | Admitting: Family Medicine

## 2024-05-03 ENCOUNTER — Other Ambulatory Visit: Payer: Self-pay

## 2024-05-03 DIAGNOSIS — E1169 Type 2 diabetes mellitus with other specified complication: Secondary | ICD-10-CM

## 2024-05-03 LAB — OPHTHALMOLOGY REPORT-SCANNED

## 2024-05-03 NOTE — Telephone Encounter (Signed)
 Copied from CRM #[id number]. Topic: Clinical - Medication Refill >> May 03, 2024 11:27 AM Leonette P wrote: Medication: Pregablin 300 mg 2 times a day  Has the patient contacted their pharmacy? Yes  This is the patient's preferred pharmacy:  [facility] - [place], [place] - [address] [address] Phone: [phone] Fax: [phone]  Is this the correct pharmacy for this prescription? Yes If no, delete pharmacy and type the correct one.   Has the prescription been filled recently? Yes  Is the patient out of the medication? No  Has the patient been seen for an appointment in the last year OR does the patient have an upcoming appointment? Yes  Can we respond through MyChart? No  Agent: Please be advised that Rx refills may take up to 3 business days. We ask that you follow-up with your pharmacy.

## 2024-05-03 NOTE — Telephone Encounter (Unsigned)
 Copied from CRM [id number]. Topic: Clinical - Medical Advice >> May 03, 2024 11:29 AM Leonette SQUIBB wrote: Reason for CRM: patient has not been taking her Ozempic  because she ran out and has not received any from the [place] pharmacy.  She wanys to know if Dr. B still wants her to continue to take it.  If she does she needs and refill.  Please advise  [phone]

## 2024-05-05 NOTE — Telephone Encounter (Signed)
 Requested medications are due for refill today.  yes  Requested medications are on the active medications list.  yes  Last refill. 10/19/2023 #180 1 rf  Future visit scheduled.   yes  Notes to clinic.  Refill not delegated.    Requested Prescriptions  Pending Prescriptions Disp Refills   pregabalin  (LYRICA ) 300 MG capsule 180 capsule 1    Sig: Take 1 capsule (300 mg total) by mouth 2 (two) times daily.     Not Delegated - Neurology:  Anticonvulsants - Controlled - pregabalin  Failed - 05/05/2024  5:10 PM      Failed - This refill cannot be delegated      Failed - Cr in normal range and within 360 days    Creat  Date Value Ref Range Status  03/26/2017 0.96 0.50 - 0.99 mg/dL Final    Comment:    For patients >66 years of age, the reference limit for Creatinine is approximately 13% higher for people identified as African-American. .    Creatinine, Ser  Date Value Ref Range Status  04/21/2024 1.09 (H) 0.57 - 1.00 mg/dL Final         Passed - Completed PHQ-2 or PHQ-9 in the last 360 days      Passed - Valid encounter within last 12 months    Recent Outpatient Visits           2 weeks ago Encounter for annual wellness visit (AWV) in Medicare patient   [facility] [place], Jon HERO, MD   3 months ago Ataxia   [facility] [place] Nancyann BRAVO, MD   6 months ago Type 2 diabetes mellitus with other specified complication, without long-term current use of insulin  [facility])   [place] [place], Jon HERO, MD

## 2024-05-06 MED ORDER — PREGABALIN 300 MG PO CAPS: 300.0000 mg | ORAL_CAPSULE | Freq: Two times a day (BID) | ORAL | 1 refills | Status: AC

## 2024-07-05 ENCOUNTER — Encounter: Payer: Self-pay | Admitting: Gastroenterology

## 2024-07-07 NOTE — Anesthesia Preprocedure Evaluation (Signed)
"                                    Anesthesia Evaluation  Patient identified by MRN, date of birth, ID band Patient awake    Reviewed: Allergy & Precautions, NPO status , Patient's Chart, lab work & pertinent test results  History of Anesthesia Complications Negative for: history of anesthetic complications  Airway Mallampati: III   Neck ROM: Full    Dental  (+) Upper Dentures, Caps, Missing   Pulmonary asthma    Pulmonary exam normal breath sounds clear to auscultation       Cardiovascular hypertension, Normal cardiovascular exam Rhythm:Regular Rate:Normal  ECG 11/11/22: normal   Neuro/Psych  PSYCHIATRIC DISORDERS Anxiety Depression    negative neurological ROS     GI/Hepatic ,GERD  ,,  Endo/Other  diabetes, Type 2  Class 3 obesity  Renal/GU Renal disease (stage III CKD)     Musculoskeletal  (+) Arthritis ,    Abdominal   Peds  Hematology negative hematology ROS (+)   Anesthesia Other Findings   Reproductive/Obstetrics                              Anesthesia Physical Anesthesia Plan  ASA: 3  Anesthesia Plan: General   Post-op Pain Management:    Induction: Intravenous  PONV Risk Score and Plan: 3 and Propofol  infusion, TIVA and Treatment may vary due to age or medical condition  Airway Management Planned: Natural Airway  Additional Equipment:   Intra-op Plan:   Post-operative Plan:   Informed Consent: I have reviewed the patients History and Physical, chart, labs and discussed the procedure including the risks, benefits and alternatives for the proposed anesthesia with the patient or authorized representative who has indicated his/her understanding and acceptance.       Plan Discussed with: CRNA  Anesthesia Plan Comments: (LMA/GETA backup discussed.  Patient consented for risks of anesthesia including but not limited to:  - adverse reactions to medications - damage to eyes, teeth, lips or other oral  mucosa - nerve damage due to positioning  - sore throat or hoarseness - damage to heart, brain, nerves, lungs, other parts of body or loss of life  Informed patient about role of CRNA in peri- and intra-operative care.  Patient voiced understanding.)         Anesthesia Quick Evaluation  "

## 2024-07-07 NOTE — H&P (Signed)
 "  Clotilda Schaffer, MD [facility] [address] [place], [place] [address] [phone] Fax : [phone]  Primary Care Physician:  Myrla Jon HERO, MD Primary Gastroenterologist:  Dr. Schaffer  Pre-Procedure History & Physical: HPI:  [name] is a 74 y.o. female is here for an colonoscopy as surveillance of a cecal AP in 2020.  Prior procedure? Fhx CRC? Blood thinners?   Past Medical History:  Diagnosis Date   Anxiety    Arthritis    Asthma    Bronchitis 09/04/2016   urgent care   Diabetes mellitus without complication (HCC)    Elevated C-reactive protein (CRP) 10/06/2016   GERD (gastroesophageal reflux disease)    History of shingles    Hyperlipidemia    Hypertension    Neuralgia    Right side of head and face, s/p shingles   Shingles    chronic on Rt side of head and face   Wears dentures    partial upper    Past Surgical History:  Procedure Laterality Date   ABDOMINAL HYSTERECTOMY     BREAST BIOPSY Right 2016   benign- tophat clip   BREAST BIOPSY Left 04/08/2016   -benign-fibroadenomatous changes- tophat clip   BREAST LUMPECTOMY Right 03/2016   BREAST LUMPECTOMY WITH NEEDLE LOCALIZATION Right 05/02/2016   Procedure: BREAST LUMPECTOMY WITH NEEDLE LOCALIZATION;  Surgeon: Carlin Pastel, MD;  Location: [facility];  Service: General;  Laterality: Right;   CATARACT EXTRACTION W/PHACO Right 07/14/2022   Procedure: CATARACT EXTRACTION PHACO AND INTRAOCULAR LENS PLACEMENT (IOC) RIGHT DIABETIC  6.26  00:40.3;  Surgeon: Myrna Adine Anes, MD;  Location: [facility];  Service: Ophthalmology;  Laterality: Right;   CATARACT EXTRACTION W/PHACO Left 07/28/2022   Procedure: CATARACT EXTRACTION PHACO AND INTRAOCULAR LENS PLACEMENT (IOC) LEFT DIABETIC;  Surgeon: Myrna Adine Anes, MD;  Location: [facility];  Service: Ophthalmology;  Laterality: Left;  5.72 0:40.7   CHOLECYSTECTOMY     COLONOSCOPY WITH PROPOFOL  N/A 05/02/2019   Procedure:  COLONOSCOPY WITH BIOPSY;  Surgeon: Jinny Carmine, MD;  Location: [facility];  Service: Endoscopy;  Laterality: N/A;  diabetic - diet controlled   KNEE ARTHROPLASTY Left 12/01/2016   Procedure: COMPUTER ASSISTED TOTAL KNEE ARTHROPLASTY;  Surgeon: Mardee Lynwood SQUIBB, MD;  Location: [facility];  Service: Orthopedics;  Laterality: Left;   POLYPECTOMY N/A 05/02/2019   Procedure: POLYPECTOMY;  Surgeon: Jinny Carmine, MD;  Location: [facility];  Service: Endoscopy;  Laterality: N/A;   TOTAL KNEE ARTHROPLASTY Right 2014   TOTAL KNEE ARTHROPLASTY Right 02/16/2013    Prior to Admission medications  Medication Sig Start Date End Date Taking? Authorizing Provider  albuterol  (VENTOLIN  HFA) 108 (90 Base) MCG/ACT inhaler INHALE 1-2 PUFFS BY MOUTH EVERY 6 HOURS AS NEEDED FOR WHEEZE OR SHORTNESS OF BREATH 10/21/23   Bacigalupo, Jon HERO, MD  atorvastatin  (LIPITOR) 80 MG tablet Take 1 tablet (80 mg total) by mouth daily. 10/19/23   Bacigalupo, Angela M, MD  botulinum toxin Type A (BOTOX) 200 units injection Inject into the muscle. Patient not taking: Reported on 07/05/2024 07/22/22   [provider]  carbamide peroxide (DEBROX) 6.5 % OTIC solution Place 5 drops into the right ear 2 (two) times daily. 10/19/23   Myrla Jon HERO, MD  chlorpheniramine-HYDROcodone  (TUSSIONEX) 10-8 MG/5ML Take 5 mLs by mouth every 12 (twelve) hours as needed. Patient not taking: Reported on 07/05/2024 07/20/23   Brimage, Vondra, DO  clotrimazole  (LOTRIMIN ) 1 % cream Apply 1 Application topically 2 (two) times daily. 04/21/24  Bacigalupo, Angela M, MD  fluticasone  (FLONASE ) 50 MCG/ACT nasal spray Place 2 sprays into both nostrils daily. Patient not taking: Reported on 07/05/2024 07/10/22   Simmons-Robinson, Rockie, MD  Fluticasone -Umeclidin-Vilant (TRELEGY ELLIPTA ) 100-62.5-25 MCG/ACT AEPB Inhale 1 puff into the lungs daily. 10/19/23   Myrla Jon HERO, MD  hydrochlorothiazide  (HYDRODIURIL ) 12.5 MG tablet Take 1 tablet  (12.5 mg total) by mouth daily. 10/19/23   Bacigalupo, Angela M, MD  lacosamide (VIMPAT) 50 MG TABS tablet Take 1 tab qAM and 2 tab qhs Patient not taking: Reported on 07/05/2024    [provider]  losartan  (COZAAR ) 100 MG tablet Take 1 tablet (100 mg total) by mouth daily. 10/19/23   Myrla Jon HERO, MD  omeprazole  (PRILOSEC) 20 MG capsule Take 1 capsule (20 mg total) by mouth daily. 10/19/23   Bacigalupo, Angela M, MD  prednisoLONE acetate (PRED FORTE) 1 % ophthalmic suspension 1 drop daily.    [provider]  pregabalin  (LYRICA ) 300 MG capsule Take 1 capsule (300 mg total) by mouth 2 (two) times daily. 05/06/24   Franchot Isaiah LABOR, MD  Semaglutide ,0.25 or 0.5MG /DOS, (OZEMPIC , 0.25 OR 0.5 MG/DOSE,) 2 MG/1.5ML SOPN Inject 0.5 mg into the skin once a week. Patient not taking: Reported on 07/05/2024 04/21/24   Myrla Jon HERO, MD    Allergies as of 04/26/2024 - Review Complete 04/21/2024  Allergen Reaction Noted   Lisinopril Cough 10/31/2014   Lovastatin Other (See Comments) 10/31/2014   Shellfish allergy Rash and Dermatitis 04/02/2016    Family History  Problem Relation Age of Onset   Anuerysm Mother        Brain   Dementia Mother    Diabetes Mother    Heart failure Father    CAD Father    Dementia Father    Diabetes Father    Lupus Sister    Throat cancer Maternal Grandfather    Diabetes Brother    Breast cancer Neg Hx     Social History   Socioeconomic History   Marital status: Widowed    Spouse name: Not on file   Number of children: 2   Years of education: Not on file   Highest education level: High school graduate  Occupational History   Occupation: retired  Tobacco Use   Smoking status: Never   Smokeless tobacco: Never  Vaping Use   Vaping status: Never Used  Substance and Sexual Activity   Alcohol  use: No    Alcohol /week: 0.0 standard drinks of alcohol    Drug use: No   Sexual activity: Not on file  Other Topics Concern   Not on file   Social History Narrative   Not on file   Social Drivers of Health   Tobacco Use: Low Risk (07/05/2024)   Patient History    Smoking Tobacco Use: Never    Smokeless Tobacco Use: Never    Passive Exposure: Not on file  Financial Resource Strain: Low Risk (07/10/2022)   Overall Financial Resource Strain (CARDIA)    Difficulty of Paying Living Expenses: Not hard at all  Food Insecurity: No Food Insecurity (04/21/2024)   Epic    Worried About Programme Researcher, Broadcasting/film/video in the Last Year: Never true    Ran Out of Food in the Last Year: Never true  Transportation Needs: No Transportation Needs (04/21/2024)   Epic    Lack of Transportation (Medical): No    Lack of Transportation (Non-Medical): No  Physical Activity: Insufficiently Active (04/21/2024)   Exercise Vital Sign  Days of Exercise per Week: 2 days    Minutes of Exercise per Session: 30 min  Stress: Stress Concern Present (04/21/2024)   [employer] of Occupational Health - Occupational Stress Questionnaire    Feeling of Stress: To some extent  Social Connections: Moderately Integrated (04/21/2024)   Social Connection and Isolation Panel    Frequency of Communication with Friends and Family: More than three times a week    Frequency of Social Gatherings with Friends and Family: More than three times a week    Attends Religious Services: More than 4 times per year    Active Member of [employer] or Organizations: Yes    Attends Banker Meetings: More than 4 times per year    Marital Status: Widowed  Intimate Partner Violence: Not At Risk (04/21/2024)   Epic    Fear of Current or Ex-Partner: No    Emotionally Abused: No    Physically Abused: No    Sexually Abused: No  Depression (PHQ2-9): Low Risk (04/21/2024)   Depression (PHQ2-9)    PHQ-2 Score: 0  Alcohol  Screen: Low Risk (07/10/2022)   Alcohol  Screen    Last Alcohol  Screening Score (AUDIT): 0  Housing: Low Risk (04/21/2024)   Epic    Unable to Pay for Housing in the  Last Year: No    Number of Times Moved in the Last Year: 0    Homeless in the Last Year: No  Utilities: Not At Risk (04/21/2024)   Epic    Threatened with loss of utilities: No  Health Literacy: Adequate Health Literacy (04/21/2024)   B1300 Health Literacy    Frequency of need for help with medical instructions: Never    Review of Systems: See HPI, otherwise negative ROS  Physical Exam: Ht 5' 2 (1.575 m)   Wt 100.2 kg   BMI 40.42 kg/m  CONSTITUTIONAL: Well-appearing in no acute distress.  HEENT: Pupils equal, round, Extraocular movements intact. Conjunctivae clear NECK: Neck supple CARDIOVASCULAR: Regular rate, no LE edema  RESPIRATORY: No labored breathing  ABDOMEN: Abdomen soft, nontender, not distended, no guarding, no rigidity SKIN: No apparent skin rashes or lesions. NEUROLOGIC: Normal speech, no focal findings. Mental status alert and oriented x4. PSYCHIATRIC: Mood and affect normal.   Impression/Plan: [name] is here for an colonoscopy to be performed for as surveillance of a cecal AP in 2020.  Risks, benefits, limitations, and alternatives regarding  colonoscopy have been reviewed with the patient.  Questions have been answered.  All parties agreeable.   Sarah CLOTILDA CHRISTELLA, MD  07/07/2024, 3:47 PM  "

## 2024-07-08 ENCOUNTER — Encounter: Payer: Self-pay | Admitting: Gastroenterology

## 2024-07-08 ENCOUNTER — Ambulatory Visit
Admission: RE | Admit: 2024-07-08 | Discharge: 2024-07-08 | Disposition: A | Discharge status: Home / Self Care | Attending: Gastroenterology | Admitting: Gastroenterology

## 2024-07-08 ENCOUNTER — Ambulatory Visit: Payer: Self-pay | Admitting: Anesthesiology

## 2024-07-08 ENCOUNTER — Encounter
Admission: RE | Disposition: A | Discharge status: Home / Self Care | Payer: Self-pay | Source: Home / Self Care | Attending: Gastroenterology

## 2024-07-08 ENCOUNTER — Other Ambulatory Visit: Payer: Self-pay

## 2024-07-08 DIAGNOSIS — E66813 Obesity, class 3: Secondary | ICD-10-CM | POA: Diagnosis not present

## 2024-07-08 DIAGNOSIS — K5730 Diverticulosis of large intestine without perforation or abscess without bleeding: Secondary | ICD-10-CM | POA: Insufficient documentation

## 2024-07-08 DIAGNOSIS — Z860100 Personal history of colon polyps, unspecified: Secondary | ICD-10-CM

## 2024-07-08 DIAGNOSIS — Z1211 Encounter for screening for malignant neoplasm of colon: Secondary | ICD-10-CM | POA: Diagnosis present

## 2024-07-08 DIAGNOSIS — Z6841 Body Mass Index (BMI) 40.0 and over, adult: Secondary | ICD-10-CM | POA: Insufficient documentation

## 2024-07-08 DIAGNOSIS — E1122 Type 2 diabetes mellitus with diabetic chronic kidney disease: Secondary | ICD-10-CM | POA: Diagnosis not present

## 2024-07-08 DIAGNOSIS — I129 Hypertensive chronic kidney disease with stage 1 through stage 4 chronic kidney disease, or unspecified chronic kidney disease: Secondary | ICD-10-CM | POA: Insufficient documentation

## 2024-07-08 DIAGNOSIS — D122 Benign neoplasm of ascending colon: Secondary | ICD-10-CM | POA: Diagnosis not present

## 2024-07-08 DIAGNOSIS — J45909 Unspecified asthma, uncomplicated: Secondary | ICD-10-CM | POA: Diagnosis not present

## 2024-07-08 DIAGNOSIS — K640 First degree hemorrhoids: Secondary | ICD-10-CM | POA: Diagnosis not present

## 2024-07-08 DIAGNOSIS — M1990 Unspecified osteoarthritis, unspecified site: Secondary | ICD-10-CM | POA: Diagnosis not present

## 2024-07-08 DIAGNOSIS — N183 Chronic kidney disease, stage 3 unspecified: Secondary | ICD-10-CM | POA: Insufficient documentation

## 2024-07-08 LAB — GLUCOSE, CAPILLARY: Glucose-Capillary: 127 mg/dL — ABNORMAL HIGH (ref 70–99)

## 2024-07-08 MED ORDER — PROPOFOL 1000 MG/100ML IV EMUL
INTRAVENOUS | Status: AC
  Filled 2024-07-08: qty 100

## 2024-07-08 MED ORDER — LACTATED RINGERS IV SOLN: INTRAVENOUS | Status: DC

## 2024-07-08 MED ORDER — ONDANSETRON HCL 4 MG/2ML IJ SOLN: 4.0000 mg | Freq: Once | INTRAMUSCULAR | Status: DC | PRN

## 2024-07-08 MED ORDER — LIDOCAINE HCL (CARDIAC) PF 100 MG/5ML IV SOSY
PREFILLED_SYRINGE | INTRAVENOUS | Status: DC | PRN
  Administered 2024-07-08: 20 mg via INTRAVENOUS

## 2024-07-08 MED ORDER — SODIUM CHLORIDE 0.9 % IV SOLN: INTRAVENOUS | Status: DC

## 2024-07-08 MED ORDER — STERILE WATER FOR IRRIGATION IR SOLN
Status: DC | PRN
  Administered 2024-07-08: 250 mL

## 2024-07-08 MED ORDER — PROPOFOL 10 MG/ML IV BOLUS
INTRAVENOUS | Status: DC | PRN
  Administered 2024-07-08: 30 mg via INTRAVENOUS
  Administered 2024-07-08: 20 mg via INTRAVENOUS
  Administered 2024-07-08 (×2): 30 mg via INTRAVENOUS
  Administered 2024-07-08: 100 mg via INTRAVENOUS
  Administered 2024-07-08: 20 mg via INTRAVENOUS

## 2024-07-08 NOTE — Anesthesia Postprocedure Evaluation (Signed)
"   Anesthesia Post Note  Patient: [name]  Procedure(s) Performed: COLONOSCOPY (Rectum) POLYPECTOMY, INTESTINE (Rectum)  Patient location during evaluation: PACU Anesthesia Type: General Level of consciousness: awake and alert, oriented and patient cooperative Pain management: pain level controlled Vital Signs Assessment: post-procedure vital signs reviewed and stable Respiratory status: spontaneous breathing, nonlabored ventilation and respiratory function stable Cardiovascular status: blood pressure returned to baseline and stable Postop Assessment: adequate PO intake Anesthetic complications: no   No notable events documented.   Last Vitals:  Vitals:   07/08/24 0917 07/08/24 0926  BP: 110/61 105/79  Pulse: 94 93  Resp: 17 20  Temp: 36.6 C   SpO2: 94% 98%    Last Pain:  Vitals:   07/08/24 0926  TempSrc:   PainSc: 0-No pain                 Alfonso Ruths      "

## 2024-07-08 NOTE — Transfer of Care (Signed)
 Immediate Anesthesia Transfer of Care Note  Patient: [name]  Procedure(s) Performed: COLONOSCOPY (Rectum) POLYPECTOMY, INTESTINE (Rectum)  Patient Location: PACU  Anesthesia Type: General  Level of Consciousness: awake, alert  and patient cooperative  Airway and Oxygen Therapy: Patient Spontanous Breathing and Patient connected to supplemental oxygen  Post-op Assessment: Post-op Vital signs reviewed, Patient's Cardiovascular Status Stable, Respiratory Function Stable, Patent Airway and No signs of Nausea or vomiting  Post-op Vital Signs: Reviewed and stable  Complications: No notable events documented.

## 2024-07-08 NOTE — Op Note (Signed)
 [facility] Gastroenterology Patient Name: [name] Procedure Date: 07/08/2024 8:53 AM MRN: [id number] Account #: [id number] Date of Birth: 12/27/1950 Admit Type: Outpatient Age: 74 Room: [facility] OR ROOM 01 Gender: Female Note Status: Finalized Instrument Name: Peds Colonoscope [id number] Procedure:             Colonoscopy Indications:           High risk colon cancer surveillance: Personal history                         of colonic polyps Providers:             Clotilda Schaffer, MD Referring MD:          Jon HERO. Bacigalupo (Referring MD) Medicines:             Propofol  per Anesthesia Complications:         No immediate complications. Procedure:             Pre-Anesthesia Assessment:                        - Prior to the procedure, a History and Physical was                         performed, and patient medications and allergies were                         reviewed. The patient's tolerance of previous                         anesthesia was also reviewed. The risks and benefits                         of the procedure and the sedation options and risks                         were discussed with the patient. All questions were                         answered, and informed consent was obtained. Prior                         Anticoagulants: The patient has taken no anticoagulant                         or antiplatelet agents. ASA Grade Assessment: III - A                         patient with severe systemic disease. After reviewing                         the risks and benefits, the patient was deemed in                         satisfactory condition to undergo the procedure.                        After obtaining informed consent, the colonoscope was  passed under direct vision. Throughout the procedure,                         the patient's blood pressure, pulse, and oxygen                         saturations were monitored continuously. The                          Colonoscope was introduced through the anus and                         advanced to the the cecum, identified by appendiceal                         orifice and ileocecal valve. The colonoscopy was                         performed without difficulty. The patient tolerated                         the procedure well. The quality of the bowel                         preparation was good. The ileocecal valve, appendiceal                         orifice, and rectum were photographed. Findings:      Two sessile polyps were found in the ascending colon. The polyps were 4       to 6 mm in size. These polyps were removed with a cold snare. Resection       and retrieval were complete. Estimated blood loss: none.      A few medium-mouthed diverticula were found in the ascending colon.      Internal hemorrhoids were found during retroflexion. The hemorrhoids       were Grade I (internal hemorrhoids that do not prolapse). Impression:            - Two 4 to 6 mm polyps in the ascending colon, removed                         with a cold snare. Resected and retrieved.                        - Diverticulosis in the ascending colon.                        - Internal hemorrhoids. Recommendation:        - Patient has a contact number available for                         emergencies. The signs and symptoms of potential                         delayed complications were discussed with the patient.                         Return to normal activities tomorrow. Written  discharge instructions were provided to the patient.                        - High fiber diet.                        - Continue present medications.                        - Await pathology results.                        - Repeat colonoscopy in 5-10 years for surveillance                         based on pathology results.                        - The findings and recommendations were discussed  with                         the designated responsible adult. Procedure Code(s):     --- Professional ---                        [id number], Colonoscopy, flexible; with removal of                         tumor(s), polyp(s), or other lesion(s) by snare                         technique Diagnosis Code(s):     --- Professional ---                        Z86.010, Personal history of colonic polyps                        D12.2, Benign neoplasm of ascending colon                        K64.0, First degree hemorrhoids                        K57.30, Diverticulosis of large intestine without                         perforation or abscess without bleeding CPT copyright 2022 American Medical Association. All rights reserved. The codes documented in this report are preliminary and upon coder review may  be revised to meet current compliance requirements. Clotilda Schaffer, MD 07/08/2024 9:19:15 AM Number of Addenda: 0 Note Initiated On: 07/08/2024 8:53 AM Scope Withdrawal Time: 0 hours 10 minutes 35 seconds  Total Procedure Duration: 0 hours 11 minutes 53 seconds  Estimated Blood Loss:  Estimated blood loss: none.      [facility]

## 2024-07-12 ENCOUNTER — Ambulatory Visit: Payer: Self-pay | Admitting: Gastroenterology

## 2024-07-12 LAB — SURGICAL PATHOLOGY

## 2024-10-20 ENCOUNTER — Ambulatory Visit: Admitting: Family Medicine
